# Patient Record
Sex: Male | Born: 1970 | Race: White | Hispanic: No | Marital: Married | Smoking: Never smoker
Health system: Southern US, Community
[De-identification: ages and names within clinical notes are randomized; demographics above are authoritative.]

## PROBLEM LIST (undated history)

## (undated) DIAGNOSIS — C801 Malignant (primary) neoplasm, unspecified: Secondary | ICD-10-CM

## (undated) DIAGNOSIS — E119 Type 2 diabetes mellitus without complications: Secondary | ICD-10-CM

## (undated) HISTORY — DX: Malignant (primary) neoplasm, unspecified: C80.1

## (undated) HISTORY — PX: OTHER SURGICAL HISTORY: SHX169

---

## 2013-04-28 ENCOUNTER — Ambulatory Visit: Payer: Self-pay

## 2013-05-05 ENCOUNTER — Ambulatory Visit: Payer: Self-pay

## 2013-05-12 ENCOUNTER — Ambulatory Visit: Payer: Self-pay

## 2014-03-09 ENCOUNTER — Ambulatory Visit: Payer: Self-pay

## 2014-03-30 ENCOUNTER — Ambulatory Visit: Payer: BC Managed Care – PPO

## 2014-04-06 ENCOUNTER — Ambulatory Visit: Payer: BC Managed Care – PPO

## 2014-04-13 ENCOUNTER — Ambulatory Visit: Payer: BC Managed Care – PPO

## 2017-09-03 ENCOUNTER — Ambulatory Visit: Payer: Self-pay

## 2017-09-26 ENCOUNTER — Ambulatory Visit: Payer: Self-pay

## 2017-10-03 ENCOUNTER — Ambulatory Visit: Payer: Self-pay

## 2017-10-10 ENCOUNTER — Ambulatory Visit: Payer: Self-pay

## 2018-04-18 ENCOUNTER — Encounter: Payer: Self-pay | Admitting: Otolaryngology

## 2018-04-18 ENCOUNTER — Other Ambulatory Visit: Payer: Self-pay | Admitting: Otolaryngology

## 2018-04-18 DIAGNOSIS — R590 Localized enlarged lymph nodes: Secondary | ICD-10-CM

## 2018-04-18 DIAGNOSIS — R599 Enlarged lymph nodes, unspecified: Secondary | ICD-10-CM

## 2018-05-14 DIAGNOSIS — C760 Malignant neoplasm of head, face and neck: Secondary | ICD-10-CM | POA: Insufficient documentation

## 2018-05-16 ENCOUNTER — Other Ambulatory Visit: Payer: Self-pay | Admitting: Otolaryngology

## 2018-05-16 ENCOUNTER — Other Ambulatory Visit (HOSPITAL_COMMUNITY): Payer: Self-pay | Admitting: Otolaryngology

## 2018-05-16 DIAGNOSIS — R590 Localized enlarged lymph nodes: Secondary | ICD-10-CM

## 2018-05-16 DIAGNOSIS — C770 Secondary and unspecified malignant neoplasm of lymph nodes of head, face and neck: Secondary | ICD-10-CM

## 2018-05-19 ENCOUNTER — Encounter: Payer: Self-pay | Admitting: *Deleted

## 2018-05-23 ENCOUNTER — Telehealth: Payer: Self-pay | Admitting: *Deleted

## 2018-05-23 ENCOUNTER — Ambulatory Visit
Admission: RE | Admit: 2018-05-23 | Discharge: 2018-05-23 | Disposition: A | Discharge status: Home / Self Care | Payer: Self-pay | Source: Ambulatory Visit | Attending: Radiation Oncology | Admitting: Radiation Oncology

## 2018-05-23 DIAGNOSIS — C779 Secondary and unspecified malignant neoplasm of lymph node, unspecified: Secondary | ICD-10-CM

## 2018-05-23 NOTE — Telephone Encounter (Signed)
Oncology Nurse Navigator Documentation  Faxed request to [facility] for the following imaging to be pushed to [employer], notification of successful fax transmission received. . 04/24/2018 CT Neck/Thyroid W Contrast Order placed for imaging to be assigned to [facility] timeline.  Gayleen Orem, RN, BSN Head & Neck Oncology Nurse [place] at [place] [phone]

## 2018-05-23 NOTE — Telephone Encounter (Signed)
Oncology Nurse Navigator Documentation  In follow-up to 3/2 referral received from ENT [name], placed navigator introductory call.  LVMMs for pt and wife requesting return call.  Gayleen Orem, RN, BSN Head & Neck Oncology Nurse [place] at [place] [phone]

## 2018-05-27 ENCOUNTER — Encounter: Payer: Self-pay | Admitting: Radiation Oncology

## 2018-05-27 NOTE — Progress Notes (Signed)
Head and Neck Cancer Location of Tumor / Histology:  RECEIVED: 05/07/2018  ORDERING PHYSICIAN: JEFRY H ROSEN , MD  PATIENT NAME: Albert Flowers, Albert Flowers  CYTOLOGY REPORT    Final Cytologic Interpretation  A. Right IJ chain Lymph node, Fine Needle Aspiration II (smears,  cell block):  Squamous cell carcinoma, metastatic.     Specimen Adequacy: Satisfactory for evaluation.  B. Cytology core biopsy:  Squamous cell carcinoma, metastatic. See comment.      Specimen Adequacy: Satisfactory for evaluation.    COMMENT:Immunohistochemical stains are performed on the core  biopsy. The neoplastic cells are positive for p63 and p16.   Patient presented months ago with symptoms of: He noted a right sided neck lump beginning early January.   FNA of Right IJ chain lymph node revealed: Squamous cell carcinoma  Nutrition Status Yes No Comments  Weight changes? []  [x]    Swallowing concerns? []  [x]    PEG? []  [x]     Referrals Yes No Comments  Social Work? []  [x]    Dentistry? [x]  []  Dr. Enrique Sack 05/29/18  Swallowing therapy? []  [x]    Nutrition? []  [x]    Med/Onc? [x]  []  Dr. Maylon Peppers 06/04/18   Safety Issues Yes No Comments  Prior radiation? []  [x]    Pacemaker/ICD? []  [x]    Possible current pregnancy? []  [x]    Is the patient on methotrexate? []  [x]     Tobacco/Marijuana/Snuff/ETOH use: He has never smoked. He drinks alcohol occasionally.   Past/Anticipated interventions by otolaryngology, if any:  05/15/18 Dr. Constance Holster Metastatic squamous cell lymph node. Recommend PET scan, evaluation at the cancer center by radiation and medical oncology and dental medicine. Depending on the findings of the PET scan, it is possible that robotic surgery might be indicated in which case we will send him over to [facility].  Electronically signed by: Beckie Salts, MD 05/15/18 0920   Past/Anticipated interventions by medical oncology, if any:  Dr. Maylon Peppers 06/04/18.    Current Complaints / other  details:   05/28/18 PET  BP 123/87 (BP Location: Left Arm, Patient Position: Sitting)   Pulse 76   Temp 98.1 F (36.7 C) (Oral)   Resp 18   Ht 5\' 10"  (1.778 m)   Wt 196 lb (88.9 kg)   SpO2 97%   BMI 28.12 kg/m    Wt Readings from Last 3 Encounters:  05/30/18 196 lb (88.9 kg)

## 2018-05-28 ENCOUNTER — Encounter (HOSPITAL_COMMUNITY): Payer: Self-pay

## 2018-05-28 ENCOUNTER — Encounter (HOSPITAL_COMMUNITY)
Admission: RE | Admit: 2018-05-28 | Discharge: 2018-05-28 | Disposition: A | Discharge status: Home / Self Care | Payer: Managed Care, Other (non HMO) | Source: Ambulatory Visit | Attending: Otolaryngology | Admitting: Otolaryngology

## 2018-05-28 ENCOUNTER — Other Ambulatory Visit: Payer: Self-pay

## 2018-05-28 DIAGNOSIS — R590 Localized enlarged lymph nodes: Secondary | ICD-10-CM

## 2018-05-28 DIAGNOSIS — C770 Secondary and unspecified malignant neoplasm of lymph nodes of head, face and neck: Secondary | ICD-10-CM | POA: Diagnosis present

## 2018-05-28 LAB — GLUCOSE, CAPILLARY: Glucose-Capillary: 162 mg/dL — ABNORMAL HIGH (ref 70–99)

## 2018-05-28 IMAGING — CT NUCLEAR MEDICINE PET IMAGE INITIAL (PI) SKULL BASE TO THIGH
7 series · 16 of 16 positions shown · non-contrast
Comparison: None.

CLINICAL DATA: Initial treatment strategy for head neck carcinoma.
Cervical lymphadenopathy. HPV positive squamous cell carcinoma.

EXAM:
NUCLEAR MEDICINE PET SKULL BASE TO THIGH
TECHNIQUE: 10.6 mCi F-18 FDG was injected intravenously. Full-ring PET imaging
was performed from the skull base to thigh after the radiotracer. CT
data was obtained and used for attenuation correction and anatomic
localization.
Fasting blood glucose: 162 mg/dl

[Series 3: pet hn_sk_thigh ac · axial · 5.0mm · 4.07mm/px · z∈[-818,+150]mm · 4 of 243 slices shown]
[im 1/243]
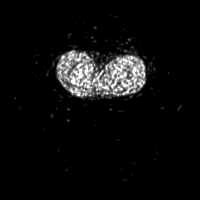
[im 81/243]
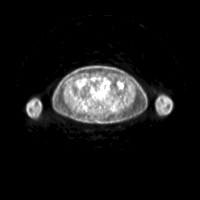
[im 162/243]
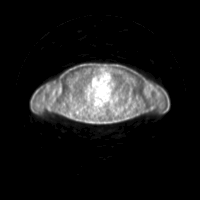
[im 243/243]
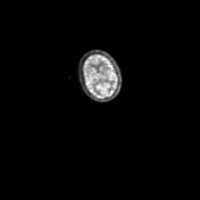

[Series 4: ct hn_sk_th 5.0 hd_fov · axial · 1.17mm/px · z∈[-818,+150]mm · 3 of 243 slices shown]
[im 1/243  soft-tissue]
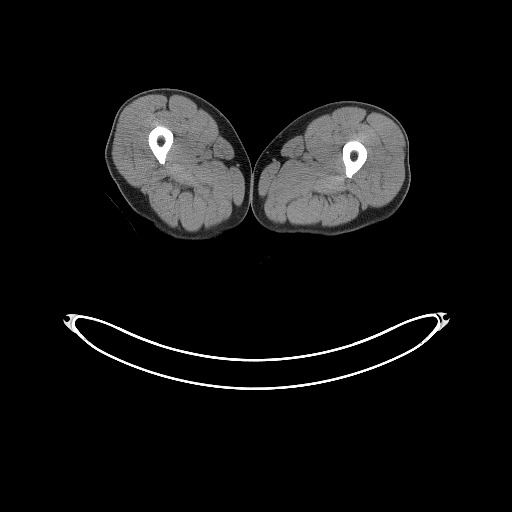
[im 122/243  soft-tissue]
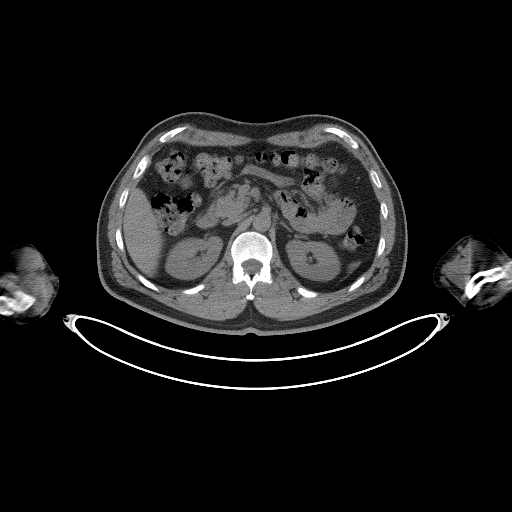
[im 243/243  soft-tissue]
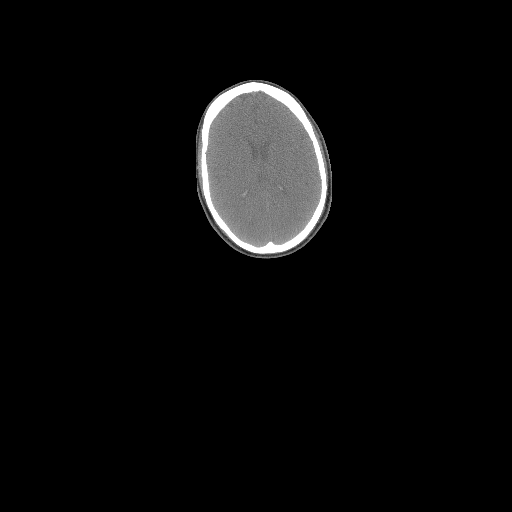

[Series 5: pet hn_sk_thigh nac · axial · 5.0mm · 4.07mm/px · z∈[-818,+150]mm · 3 of 243 slices shown]
[im 1/243]
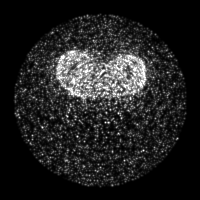
[im 122/243]
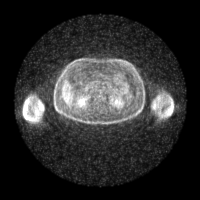
[im 243/243]
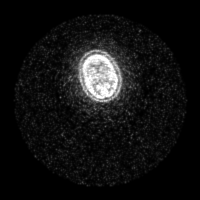

[Series 8: ct hn_sk_th 5.0 b70f lung_bone · axial · 0.66mm/px · 1 of 61 slices shown]
[im 1/61  soft-tissue]
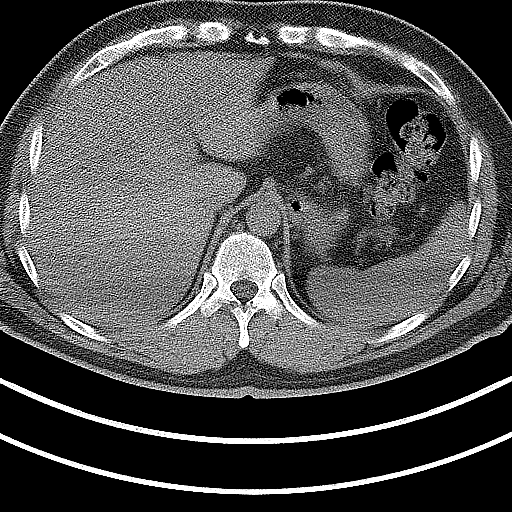

[Series 603: mip range · coronal · 2.01mm/px · 1 of 32 slices shown]
[im 1/32]
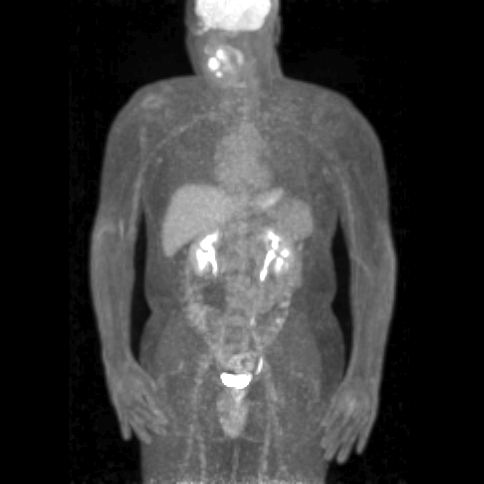

[Series 604: range-ct hn_sk_th 5.0 hd_fov-cor-<alpha range> · 1 of 78 slices shown]
[im 1/78]
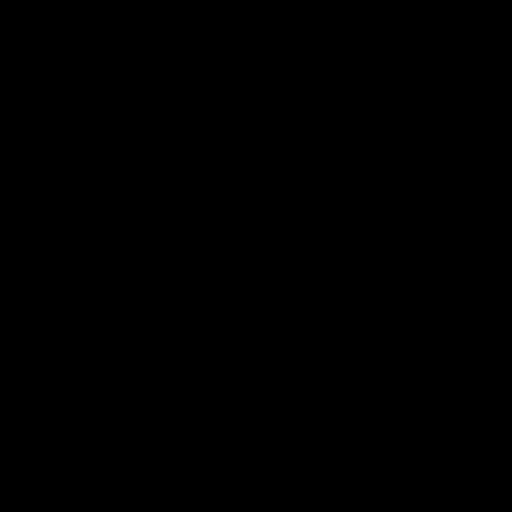

[Series 605: range-ct hn_sk_th 5.0 hd_fov-tra-<alpha range> · 3 of 232 slices shown]
[im 1/232]
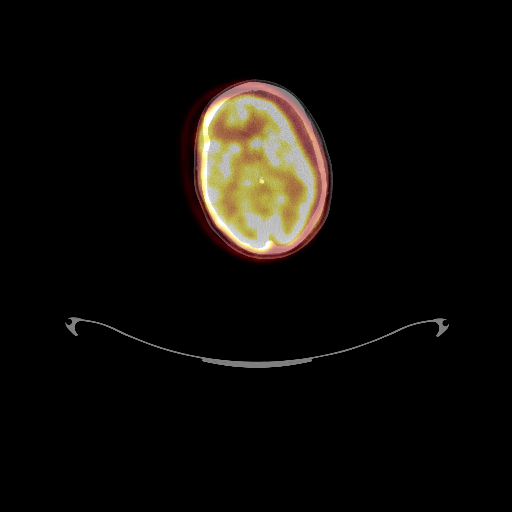
[im 116/232]
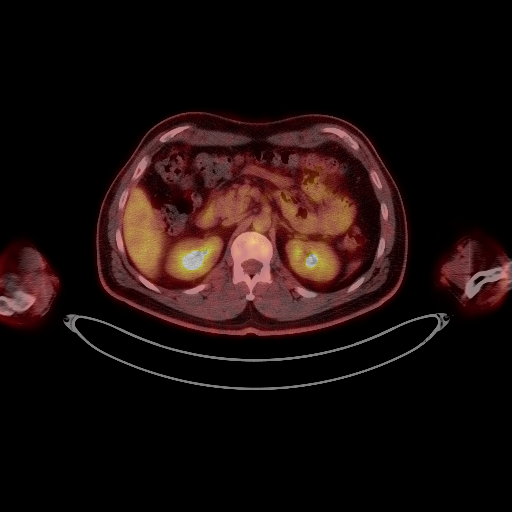
[im 232/232]
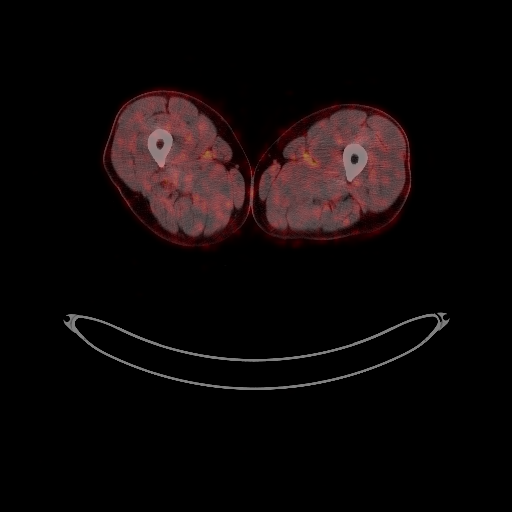

[16 of 16 positions shown; findings below may reference images not displayed]

FINDINGS: Mediastinal blood pool activity: SUV max

NECK: Head motion results in misregistration of the PET data and CT
data. Images adequate for diagnostic interpretation

Hypermetabolic activity (SUV max equal 9.2 in the hypopharynx
appears to localize to the RIGHT lingual tonsil/base of tongue.
Misregistration makes localization difficult. Activity appears
localized to the pharyngeal mucosa.

Mild metabolic activity at similar location in the LEFT hypopharynx
with with SUV max equal 4.6.

Hypermetabolic RIGHT level 2 lymph node anterior to
sternocleidomastoid muscle with SUV max equal 9.6 measures 19 mm
(image 19 mm image 33/4).

Second slightly inferior hypermetabolic RIGHT level 2 lymph node
measures 12 mm on image 37/4).

Incidental CT findings: none

CHEST: No hypermetabolic mediastinal or hilar nodes. No suspicious
pulmonary nodules on the CT scan.

Incidental CT findings: Calcified RIGHT hilar lymph node

ABDOMEN/PELVIS: No abnormal hypermetabolic activity within the
liver, pancreas, adrenal glands, or spleen. No hypermetabolic lymph
nodes in the abdomen or pelvis.

Incidental CT findings: Prostate normal.

SKELETON: No focal hypermetabolic activity to suggest skeletal
metastasis.

Incidental CT findings: none
IMPRESSION: 1. Asymmetric hypermetabolic activity in the RIGHT lingular
tonsil/base of tongue region favored primary carcinoma.
2. Two hypermetabolic RIGHT level II metastatic lymph nodes.
3. No LEFT cervical lymphadenopathy.  No distant metastatic disease.

## 2018-05-28 MED ORDER — FLUDEOXYGLUCOSE F - 18 (FDG) INJECTION
10.6000 | Freq: Once | INTRAVENOUS | Status: AC | PRN
  Administered 2018-05-28: 10.6 via INTRAVENOUS

## 2018-05-29 ENCOUNTER — Ambulatory Visit (HOSPITAL_COMMUNITY): Payer: Self-pay | Admitting: Dentistry

## 2018-05-29 ENCOUNTER — Encounter (HOSPITAL_COMMUNITY): Payer: Self-pay | Admitting: Dentistry

## 2018-05-29 VITALS — BP 113/77 | HR 59 | Temp 98.1°F

## 2018-05-29 DIAGNOSIS — M264 Malocclusion, unspecified: Secondary | ICD-10-CM

## 2018-05-29 DIAGNOSIS — Z01818 Encounter for other preprocedural examination: Secondary | ICD-10-CM

## 2018-05-29 DIAGNOSIS — K0530 Chronic periodontitis, unspecified: Secondary | ICD-10-CM

## 2018-05-29 DIAGNOSIS — K036 Deposits [accretions] on teeth: Secondary | ICD-10-CM

## 2018-05-29 DIAGNOSIS — K08409 Partial loss of teeth, unspecified cause, unspecified class: Secondary | ICD-10-CM

## 2018-05-29 DIAGNOSIS — C760 Malignant neoplasm of head, face and neck: Secondary | ICD-10-CM

## 2018-05-29 DIAGNOSIS — M2624 Reverse articulation: Secondary | ICD-10-CM

## 2018-05-29 DIAGNOSIS — K031 Abrasion of teeth: Secondary | ICD-10-CM

## 2018-05-29 DIAGNOSIS — K06010 Localized gingival recession, unspecified: Secondary | ICD-10-CM

## 2018-05-29 MED ORDER — SODIUM FLUORIDE 1.1 % DT CREA: TOPICAL_CREAM | DENTAL | 99 refills | Status: DC

## 2018-05-29 NOTE — Patient Instructions (Signed)

## 2018-05-29 NOTE — Progress Notes (Signed)
Radiation Oncology         [phone]) [phone] ________________________________  Initial outpatient Consultation  Name: Albert Flowers MRN: [id number]  Date: 05/30/2018  DOB: 1970-07-15  VO:JJKK, Edwyna Shell, MD  [name], MD   REFERRING PHYSICIAN: [name], MD  DIAGNOSIS:    ICD-10-CM   1. 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 male who presented with several months ago with right sided neck lump that he noted in early January 2020.  Subsequently, the patient saw Dr. Constance Holster who performed FNA and recommended the following: PET scan, radiation oncology consult, medical oncology consult, and dental medicine consult.   Fine needle aspiration on 05/07/2018 revealed: Right IJ chain lymph node, fine needle aspiration with squamous cell carcinoma, metastatic. Cytology or core biopsy with squamous cell carcinoma, metastatic. Immunochemical stains showed neoplastic cells position for p63 and p16.   Pertinent imaging thus far includes CT Neck with contrast performed on 04/24/2018 revealing: Right level 2 necrotic nodal mass and level 3 necrotic lymph node are highly concerning for nodal metastasis from head and neck primary neoplasm likely oropharynx or the oral cavity. Mild asymmetry and fullness of right nasopharyngeal region, nonspecific. 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   Additionally, he had a PET scan on 05/28/2018 showed: Asymmetric hypermetabolic activity in the RIGHT lingular  tonsil/base of tongue region favored primary carcinoma. Two hypermetabolic RIGHT level II metastatic lymph nodes. No LEFT cervical lymphadenopathy. No distant metastatic disease.  I have personally reviewed his imaging.  I also shared his imaging with Dr. [name] by video conferencing  Swallowing issues, if any: No Weight Changes: No  Tobacco history, if any: He has never smoked cigarettes  ETOH abuse, if any: He consumes alcohol occasionally.    PREVIOUS RADIATION THERAPY: No  PAST MEDICAL HISTORY:  has a past medical history of Diabetes mellitus without complication ([place]).    PAST SURGICAL HISTORY: Past Surgical History:  Procedure Laterality Date   neck sugery     as a child "had a knot removed from neck" not sure which side.     FAMILY HISTORY:  No related family history reported   SOCIAL HISTORY:  reports that he has never smoked. He has never used smokeless tobacco. He reports current alcohol use. He reports that he does not use drugs.  ALLERGIES: Penicillins  MEDICATIONS:  Current Outpatient Medications  Medication Sig Dispense Refill   aspirin EC 81 MG tablet Take 81 mg by mouth daily.     metFORMIN (GLUCOPHAGE) 1000 MG tablet Take by mouth.     rosuvastatin (CRESTOR) 20 MG tablet      sitaGLIPtin (JANUVIA) 100 MG tablet      sodium fluoride (PREVIDENT 5000 PLUS) 1.1 % CREA dental cream Apply to tooth brush. Brush teeth for 2 minutes. Spit out excess. DO NOT rinse afterwards. Repeat nightly. (Patient not taking: Reported on 05/30/2018) 1 Tube prn   No current facility-administered medications for this encounter.     REVIEW OF SYSTEMS:  A 10+ POINT REVIEW OF SYSTEMS WAS OBTAINED including neurology, dermatology, psychiatry, cardiac, respiratory, lymph, extremities, GI, GU, Musculoskeletal, constitutional, breasts, reproductive, HEENT.  All pertinent positives are noted in the HPI.  All others are negative.   PHYSICAL EXAM:  height is 5\' 10"  (1.778 m) and  weight is 196 lb (88.9 kg). His oral temperature is 98.1 F (36.7 C). His blood pressure is 123/87 and his pulse is 76. His respiration is 18 and oxygen saturation is 97%.   General: Alert and oriented, in no acute distress HEENT: Head is normocephalic. Extraocular movements are intact. Oropharynx is notable for no obvious lesions Neck: Neck is notable for palpable mass in level 2/3 of right neck, approximately 4 cm in greatest dimension heart: Regular in rate and rhythm with no murmurs, rubs, or gallops. Chest: Clear to auscultation bilaterally, with no rhonchi, wheezes, or rales. Abdomen: Soft, nontender, nondistended, with no rigidity or guarding. Extremities: No cyanosis or edema. Lymphatics: see Neck Exam Skin: No concerning lesions. Musculoskeletal: symmetric strength and muscle tone throughout. Neurologic: Cranial nerves II through XII are grossly intact. No obvious focalities. Speech is fluent. Coordination is intact. Psychiatric: Judgment and insight are intact. Affect is appropriate.  PROCEDURE NOTE: After obtaining consent and anesthetizing the nasal cavity with topical lidocaine and phenylephrine, the flexible endoscope was introduced and passed through the nasal cavity.  No obvious lesions or masses appreciated in the pharynx or the larynx.  His true cords are symmetrically mobile with no nodules.  ECOG = 0  0 - Asymptomatic (Fully active, able to carry on all predisease activities without restriction)  1 - Symptomatic but completely ambulatory (Restricted in physically strenuous activity but ambulatory and able to carry out work of a light or sedentary nature. For example, light housework, office work)  2 - Symptomatic, <50% in bed during the day (Ambulatory and capable of all self care but unable to carry out any work activities. Up and about more than 50% of waking hours)  3 - Symptomatic, >50% in bed, but not bedbound (Capable of only limited self-care, confined to bed or  chair 50% or more of waking hours)  4 - Bedbound (Completely disabled. Cannot carry on any self-care. Totally confined to bed or chair)  5 - Death   Eustace Pen MM, Creech RH, Tormey DC, et al. [phone]). "Toxicity and response criteria of the [facility]". [place] Oncol. 5 (6): 649-55   LABORATORY DATA:  No results found for: WBC, HGB, HCT, MCV, PLT CMP  No results found for: NA, K, CL, CO2, GLUCOSE, BUN, CREATININE, CALCIUM, PROT, ALBUMIN, AST, ALT, ALKPHOS, BILITOT, GFRNONAA, GFRAA    No results found for: TSH   RADIOGRAPHY: Nm Pet Image Initial (pi) Skull Base To Thigh  Result Date: 05/28/2018 CLINICAL DATA:  Initial treatment strategy for head neck carcinoma. Cervical lymphadenopathy. HPV positive squamous cell carcinoma. EXAM: NUCLEAR MEDICINE PET SKULL BASE TO THIGH TECHNIQUE: 10.6 mCi F-18 FDG was injected intravenously. Full-ring PET imaging was performed from the skull base to thigh after the radiotracer. CT data was obtained and used for attenuation correction and anatomic localization. Fasting blood glucose: 162 mg/dl COMPARISON:  None. FINDINGS: Mediastinal blood pool activity: SUV max 2.25 NECK: Head motion results in misregistration of the PET data and CT data. Images adequate for diagnostic interpretation Hypermetabolic activity (SUV max equal 9.2 in the hypopharynx appears to localize to the RIGHT lingual tonsil/base of tongue. Misregistration makes localization difficult. Activity appears localized to the pharyngeal mucosa.  Mild metabolic activity at similar location in the LEFT hypopharynx with with SUV max equal 4.6. Hypermetabolic RIGHT level 2 lymph node anterior to sternocleidomastoid muscle with SUV max equal 9.6 measures 19 mm (image 19 mm image 33/4). Second slightly inferior hypermetabolic RIGHT level 2 lymph node measures 12 mm on image 37/4). Incidental CT findings: none CHEST: No hypermetabolic mediastinal or hilar nodes. No suspicious pulmonary  nodules on the CT scan. Incidental CT findings: Calcified RIGHT hilar lymph node ABDOMEN/PELVIS: No abnormal hypermetabolic activity within the liver, pancreas, adrenal glands, or spleen. No hypermetabolic lymph nodes in the abdomen or pelvis. Incidental CT findings: Prostate normal. SKELETON: No focal hypermetabolic activity to suggest skeletal metastasis. Incidental CT findings: none IMPRESSION: 1. Asymmetric hypermetabolic activity in the RIGHT lingular tonsil/base of tongue region favored primary carcinoma. 2. Two hypermetabolic RIGHT level II metastatic lymph nodes. 3. No LEFT cervical lymphadenopathy.  No distant metastatic disease. Electronically Signed   By: Suzy Bouchard M.D.   On: 05/28/2018 11:10      IMPRESSION/PLAN: I discussed this patient's case with Dr. [name].  He and I agree there is a very very high likelihood that the patient will need adjuvant radiation if he were to undergo TORS.  There is about a 25 to 33% chance that he would also need adjuvant chemotherapy.  However by undergoing surgery there is a greater likelihood that he could avoid chemotherapy as part of his treatment.  Alternatively, he would have a very good chance of cure with concurrent chemoradiotherapy.  The patient will see him for surgical opinion in about 1 week.    We discussed the potential risks, benefits, and side effects of radiotherapy which in either treatment plan would last 6 to 7 weeks. We talked in detail about acute and late effects. We discussed that some of the most bothersome acute effects may be mucositis, dysgeusia, salivary changes, skin irritation, hair loss, dehydration, weight loss and fatigue. We talked about late effects which include but are not necessarily limited to dysphagia, hypothyroidism, nerve injury, spinal cord injury, xerostomia, trismus, permanent tissue injury and neck edema. No guarantees of treatment were given. A consent form was signed and placed in the patient's medical  record. The patient is enthusiastic about proceeding with treatment. I look forward to participating in the patient's care.    Simulation (treatment planning) will take place after his disposition is determined by otolaryngology  We also discussed that the treatment of head and neck cancer is a multidisciplinary process to maximize treatment outcomes and quality of life. For this reason the following referrals have been or will be made:   Medical oncology to discuss chemotherapy   Dentistry for dental evaluation, possible extractions in the radiation fields, and /or advice on reducing risk of cavities, osteoradionecrosis, or other oral issues. Patient followed up with Dr. Enrique Sack on 05/29/2018.    Nutritionist for nutrition support during and after treatment.   Speech language pathology for swallowing and/or speech therapy.   Social work for social support.    Physical therapy due to risk of lymphedema in neck and deconditioning.   Baseline labs including TSH. .  __________________________________________   [name], MD   This document serves as a record of services personally performed by [name], MD. It was created on her behalf by Steva Colder, a trained medical scribe. The creation of this record is based on the scribe's personal observations and the provider's statements to them. This document has been checked and approved by the  attending provider.
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
## 2018-05-30 ENCOUNTER — Ambulatory Visit
Admission: RE | Admit: 2018-05-30 | Discharge: 2018-05-30 | Disposition: A | Discharge status: Home / Self Care | Payer: Managed Care, Other (non HMO) | Source: Ambulatory Visit | Attending: Radiation Oncology | Admitting: Radiation Oncology

## 2018-05-30 ENCOUNTER — Other Ambulatory Visit: Payer: Self-pay

## 2018-05-30 ENCOUNTER — Encounter: Payer: Self-pay | Admitting: *Deleted

## 2018-05-30 ENCOUNTER — Encounter: Payer: Self-pay | Admitting: Radiation Oncology

## 2018-05-30 VITALS — BP 123/87 | HR 76 | Temp 98.1°F | Resp 18 | Ht 70.0 in | Wt 196.0 lb

## 2018-05-30 DIAGNOSIS — E119 Type 2 diabetes mellitus without complications: Secondary | ICD-10-CM | POA: Insufficient documentation

## 2018-05-30 DIAGNOSIS — C090 Malignant neoplasm of tonsillar fossa: Secondary | ICD-10-CM | POA: Diagnosis not present

## 2018-05-30 DIAGNOSIS — Z7984 Long term (current) use of oral hypoglycemic drugs: Secondary | ICD-10-CM | POA: Insufficient documentation

## 2018-05-30 DIAGNOSIS — Z79899 Other long term (current) drug therapy: Secondary | ICD-10-CM | POA: Diagnosis not present

## 2018-05-30 DIAGNOSIS — Z7982 Long term (current) use of aspirin: Secondary | ICD-10-CM | POA: Insufficient documentation

## 2018-05-30 DIAGNOSIS — C760 Malignant neoplasm of head, face and neck: Secondary | ICD-10-CM

## 2018-05-30 DIAGNOSIS — C01 Malignant neoplasm of base of tongue: Secondary | ICD-10-CM

## 2018-05-30 DIAGNOSIS — Q892 Congenital malformations of other endocrine glands: Secondary | ICD-10-CM | POA: Insufficient documentation

## 2018-05-30 HISTORY — DX: Type 2 diabetes mellitus without complications: E11.9

## 2018-05-30 MED ORDER — LARYNGOSCOPY SOLUTION RAD-ONC
15.0000 mL | Freq: Once | TOPICAL | Status: AC
  Administered 2018-05-30: 15 mL via TOPICAL
  Filled 2018-05-30: qty 15

## 2018-05-31 NOTE — Progress Notes (Signed)
Oncology Nurse Navigator Documentation  Met with Mr. Albert Flowers during initial consult with Dr. Isidore Moos.  He was accompanied by his wife.    . Further introduced myself as his Navigator, explained my role as a member of the Care Team.   . Provided New Patient Information packet, discussed contents: o Contact information for physician(s), myself, other members of the Care Team. o Advance Directive information ([place] blue pamphlet with LCSW contact info); provided [facility] AD booklet at his request, encouraged him to contact Gwinda [place] LCSW to complete. o Fall Prevention Patient Safety Plan o Appointment Guideline o [place] [facility] [place] [facility] map with highlight of [place] o SLP information sheet o Symptom Management Clinic information . Provided introductory explanation of radiation treatment including SIM planning and purpose of Aquaplast head and shoulder mask, showed them example.   . Provided and discussed education handouts for PEG and PAC.  Marland Kitchen Provided tour of SIM and [place] areas, explained treatment and arrival procedures. . Discussed attendance at future H&[facility] [place] to meet with support team. . Of note: . Per appt with Dr. Enrique Sack yesterday, no extractions needed. . Per Dr. Pearlie Oyster conversation with Dr. [name], [facility], he may be TORS candidate, can be seen end of next week.  Pt voiced understanding I will coordinate appt. . I encouraged them to contact me with questions/concerns as treatments/procedures begin.  They verbalized understanding of information provided.    Gayleen Orem, RN, BSN Head & Neck Oncology Nurse [place] [facility] [place] [phone]

## 2018-06-02 ENCOUNTER — Encounter: Payer: Self-pay | Admitting: Radiation Oncology

## 2018-06-02 ENCOUNTER — Other Ambulatory Visit: Payer: Self-pay | Admitting: Radiation Oncology

## 2018-06-02 DIAGNOSIS — C01 Malignant neoplasm of base of tongue: Secondary | ICD-10-CM

## 2018-06-02 DIAGNOSIS — Z1329 Encounter for screening for other suspected endocrine disorder: Secondary | ICD-10-CM

## 2018-06-02 HISTORY — DX: Malignant neoplasm of base of tongue: C01

## 2018-06-02 NOTE — Progress Notes (Signed)
Albert Flowers NOTE  Patient Care Team: [name], MD as PCP - General (Family Medicine) [name], MD as Consulting Physician (Otolaryngology) [name], MD as Attending Physician (Radiation Oncology) Leota Sauers, RN as Registered Nurse  HEME/ONC OVERVIEW: 1. 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 Constance Holster of ENT in late 03/2018 for evaluation of an enlarging right neck mass.  CT neck showed necrotic right Level II and III necrotic LN's, possibly from oropharyngeal oral cavity primary, but the study was limited due to streak artifacts.  He underwent FNA of the R Level II LN, which showed squamous cell carcinoma, p16+.  PET in 05/2018 showed asymmetric FDG uptake in the right tonsil/base of the tongue, likely the primary malignancy.  In addition, there were two FDG-avid R Level II LN's without evidence of contralateral cervical LN or metastatic disease. -I reviewed the imaging and biopsy results in detail with the patient -I also reviewed NCCN guidelines in detail with the patient, including the rationale for surgical resection, chemotherapy and radiation -Furthermore, I discussed with the patient and his spouse regarding some of the chemotherapy  regimen, their frequency of administration, and some of the potential side effects, either in the definitive or adjuvant setting  -Given the relatively low volume disease, I would favor upfront surgery, followed by adjuvant therapy based on the final pathology; if the margins are negative and there is no extracapsular extension, the patient may be able to avoid adjuvant chemotherapy -However, if the patient is not a candidate for upfront resection, then definitive chemoradiation with cisplatin (likely high-dose) would be a reasonable approach -I discussed with the patient and his spouse at length regarding the plan, and they expressed understanding -Patient is currently scheduled to meet with Dr. Nat Christen at [facility] on 06/10/2018 -Pending his evaluation at ENT, we will coordinate the next step of plan   A total of more than 60 minutes were spent face-to-face with the patient during this encounter and over half of that time was spent on counseling and coordination of care as outlined above.    All questions were answered. The patient knows to call the clinic with any problems, questions or concerns.  Return to clinic appt to be determined, pending ENT evaluation at [place], MD 06/04/2018 2:35 PM   CHIEF COMPLAINTS/PURPOSE OF CONSULTATION:  "I am here for cancer in my neck"  HISTORY OF PRESENTING ILLNESS:  Albert Flowers 48 y.o. male is here because of newly diagnosed squamous cell carcinoma of the head and neck, favoring right oropharyngeal primary.  Patient reports that in mid 02/2018, he noticed a lump in the right side of his neck.  It was nontender, and not associated with any constitutional symptoms, such as fever, night sweats, or unexplained weight loss.  He presented to his PCP, who prescribed antibiotics, but it did not help with the neck swelling.  He then was referred to ENT for further evaluation.  CT neck showed enlarged necrotic right level 2 and 3 lymph nodes, but  no primary was identified.  He underwent FNA of the cervical lymph node, which showed squamous cell carcinoma, p16+.  He then underwent PET scan, which showed possible primary site at the right tonsil/base of the tongue.  He was referred to medical oncology for further evaluation.  Patient reports that since he first noticed a lump in the neck, the lump has not changed in size or become tender.  He denies any other complaint today.  He denies any history of tobacco use.  I have reviewed his chart and materials related to his cancer extensively and collaborated history with the patient. Summary of oncologic history is as follows:   Head and neck cancer ([place])   04/24/2018 Imaging    CT neck w/ contrast: 1. Right level 2 necrotic nodal mass and level 3 necrotic lymph node are highly concerning for nodal metastasis from head and neck primary neoplasm likely oropharynx or the oral cavity. However, evaluation for primary neoplasm in these regions is markedly limited due to streak artifacts from dental amalgam and closed airway possibly secondary to patient's holding breath/swallowing. Recommend direct visual inspection and possibly PET CT scan for further evaluation.  2. Mild asymmetry and fullness of the right nasopharyngeal region, nonspecific.  3. 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    05/07/2018 Procedure    FNA of the R IJ Level II LN     05/07/2018 Pathology Results    ACCESSION NUMBER: [id number]  Right IJ chain lymph node, FNA Squamous cell carcinoma, metastatic; p16+     05/28/2018 Imaging    PET: IMPRESSION: 1. Asymmetric hypermetabolic activity in the RIGHT lingular tonsil/base of tongue region favored primary carcinoma. 2. Two hypermetabolic RIGHT level II metastatic lymph nodes. 3. No LEFT cervical lymphadenopathy.  No distant metastatic disease.     Malignant neoplasm of base of tongue ([place])   06/02/2018 Initial Diagnosis    Malignant neoplasm of base of tongue  ([place])    06/02/2018 Cancer Staging    Staging form: Pharynx - HPV-Mediated Oropharynx, AJCC 8th Edition - Clinical: Stage I (cT2, cN1, cM0, p16+) - Signed by [name], MD on 06/02/2018     MEDICAL HISTORY:  Past Medical History:  Diagnosis Date  . Diabetes mellitus without complication ([place])     SURGICAL HISTORY: Past Surgical History:  Procedure Laterality Date  . neck sugery     as a child "had a knot removed from neck" not sure which side.     SOCIAL HISTORY: Social History   Socioeconomic History  . Marital status: Married    Spouse name: Not on file  . Number of children: 4  . Years of education: Not on file  . Highest education level: Not on file  Occupational History  . Not on file  Social Needs  . Financial resource strain: Not on file  . Food insecurity:    Worry: Not on file    Inability: Not on file  . Transportation needs:    Medical: No    Non-medical: No  Tobacco Use  . Smoking status: Never Smoker  . Smokeless tobacco: Never Used  Substance and Sexual Activity  . Alcohol use: Yes    Comment: occasional  . Drug use: Never  .  Sexual activity: Not on file  Lifestyle  . Physical activity:    Days per week: Not on file    Minutes per session: Not on file  . Stress: Not on file  Relationships  . Social connections:    Talks on phone: Not on file    Gets together: Not on file    Attends religious service: Not on file    Active member of club or organization: Not on file    Attends meetings of clubs or organizations: Not on file    Relationship status: Not on file  . Intimate partner violence:    Fear of current or ex partner: No    Emotionally abused: No    Physically abused: No    Forced sexual activity: No  Other Topics Concern  . Not on file  Social History Narrative  . Not on file    FAMILY HISTORY: No family history on file.  ALLERGIES:  is allergic to penicillins.  MEDICATIONS:  Current Outpatient Medications  Medication  Sig Dispense Refill  . Semaglutide (RYBELSUS) 3 MG TABS Take 3 mg by mouth every morning.    Marland Kitchen aspirin EC 81 MG tablet Take 81 mg by mouth daily.    . metFORMIN (GLUCOPHAGE) 1000 MG tablet Take by mouth.    . rosuvastatin (CRESTOR) 20 MG tablet     . sodium fluoride (PREVIDENT 5000 PLUS) 1.1 % CREA dental cream Apply to tooth brush. Brush teeth for 2 minutes. Spit out excess. DO NOT rinse afterwards. Repeat nightly. (Patient not taking: Reported on 05/30/2018) 1 Tube prn   No current facility-administered medications for this visit.     REVIEW OF SYSTEMS:   Constitutional: ( - ) fevers, ( - )  chills , ( - ) night sweats Eyes: ( - ) blurriness of vision, ( - ) double vision, ( - ) watery eyes Ears, nose, mouth, throat, and face: ( - ) mucositis, ( - ) sore throat Respiratory: ( - ) cough, ( - ) dyspnea, ( - ) wheezes Cardiovascular: ( - ) palpitation, ( - ) chest discomfort, ( - ) lower extremity swelling Gastrointestinal:  ( - ) nausea, ( - ) heartburn, ( - ) change in bowel habits Skin: ( - ) abnormal skin rashes Lymphatics: ( - ) new lymphadenopathy, ( - ) easy bruising Neurological: ( - ) numbness, ( - ) tingling, ( - ) new weaknesses Behavioral/Psych: ( - ) mood change, ( - ) new changes  All other systems were reviewed with the patient and are negative.  PHYSICAL EXAMINATION: ECOG PERFORMANCE STATUS: 0 - Asymptomatic  Vitals:   06/04/18 1346  BP: (!) 138/91  Pulse: 68  Resp: 20  Temp: 97.8 F (36.6 C)  SpO2: 98%   Filed Weights   06/04/18 1346  Weight: 197 lb 1.6 oz (89.4 kg)    GENERAL: alert, no distress and comfortable SKIN: skin color, texture, turgor are normal, no rashes or significant lesions EYES: conjunctiva are pink and non-injected, sclera clear OROPHARYNX: no exudate, no erythema; lips, buccal mucosa, and tongue normal  NECK: supple, non-tender LYMPH:  ~2x2cm right cervical LN at the angle of the jaw, no other palpable lymphadenopathy LUNGS: clear to  auscultation with normal breathing effort HEART: regular rate & rhythm, no murmurs, no lower extremity edema ABDOMEN: soft, non-tender, non-distended, normal bowel sounds Musculoskeletal: no cyanosis of digits and no clubbing  PSYCH: alert & oriented x 3, fluent speech NEURO: no focal motor/sensory deficits  LABORATORY  DATA:  I have reviewed the data as listed No results found for: WBC, HGB, HCT, MCV, PLT No results found for: NA, K, CL, CO2  RADIOGRAPHIC STUDIES: I have personally reviewed the radiological images as listed and agreed with the findings in the report. Nm Pet Image Initial (pi) Skull Base To Thigh  Result Date: 05/28/2018 CLINICAL DATA:  Initial treatment strategy for head neck carcinoma. Cervical lymphadenopathy. HPV positive squamous cell carcinoma. EXAM: NUCLEAR MEDICINE PET SKULL BASE TO THIGH TECHNIQUE: 10.6 mCi F-18 FDG was injected intravenously. Full-ring PET imaging was performed from the skull base to thigh after the radiotracer. CT data was obtained and used for attenuation correction and anatomic localization. Fasting blood glucose: 162 mg/dl COMPARISON:  None. FINDINGS: Mediastinal blood pool activity: SUV max 2.25 NECK: Head motion results in misregistration of the PET data and CT data. Images adequate for diagnostic interpretation Hypermetabolic activity (SUV max equal 9.2 in the hypopharynx appears to localize to the RIGHT lingual tonsil/base of tongue. Misregistration makes localization difficult. Activity appears localized to the pharyngeal mucosa. Mild metabolic activity at similar location in the LEFT hypopharynx with with SUV max equal 4.6. Hypermetabolic RIGHT level 2 lymph node anterior to sternocleidomastoid muscle with SUV max equal 9.6 measures 19 mm (image 19 mm image 33/4). Second slightly inferior hypermetabolic RIGHT level 2 lymph node measures 12 mm on image 37/4). Incidental CT findings: none CHEST: No hypermetabolic mediastinal or hilar nodes. No  suspicious pulmonary nodules on the CT scan. Incidental CT findings: Calcified RIGHT hilar lymph node ABDOMEN/PELVIS: No abnormal hypermetabolic activity within the liver, pancreas, adrenal glands, or spleen. No hypermetabolic lymph nodes in the abdomen or pelvis. Incidental CT findings: Prostate normal. SKELETON: No focal hypermetabolic activity to suggest skeletal metastasis. Incidental CT findings: none IMPRESSION: 1. Asymmetric hypermetabolic activity in the RIGHT lingular tonsil/base of tongue region favored primary carcinoma. 2. Two hypermetabolic RIGHT level II metastatic lymph nodes. 3. No LEFT cervical lymphadenopathy.  No distant metastatic disease. Electronically Signed   By: Suzy Bouchard M.D.   On: 05/28/2018 11:10    PATHOLOGY: I have reviewed the pathology reports as documented in the oncologist history.

## 2018-06-04 ENCOUNTER — Other Ambulatory Visit: Payer: Self-pay

## 2018-06-04 ENCOUNTER — Encounter: Payer: Self-pay | Admitting: Hematology

## 2018-06-04 ENCOUNTER — Telehealth: Payer: Self-pay | Admitting: *Deleted

## 2018-06-04 ENCOUNTER — Inpatient Hospital Stay: Payer: Managed Care, Other (non HMO) | Attending: Hematology | Admitting: Hematology

## 2018-06-04 ENCOUNTER — Ambulatory Visit
Admission: RE | Admit: 2018-06-04 | Discharge: 2018-06-04 | Disposition: A | Discharge status: Home / Self Care | Payer: Managed Care, Other (non HMO) | Source: Ambulatory Visit | Attending: Radiation Oncology | Admitting: Radiation Oncology

## 2018-06-04 VITALS — BP 138/91 | HR 68 | Temp 97.8°F | Resp 20 | Ht 70.0 in | Wt 197.1 lb

## 2018-06-04 DIAGNOSIS — Z801 Family history of malignant neoplasm of trachea, bronchus and lung: Secondary | ICD-10-CM

## 2018-06-04 DIAGNOSIS — C801 Malignant (primary) neoplasm, unspecified: Secondary | ICD-10-CM | POA: Insufficient documentation

## 2018-06-04 DIAGNOSIS — Z7982 Long term (current) use of aspirin: Secondary | ICD-10-CM | POA: Diagnosis not present

## 2018-06-04 DIAGNOSIS — C01 Malignant neoplasm of base of tongue: Secondary | ICD-10-CM

## 2018-06-04 DIAGNOSIS — Z7984 Long term (current) use of oral hypoglycemic drugs: Secondary | ICD-10-CM | POA: Diagnosis not present

## 2018-06-04 DIAGNOSIS — Z1329 Encounter for screening for other suspected endocrine disorder: Secondary | ICD-10-CM

## 2018-06-04 DIAGNOSIS — Z79899 Other long term (current) drug therapy: Secondary | ICD-10-CM | POA: Diagnosis not present

## 2018-06-04 LAB — CBC WITH DIFFERENTIAL/PLATELET
Abs Immature Granulocytes: 0.02 10*3/uL (ref 0.00–0.07)
Basophils Absolute: 0.1 10*3/uL (ref 0.0–0.1)
Basophils Relative: 1 %
Eosinophils Absolute: 0.1 10*3/uL (ref 0.0–0.5)
Eosinophils Relative: 1 %
HCT: 44.3 % (ref 39.0–52.0)
Hemoglobin: 14.6 g/dL (ref 13.0–17.0)
Immature Granulocytes: 0 %
Lymphocytes Relative: 34 %
Lymphs Abs: 1.9 10*3/uL (ref 0.7–4.0)
MCH: 30.7 pg (ref 26.0–34.0)
MCHC: 33 g/dL (ref 30.0–36.0)
MCV: 93.1 fL (ref 80.0–100.0)
MONO ABS: 0.5 10*3/uL (ref 0.1–1.0)
Monocytes Relative: 9 %
Neutro Abs: 3.1 10*3/uL (ref 1.7–7.7)
Neutrophils Relative %: 55 %
Platelets: 276 10*3/uL (ref 150–400)
RBC: 4.76 MIL/uL (ref 4.22–5.81)
RDW: 12.6 % (ref 11.5–15.5)
WBC: 5.7 10*3/uL (ref 4.0–10.5)
nRBC: 0 % (ref 0.0–0.2)

## 2018-06-04 LAB — COMPREHENSIVE METABOLIC PANEL
ALT: 33 U/L (ref 0–44)
AST: 22 U/L (ref 15–41)
Albumin: 4.5 g/dL (ref 3.5–5.0)
Alkaline Phosphatase: 89 U/L (ref 38–126)
Anion gap: 10 (ref 5–15)
BUN: 14 mg/dL (ref 6–20)
CO2: 25 mmol/L (ref 22–32)
Calcium: 9.6 mg/dL (ref 8.9–10.3)
Chloride: 103 mmol/L (ref 98–111)
Creatinine, Ser: 0.97 mg/dL (ref 0.61–1.24)
GFR calc non Af Amer: >60 mL/min (ref >60)
Glucose, Bld: 136 mg/dL — ABNORMAL HIGH (ref 70–99)
Potassium: 4.4 mmol/L (ref 3.5–5.1)
Sodium: 138 mmol/L (ref 135–145)
TOTAL PROTEIN: 7.9 g/dL (ref 6.5–8.1)
Total Bilirubin: 0.5 mg/dL (ref 0.3–1.2)

## 2018-06-04 LAB — TSH: TSH: 1.621 u[IU]/mL (ref 0.320–4.118)

## 2018-06-04 LAB — T4, FREE: Free T4: 0.71 ng/dL — ABNORMAL LOW (ref 0.82–1.77)

## 2018-06-04 LAB — MAGNESIUM: Magnesium: 1.8 mg/dL (ref 1.7–2.4)

## 2018-06-04 NOTE — Telephone Encounter (Signed)
CALLED PATIENT TO ASK ABOUT HAVING LABS TODAY AFTER VISIT WITH DR.ZHAO, PT. AGREED TO HAVE LABS ON 06-04-18 @ 2:30 PM

## 2018-06-05 ENCOUNTER — Telehealth: Payer: Self-pay | Admitting: Hematology

## 2018-06-05 NOTE — Telephone Encounter (Signed)
Return to be determined  °

## 2018-06-11 ENCOUNTER — Telehealth: Payer: Self-pay | Admitting: *Deleted

## 2018-06-11 NOTE — Telephone Encounter (Signed)
Oncology Nurse Navigator Documentation  Returned pt's VMM.  He informed met with Dr. [name], [facility], yesterday, is moving forward with TORS scheduled 06/16/2018.  He acknowledged he will likely need adjuvant RT, I explained the process/timeframe.  Drs Isidore Moos and Maylon Peppers updated.

## 2018-06-12 NOTE — Telephone Encounter (Signed)
Thank you for letting me know.  Dr. Darien Kading 

## 2018-06-16 HISTORY — PX: OTHER SURGICAL HISTORY: SHX169

## 2018-06-20 ENCOUNTER — Telehealth: Payer: Self-pay | Admitting: *Deleted

## 2018-06-20 NOTE — Telephone Encounter (Signed)
A user error has taken place: encounter opened in error, closed for administrative reasons.

## 2018-06-20 NOTE — Telephone Encounter (Signed)
Oncology Nurse Navigator Documentation  Received call from Mr. Albert Flowers with post-TORS update.   He stated his 3/30 surgery went well, received call from Dr. [name] yesterday indicating he does not think adjuvant RT is needed based on surgical path report.   I thanked him for the update, indicated I would inform Drs. Tillie Fantasia.  Gayleen Orem, RN, BSN Head & Neck Oncology Nurse [place] at [place] [phone]

## 2018-10-27 ENCOUNTER — Encounter: Payer: Self-pay | Admitting: *Deleted

## 2018-10-27 NOTE — Progress Notes (Signed)
Oncology Nurse Navigator Documentation  Flowsheet/spreadsheet update.  Rick Diehl, RN, BSN Head & Neck Oncology Nurse Navigator [facility] at [place] [phone]   

## 2019-05-04 ENCOUNTER — Other Ambulatory Visit: Payer: Self-pay

## 2019-05-04 HISTORY — PX: OTHER SURGICAL HISTORY: SHX169

## 2019-05-05 MED ORDER — ENOXAPARIN SODIUM 40 MG/0.4ML ~~LOC~~ SOLN
40.00 | SUBCUTANEOUS | Status: DC
Start: 2019-05-06 — End: 2019-05-05

## 2019-05-05 MED ORDER — SODIUM CHLORIDE FLUSH 0.9 % IV SOLN
10.00 | INTRAVENOUS | Status: DC
Start: ? — End: 2019-05-05

## 2019-05-05 MED ORDER — OXYCODONE HCL 5 MG PO TABS
5.00 | ORAL_TABLET | ORAL | Status: DC
Start: ? — End: 2019-05-05

## 2019-05-05 MED ORDER — INSULIN LISPRO 100 UNIT/ML ~~LOC~~ SOLN
2.00 | SUBCUTANEOUS | Status: DC
Start: 2019-05-05 — End: 2019-05-05

## 2019-05-05 MED ORDER — ONDANSETRON HCL 4 MG/2ML IJ SOLN
4.00 | INTRAMUSCULAR | Status: DC
Start: ? — End: 2019-05-05

## 2019-05-05 MED ORDER — DSS 100 MG PO CAPS
100.00 | ORAL_CAPSULE | ORAL | Status: DC
Start: 2019-05-05 — End: 2019-05-05

## 2019-05-05 MED ORDER — LACTATED RINGERS IV SOLN
INTRAVENOUS | Status: DC
Start: ? — End: 2019-05-05

## 2019-05-05 MED ORDER — POLYETHYLENE GLYCOL 3350 17 GM/SCOOP PO POWD
17.00 | ORAL | Status: DC
Start: 2019-05-06 — End: 2019-05-05

## 2019-05-05 MED ORDER — BACITRACIN ZINC 500 UNIT/GM EX OINT
TOPICAL_OINTMENT | CUTANEOUS | Status: DC
Start: 2019-05-05 — End: 2019-05-05

## 2019-05-05 MED ORDER — ROSUVASTATIN CALCIUM 20 MG PO TABS
20.00 | ORAL_TABLET | ORAL | Status: DC
Start: 2019-05-06 — End: 2019-05-05

## 2019-05-05 MED ORDER — SODIUM CHLORIDE FLUSH 0.9 % IV SOLN
10.00 | INTRAVENOUS | Status: DC
Start: 2019-05-05 — End: 2019-05-05

## 2019-05-05 MED ORDER — CELECOXIB 200 MG PO CAPS
200.00 | ORAL_CAPSULE | ORAL | Status: DC
Start: 2019-05-05 — End: 2019-05-05

## 2019-05-05 MED ORDER — ACETAMINOPHEN 500 MG PO TABS
1000.00 | ORAL_TABLET | ORAL | Status: DC
Start: 2019-05-05 — End: 2019-05-05

## 2019-05-05 MED ORDER — DEXTROSE 10 % IV SOLN
125.00 | INTRAVENOUS | Status: DC
Start: ? — End: 2019-05-05

## 2019-05-05 MED ORDER — GLUCOSE 40 % PO GEL
15.00 | ORAL | Status: DC
Start: ? — End: 2019-05-05

## 2019-05-22 ENCOUNTER — Telehealth: Payer: Self-pay | Admitting: *Deleted

## 2019-05-22 NOTE — Telephone Encounter (Signed)
Oncology Nurse Navigator Documentation  Returned call to [facility] [place].    She provided verbal referral per Dr. [name] for patient to see Dr. Isidore Moos to discuss adjuvant RT s/p 2/15 L tonsillectomy/neck dissection.  MedOnc referral pending further pathology review.    She agreed to request push to [facility] recent imaging: 04/16/19 PET, 04/07/19 CT Neck, 09/05/18 CT Neck.  Gayleen Orem, RN, BSN Head & Neck Oncology Nurse [place] at [place] [phone]

## 2019-05-25 ENCOUNTER — Ambulatory Visit
Admission: RE | Admit: 2019-05-25 | Discharge: 2019-05-25 | Disposition: A | Discharge status: Home / Self Care | Payer: Self-pay | Source: Ambulatory Visit | Attending: Radiation Oncology | Admitting: Radiation Oncology

## 2019-05-25 ENCOUNTER — Other Ambulatory Visit: Payer: Self-pay | Admitting: *Deleted

## 2019-05-25 DIAGNOSIS — C01 Malignant neoplasm of base of tongue: Secondary | ICD-10-CM

## 2019-05-26 ENCOUNTER — Encounter: Payer: Self-pay | Admitting: *Deleted

## 2019-05-27 ENCOUNTER — Telehealth: Payer: Self-pay | Admitting: *Deleted

## 2019-05-27 ENCOUNTER — Encounter: Payer: Self-pay | Admitting: Radiation Oncology

## 2019-05-27 NOTE — Progress Notes (Signed)
Head and Neck Cancer Location of Tumor / Histology:  05/04/19 FINAL PATHOLOGIC DIAGNOSIS MICROSCOPIC EXAMINATION AND DIAGNOSIS A. LEFT TONSIL, TONSILLECTOMY: Benign tonsil with tonsillith. No malignancy identified. B. 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 Greatest dimension of positive lymph node 3.0 cm. See comment. C. LEFT NECK CONTENTS LEVELS 2B, DISSECTION: No malignancy identified in six lymph nodes (0/6). D. SUBMANDIBULAR NODE, EXCISION: No malignancy identified in one lymph node (0/1).  Patient presented with symptoms of: He presented to Dr. [name] on 04/07/19 for routine follow up. Dr. [name] observed a new left neck lymph node that was suspicious and sent him for a CT scan.   Biopsies of left neck revealed: metastatic squamous cell carcinoma in one of eighteen lymph nodes.   Nutrition Status Yes No Comments  Weight changes? []  [x]    Swallowing concerns? []  [x]    PEG? []  [x]     Referrals Yes No Comments  Social Work? []  [x]    Dentistry? []  [x]    Swallowing therapy? []  [x]    Nutrition? []  [x]    Med/Onc? []  [x]     Safety Issues Yes No Comments  Prior radiation? []  [x]    Pacemaker/ICD? []  [x]    Possible current pregnancy? []  [x]    Is the patient on methotrexate? []  [x]     Tobacco/Marijuana/Snuff/ETOH use: He has never smoked. He drinks alcohol occasionally.    Past/Anticipated interventions by otolaryngology, if any:  04/07/19 Dr. [name] office visit: Impression  Right tonsil squamous cell carcinoma (HPV+), s/p TORS and right neck dissection on 06/16/18. Pathology with 1.4 cm tumor, negative margins, no LVI, no PNI, 1/24 nodes (4.5 cm, no ENE).  There is no evidence of disease on exam today on the right, but a new lymph node in the left upper neck is suspicious. Will obtain a scan and call with results - [phone] c    05/04/19 Dr. [name]: Procedures/Surgeries performed during hospitalization:  TONSILLECTOMY (Left Throat) NECK DISSECTION MODIFIED RADICAL <6HRS (Left Neck)  He will see him again on 06/08/19.  Past/Anticipated interventions by medical oncology, if any:  He last saw Dr. Maylon Peppers on 06/04/2018- no appointment scheduled at this time.    Current Complaints / other details:

## 2019-05-27 NOTE — Telephone Encounter (Addendum)
Oncology Nurse Navigator Documentation  Placed introductory call to new referral patient Albert Flowers.    He indicated he remembered me from last year's consult with Dr. Isidore Moos.  He confirmed understanding of referral, Friday's virtual appt with Dr. Isidore Moos.  Reminded him of my role as his navigator, provided my contact information.   I explained he will be referred to Dental Medicine again for fabrication of SPD and fluoride trays as it is unlikely those fabricated last year are available.    I encouraged him to call with questions/concerns as he moves forward with appts and procedures.    Navigator Initial Assessment . Employment Status: security guard with [employer] . Currently on FMLA / STD:  Will bring forms for FMLA when he has his first [facility] appt . Living Situation/Support System: lives with wife . PCP: yes . PCD: yes but hasn't seen since last year's TORS at [facility] . Financial Concerns: no . Transportation Needs: no . Sensory Deficits: no . Language Barriers/Interpreter Needed:  no . Ambulation Needs: no . DME Used in Home: no . Psychosocial Needs:  no . Concerns/Needs Understanding Cancer:  addressed/answered by navigator to best of ability . Self-Expressed Needs: no  Gayleen Orem, RN, BSN Head & Neck Oncology Nurse [place] at [place] [phone]

## 2019-05-28 ENCOUNTER — Telehealth: Payer: Self-pay | Admitting: Radiation Oncology

## 2019-05-28 NOTE — Progress Notes (Signed)
Radiation Oncology         [phone] ________________________________  Outpatient Re-Consultation by telephone as patient was unable to access MyChart video during pandemic precautions   Name: Albert Flowers MRN: [id number]  Date: 05/29/2019  DOB: 02-Jun-1970  SJ:GGEZ, Edwyna Shell, MD  [name], MD   REFERRING PHYSICIAN: [name], MD  DIAGNOSIS: C01 - Base of tongue cancer (cancer included Right tonsil/base of tongue)   ICD-10-CM   1. Malignant neoplasm of tonsillar fossa ([place])  C09.0 Amb Referral to Nutrition and Diabetic E    Ambulatory referral to Physical Therapy    Referral to Neuro Rehab    Ambulatory referral to Dentistry  2. Malignant neoplasm of base of tongue ([place])  C01    Cancer Staging Malignant neoplasm of base of tongue ([place]) Staging form: Pharynx - HPV-Mediated Oropharynx, AJCC 8th Edition - Clinical: Stage I (cT2, cN1, cM0, p16+) - Signed by [name], MD on 06/02/2018 - Pathologic stage from 05/29/2019: Stage I (rpT0, pN1, cM0, p16+) - Signed by [name], MD on 05/29/2019  CHIEF COMPLAINT: Here to discuss management of throat cancer  HISTORY OF PRESENT ILLNESS::Albert Flowers is a 49 y.o. male who presented a year ago with right sided neck lump that he noted in early January 2020.  Subsequently, the patient saw Dr. Constance Holster who performed FNA and recommended the following: PET scan, radiation oncology consult, medical oncology consult, and dental medicine consult.   Fine needle aspiration on 05/07/2018 revealed: Right IJ chain lymph node, fine needle aspiration with squamous cell carcinoma, metastatic. Cytology or core biopsy with squamous cell carcinoma, metastatic. Immunochemical stains showed neoplastic cells positive for p63 and p16.   CT Neck with contrast performed on 04/24/2018 revealing: Right level 2 necrotic nodal mass and level 3 necrotic lymph node are highly concerning for nodal metastasis from head and neck primary neoplasm likely  oropharynx or the oral cavity. Mild asymmetry and fullness of right nasopharyngeal region, nonspecific. 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   Additionally, he had a PET scan on 05/28/2018 showed: Asymmetric hypermetabolic activity in the RIGHT lingular tonsil/base of tongue region favored primary carcinoma. Two hypermetabolic RIGHT level II metastatic lymph nodes. No LEFT cervical lymphadenopathy. No distant metastatic disease.  I met the patient a year ago. I referred him to Dr. [name], and they proceeded to TORS on 06/16/2018. Pathology from the procedure showed: invasive p16-positive (HPV-related) squamous cell carcinoma, 1.4 cm, negative margins. Out of 24 biopsied lymph nodes, only one was positive for metastatic carcinoma (1/24).  This node was 4.5cm but with no extracapsular extension.  No LVSI and no PNI.  He underwent right neck dissection but not left neck dissection.  At postop follow up, they agreed to proceed with observation and reserve radiation therapy for any possible future recurrence.  At follow up on 04/07/2019, Dr. [name] noted a new suspicious left upper neck lymph node. Neck CT performed that day revealed: new heterogeneous 2.2 cm contralateral left level 2a lymph node suspicious for contralateral nodal metastasis; indeterminate mild interval increase in size of several left level 2a and 2b lymph nodes.  PET scan performed on 04/16/2019 showed: single hypermetabolic left cervical lymph node; mild asymmetric FDG uptake in left glossotonsillar sulcus; focal FDG uptake in L1 vertebral body without CT correlate; otherwise, no malignant-range FDG uptake elsewhere.  He underwent left tonsillectomy and left neck dissection on 05/04/2019 with pathology revealing: benign left tonsil. Out of a total of 25 biopsied lymph nodes,  only one was positive for metastatic carcinoma but negative for extracapsular extension and positive for p16.  Swallowing issues, if any:  No Weight Changes: No  Tobacco history, if any: He has never smoked cigarettes  ETOH abuse, if any: He consumes alcohol occasionally.   Shoulder is sore, neck is sore on left postoperatively. Throat feels okay.   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 He plans to be out of town on vacation from March 18-22.  PREVIOUS RADIATION THERAPY: No  PAST MEDICAL HISTORY:  has a past medical history of Diabetes mellitus without complication ([place]).    PAST SURGICAL HISTORY: Past Surgical History:  Procedure Laterality Date  . left neck dissection Left 05/04/2019   Left Neck Dissection by Dr. [name] at [facility].   . neck sugery     as a child "had a knot removed from neck" not sure which side.   . RIght neck dissection Right 06/16/2018   TORS and right neck dissection. Dr. [name] at [place]:  No related family history reported   SOCIAL HISTORY:  reports that he has never smoked. He has never used smokeless tobacco. He reports current alcohol use. He reports that he does not use drugs.  ALLERGIES: Penicillins  MEDICATIONS:  Current Outpatient Medications  Medication Sig Dispense Refill  . acetaminophen (TYLENOL) 500 MG tablet Take 500 mg by mouth every 6 (six) hours as needed.    Marland Kitchen aspirin EC 81 MG tablet Take 81 mg by mouth daily.    . celecoxib (CELEBREX) 100 MG capsule Take 100 mg by mouth 2 (two) times daily.    . metFORMIN (GLUCOPHAGE) 1000 MG tablet Take by mouth.    . oxyCODONE (OXY IR/ROXICODONE) 5 MG immediate release tablet Take 5 mg by mouth every 4 (four) hours as needed.    . rosuvastatin (CRESTOR) 20 MG tablet     . sodium fluoride (PREVIDENT 5000 PLUS) 1.1 % CREA dental cream Apply to tooth brush. Brush teeth for 2 minutes. Spit out excess. DO NOT rinse afterwards. Repeat nightly. (Patient not taking: Reported on 05/30/2018) 1 Tube prn   No current facility-administered medications for this encounter.    REVIEW OF SYSTEMS:  As above   PHYSICAL EXAM:   vitals were not taken for this visit.   General: Alert and oriented, in no acute distress     LABORATORY DATA:  Lab Results  Component Value Date   WBC 5.7 06/04/2018   HGB 14.6 06/04/2018   HCT 44.3 06/04/2018   MCV 93.1 06/04/2018   PLT 276 06/04/2018   CMP     Component Value Date/Time   NA 138 06/04/2018 1446   K 4.4 06/04/2018 1446   CL 103 06/04/2018 1446   CO2 25 06/04/2018 1446   GLUCOSE 136 (H) 06/04/2018 1446   BUN 14 06/04/2018 1446   CREATININE 0.97 06/04/2018 1446   CALCIUM 9.6 06/04/2018 1446   PROT 7.9 06/04/2018 1446   ALBUMIN 4.5 06/04/2018 1446   AST 22 06/04/2018 1446   ALT 33 06/04/2018 1446   ALKPHOS 89 06/04/2018 1446   BILITOT 0.5 06/04/2018 1446   GFRNONAA >60 06/04/2018 1446   GFRAA >60 06/04/2018 1446      Lab Results  Component Value Date   TSH 1.621 06/04/2018     RADIOGRAPHY: As above, personally reviewed by me  IMPRESSION/PLAN:  Unfortunately the patient has developed recurrent disease in the contralateral neck.  I think it is very appropriate for him to receive  postoperative radiation at this juncture.  I recommend delivering 6 weeks of radiotherapy to the bilateral neck and the right tonsillar surgical bed in case he continues to have microscopic disease where his tonsillar surgery was performed a year ago.  We discussed the risks benefits and side effects of adjuvant salvage radiotherapy in detail.  He understands the side effects may include but not necessarily be limited to skin irritation, fatigue, dysgeusia, mucositis, dysphagia, hair loss, weight loss, malnutrition, necessity for feeding tube.  We discussed late effects of radiation that may include but not necessarily be limited to xerostomia, dental issues, soft tissue and bone injury, hypothyroidism, lymphedema, fibrosis, injury to vessels in the neck that could later cause deadly episodes such as a stroke.  We discussed the importance of having a heart healthy lifestyle in the  long-term.  We discussed the importance of eating a high-protein high-calorie diet in the short-term.  He is enthusiastic about proceeding with treatment.  We will schedule him for CT simulation once he is released by dentistry.  We will make referrals to dentistry, nutrition, speech-language pathology, physical therapy, and social work for multidisciplinary care.  I look forward to participating in his care.  All questions on the part of the patient and his wife were answered to their satisfaction and to the best of my ability.  On date of service, in total, I spent 50 minutes on this encounter This encounter was provided by telemedicine platform by telephone as patient was unable to access MyChart video during pandemic precautions The patient has given verbal consent for this type of encounter and has been advised to only accept a meeting of this type in a secure network environment. The attendants for this meeting include [name]  and Sherolyn Buba.  During the encounter, [name] was located at [facility] Radiation Oncology Department.  Duante Arocho was located at home.   __________________________________________   [name], MD   This document serves as a record of services personally performed by [name], MD. It was created on her behalf by Wilburn Mylar, a trained medical scribe. The creation of this record is based on the scribe's personal observations and the provider's statements to them. This document has been checked and approved by the attending provider.

## 2019-05-29 ENCOUNTER — Ambulatory Visit
Admission: RE | Admit: 2019-05-29 | Discharge: 2019-05-29 | Disposition: A | Discharge status: Home / Self Care | Payer: Managed Care, Other (non HMO) | Source: Ambulatory Visit | Attending: Radiation Oncology | Admitting: Radiation Oncology

## 2019-05-29 ENCOUNTER — Telehealth: Payer: Self-pay | Admitting: *Deleted

## 2019-05-29 ENCOUNTER — Encounter: Payer: Self-pay | Admitting: Radiation Oncology

## 2019-05-29 ENCOUNTER — Other Ambulatory Visit: Payer: Self-pay

## 2019-05-29 DIAGNOSIS — C090 Malignant neoplasm of tonsillar fossa: Secondary | ICD-10-CM

## 2019-05-29 DIAGNOSIS — C760 Malignant neoplasm of head, face and neck: Secondary | ICD-10-CM

## 2019-05-29 DIAGNOSIS — C01 Malignant neoplasm of base of tongue: Secondary | ICD-10-CM

## 2019-05-29 NOTE — Telephone Encounter (Signed)
Oncology Nurse Navigator Documentation  Joined Albert Flowers during Telemedicine consult with Dr. Isidore Moos to discuss adjuvant RT s/p 2/15 L tonsilectomy/neck dissection.  He was accompanied by his wife.  He had consulted last year prior to TORS for R tonsil carcinoma. They voiced understanding of:  Plan for 6 weeks M-F RT, CT SIM pending appt with Dr. Enrique Sack.  Referrals to SLP, PT, Nutrition and SW. He noted they will be out of town 3/18-21, requested upcoming appts be scheduled accordingly. I provided him my phone #, encouraged him to call me with questions/concerns moving forward.  He agreed to do so.  Gayleen Orem, RN, BSN Head & Neck Oncology Nurse [place] at [place] [phone]

## 2019-05-29 NOTE — Progress Notes (Signed)
Dental Form with Estimates of Radiation Dose      Diagnosis:    ICD-10-CM   1. Malignant neoplasm of tonsillar fossa ([facility])  C09.0 Amb Referral to Nutrition and Diabetic E    Ambulatory referral to Physical Therapy    Referral to Neuro Rehab    Ambulatory referral to Dentistry  2. Malignant neoplasm of base of tongue ([facility])  C01     Prognosis: curative  Anticipated # of fractions: 30    Daily?: yes  # of weeks of radiotherapy: 6  Chemotherapy?: no  Anticipated xerostomia:  Mild permanent   Pre-simulation needs:  / Scatter protection /   Simulation: ASAP   Other Notes:  Please contact [name], MD, with patient's disposition after evaluation and/or dental treatment.

## 2019-06-01 ENCOUNTER — Encounter (HOSPITAL_COMMUNITY): Payer: Self-pay | Admitting: Dentistry

## 2019-06-01 ENCOUNTER — Other Ambulatory Visit: Payer: Self-pay

## 2019-06-01 ENCOUNTER — Telehealth: Payer: Self-pay | Admitting: *Deleted

## 2019-06-01 ENCOUNTER — Telehealth: Payer: Self-pay | Admitting: Radiation Oncology

## 2019-06-01 ENCOUNTER — Ambulatory Visit (HOSPITAL_COMMUNITY): Payer: Self-pay | Admitting: Dentistry

## 2019-06-01 VITALS — BP 123/82 | HR 68 | Temp 98.2°F

## 2019-06-01 DIAGNOSIS — K0530 Chronic periodontitis, unspecified: Secondary | ICD-10-CM

## 2019-06-01 DIAGNOSIS — Z01818 Encounter for other preprocedural examination: Secondary | ICD-10-CM

## 2019-06-01 DIAGNOSIS — C090 Malignant neoplasm of tonsillar fossa: Secondary | ICD-10-CM

## 2019-06-01 DIAGNOSIS — M2624 Reverse articulation: Secondary | ICD-10-CM

## 2019-06-01 DIAGNOSIS — K036 Deposits [accretions] on teeth: Secondary | ICD-10-CM

## 2019-06-01 DIAGNOSIS — M264 Malocclusion, unspecified: Secondary | ICD-10-CM

## 2019-06-01 DIAGNOSIS — K031 Abrasion of teeth: Secondary | ICD-10-CM

## 2019-06-01 DIAGNOSIS — K08409 Partial loss of teeth, unspecified cause, unspecified class: Secondary | ICD-10-CM

## 2019-06-01 DIAGNOSIS — K06010 Localized gingival recession, unspecified: Secondary | ICD-10-CM

## 2019-06-01 MED ORDER — SODIUM FLUORIDE 1.1 % DT CREA: TOPICAL_CREAM | DENTAL | 99 refills | Status: DC

## 2019-06-01 NOTE — Patient Instructions (Signed)
COVID-19 Education: The signs and symptoms of COVID-19 were discussed with the patient and how to seek care for testing (follow up with PCP or arrange E-visit).   The importance of social distancing was discussed today.  TRISMUS  Trismus is a condition where the jaw does not allow the mouth to open as wide as it usually does.  This can happen almost suddenly, or in other cases the process is so slow, it is hard to notice it-until it is too far along.  When the jaw joints and/or muscles have been exposed to radiation treatments, the onset of Trismus is very slow.  This is because the muscles are losing their stretching ability over a long period of time, as long as 2 YEARS after the end of radiation.  It is therefore important to exercise these muscles and joints.  TRISMUS EXERCISES   Stack of tongue depressors measuring the same or a little less than the last documented MIO (Maximum Interincisal Opening).  Secure them with a rubber band on both ends.  Place the stack in the patient's mouth, supporting the other end.  Allow 30 seconds for muscle stretching.  Rest for a few seconds.  Repeat 3-5 times  For all radiation patients, this exercise is recommended in the mornings and evenings unless otherwise instructed.  The exercise should be done for a period of 2 YEARS after the end of radiation.  MIO should be checked routinely on recall dental visits by the general dentist or the hospital dentist.  The patient is advised to report any changes, soreness, or difficulties encountered when doing the exercises.  FLUORIDE TRAYS PATIENT INSTRUCTIONS    Obtain Prevident 5000 prescription from the pharmacy.  Don't be surprised if it needs to be ordered.  Be sure to let the pharmacy know when you are close to needing a new refill for them to have it ready for you without interruption of Fluoride use.  The best time to use your Fluoride is before bedtime.  You must brush your teeth very  well and floss before using the Fluoride in order to get the best use out of the Fluoride treatments.  Place Fluoride gel in the tray and spread gel around in the tray with your finger or cotton tip applicator.  Place the tray on your lower teeth and your upper teeth.  Make sure the trays are seated all the way.  Remember, they only fit one way on your teeth.  Insert for 5 full minutes.  At the end of the 5 minutes, take the trays out.  SPIT OUT excess.   Do NOT rinse your mouth!  Do NOT eat or drink after treatments for at least 30 minutes.  This is why the best time for your treatments is before bedtime.  Clean the inside of your Fluoride trays using COLD WATER and a toothbrush.  In order to keep your Trays from discoloring and free from odors, soak them overnight in denture cleaners such as Efferdent.  Do not use bleach or non denture products.  Store the trays in a safe dry place AWAY from any heat until your next treatment.  If anything happens to your Fluoride trays, or they don't fit as well after any dental work, please let us know as soon as possible.

## 2019-06-01 NOTE — Progress Notes (Signed)
06/01/2019  Patient Name:   Sterlyn Romo Date of Birth:   1970/12/20 Medical Record Number: [id number]  BP 123/82 (BP Location: Right Arm)   Pulse 68   Temp 98.2 F (36.8 C)   Junus Sawdy now presents for insertion of upper and lower fluoride trays and scatter protection devices.  PROCEDURE: Appliances were tried in and adjusted as needed. [place] Trismus device was fabricated 45 mm using 25 sticks. Postop instructions were provided and a written and verbal format concerning the use and care of appliances. All questions were answered. Patient to call for appointment for periodic oral examination in approximately 2-3 weeks during radiation therapy if needed. Otherwise, patient will be scheduled for oral examination after radiation therapy approximately 1 month after treatment is completed. Patient to call if questions or problems arise before then.  Lenn Cal, DDS

## 2019-06-01 NOTE — Telephone Encounter (Signed)
Oncology Nurse Navigator Documentation  Called Mr. Albert Flowers, informed him of CT SIM appt this Wed 2:30, encouraged him to arrive no later than 2:15 for registration, explained procedure, arrival to Radiation Waiting.  He voiced understanding.  Gayleen Orem, RN, BSN Head & Neck Oncology Nurse [place] at [place] [phone]

## 2019-06-01 NOTE — Progress Notes (Signed)
DENTAL EXAMINATION  Date of Consultation:  06/01/2019 Patient Name:   Albert Flowers Date of Birth:   Aug 11, 1970 Medical Record Number: [id number]  COVID 19 SCREENING: The patient does not symptoms concerning for COVID-19 infection (Including fever, chills, cough, or new SHORTNESS OF BREATH).   VITALS: BP 123/82 (BP Location: Right Arm)   Pulse 68   Temp 98.2 F (36.8 C)    CHIEF COMPLAINT: Patient referred by Dr. Isidore Moos for a dental consultation.  HPI: Albert Flowers is a 49 year old male recently previously diagnosed with head neck cancer after biopsy of the right neck mass on 05/14/2018.  Patient had a PET scan that revealed possible right lingual tonsil/base of tongue cancer on 05/28/2018.  Patient then proceeded with TORS procedure.  Patient declined radiation therapy postoperatively.  Patient subsequently developed recurrence in the contralateral neck.  Patient then underwent tonsillectomy and neck dissection procedure with Dr. [name].  Patient with anticipated postoperative radiation therapy with Dr. Isidore Moos.  Patient is again seen for medically necessary  preradiation therapy dental evaluation, radiograph, and insertion of upper and lower fluoride trays and scatter protection devices.  The patient currently denies acute toothaches, swellings, or abscesses.  Patient was last seen by his primary dentist, Dr. Johney Maine, for a dental cleaning and exam and radiographs on 06/10/2018.  Patient denies having had a restoration placed on tooth #7.  Patient denies having had root canal therapy on tooth #25.    Patient denies having dental phobia.   PROBLEM LIST: Patient Active Problem List   Diagnosis Date Noted  . Malignant neoplasm of tonsillar fossa ([place]) 06/01/2019  . Malignant neoplasm of base of tongue ([place]) 06/02/2018  . 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 Diabetes mellitus without complication (HCC)     PSH: Past Surgical  History:  Procedure Laterality Date  . left neck dissection Left 05/04/2019   Left Neck Dissection by Dr. [name] at [facility].   . neck sugery     as a child "had a knot removed from neck" not sure which side.   . RIght neck dissection Right 06/16/2018   TORS and right neck dissection. Dr. [name] at [facility]     ALLERGIES: Allergies  Allergen Reactions  . Penicillins Other (See Comments)    Unknown reaction    MEDICATIONS: Current Outpatient Medications  Medication Sig Dispense Refill  . acetaminophen (TYLENOL) 500 MG tablet Take 500 mg by mouth every 6 (six) hours as needed.    Marland Kitchen aspirin EC 81 MG tablet Take 81 mg by mouth daily.    . celecoxib (CELEBREX) 100 MG capsule Take 100 mg by mouth 2 (two) times daily.    . metFORMIN (GLUCOPHAGE) 1000 MG tablet Take by mouth.    . oxyCODONE (OXY IR/ROXICODONE) 5 MG immediate release tablet Take 5 mg by mouth every 4 (four) hours as needed.    . rosuvastatin (CRESTOR) 20 MG tablet     . sodium fluoride (PREVIDENT 5000 PLUS) 1.1 % CREA dental cream Apply to tooth brush. Brush teeth for 2 minutes. Spit out excess. DO NOT rinse afterwards. Repeat nightly. (Patient not taking: Reported on 05/30/2018) 1 Tube prn   No current facility-administered medications for this visit.    LABS: CBC    Component Value Date/Time   WBC 5.7 06/04/2018 1446   RBC 4.76 06/04/2018 1446   HGB 14.6 06/04/2018 1446   HCT 44.3 06/04/2018 1446   PLT 276 06/04/2018 1446  MCV 93.1 06/04/2018 1446   MCH 30.7 06/04/2018 1446   MCHC 33.0 06/04/2018 1446   RDW 12.6 06/04/2018 1446   LYMPHSABS 1.9 06/04/2018 1446   MONOABS 0.5 06/04/2018 1446   EOSABS 0.1 06/04/2018 1446   BASOSABS 0.1 06/04/2018 1446      Component Value Date/Time   NA 138 06/04/2018 1446   K 4.4 06/04/2018 1446   CL 103 06/04/2018 1446   CO2 25 06/04/2018 1446   GLUCOSE 136 (H) 06/04/2018 1446   BUN 14 06/04/2018 1446   CREATININE 0.97 06/04/2018 1446   CALCIUM 9.6 06/04/2018 1446    GFRNONAA >60 06/04/2018 1446   GFRAA >60 06/04/2018 1446   No results found for: INR, PROTIME No results found for: PTT  SOCIAL HISTORY: Social History   Socioeconomic History  . Marital status: Married    Spouse name: Not on file  . Number of children: 4  . Years of education: Not on file  . Highest education level: Not on file  Occupational History  . Not on file  Tobacco Use  . Smoking status: Never Smoker  . Smokeless tobacco: Never Used  Substance and Sexual Activity  . Alcohol use: Yes    Comment: occasional  . Drug use: Never  . Sexual activity: Not on file  Other Topics Concern  . Not on file  Social History Narrative  . Not on file   Social Determinants of Health   Financial Resource Strain:   . Difficulty of Paying Living Expenses:   Food Insecurity:   . Worried About Charity fundraiser in the Last Year:   . Arboriculturist in the Last Year:   Transportation Needs: No Transportation Needs  . Lack of Transportation (Medical): No  . Lack of Transportation (Non-Medical): No  Physical Activity:   . Days of Exercise per Week:   . Minutes of Exercise per Session:   Stress:   . Feeling of Stress :   Social Connections:   . Frequency of Communication with Friends and Family:   . Frequency of Social Gatherings with Friends and Family:   . Attends Religious Services:   . Active Member of Clubs or Organizations:   . Attends Archivist Meetings:   Marland Kitchen Marital Status:   Intimate Partner Violence: Not At Risk  . Fear of Current or Ex-Partner: No  . Emotionally Abused: No  . Physically Abused: No  . Sexually Abused: No    FAMILY HISTORY: History reviewed. No pertinent family history.  REVIEW OF SYSTEMS: Reviewed with the patient as per History of present illness. Psych: Patient denies having dental phobia.  DENTAL HISTORY: CHIEF COMPLAINT: Patient referred by Dr. Isidore Moos for a dental consultation.  HPI: Albert Flowers is a 49 year old male  recently previously diagnosed with head neck cancer after biopsy of the right neck mass on 05/14/2018.  Patient had a PET scan that revealed possible right lingual tonsil/base of tongue cancer on 05/28/2018.  Patient then proceeded with TORS procedure.  Patient declined radiation therapy postoperatively.  Patient subsequently developed recurrence in the contralateral neck.  Patient then underwent tonsillectomy and neck dissection procedure with Dr. [name].  Patient with anticipated postoperative radiation therapy with Dr. Isidore Moos.  Patient is again seen for medically necessary  preradiation therapy dental evaluation, radiograph, and insertion of upper and lower fluoride trays and scatter protection devices.  The patient currently denies acute toothaches, swellings, or abscesses.  Patient was last seen by his primary dentist, Dr. Johney Maine,  for a dental cleaning and exam and radiographs on 06/10/2018.  Patient denies having had a restoration placed on tooth #7.  Patient denies having had root canal therapy on tooth #25.    Patient denies having dental phobia.   DENTAL EXAMINATION: GENERAL: The patient is a well-developed, well-nourished male in no acute distress.    HEAD AND NECK:  The patient has had previous right and left neck dissections.  The patient has right TMJ click/pop at maximum opening.  However, the patient denies acute TMJ symptoms.  Patient has a maximum interincisal opening of 45 mm.   INTRAORAL EXAM: The patient has normal saliva.  There is no evidence of oral abscess formation.  Multiple flexure lesions are noted. DENTITION: The patient is missing tooth numbers 1, 4, 13, 16, 17, 20, 29, and 32.  The spaces of the premolars have been closed after orthodontic therapy.  Patient has a left posterior crossbite. PERIODONTAL: The patient has chronic periodontitis with plaque and calculus accumulations, gingival recession, and incipient to moderate bone loss. DENTAL CARIES/SUBOPTIMAL RESTORATIONS:  Patient may have incipient dental caries on the mesial of #7.  Crown margin on the distal of #21 is less than ideal.  Patient to continue follow-up with his primary dentist, Dr. Johney Maine, for evaluation for restorations as indicated. ENDODONTIC: The patient currently denies acute pulpitis symptoms.  The patient may have incipient periapical radiolucency starting at the apices of tooth #25.  This may also represent occlusal trauma.  Patient has had a previous root canal therapy associated with tooth #26.  Patient to continue follow-up with his primary dentist for referral for root canal therapy on tooth #25 as indicated. CROWN AND BRIDGE: Patient has a bridge from tooth numbers 21-25-26-27.  The crown margin on the distal of #21 is less than ideal. PROSTHODONTIC: No partial dentures. OCCLUSION: Patient has a poor occlusal scheme and malocclusion.  Patient has left posterior crossbite.  RADIOGRAPHIC INTERPRETATION: An orthopantogram was taken today.   There are multiple missing teeth.  The spaces associated with the missing multiple premolars have been close secondary to orthodontic therapy.  There is incipient to moderate bone loss noted.  Multiple amalgam and resin restorations are noted.  There is a bridge from tooth numbers 21 - 25-26-27 noted.   ASSESSMENTS: 1.  Malignant neoplasm of tonsillar fossa  2.  Preradiation therapy dental protocol examination 3.  Chronic periodontitis with bone loss 4.  Gingival recession 5.  Accretions 6.  Multiple flexure lesions 7.  Multiple missing teeth 8.  History of orthodontic therapy closing the spaces of missing premolars 9.  Left posterior crossbite 10.  Poor occlusal scheme and malocclusion 11.  Suboptimal crown margin on the distal of #21. 12.  Incipient dental caries on the mesial of #7-followed by his primary dentist Dr. Johney Maine for restoration as indicated.   PLAN/RECOMMENDATIONS: 1. I discussed the risks, benefits, and complications of various  treatment options with the patient in relationship to his medical and dental conditions, previous TORS procedure and bilateral neck dissections, anticipated radiation therapy.  We also discussed the side effects of radiation therapy to include xerostomia, radiation caries, trismus, mucositis, taste changes, gum and jawbone changes, and risk for infection and osteoradionecrosis.  We discussed various treatment options to include no treatment, extraction of teeth in the primary field radiation therapy, alveoloplasty, pre-prosthetic surgery as indicated, periodontal therapy, dental restorations, root canal therapy, crown and bridge therapy, implant therapy, and replacement of missing teeth as indicated.  We also discussed insertion  of fluoride trays and scatter protection devices that were fabricated after the previous dental consultation in 2020.  After review of the anticipated ports and doses from Dr. Isidore Moos, it was determined that no teeth are in the field of primary field radiation therapy and patient  will not need extractions.  Patient did agree to proceed with insertion of the fluoride trays and scatter protection devices-today.  A prescription for Prevident 5000 fluoride toothpaste was provided to the patient today with refills for 1 year to use either on toothbrush or in the fluoride trays provided today.  This was sent to [facility] pharmacy.  The patient is to contact Dr. Tamala Bari to schedule dental cleaning prior to start of anticipated radiation therapy as time and space permits prior to start of radiation therapy.   2. Discussion of findings with medical team and coordination of future medical and dental care as needed.  I spent in excess of  90 minutes during the conduct of this consultation and >50% of this time involved direct face-to-face encounter for counseling and/or coordination of the patient's care.    Lenn Cal, DDS

## 2019-06-02 LAB — SURGICAL PATHOLOGY

## 2019-06-03 ENCOUNTER — Other Ambulatory Visit: Payer: Self-pay

## 2019-06-03 ENCOUNTER — Telehealth: Payer: Self-pay | Admitting: Nutrition

## 2019-06-03 ENCOUNTER — Ambulatory Visit
Admission: RE | Admit: 2019-06-03 | Discharge: 2019-06-03 | Disposition: A | Discharge status: Home / Self Care | Payer: Managed Care, Other (non HMO) | Source: Ambulatory Visit | Attending: Radiation Oncology | Admitting: Radiation Oncology

## 2019-06-03 ENCOUNTER — Encounter: Payer: Self-pay | Admitting: *Deleted

## 2019-06-03 DIAGNOSIS — Z510 Encounter for antineoplastic radiation therapy: Secondary | ICD-10-CM | POA: Diagnosis not present

## 2019-06-03 DIAGNOSIS — C090 Malignant neoplasm of tonsillar fossa: Secondary | ICD-10-CM | POA: Diagnosis not present

## 2019-06-03 NOTE — Progress Notes (Signed)
Oncology Nurse Navigator Documentation  To provide support, encouragement and care continuity, met with Albert Flowers during his CT SIM. He was accompanied by his wife.   He tolerated procedure without difficulty, denied questions/concerns.   I toured him to [facility] 4 treatment area, explained procedures for lobby registration, arrival to Radiation Waiting, arrival to tmt area and preparation for tmt.  He voiced understanding.   I encouraged him to call me prior to 3/29 [place] with questions/concerns.  Gayleen Orem, RN, BSN Head & Neck Oncology Nurse [place] at [place] [phone]

## 2019-06-03 NOTE — Telephone Encounter (Signed)
Scheduled appt per 3/15 sch message - pt aware of appt date and time with nutritionist

## 2019-06-04 ENCOUNTER — Ambulatory Visit: Payer: Managed Care, Other (non HMO)

## 2019-06-08 ENCOUNTER — Inpatient Hospital Stay: Payer: Managed Care, Other (non HMO) | Attending: Radiation Oncology | Admitting: Nutrition

## 2019-06-08 ENCOUNTER — Other Ambulatory Visit: Payer: Self-pay

## 2019-06-08 NOTE — Progress Notes (Signed)
49 year old male diagnosed with base of tongue/tonsil cancer.  He is a patient of Dr. Isidore Moos to receive radiation therapy.  Past medical history includes diabetes.  Medications include Celebrex, Glucophage, and Crestor.  Labs include glucose 136.  Height: 5 feet 10 inches. Weight: 193 pounds. Usual body weight: 200 pounds. BMI: 27.69.  Estimated nutrition needs: 2100-2300 cal, 105-120 g protein, 2.3 L fluid.  Patient presents to consult with his wife. He is currently eating without difficulty and generally consumes 3 meals daily.  He will snack occasionally. He is checking his blood sugars but not daily. He has no nutrition impact symptoms other than 7 pounds unintentional weight loss.  Nutrition diagnosis: Food and nutrition related knowledge deficit related to new cancer diagnosis as evidenced by no prior need for nutrition related information.  Intervention: Patient was educated to consume smaller more frequent meals and snacks with high-calorie, high-protein foods. Encouraged weight maintenance. Reviewed strategies for improving dry mouth. Encouraged adequate fluids. Encourage patient explore oral nutrition supplements and provided samples and coupons. Provided fact sheets.  Questions were answered.  Teach back method used.  Contact information provided.  Monitoring, evaluation, goals: Patient will tolerate adequate calories and protein to minimize weight loss.  Next visit: Monday, April 12 before radiation therapy.  **Disclaimer: This note was dictated with voice recognition software. Similar sounding words can inadvertently be transcribed and this note may contain transcription errors which may not have been corrected upon publication of note.**

## 2019-06-09 ENCOUNTER — Ambulatory Visit: Payer: Managed Care, Other (non HMO) | Attending: Radiation Oncology | Admitting: Physical Therapy

## 2019-06-09 DIAGNOSIS — R293 Abnormal posture: Secondary | ICD-10-CM | POA: Diagnosis not present

## 2019-06-09 DIAGNOSIS — C090 Malignant neoplasm of tonsillar fossa: Secondary | ICD-10-CM | POA: Diagnosis not present

## 2019-06-09 DIAGNOSIS — M6281 Muscle weakness (generalized): Secondary | ICD-10-CM | POA: Insufficient documentation

## 2019-06-09 DIAGNOSIS — M25512 Pain in left shoulder: Secondary | ICD-10-CM | POA: Diagnosis present

## 2019-06-09 NOTE — Patient Instructions (Signed)

## 2019-06-09 NOTE — Therapy (Signed)
[place], [place], [address] Phone: [phone]   Fax:  [phone]  Physical Therapy Evaluation  Patient Details  Name: Albert Flowers MRN: [id number] Date of Birth: [birth year] Referring Provider (PT): Reita May Date: 06/09/2019  PT End of Session - 06/09/19 1742    Visit Number  1    Number of Visits  1    PT Start Time  1500    PT Stop Time  1545    PT Time Calculation (min)  45 min       Past Medical History:  Diagnosis Date  . Diabetes mellitus without complication [facility])     Past Surgical History:  Procedure Laterality Date  . left neck dissection Left 05/04/2019   Left Neck Dissection by Dr. [name] at [facility].   . neck sugery     as a child "had a knot removed from neck" not sure which side.   . RIght neck dissection Right 06/16/2018   TORS and right neck dissection. Dr. [name] at [facility]    There were no vitals filed for this visit.   Subjective Assessment - 06/09/19 1524    Subjective  Pt starts radiation next Monday    Pertinent History  L tonsillectomy and lymph nodes on Feb 15 , had diffiuculty raising left shoulder afterward . 06/06/2018 neck  dissection on right side for tonsil and base of tongue cancer, Past hx of DM    Currently in Pain?  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   No    Is the patient reluctant to leave their home because of a fear of falling?   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  More recent surgery site of left site feels and little firmer with less skin excursion of scar     Skin Integrity  no open area       Sensation   Light Touch  Not tested      Coordination   Gross Motor Movements are Fluid and Coordinated  No   pain with elevation of left shoulder      ROM / Strength   AROM /  PROM / Strength  AROM;Strength      AROM   Right Shoulder Flexion  155 Degrees    Right Shoulder ABduction  175 Degrees    Left Shoulder Flexion  140 Degrees   painful   Left Shoulder ABduction  130 Degrees   painful    Cervical Flexion  55    Cervical Extension  50    Cervical - Right Side Bend  30    Cervical - Left Side Bend  45      Strength   Overall Strength  Deficits    Overall Strength Comments  pt with decreased scapular stablaization on the left     Strength Assessment Site  Shoulder    Right/Left Shoulder  Right;Left    Left Shoulder Flexion  2+/5    Left Shoulder ABduction  2/5        LYMPHEDEMA/ONCOLOGY QUESTIONNAIRE - 06/09/19 1734      Type   Cancer Type  bilateral tonsil cancer       Head and Neck   4 cm superior to sternal notch around neck  41.5 cm    6 cm superior to sternal notch around neck  41 cm    8 cm superior to sternal notch around neck  41.1 cm             Objective measurements completed on examination: See above findings.      [place] Adult PT Treatment/Exercise - 06/09/19 0001       Posture/Postural Control   Posture/Postural Control  Postural limitations    Postural Limitations  Rounded Shoulders;Forward head      Exercises   Exercises  Shoulder      Shoulder Exercises: Supine   Horizontal ABduction  Strengthening;Right;Left;5 reps;Theraband    Theraband Level (Shoulder Horizontal ABduction)  Level 2 (Red)    External Rotation  Strengthening;Right;Left;5 reps;Theraband    Theraband Level (Shoulder External Rotation)  Level 2 (Red)    Flexion  Strengthening;Right;Left;5 reps;Theraband   wide and narrow grip    Theraband Level (Shoulder Flexion)  Level 2 (Red)    Diagonals  Strengthening;Right;Left;5 reps;Theraband    Theraband Level (Shoulder Diagonals)  Level 2 (Red)      Shoulder Exercises: Standing   Row  Right;Left;5 reps;Theraband    Theraband Level (Shoulder Row)  Level 2 (Red)                      Head and Neck Clinic Goals - 06/09/19 1750      Patient will be able to verbalize understanding of proper sitting and standing posture.    Time  1    Period  Days    Status  New      Patient will be able to verbalize understanding of lymphedema risk and availability of treatment for this condition.    Time  1    Period  Days    Status  Achieved         Plan - 06/09/19 1743    Clinical Impression Statement  Pt comes to PT for baseline assessments prior to starting bilateral neck radiation next week.  He recently had neck dissection of left neck and is having some shoulder pain with decreased scapular stabalization and decreased neck lateral flexion to the left.  He was given exericises for that and also instructions in walking to continue throughout radiation. Pt will consider returning to PT for more treatment to his shoulder if  pain and weakness  perisist. Symptoms of lymphedema were introduced.    Personal Factors and Comorbidities  Comorbidity 3+    Comorbidities  bilateral neck sugery for cancer, DM     Stability/Clinical Decision Making  Stable/Uncomplicated    Clinical Decision Making  Low    PT Frequency  One time visit    PT Treatment/Interventions  ADLs/Self Care Home Management;Therapeutic exercise;Patient/family education    PT Next Visit Plan  One time visit.    PT Home Exercise Plan  supine scapular series    Consulted and Agree with Plan of Care  Patient       Patient will benefit from skilled therapeutic intervention in order to improve the following deficits and impairments:  Postural dysfunction, Pain, Impaired UE functional use, Decreased strength, Decreased range of motion  Visit Diagnosis: Abnormal posture - Plan: PT plan of care cert/re-cert  Acute pain of left shoulder - Plan: PT plan of care cert/re-cert  Muscle weakness (generalized) - Plan: PT plan of care cert/re-cert     Problem List Patient Active Problem List   Diagnosis Date Noted  . Malignant neoplasm of tonsillar fossa ([place]) 06/01/2019  . Malignant neoplasm of base of tongue ([place]) 06/02/2018  . Head and neck cancer ([place]) 05/14/2018   Donato Heinz. Owens Shark PT  Norwood Levo 06/09/2019, 5:52 PM  [place], [place], [address] Phone: [phone]   Fax:  [phone]  Name: Berl Bauman MRN: [id number] Date of Birth: 1970/04/17

## 2019-06-15 ENCOUNTER — Other Ambulatory Visit: Payer: Self-pay

## 2019-06-15 ENCOUNTER — Other Ambulatory Visit: Payer: Self-pay | Admitting: Radiation Oncology

## 2019-06-15 ENCOUNTER — Encounter: Payer: Self-pay | Admitting: *Deleted

## 2019-06-15 ENCOUNTER — Ambulatory Visit
Admission: RE | Admit: 2019-06-15 | Discharge: 2019-06-15 | Disposition: A | Discharge status: Home / Self Care | Payer: Managed Care, Other (non HMO) | Source: Ambulatory Visit | Attending: Radiation Oncology | Admitting: Radiation Oncology

## 2019-06-15 DIAGNOSIS — C090 Malignant neoplasm of tonsillar fossa: Secondary | ICD-10-CM

## 2019-06-15 MED ORDER — SONAFINE EX EMUL
1.0000 "application " | Freq: Two times a day (BID) | CUTANEOUS | Status: DC
  Administered 2019-06-15: 1 via TOPICAL

## 2019-06-15 MED ORDER — LIDOCAINE VISCOUS HCL 2 % MT SOLN: OROMUCOSAL | 4 refills | Status: DC

## 2019-06-15 NOTE — Progress Notes (Addendum)

## 2019-06-15 NOTE — Progress Notes (Signed)
Oncology Nurse Navigator Documentation   To provide support, encouragement and care continuity, met with Albert Flowers for his initial RT.  He was accompanied by his wife.  He completed completed treatment without difficulty, denied questions/concerns.  I escorted him to Nursing for post-SIM education and PUT with Dr. Isidore Moos.  I reviewed the registration/arrival procedure for subsequent treatments.  I encouraged them to call me with questions/concerns as tmts proceed.  Gayleen Orem, RN, BSN Head & Neck Oncology Nurse [place] at [place] [phone]

## 2019-06-16 ENCOUNTER — Other Ambulatory Visit: Payer: Self-pay

## 2019-06-16 ENCOUNTER — Ambulatory Visit
Admission: RE | Admit: 2019-06-16 | Discharge: 2019-06-16 | Disposition: A | Discharge status: Home / Self Care | Payer: Managed Care, Other (non HMO) | Source: Ambulatory Visit | Attending: Radiation Oncology | Admitting: Radiation Oncology

## 2019-06-16 DIAGNOSIS — C090 Malignant neoplasm of tonsillar fossa: Secondary | ICD-10-CM | POA: Diagnosis not present

## 2019-06-17 ENCOUNTER — Other Ambulatory Visit: Payer: Self-pay

## 2019-06-17 ENCOUNTER — Ambulatory Visit
Admission: RE | Admit: 2019-06-17 | Discharge: 2019-06-17 | Disposition: A | Discharge status: Home / Self Care | Payer: Managed Care, Other (non HMO) | Source: Ambulatory Visit | Attending: Radiation Oncology | Admitting: Radiation Oncology

## 2019-06-17 ENCOUNTER — Encounter: Payer: Self-pay | Admitting: *Deleted

## 2019-06-17 DIAGNOSIS — C090 Malignant neoplasm of tonsillar fossa: Secondary | ICD-10-CM | POA: Diagnosis not present

## 2019-06-17 NOTE — Progress Notes (Signed)
Oncology Nurse Navigator Documentation  Confirmed patient's attendance at tomorrow morning's H&N [place].  He understands to arrive at 9:00 for registration, that he will be directed to Radiation Waiting where he will be met prior to the start of [place].  Gayleen Orem, RN, BSN Head & Neck Oncology Nurse [place] at [place] [phone]

## 2019-06-18 ENCOUNTER — Encounter: Payer: Self-pay | Admitting: *Deleted

## 2019-06-18 ENCOUNTER — Ambulatory Visit: Payer: Managed Care, Other (non HMO)

## 2019-06-18 ENCOUNTER — Ambulatory Visit
Admission: RE | Admit: 2019-06-18 | Discharge: 2019-06-18 | Disposition: A | Discharge status: Home / Self Care | Payer: Managed Care, Other (non HMO) | Source: Ambulatory Visit | Attending: Radiation Oncology | Admitting: Radiation Oncology

## 2019-06-18 ENCOUNTER — Other Ambulatory Visit: Payer: Self-pay

## 2019-06-18 DIAGNOSIS — C090 Malignant neoplasm of tonsillar fossa: Secondary | ICD-10-CM | POA: Insufficient documentation

## 2019-06-18 DIAGNOSIS — R1310 Dysphagia, unspecified: Secondary | ICD-10-CM

## 2019-06-18 DIAGNOSIS — Z510 Encounter for antineoplastic radiation therapy: Secondary | ICD-10-CM | POA: Diagnosis not present

## 2019-06-18 NOTE — Patient Instructions (Signed)
SWALLOWING EXERCISES Do these until 6 months after your last day of radiation, then 2-3 times per week afterwards  1. Effortful Swallows - Press your tongue against the roof of your mouth for 3 seconds, then squeeze the muscles in your neck while you swallow your saliva or a sip of water - Repeat 10-15 times, 2-3 times a day, and use whenever you eat or drink  2. Masako Swallow - swallow with your tongue sticking out - Stick tongue out past your teeth and gently bite tongue with your teeth - Swallow, while holding your tongue with your teeth - Repeat 10-15 times, 2-3 times a day *use a wet spoon if your mouth gets dry*  3. Pitch Raise - Repeat "he", once per second in as high of a pitch as you can - Repeat 20 times, 2-3 times a day  4. Shaker Exercise - head lift - Lie flat on your back in your bed or on a couch without pillows - Raise your head and look at your feet - KEEP YOUR SHOULDERS DOWN - HOLD FOR 45-60 SECONDS, then lower your head back down - Repeat 3 times, 2-3 times a day  5. Mendelsohn Maneuver - "half swallow" exercise - Start to swallow, and keep your Adam's apple up by squeezing hard with the muscles of the throat - Hold the squeeze for 5-7 seconds and then relax - Repeat 10-15 times, 2-3 times a day *use a wet spoon if your mouth gets dry*  6. Chin pushback - Open your mouth  - Place your fist UNDER your chin near your neck - Tuck your chin and push back with your fist for 5 seconds - Repeat 10 times, 2-3 times a day       7.    "Super Swallow"  - Take a breath and hold it  - Bear down (like pushing your bowels)  - Swallow then IMMEDIATELY cough  - Repeat 10 times, 2-3 times a day   

## 2019-06-18 NOTE — Progress Notes (Signed)
Oncology Nurse Navigator Documentation  Met with Mr. Caesar after appt with SLP and RT.  Reinforced importance of conducting swallowing exercises instructed by Glendell Docker this morning to avoid swallowing issues during/after RT.  He voiced understanding.  I encouraged him to call me with needs/concerns as he continues with treatments.  Gayleen Orem, RN, BSN Head & Neck Oncology [place] at [facility] [phone]

## 2019-06-18 NOTE — Therapy (Signed)
[place] [address] [place], [place], [address] Phone: [phone]   Fax:  [phone]  Speech Language Pathology Evaluation  Patient Details  Name: Albert Flowers MRN: [id number] Date of Birth: [birth year] Referring Provider (SLP): [name], MD   Encounter Date: 06/18/2019  End of Session - 06/18/19 1446    Visit Number  1    Number of Visits  7    Date for SLP Re-Evaluation  12/18/19    SLP Start Time  51    SLP Stop Time   1055    SLP Time Calculation (min)  35 min    Activity Tolerance  Patient tolerated treatment well       Past Medical History:  Diagnosis Date  . Diabetes mellitus without complication [facility])     Past Surgical History:  Procedure Laterality Date  . left neck dissection Left 05/04/2019   Left Neck Dissection by Dr. [name] at [facility].   . neck sugery     as a child "had a knot removed from neck" not sure which side.   . RIght neck dissection Right 06/16/2018   TORS and right neck dissection. Dr. [name] at [facility]    There were no vitals filed for this visit.  Subjective Assessment - 06/18/19 0927    Currently in Pain?  Yes    Pain Score  2     Pain Location  Jaw    Pain Orientation  Left    Pain Descriptors / Indicators  Sore    Pain Type  Acute pain    Pain Onset  In the past 7 days    Aggravating Factors   opening jaw         SLP Evaluation [facility] - 06/18/19 [id number]      SLP Visit Information   SLP Received On  06/18/19    Referring Provider (SLP)  [name], MD    Onset Date  January 2021    Medical Diagnosis  Base of tongue CA      Subjective   Patient/Family Stated Goal  Mantain normal swallowing function      General Information   HPI  Pt presented 12 mo ago with rt neck lump which ultimately was treated with TORS at [facility]. AFterwards, agreed to monitor and leave radiation treatment.to any possible recurrence. On 04-07-19 follow up Dr. [name] noted new  lt upper neck lymph node- PET showed single lt cervical lymph node; mild FDG uptake in lt glossotonsillar sulcus; lt tonsillectomy and lt neck dessection 05-04-19 path: lt benign tonsil. 1/25 lymph nodes positive. Treatment plan 30 fx RT to right tonsil, tongue base and bilateral neck nodes      Prior Functional Status   Cognitive/Linguistic Baseline  Within functional limits      Oral Motor/Sensory Function   Overall Oral Motor/Sensory Function  Appears within functional limits for tasks assessed      Motor Speech   Overall Motor Speech  Appears within functional limits for tasks assessed       Pt currently tolerates mostly regular diet with thin liquids.  POs: Pt ate ham sandwich and drank H2O without overt s/s aspiration. Thyroid elevation appeared adequte, and swallows appeared timely. Oral residue noted as minimal/WNL. Pt's swallow deemed WNL at this time.   Because data states the risk for dysphagia during and after radiation treatment is high due to undergoing radiation tx, SLP taught pt about the possibility of reduced/limited ability for PO intake during rad  tx. SLP encouraged pt to continue swallowing POs as far into rad tx as possible, even ingesting POs and/or completing HEP shortly after administration of pain meds. Among other modifications for days when pt cannot functionally swallow, SLP talked about performing only non-swallowing tasks on the handout/HEP, and then adding swallowing tasks back in when it becomes possible to do so.  SLP educated pt re: changes to swallowing musculature after rad tx, and why adherence to dysphagia HEP provided today and PO consumption was necessary to inhibit muscular disuse atrophy and to reduce muscle fibrosis following rad tx. Pt demonstrated understanding of these things to SLP.    SLP then developed a HEP for pt and pt was instructed how to perform exercises involving lingual, vocal, and pharyngeal strengthening. SLP performed each exercise and  pt return demonstrated each exercise. SLP ensured pt performance was correct prior to moving on to next exercise. Pt was instructed to complete this program 2-3 times a day, until 6 months after his last rad tx, then x2-3 a week after that.                SLP Education - 06/18/19 1446    Education Details  HEP procedure, late effects head/neck radiation on swallow ability    Person(s) Educated  Patient    Methods  Explanation;Demonstration;Verbal cues;Handout    Comprehension  Verbalized understanding;Returned demonstration;Verbal cues required;Need further instruction       SLP Short Term Goals - 06/18/19 1449      SLP SHORT TERM GOAL #1   Title  Pt will complete HEP with rare min A over two sessions    Time  2    Period  --   sessions, for all STGs   Status  New      SLP SHORT TERM GOAL #2   Title  Pt will tell SLP rationale for HEP completion    Time  1    Status  New      SLP SHORT TERM GOAL #3   Title  Pt will tell SLP 3 overt s/s aspiration PNA with modified independence    Time  3    Status  New       SLP Long Term Goals - 06/18/19 1450      SLP LONG TERM GOAL #1   Title  Pt will complete HEP with modified independence over 3 visits    Time  4    Period  --   sessions, for all LTGs   Status  New      SLP LONG TERM GOAL #2   Title  Pt will tell SLP when to decr frequency of HEP    Time  5    Status  New       Plan - 06/18/19 1447    Clinical Impression Statement  Pt presents today with WNL/WFL swallowing ability with ham sandwich and water.  No overt s/sx aspiration PNA reported or observed today. Data suggests that as pts progress through rad or chemorad therapy that their swallowing ability will decrease. Also, WNL swallowing is threatened by muscle fibrosis that will likely develop after rad/chemorad is completed. Therefore, SLP developed an indivdualized exercise program to mitigate/eliminate pt's muscle fibrosis following and as a result of,  pt's rad tx. Skilled ST would be beneficial to the pt in order to regularly assess pt's safety with POs and/or need for instrumental swallow assessment, as well as to assess accurate completion of swallowing HEP.  Speech Therapy Frequency  --   once approx every 4 weeks   Duration  --   6 follow up visits   Treatment/Interventions  Aspiration precaution training;Pharyngeal strengthening exercises;Diet toleration management by SLP;Trials of upgraded texture/liquids;Internal/external aids;Patient/family education;SLP instruction and feedback;Compensatory techniques    Potential to Achieve Goals  Good    SLP Home Exercise Plan  provided today    Consulted and Agree with Plan of Care  Patient       Patient will benefit from skilled therapeutic intervention in order to improve the following deficits and impairments:   Dysphagia, unspecified type    Problem List Patient Active Problem List   Diagnosis Date Noted  . Malignant neoplasm of tonsillar fossa ([place]) 06/01/2019  . Malignant neoplasm of base of tongue ([place]) 06/02/2018  . Head and neck cancer ([place]) 05/14/2018    [facility] ,[place], [place]  06/18/2019, 2:52 PM  [place] [address] [place], [place], [address] Phone: [phone]   Fax:  [phone]  Name: Serge Bayerl MRN: [id number] Date of Birth: 01/28/1971

## 2019-06-19 ENCOUNTER — Other Ambulatory Visit: Payer: Self-pay

## 2019-06-19 ENCOUNTER — Ambulatory Visit
Admission: RE | Admit: 2019-06-19 | Discharge: 2019-06-19 | Disposition: A | Discharge status: Home / Self Care | Payer: Managed Care, Other (non HMO) | Source: Ambulatory Visit | Attending: Radiation Oncology | Admitting: Radiation Oncology

## 2019-06-19 DIAGNOSIS — C090 Malignant neoplasm of tonsillar fossa: Secondary | ICD-10-CM | POA: Diagnosis not present

## 2019-06-22 ENCOUNTER — Ambulatory Visit
Admission: RE | Admit: 2019-06-22 | Discharge: 2019-06-22 | Disposition: A | Discharge status: Home / Self Care | Payer: Managed Care, Other (non HMO) | Source: Ambulatory Visit | Attending: Radiation Oncology | Admitting: Radiation Oncology

## 2019-06-22 ENCOUNTER — Other Ambulatory Visit: Payer: Self-pay

## 2019-06-22 DIAGNOSIS — C090 Malignant neoplasm of tonsillar fossa: Secondary | ICD-10-CM | POA: Diagnosis not present

## 2019-06-23 ENCOUNTER — Ambulatory Visit
Admission: RE | Admit: 2019-06-23 | Discharge: 2019-06-23 | Disposition: A | Discharge status: Home / Self Care | Payer: Managed Care, Other (non HMO) | Source: Ambulatory Visit | Attending: Radiation Oncology | Admitting: Radiation Oncology

## 2019-06-23 ENCOUNTER — Other Ambulatory Visit: Payer: Self-pay

## 2019-06-23 DIAGNOSIS — C090 Malignant neoplasm of tonsillar fossa: Secondary | ICD-10-CM | POA: Diagnosis not present

## 2019-06-24 ENCOUNTER — Other Ambulatory Visit: Payer: Self-pay

## 2019-06-24 ENCOUNTER — Ambulatory Visit
Admission: RE | Admit: 2019-06-24 | Discharge: 2019-06-24 | Disposition: A | Discharge status: Home / Self Care | Payer: Managed Care, Other (non HMO) | Source: Ambulatory Visit | Attending: Radiation Oncology | Admitting: Radiation Oncology

## 2019-06-24 DIAGNOSIS — C090 Malignant neoplasm of tonsillar fossa: Secondary | ICD-10-CM | POA: Diagnosis not present

## 2019-06-25 ENCOUNTER — Other Ambulatory Visit: Payer: Self-pay

## 2019-06-25 ENCOUNTER — Ambulatory Visit
Admission: RE | Admit: 2019-06-25 | Discharge: 2019-06-25 | Disposition: A | Discharge status: Home / Self Care | Payer: Managed Care, Other (non HMO) | Source: Ambulatory Visit | Attending: Radiation Oncology | Admitting: Radiation Oncology

## 2019-06-25 DIAGNOSIS — C090 Malignant neoplasm of tonsillar fossa: Secondary | ICD-10-CM | POA: Diagnosis not present

## 2019-06-26 ENCOUNTER — Ambulatory Visit
Admission: RE | Admit: 2019-06-26 | Discharge: 2019-06-26 | Disposition: A | Discharge status: Home / Self Care | Payer: Managed Care, Other (non HMO) | Source: Ambulatory Visit | Attending: Radiation Oncology | Admitting: Radiation Oncology

## 2019-06-26 ENCOUNTER — Other Ambulatory Visit: Payer: Self-pay

## 2019-06-26 DIAGNOSIS — C090 Malignant neoplasm of tonsillar fossa: Secondary | ICD-10-CM | POA: Diagnosis not present

## 2019-06-29 ENCOUNTER — Inpatient Hospital Stay: Payer: Managed Care, Other (non HMO) | Attending: Radiation Oncology | Admitting: Nutrition

## 2019-06-29 ENCOUNTER — Encounter: Payer: Self-pay | Admitting: Nutrition

## 2019-06-29 ENCOUNTER — Other Ambulatory Visit: Payer: Self-pay

## 2019-06-29 ENCOUNTER — Ambulatory Visit
Admission: RE | Admit: 2019-06-29 | Discharge: 2019-06-29 | Disposition: A | Discharge status: Home / Self Care | Payer: Managed Care, Other (non HMO) | Source: Ambulatory Visit | Attending: Radiation Oncology | Admitting: Radiation Oncology

## 2019-06-29 DIAGNOSIS — C090 Malignant neoplasm of tonsillar fossa: Secondary | ICD-10-CM | POA: Diagnosis not present

## 2019-06-29 NOTE — Progress Notes (Signed)
Patient did not show up for nutrition appointment. 

## 2019-06-30 ENCOUNTER — Ambulatory Visit
Admission: RE | Admit: 2019-06-30 | Discharge: 2019-06-30 | Disposition: A | Discharge status: Home / Self Care | Payer: Managed Care, Other (non HMO) | Source: Ambulatory Visit | Attending: Radiation Oncology | Admitting: Radiation Oncology

## 2019-06-30 ENCOUNTER — Other Ambulatory Visit: Payer: Self-pay

## 2019-06-30 DIAGNOSIS — C090 Malignant neoplasm of tonsillar fossa: Secondary | ICD-10-CM | POA: Diagnosis not present

## 2019-07-01 ENCOUNTER — Ambulatory Visit
Admission: RE | Admit: 2019-07-01 | Discharge: 2019-07-01 | Disposition: A | Discharge status: Home / Self Care | Payer: Managed Care, Other (non HMO) | Source: Ambulatory Visit | Attending: Radiation Oncology | Admitting: Radiation Oncology

## 2019-07-01 ENCOUNTER — Other Ambulatory Visit: Payer: Self-pay

## 2019-07-01 DIAGNOSIS — C090 Malignant neoplasm of tonsillar fossa: Secondary | ICD-10-CM | POA: Diagnosis not present

## 2019-07-02 ENCOUNTER — Other Ambulatory Visit: Payer: Self-pay

## 2019-07-02 ENCOUNTER — Ambulatory Visit
Admission: RE | Admit: 2019-07-02 | Discharge: 2019-07-02 | Disposition: A | Discharge status: Home / Self Care | Payer: Managed Care, Other (non HMO) | Source: Ambulatory Visit | Attending: Radiation Oncology | Admitting: Radiation Oncology

## 2019-07-02 DIAGNOSIS — C090 Malignant neoplasm of tonsillar fossa: Secondary | ICD-10-CM | POA: Diagnosis not present

## 2019-07-03 ENCOUNTER — Ambulatory Visit
Admission: RE | Admit: 2019-07-03 | Discharge: 2019-07-03 | Disposition: A | Discharge status: Home / Self Care | Payer: Managed Care, Other (non HMO) | Source: Ambulatory Visit | Attending: Radiation Oncology | Admitting: Radiation Oncology

## 2019-07-03 DIAGNOSIS — C090 Malignant neoplasm of tonsillar fossa: Secondary | ICD-10-CM | POA: Diagnosis not present

## 2019-07-06 ENCOUNTER — Encounter: Payer: Managed Care, Other (non HMO) | Admitting: Nutrition

## 2019-07-06 ENCOUNTER — Other Ambulatory Visit: Payer: Self-pay | Admitting: Radiation Oncology

## 2019-07-06 ENCOUNTER — Other Ambulatory Visit: Payer: Self-pay

## 2019-07-06 ENCOUNTER — Ambulatory Visit
Admission: RE | Admit: 2019-07-06 | Discharge: 2019-07-06 | Disposition: A | Discharge status: Home / Self Care | Payer: Managed Care, Other (non HMO) | Source: Ambulatory Visit | Attending: Radiation Oncology | Admitting: Radiation Oncology

## 2019-07-06 DIAGNOSIS — C090 Malignant neoplasm of tonsillar fossa: Secondary | ICD-10-CM

## 2019-07-06 DIAGNOSIS — C760 Malignant neoplasm of head, face and neck: Secondary | ICD-10-CM

## 2019-07-06 DIAGNOSIS — C01 Malignant neoplasm of base of tongue: Secondary | ICD-10-CM

## 2019-07-06 MED ORDER — SONAFINE EX EMUL
1.0000 "application " | Freq: Two times a day (BID) | CUTANEOUS | Status: DC
  Administered 2019-07-06: 1 via TOPICAL

## 2019-07-06 MED ORDER — SUCRALFATE 1 G PO TABS: ORAL_TABLET | ORAL | 3 refills | Status: DC

## 2019-07-07 ENCOUNTER — Inpatient Hospital Stay: Payer: Managed Care, Other (non HMO) | Admitting: Nutrition

## 2019-07-07 ENCOUNTER — Ambulatory Visit: Payer: Managed Care, Other (non HMO) | Admitting: Nutrition

## 2019-07-07 ENCOUNTER — Other Ambulatory Visit: Payer: Self-pay

## 2019-07-07 ENCOUNTER — Ambulatory Visit
Admission: RE | Admit: 2019-07-07 | Discharge: 2019-07-07 | Disposition: A | Discharge status: Home / Self Care | Payer: Managed Care, Other (non HMO) | Source: Ambulatory Visit | Attending: Radiation Oncology | Admitting: Radiation Oncology

## 2019-07-07 DIAGNOSIS — C090 Malignant neoplasm of tonsillar fossa: Secondary | ICD-10-CM | POA: Diagnosis not present

## 2019-07-07 NOTE — Progress Notes (Signed)
Nutrition follow-up completed with patient who is receiving radiation therapy for base of tongue/tonsil cancer. Patient reports his mouth is sore but he is swallowing fine. He only will drink chocolate Ensure Enlive. He denies other nutrition impact symptoms.  He admits to weight loss but feels his weight is appropriate for his height. Weight was documented as 182.4 pounds.  This is decreased from 193 pounds March 22.  Estimated nutrition needs: 2100-2300 cal, 105-120 g protein, 2.3 L fluid.  Nutrition diagnosis: Food and nutrition related knowledge deficit continues.  Intervention: Educated patient to consume smaller amounts of food more often and minimize weight loss throughout treatment. Reminded patient to use Ensure Enlive to make smoothies or shakes or improve taste.  Provided recipes. Explained sore mouth will likely get worse as radiation progresses and he needs to focus on weight maintenance. Questions were answered.  Teach back method used.  Samples were provided.  Monitoring, evaluation, goals: Patient will work to increase oral intake to minimize further weight loss.  Next visit: Monday, April 26 before radiation therapy.  **Disclaimer: This note was dictated with voice recognition software. Similar sounding words can inadvertently be transcribed and this note may contain transcription errors which may not have been corrected upon publication of note.**

## 2019-07-08 ENCOUNTER — Ambulatory Visit
Admission: RE | Admit: 2019-07-08 | Discharge: 2019-07-08 | Disposition: A | Discharge status: Home / Self Care | Payer: Managed Care, Other (non HMO) | Source: Ambulatory Visit | Attending: Radiation Oncology | Admitting: Radiation Oncology

## 2019-07-08 ENCOUNTER — Other Ambulatory Visit: Payer: Self-pay

## 2019-07-08 DIAGNOSIS — C090 Malignant neoplasm of tonsillar fossa: Secondary | ICD-10-CM | POA: Diagnosis not present

## 2019-07-09 ENCOUNTER — Ambulatory Visit
Admission: RE | Admit: 2019-07-09 | Discharge: 2019-07-09 | Disposition: A | Discharge status: Home / Self Care | Payer: Managed Care, Other (non HMO) | Source: Ambulatory Visit | Attending: Radiation Oncology | Admitting: Radiation Oncology

## 2019-07-09 ENCOUNTER — Other Ambulatory Visit: Payer: Self-pay

## 2019-07-09 DIAGNOSIS — C090 Malignant neoplasm of tonsillar fossa: Secondary | ICD-10-CM | POA: Diagnosis not present

## 2019-07-10 ENCOUNTER — Ambulatory Visit
Admission: RE | Admit: 2019-07-10 | Discharge: 2019-07-10 | Disposition: A | Discharge status: Home / Self Care | Payer: Managed Care, Other (non HMO) | Source: Ambulatory Visit | Attending: Radiation Oncology | Admitting: Radiation Oncology

## 2019-07-10 ENCOUNTER — Other Ambulatory Visit: Payer: Self-pay

## 2019-07-10 DIAGNOSIS — C090 Malignant neoplasm of tonsillar fossa: Secondary | ICD-10-CM | POA: Diagnosis not present

## 2019-07-13 ENCOUNTER — Ambulatory Visit
Admission: RE | Admit: 2019-07-13 | Discharge: 2019-07-13 | Disposition: A | Discharge status: Home / Self Care | Payer: Managed Care, Other (non HMO) | Source: Ambulatory Visit | Attending: Radiation Oncology | Admitting: Radiation Oncology

## 2019-07-13 ENCOUNTER — Inpatient Hospital Stay: Payer: Managed Care, Other (non HMO) | Admitting: Nutrition

## 2019-07-13 ENCOUNTER — Other Ambulatory Visit: Payer: Self-pay

## 2019-07-13 DIAGNOSIS — C090 Malignant neoplasm of tonsillar fossa: Secondary | ICD-10-CM | POA: Diagnosis not present

## 2019-07-13 NOTE — Progress Notes (Signed)
Nutrition follow-up completed with patient and wife prior to radiation therapy. Weight improved and documented as 183.4 pounds on April 26 increased from 182.4 April 19. Patient is drinking a 700 cal smoothie first thing in the morning for breakfast. Usually has soup and Ensure Enlive at lunch and dinner. Reports he is very full after meals. He denies nausea, vomiting, constipation and diarrhea. It does take him a long time to swallow. He has no other concerns.  Nutrition diagnosis: Food and nutrition related knowledge deficit improved.  Intervention: Patient educated to continue strategies for increased calories and protein for weight maintenance. Provided samples and coupons. Questions were answered and teach back method used.  Monitoring, evaluation, goals: Patient will work to increase calories and protein to minimize weight loss.  Next visit: Monday, May 3.  **Disclaimer: This note was dictated with voice recognition software. Similar sounding words can inadvertently be transcribed and this note may contain transcription errors which may not have been corrected upon publication of note.**

## 2019-07-14 ENCOUNTER — Ambulatory Visit
Admission: RE | Admit: 2019-07-14 | Discharge: 2019-07-14 | Disposition: A | Discharge status: Home / Self Care | Payer: Managed Care, Other (non HMO) | Source: Ambulatory Visit | Attending: Radiation Oncology | Admitting: Radiation Oncology

## 2019-07-14 ENCOUNTER — Other Ambulatory Visit: Payer: Self-pay

## 2019-07-14 DIAGNOSIS — C090 Malignant neoplasm of tonsillar fossa: Secondary | ICD-10-CM | POA: Diagnosis not present

## 2019-07-15 ENCOUNTER — Other Ambulatory Visit: Payer: Self-pay

## 2019-07-15 ENCOUNTER — Ambulatory Visit
Admission: RE | Admit: 2019-07-15 | Discharge: 2019-07-15 | Disposition: A | Discharge status: Home / Self Care | Payer: Managed Care, Other (non HMO) | Source: Ambulatory Visit | Attending: Radiation Oncology | Admitting: Radiation Oncology

## 2019-07-15 DIAGNOSIS — C090 Malignant neoplasm of tonsillar fossa: Secondary | ICD-10-CM | POA: Diagnosis not present

## 2019-07-16 ENCOUNTER — Other Ambulatory Visit: Payer: Self-pay

## 2019-07-16 ENCOUNTER — Ambulatory Visit
Admission: RE | Admit: 2019-07-16 | Discharge: 2019-07-16 | Disposition: A | Discharge status: Home / Self Care | Payer: Managed Care, Other (non HMO) | Source: Ambulatory Visit | Attending: Radiation Oncology | Admitting: Radiation Oncology

## 2019-07-16 DIAGNOSIS — C090 Malignant neoplasm of tonsillar fossa: Secondary | ICD-10-CM | POA: Diagnosis not present

## 2019-07-17 ENCOUNTER — Other Ambulatory Visit: Payer: Self-pay

## 2019-07-17 ENCOUNTER — Ambulatory Visit
Admission: RE | Admit: 2019-07-17 | Discharge: 2019-07-17 | Disposition: A | Discharge status: Home / Self Care | Payer: Managed Care, Other (non HMO) | Source: Ambulatory Visit | Attending: Radiation Oncology | Admitting: Radiation Oncology

## 2019-07-17 DIAGNOSIS — C090 Malignant neoplasm of tonsillar fossa: Secondary | ICD-10-CM | POA: Diagnosis not present

## 2019-07-20 ENCOUNTER — Other Ambulatory Visit: Payer: Self-pay | Admitting: Radiation Oncology

## 2019-07-20 ENCOUNTER — Ambulatory Visit
Admission: RE | Admit: 2019-07-20 | Discharge: 2019-07-20 | Disposition: A | Discharge status: Home / Self Care | Payer: Managed Care, Other (non HMO) | Source: Ambulatory Visit | Attending: Radiation Oncology | Admitting: Radiation Oncology

## 2019-07-20 ENCOUNTER — Other Ambulatory Visit: Payer: Self-pay

## 2019-07-20 ENCOUNTER — Inpatient Hospital Stay: Payer: Managed Care, Other (non HMO) | Admitting: Nutrition

## 2019-07-20 DIAGNOSIS — C090 Malignant neoplasm of tonsillar fossa: Secondary | ICD-10-CM | POA: Insufficient documentation

## 2019-07-20 DIAGNOSIS — E119 Type 2 diabetes mellitus without complications: Secondary | ICD-10-CM | POA: Insufficient documentation

## 2019-07-20 DIAGNOSIS — R112 Nausea with vomiting, unspecified: Secondary | ICD-10-CM | POA: Insufficient documentation

## 2019-07-20 DIAGNOSIS — R630 Anorexia: Secondary | ICD-10-CM | POA: Insufficient documentation

## 2019-07-20 DIAGNOSIS — Z681 Body mass index (BMI) 19 or less, adult: Secondary | ICD-10-CM | POA: Insufficient documentation

## 2019-07-20 DIAGNOSIS — G893 Neoplasm related pain (acute) (chronic): Secondary | ICD-10-CM | POA: Insufficient documentation

## 2019-07-20 DIAGNOSIS — Z510 Encounter for antineoplastic radiation therapy: Secondary | ICD-10-CM | POA: Insufficient documentation

## 2019-07-20 DIAGNOSIS — K5909 Other constipation: Secondary | ICD-10-CM | POA: Insufficient documentation

## 2019-07-20 DIAGNOSIS — C01 Malignant neoplasm of base of tongue: Secondary | ICD-10-CM | POA: Insufficient documentation

## 2019-07-20 DIAGNOSIS — R1310 Dysphagia, unspecified: Secondary | ICD-10-CM | POA: Diagnosis not present

## 2019-07-20 MED ORDER — OXYCODONE HCL 5 MG PO TABS: 5.0000 mg | ORAL_TABLET | ORAL | 0 refills | Status: DC | PRN

## 2019-07-20 NOTE — Progress Notes (Signed)
  Nutrition follow-up completed with patient prior to radiation therapy for base of tongue/tonsil cancer. Current weight was decreased and documented as 176.8 pounds today down from 183.9 pounds April 26. Patient has lost 8% of his body weight since March 22. (6 weeks) He reports he is having more difficulty swallowing. He tried Carafate. Reports he almost vomited when he drank his smoothie after taking Carafate He is trying to drink adequate fluids.  Nutrition diagnosis: Food and nutrition related knowledge deficit improved.  Intervention: Patient educated to increase oral nutrition supplements to 5 bottles of Ensure Plus/Ensure Enlive daily.  He should continue 1 smoothie a day. Encourage soft foods. Recommended increased hydration as tolerated. Provided education on dehydration.  I gave him a fact sheet. Questions were answered.  Teach back method used.  Monitoring, evaluation, goals: Patient will tolerate adequate calories and protein to minimize further weight loss and promote healing.  Next visit: Tuesday, May 18 by telephone with Jennet Maduro.  **Disclaimer: This note was dictated with voice recognition software. Similar sounding words can inadvertently be transcribed and this note may contain transcription errors which may not have been corrected upon publication of note.**

## 2019-07-21 ENCOUNTER — Other Ambulatory Visit: Payer: Self-pay

## 2019-07-21 ENCOUNTER — Ambulatory Visit
Admission: RE | Admit: 2019-07-21 | Discharge: 2019-07-21 | Disposition: A | Discharge status: Home / Self Care | Payer: Managed Care, Other (non HMO) | Source: Ambulatory Visit | Attending: Radiation Oncology | Admitting: Radiation Oncology

## 2019-07-21 DIAGNOSIS — R1310 Dysphagia, unspecified: Secondary | ICD-10-CM | POA: Diagnosis not present

## 2019-07-21 NOTE — Progress Notes (Signed)
Oncology Nurse Navigator Documentation  I met with Albert Flowers after his radiation treatment today to remind him of his scheduled appointment with Garald Balding SLP on Thursday 07/22/19 at 2:45. I advised him to arrive at 2:30 to allow time for registration. He will then proceed to radiation after his appointment with Glendell Docker. He voiced his understanding and know to call me with any questions or concerns.   Harlow Asa RN, BSN, OCN Head & Neck Oncology Nurse [place] at [facility] Phone # [phone]  Fax # [phone]

## 2019-07-22 ENCOUNTER — Other Ambulatory Visit: Payer: Self-pay

## 2019-07-22 ENCOUNTER — Ambulatory Visit
Admission: RE | Admit: 2019-07-22 | Discharge: 2019-07-22 | Disposition: A | Discharge status: Home / Self Care | Payer: Managed Care, Other (non HMO) | Source: Ambulatory Visit | Attending: Radiation Oncology | Admitting: Radiation Oncology

## 2019-07-22 DIAGNOSIS — R1310 Dysphagia, unspecified: Secondary | ICD-10-CM | POA: Diagnosis not present

## 2019-07-23 ENCOUNTER — Ambulatory Visit: Payer: Managed Care, Other (non HMO) | Attending: Radiation Oncology

## 2019-07-23 ENCOUNTER — Ambulatory Visit
Admission: RE | Admit: 2019-07-23 | Discharge: 2019-07-23 | Disposition: A | Discharge status: Home / Self Care | Payer: Managed Care, Other (non HMO) | Source: Ambulatory Visit | Attending: Radiation Oncology | Admitting: Radiation Oncology

## 2019-07-23 ENCOUNTER — Other Ambulatory Visit: Payer: Self-pay

## 2019-07-23 DIAGNOSIS — R1310 Dysphagia, unspecified: Secondary | ICD-10-CM | POA: Diagnosis not present

## 2019-07-23 NOTE — Progress Notes (Signed)
Oncology Nurse Navigator Documentation  I met with Albert Flowers today before his [facility] appointment with Garald Balding SLP. I escorted him to the appointment and then to the [facility] to receive his radiation as scheduled. He is aware that he can call me if he has any questions or concerns.   Harlow Asa RN, BSN, OCN Head & Neck Oncology Nurse [place] at [facility] Phone # [phone]  Fax # [phone]

## 2019-07-23 NOTE — Therapy (Signed)
[place] [address] [place], [place], [address] Phone: [phone]   Fax:  [phone]  Speech Language Pathology Treatment  Patient Details  Name: Albert Flowers MRN: [id number] Date of Birth: [birth year] Referring Provider (SLP): [name], MD   Encounter Date: 07/23/2019  End of Session - 07/23/19 1548    Visit Number  2    Number of Visits  7    Date for SLP Re-Evaluation  12/18/19    SLP Start Time  1450    SLP Stop Time   [id number]    SLP Time Calculation (min)  25 min       Past Medical History:  Diagnosis Date  . Diabetes mellitus without complication [facility])     Past Surgical History:  Procedure Laterality Date  . left neck dissection Left 05/04/2019   Left Neck Dissection by Dr. [name] at [facility].   . neck sugery     as a child "had a knot removed from neck" not sure which side.   . RIght neck dissection Right 06/16/2018   TORS and right neck dissection. Dr. [name] at [facility]    There were no vitals filed for this visit.  Subjective Assessment - 07/23/19 1539    Subjective  "My last radiation session is tomorrow."            ADULT SLP TREATMENT - 07/23/19 1539      General Information   Behavior/Cognition  Alert;Pleasant mood;Cooperative      Treatment Provided   Treatment provided  Dysphagia      Dysphagia Treatment   Temperature Spikes Noted  No    Respiratory Status  Room air    Oral Cavity - Dentition  Adequate natural dentition    Treatment Methods  Skilled observation;Therapeutic exercise;Patient/caregiver education    Patient observed directly with PO's  Yes    Type of PO's observed  Thin liquids    Feeding  Able to feed self    Liquids provided via  Cup    Oral Phase Signs & Symptoms  --   none noted   Pharyngeal Phase Signs & Symptoms  Other (comment)   no coughing on 3 sips prior to HEP, "wrong pipe" ~50% afterw   Other treatment/comments  Pt last rad tx tomorrow. Pt  took 3 sips water without coughing, after coming into ST treatment room and begining HEP. During HEP, pt demonstrated some coughing. Pt endorsed "wrong pipe" feeling approx 50% of the time during that time, and other times pt reports coughing occurred due to gagging/discomfort. Pt could be experiencing some degree of trace/mild penetration or aspiration with liquids due to edema. He denied all overt s/sx aspiration PNA. Pt reported to SLP he had been consistent with HEP as prescribed, now doing non-swallowing exercises more often. SLP encouargaed pt to add back in swallowing exercises as he can, one rep each for sets instead of 10 of one then moving on to the next. He told SLP rationale for HEP correctly.       Assessment / Recommendations / Plan   Plan  Continue with current plan of care      Dysphagia Recommendations   Diet recommendations  --   as tolerated   Liquids provided via  Cup   or bottle   Medication Administration  --   as tolerated     Progression Toward Goals   Progression toward goals  Progressing toward goals       SLP Education -  07/23/19 1547    Education Details  s/sx aspiration PNA, add swallow exercises back into HEP regimen as able    Person(s) Educated  Patient    Methods  Explanation    Comprehension  Verbalized understanding;Need further instruction       SLP Short Term Goals - 07/23/19 1507      SLP SHORT TERM GOAL #1   Title  Pt will complete HEP with rare min A over two sessions    Baseline  07-23-19    Time  1    Period  --   sessions, for all STGs   Status  On-going      SLP SHORT TERM GOAL #2   Title  Pt will tell SLP rationale for HEP completion    Status  Achieved      SLP SHORT TERM GOAL #3   Title  Pt will tell SLP 3 overt s/s aspiration PNA with modified independence    Status  Achieved       SLP Long Term Goals - 07/23/19 1537      SLP LONG TERM GOAL #1   Title  Pt will complete HEP with modified independence over 3 visits     Baseline  07-23-19    Time  3    Period  --   sessions, for all LTGs   Status  On-going      SLP LONG TERM GOAL #2   Title  Pt will tell SLP when to decr frequency of HEP    Time  4    Status  On-going       Plan - 07/23/19 1549    Clinical Impression Statement  Pt presents today with WNL/WFL swallowing ability with water.  No overt s/sx aspiration PNA reported or observed today. Pt at end of his radiation tx (last visit tomorrow). Data suggests that as pts progress through rad or chemorad therapy that their swallowing ability will decrease. Also, WNL swallowing is threatened by muscle fibrosis that will likely develop after rad/chemorad is completed. Therefore, SLP developed an indivdualized exercise program to mitigate/eliminate pt's muscle fibrosis following and as a result of, pt's rad tx. Skilled ST would cont to be beneficial to the pt in order to regularly assess pt's safety with POs and/or need for instrumental swallow assessment, as well as to assess accurate completion of swallowing HEP.    Speech Therapy Frequency  --   once approx every 4 weeks   Duration  --   6 follow up visits   Treatment/Interventions  Aspiration precaution training;Pharyngeal strengthening exercises;Diet toleration management by SLP;Trials of upgraded texture/liquids;Internal/external aids;Patient/family education;SLP instruction and feedback;Compensatory techniques    Potential to Achieve Goals  Good    SLP Home Exercise Plan  provided today    Consulted and Agree with Plan of Care  Patient       Patient will benefit from skilled therapeutic intervention in order to improve the following deficits and impairments:   Dysphagia, unspecified type    Problem List Patient Active Problem List   Diagnosis Date Noted  . Malignant neoplasm of tonsillar fossa ([place]) 06/01/2019  . Malignant neoplasm of base of tongue ([place]) 06/02/2018  . Head and neck cancer ([place]) 05/14/2018    [facility] ,[place],  [place]  07/23/2019, 3:51 PM  [place] [address] [place], [place], [address] Phone: [phone]   Fax:  [phone]   Name: Albert Flowers MRN: [id number] Date of Birth: April 27, 1970

## 2019-07-24 ENCOUNTER — Other Ambulatory Visit: Payer: Self-pay

## 2019-07-24 ENCOUNTER — Encounter: Payer: Self-pay | Admitting: Radiation Oncology

## 2019-07-24 ENCOUNTER — Ambulatory Visit
Admission: RE | Admit: 2019-07-24 | Discharge: 2019-07-24 | Disposition: A | Discharge status: Home / Self Care | Payer: Managed Care, Other (non HMO) | Source: Ambulatory Visit | Attending: Radiation Oncology | Admitting: Radiation Oncology

## 2019-07-24 DIAGNOSIS — R1310 Dysphagia, unspecified: Secondary | ICD-10-CM | POA: Diagnosis not present

## 2019-07-24 NOTE — Progress Notes (Signed)
Oncology Nurse Navigator Documentation  Met with Albert Flowers after final RT to offer support and to celebrate end of radiation treatment.   Provided verbal/written post-RT guidance:  Importance of keeping all follow-up appts, especially those with Nutrition and SLP.  Importance of protecting treatment area from sun.  Continuation of Sonafine application 2-3 times daily, application of antibiotic ointment to areas of raw skin; when supply of Sonafine exhausted transition to OTC lotion with vitamin E. Provided/reviewed Epic calendar of upcoming appts. Explained my role as navigator will continue for several more months, encouraged him to call me with needs/concerns.    Harlow Asa RN, BSN, OCN Head & Neck Oncology Nurse [place] at [facility] Phone # [phone]  Fax # [phone]

## 2019-07-26 ENCOUNTER — Encounter: Payer: Self-pay | Admitting: Radiation Oncology

## 2019-07-26 NOTE — Progress Notes (Signed)
As the on-call physician for radiation oncology I was contacted by the Call Service today. This patient recently completed I am RT for tonsil cancer. He is having difficulty swallowing medication such as MiraLAX. This has resulted in severe constipation. I authorized that the on-call nurse recommend either fleet enema or Dulcolax and auditory at the patient's preference. I confirmed with the patient is following up with a speech therapi st and working intensively on swallowing. I also confirmed that he is receiving nutrition by drinking in sure. He is struggling some with oral pain medicine in terms of swallowing. Advised him to follow up with radiation oncology About other analgesia options, including transdermal analgesia.

## 2019-07-28 NOTE — Progress Notes (Signed)
Oncology Nurse Navigator Documentation  I received a message from [place] regarding Albert Flowers difficulty swallowing and decreased intake since completing radiation on Friday 07/24/19. I called and spoke to his wife at home. She reports that he is unable to tolerate more than 1 ensure daily due to gagging and throwing while attempting to swallow since completing radiation. He has tolerated premier protein slightly better. She estimates that he is drinking 32 ounces of water daily. I notified Dr. Isidore Moos of the above and she recommends that Mr. Albert Flowers see [name] PA in symptom management for evaluation. He will be scheduled for a time tomorrow. I called back and spoke to Mr. Albert Flowers to let him know that he will receive a call for an appointment tomorrow with symptom management. I also instructed him to try to take small sips to decrease gagging when he drinks and to continue drinking premier protein drinks if able. I also encouraged increased water intake. He voiced his appreciation and knows to call me with any further questions or concerns.   Harlow Asa RN, BSN, OCN Head & Neck Oncology Nurse [place] at [facility] Phone # [phone]  Fax # [phone]

## 2019-07-29 ENCOUNTER — Other Ambulatory Visit: Payer: Self-pay | Admitting: Emergency Medicine

## 2019-07-29 ENCOUNTER — Other Ambulatory Visit: Payer: Self-pay

## 2019-07-29 ENCOUNTER — Inpatient Hospital Stay: Payer: Managed Care, Other (non HMO)

## 2019-07-29 ENCOUNTER — Inpatient Hospital Stay (HOSPITAL_BASED_OUTPATIENT_CLINIC_OR_DEPARTMENT_OTHER): Payer: Managed Care, Other (non HMO) | Admitting: Medical

## 2019-07-29 ENCOUNTER — Inpatient Hospital Stay: Payer: Managed Care, Other (non HMO) | Admitting: Nutrition

## 2019-07-29 VITALS — BP 119/81 | HR 81 | Temp 97.8°F | Resp 18 | Ht 70.0 in | Wt 167.1 lb

## 2019-07-29 DIAGNOSIS — C01 Malignant neoplasm of base of tongue: Secondary | ICD-10-CM

## 2019-07-29 DIAGNOSIS — G893 Neoplasm related pain (acute) (chronic): Secondary | ICD-10-CM

## 2019-07-29 DIAGNOSIS — B370 Candidal stomatitis: Secondary | ICD-10-CM

## 2019-07-29 DIAGNOSIS — K5909 Other constipation: Secondary | ICD-10-CM

## 2019-07-29 DIAGNOSIS — R110 Nausea: Secondary | ICD-10-CM | POA: Diagnosis not present

## 2019-07-29 DIAGNOSIS — R1310 Dysphagia, unspecified: Secondary | ICD-10-CM | POA: Diagnosis not present

## 2019-07-29 LAB — CMP (CANCER CENTER ONLY)
ALT: 15 U/L (ref 0–44)
AST: 17 U/L (ref 15–41)
Albumin: 3.7 g/dL (ref 3.5–5.0)
Alkaline Phosphatase: 103 U/L (ref 38–126)
Anion gap: 12 (ref 5–15)
BUN: 20 mg/dL (ref 6–20)
CO2: 28 mmol/L (ref 22–32)
Calcium: 9.9 mg/dL (ref 8.9–10.3)
Chloride: 99 mmol/L (ref 98–111)
Creatinine: 0.99 mg/dL (ref 0.61–1.24)
GFR, Est AFR Am: >60 mL/min (ref >60)
GFR, Estimated: >60 mL/min (ref >60)
Glucose, Bld: 158 mg/dL — ABNORMAL HIGH (ref 70–99)
Potassium: 4.4 mmol/L (ref 3.5–5.1)
Sodium: 139 mmol/L (ref 135–145)
Total Bilirubin: 0.4 mg/dL (ref 0.3–1.2)
Total Protein: 7.6 g/dL (ref 6.5–8.1)

## 2019-07-29 LAB — CBC WITH DIFFERENTIAL (CANCER CENTER ONLY)
Abs Immature Granulocytes: 0.03 10*3/uL (ref 0.00–0.07)
Basophils Absolute: 0 10*3/uL (ref 0.0–0.1)
Basophils Relative: 0 %
Eosinophils Absolute: 0 10*3/uL (ref 0.0–0.5)
Eosinophils Relative: 1 %
HCT: 42.5 % (ref 39.0–52.0)
Hemoglobin: 14.2 g/dL (ref 13.0–17.0)
Immature Granulocytes: 1 %
Lymphocytes Relative: 3 %
Lymphs Abs: 0.2 10*3/uL — ABNORMAL LOW (ref 0.7–4.0)
MCH: 31 pg (ref 26.0–34.0)
MCHC: 33.4 g/dL (ref 30.0–36.0)
MCV: 92.8 fL (ref 80.0–100.0)
Monocytes Absolute: 0.8 10*3/uL (ref 0.1–1.0)
Monocytes Relative: 13 %
Neutro Abs: 5.1 10*3/uL (ref 1.7–7.7)
Neutrophils Relative %: 82 %
Platelet Count: 289 10*3/uL (ref 150–400)
RBC: 4.58 MIL/uL (ref 4.22–5.81)
RDW: 11.8 % (ref 11.5–15.5)
WBC Count: 6.1 10*3/uL (ref 4.0–10.5)
nRBC: 0 % (ref 0.0–0.2)

## 2019-07-29 LAB — MAGNESIUM: Magnesium: 1.8 mg/dL (ref 1.7–2.4)

## 2019-07-29 MED ORDER — ONDANSETRON 8 MG PO TBDP: 8.0000 mg | ORAL_TABLET | Freq: Three times a day (TID) | ORAL | 2 refills | Status: DC | PRN

## 2019-07-29 MED ORDER — LORAZEPAM 0.5 MG PO TABS: ORAL_TABLET | ORAL | 0 refills | Status: DC

## 2019-07-29 MED ORDER — NYSTATIN 100000 UNIT/ML MT SUSP: 5.0000 mL | Freq: Four times a day (QID) | OROMUCOSAL | 0 refills | Status: DC

## 2019-07-29 MED ORDER — OXYCODONE HCL 5 MG/5ML PO SOLN: ORAL | 0 refills | Status: DC

## 2019-07-29 NOTE — Patient Instructions (Signed)
Cepacol Spray (Over-The-Counter) Numbing Spray     Constipation Management  Magnesium Citrate, drink 1/2 bottle, drink remainder if no bowel movement with 30 to 60 minutes  Or  30 mg (1 tablespoon) of Milk of Magnesia in 8 ounces of prune juice, warm in microwave for 20 seconds    Begin the following after you have had a bowel movement:  Senna-S, 1 to 2 tablets twice daily  MiraLAX 17 grams in 8 ounces of liquids 1 to 2 times daily as needed   Remember to remain well hydrated. Drink, Drink, Drink non-caffeinated beverages.   Adjust these medications based on your response. If your bowel movements become too loose then decrease the amount of Senna-S and/or MiraLAX that you are using. If your bowel movements become too firm or are difficult to pass, the increase the amount of Senna-S and/or MiraLAX that you are using and increase your intake of water.   NEVER, NEVER, NEVER use an enema or suppositories unless your provider has given their approval.

## 2019-07-29 NOTE — Progress Notes (Signed)
Nutrition follow-up completed and symptom management clinic. 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 Patient last seen by nutrition on May 3 with weight documented as 176.8 pounds.  Today patient weighed 167.1 pounds which is a 5% weight loss over 1 week which is significant. Patient reports he is having difficulty swallowing and reports his mouth is sore. He complains of dry heaves especially when food passes through his throat. He does not have nausea medication. He does not like to use lidocaine or carafate because he says it doesn't help. Reports Ensure Jeanne Ivan is too thick and he tolerated premier protein better. Last BM was 5/9 after a suppository. Labs reviewed and noted glucose 158.  Estimated Nutrition Needs: 2100-2300 kcal, 105-120 grams pro, 2.3 L fluid.  Nutrition Diagnosis: Food and Nutrition Related Knowledge Deficit continues.  Intervention: Educated patient to alter consistency of beverages to be thinner if needed. Encouraged him to explore different food and beverage temperatures. Recommended 6 bottles of Ensure Enlive to meet estimated needs if unable to tolerate foods. Recommended increase fluids as tolerated. Explained importance of nutrition for healing. Encouraged bowel regimen to avoid constipation.  Monitoring, Evaluation, Goals: Patient will work to increase oral intake and fluids to promote healing and avoid dehydration.  Next Visit: Scheduled on May 18 with Jennet Maduro by telephone.  **Disclaimer: This note was dictated with voice recognition software. Similar sounding words can inadvertently be transcribed and this note may contain transcription errors which may not have been corrected upon publication of note.**

## 2019-07-31 NOTE — Progress Notes (Signed)
Symptoms Management Clinic Progress Note   [name] [id number] [birth year] 49 y.o.  [name] is managed by Dr. Tish Men and Dr. [name]  Actively treated with chemotherapy/immunotherapy/hormonal therapy: Mr. [name] completed radiation therapy on 07/24/2019  Next scheduled appointment with provider: 08/07/2019 (Dr. Isidore Moos)  Assessment: Plan:    Neoplasm related pain - Plan: oxyCODONE (ROXICODONE) 5 MG/5ML solution  Other constipation  Nausea without vomiting - Plan: ondansetron (ZOFRAN ODT) 8 MG disintegrating tablet, LORazepam (ATIVAN) 0.5 MG tablet  Thrush, oral - Plan: nystatin (MYCOSTATIN) 100000 UNIT/ML suspension  Malignant neoplasm of base of tongue ([place])   Neoplasm related pain: [name] reports having oral pain.  He was given a prescription for Roxicodone 5 mg / 5 mL with instructions that he can use this every 4 hours as needed.  Constipation: Patient was given information regarding management of constipation.  Nausea: [name] was given Zofran 8 mg ODT tablets to be used sublingually every 8 hours as needed.  Additionally was given Ativan 0.5 mg sublingual every 6 hours as needed for nausea  Thrush: [name] was given nystatin swish and spit.  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 is status post radiation therapy which she completed on 07/24/2019.  He will see Dr. Isidore Moos in follow-up on 08/07/2019.  Please see After Visit Summary for patient specific instructions.  Future Appointments  Date Time Provider [place]  08/04/2019  1:45 PM Jennet Maduro, RD [facility] None  08/07/2019  2:40 PM [name], MD [facility] None  08/12/2019 10:30 AM Karie Mainland, RD [facility] None  08/19/2019 10:30 AM Karie Mainland, RD [facility] None  08/20/2019  2:45 PM Schinke, [place], CCC-SLP [facility]    No orders of the defined types were placed in this encounter.      Subjective:   Patient ID:  Albert Flowers is a 49 y.o. (DOB 09/18/1970) male.  Chief Complaint:  Chief Complaint  Patient presents with  . Fatigue    HPI Albert Flowers  is a 49 y.o. male with a diagnosis of malignant neoplasm of the base of the tongue.  He is managed by Dr. [name] and is status post radiation therapy which she completed on 07/24/2019.  He presents to the clinic today with his son.  He is also being seen by nutrition today.  He reports that he has anorexia and has mainly been sustaining himself on ensures.  He reports that he has been gagging when he attempts to swallow anything.  He has a sore throat and mouth pain.  He reports having some mild constipation for which he takes over-the-counter agents.  He has nausea and has been vomiting.  He denies dizziness, falls, chest pain, shortness of breath, fevers, chills, sweats, or diarrhea.   Medications: I have reviewed the patient's current medications.  Allergies:  Allergies  Allergen Reactions  . Penicillins Other (See Comments)    Unknown reaction    Past Medical History:  Diagnosis Date  . Diabetes mellitus without complication [facility])     Past Surgical History:  Procedure Laterality Date  . left neck dissection Left 05/04/2019   Left Neck Dissection by Dr. [name] at [facility].   . neck sugery     as a child "had a knot removed from neck" not sure which side.   . RIght neck dissection Right 06/16/2018   TORS and right neck dissection. Dr. [name] at [facility]    No family history on  file.  Social History   Socioeconomic History  . Marital status: Married    Spouse name: Not on file  . Number of children: 4  . Years of education: Not on file  . Highest education level: Not on file  Occupational History  . Not on file  Tobacco Use  . Smoking status: Never Smoker  . Smokeless tobacco: Never Used  Substance and Sexual Activity  . Alcohol use: Yes    Comment: occasional  . Drug use: Never  . Sexual activity: Not on file  Other Topics  Concern  . Not on file  Social History Narrative  . Not on file   Social Determinants of Health   Financial Resource Strain:   . Difficulty of Paying Living Expenses:   Food Insecurity:   . Worried About Charity fundraiser in the Last Year:   . Arboriculturist in the Last Year:   Transportation Needs:   . Film/video editor (Medical):   Marland Kitchen Lack of Transportation (Non-Medical):   Physical Activity:   . Days of Exercise per Week:   . Minutes of Exercise per Session:   Stress:   . Feeling of Stress :   Social Connections:   . Frequency of Communication with Friends and Family:   . Frequency of Social Gatherings with Friends and Family:   . Attends Religious Services:   . Active Member of Clubs or Organizations:   . Attends Archivist Meetings:   Marland Kitchen Marital Status:   Intimate Partner Violence:   . Fear of Current or Ex-Partner:   . Emotionally Abused:   Marland Kitchen Physically Abused:   . Sexually Abused:     Past Medical History, Surgical history, Social history, and Family history were reviewed and updated as appropriate.   Please see review of systems for further details on the patient's review from today.   Review of Systems:  Review of Systems  Constitutional: Positive for appetite change. Negative for chills, diaphoresis, fever and unexpected weight change.  HENT: Positive for mouth sores, sore throat and trouble swallowing.   Respiratory: Negative for chest tightness and shortness of breath.   Gastrointestinal: Positive for constipation. Negative for abdominal distention, abdominal pain, anal bleeding, blood in stool, diarrhea, nausea, rectal pain and vomiting.  Genitourinary: Negative for difficulty urinating, dysuria and frequency.  Neurological: Negative for dizziness.    Objective:   Physical Exam:  BP 119/81 (BP Location: Left Arm, Patient Position: Sitting)   Pulse 81   Temp 97.8 F (36.6 C) (Temporal)   Resp 18   Ht 5\' 10"  (1.778 m)   Wt 75.8 kg  (167 lb 1.6 oz)   SpO2 100%   BMI 23.98 kg/m  ECOG: 1  Physical Exam Constitutional:      General: He is not in acute distress.    Appearance: He is not diaphoretic.  HENT:     Head: Normocephalic and atraumatic.     Mouth/Throat:     Mouth: Mucous membranes are moist.     Pharynx: Posterior oropharyngeal erythema present.     Comments: Multiple areas of white plaque over the tongue oral mucosa consistent with thrush. Eyes:     General: No scleral icterus.       Right eye: No discharge.        Left eye: No discharge.     Conjunctiva/sclera: Conjunctivae normal.  Neck:     Comments: Erythema and scaling of the neck consistent with a radiation field.  Cardiovascular:     Rate and Rhythm: Normal rate and regular rhythm.     Heart sounds: Normal heart sounds. No murmur. No friction rub. No gallop.   Pulmonary:     Effort: Pulmonary effort is normal. No respiratory distress.     Breath sounds: Normal breath sounds. No stridor. No wheezing or rales.  Skin:    General: Skin is warm and dry.     Findings: No erythema or rash.  Neurological:     General: No focal deficit present.     Mental Status: He is alert.     Gait: Gait normal.  Psychiatric:        Mood and Affect: Mood normal.        Behavior: Behavior normal.        Thought Content: Thought content normal.        Judgment: Judgment normal.     Lab Review:     Component Value Date/Time   NA 139 07/29/2019 1316   K 4.4 07/29/2019 1316   CL 99 07/29/2019 1316   CO2 28 07/29/2019 1316   GLUCOSE 158 (H) 07/29/2019 1316   BUN 20 07/29/2019 1316   CREATININE 0.99 07/29/2019 1316   CALCIUM 9.9 07/29/2019 1316   PROT 7.6 07/29/2019 1316   ALBUMIN 3.7 07/29/2019 1316   AST 17 07/29/2019 1316   ALT 15 07/29/2019 1316   ALKPHOS 103 07/29/2019 1316   BILITOT 0.4 07/29/2019 1316   GFRNONAA >60 07/29/2019 1316   GFRAA >60 07/29/2019 1316       Component Value Date/Time   WBC 6.1 07/29/2019 1316   WBC 5.7 06/04/2018  1446   RBC 4.58 07/29/2019 1316   HGB 14.2 07/29/2019 1316   HCT 42.5 07/29/2019 1316   PLT 289 07/29/2019 1316   MCV 92.8 07/29/2019 1316   MCH 31.0 07/29/2019 1316   MCHC 33.4 07/29/2019 1316   RDW 11.8 07/29/2019 1316   LYMPHSABS 0.2 (L) 07/29/2019 1316   MONOABS 0.8 07/29/2019 1316   EOSABS 0.0 07/29/2019 1316   BASOSABS 0.0 07/29/2019 1316   -------------------------------  Imaging from last 24 hours (if applicable):  Radiology interpretation: No results found.

## 2019-08-04 ENCOUNTER — Inpatient Hospital Stay: Payer: Managed Care, Other (non HMO)

## 2019-08-04 NOTE — Progress Notes (Signed)
Nutrition Follow-up:  Patient with base of tongue/tonsil cancer.   Completed radaition on 07/24/2019.    Spoke with patient via phone for nutrition follow-up.  Patient reports that his nausea/vomiting is improved.  Typically takes nausea medications in the am only.  Reports that he is drinking about 4 ensure enlive per day mixed with about 1 cup of water. Knows that he needs to get up to 5 ensure enlive per day.  Has tried some grits but did not swallow them very well.  Patient is not drinking smoothie.     Medications: zofran, ativan, carafate  Labs: reviewed  Anthropometrics:   167 lb 1.6 oz on 5/12 decreased from 176 lb on 5/3.    NUTRITION DIAGNOSIS:  Inadequate oral intake related to cancer related treatment side effects as evidenced by weight loss.    INTERVENTION:  Discussed importance of continuing nausea medications. Recommend increase to 5 ensure enlive daily.  Will leave case of ensure enlive for patient to pick up on Friday 5/21.   Discussed soft, smooth, liquidy foods to try (pudding, ice cream, smooth creamy soups) Contact information provided    MONITORING, EVALUATION, GOAL: Patient will tolerate adequate calories and protein to minimize further weight loss and promote healing.    NEXT VISIT: Wednesday, May 26th with Raford Pitcher, phone f/u  Cordel Drewes B. Zenia Resides, [place], [place] Registered Dietitian [phone] (pager)

## 2019-08-06 NOTE — Progress Notes (Signed)
Albert Flowers presents today for 2 week follow-up after completing radiation to the right tonsil on 07/24/2019  Pain issues, if any: Patient denies Using a feeding tube?: N/A Weight changes, if any: Saw Albert Flowers on 08/04/2019  Wt Readings from Last 3 Encounters:  08/07/19 171 lb 9.6 oz (77.8 kg)  07/29/19 167 lb 1.6 oz (75.8 kg)  07/20/19 176 lb 12.8 oz (80.2 kg)   Swallowing issues, if any: Patient is able to swallow, but has a lot of anxiety/hesitency to swallow. Still dealing with dry mouth and thick saliva/mucus (especially as night) Smoking or chewing tobacco? N/A Using fluoride trays daily? Unable to tolerate Last ENT visit was on: Scheduled to see Albert Flowers soon. Other notable issues, if any: Skin is healing well in treatment field. Still struggling with sensitive gag reflex. Saw Albert Lei Flowers last week about issues with nausea, thrush, and tongue sores. He reports that all 3 issues are improving  Vitals:   08/07/19 1501  BP: 109/80  Pulse: 72  Resp: 20  Temp: 97.9 F (36.6 C)  SpO2: 100%

## 2019-08-07 ENCOUNTER — Encounter: Payer: Self-pay | Admitting: Radiation Oncology

## 2019-08-07 ENCOUNTER — Ambulatory Visit
Admission: RE | Admit: 2019-08-07 | Discharge: 2019-08-07 | Disposition: A | Discharge status: Home / Self Care | Payer: Managed Care, Other (non HMO) | Source: Ambulatory Visit | Attending: Radiation Oncology | Admitting: Radiation Oncology

## 2019-08-07 ENCOUNTER — Other Ambulatory Visit: Payer: Self-pay

## 2019-08-07 VITALS — BP 109/80 | HR 72 | Temp 97.9°F | Resp 20 | Ht 70.0 in | Wt 171.6 lb

## 2019-08-07 DIAGNOSIS — K1230 Oral mucositis (ulcerative), unspecified: Secondary | ICD-10-CM | POA: Diagnosis not present

## 2019-08-07 DIAGNOSIS — Z7982 Long term (current) use of aspirin: Secondary | ICD-10-CM | POA: Insufficient documentation

## 2019-08-07 DIAGNOSIS — C090 Malignant neoplasm of tonsillar fossa: Secondary | ICD-10-CM | POA: Diagnosis not present

## 2019-08-07 DIAGNOSIS — F419 Anxiety disorder, unspecified: Secondary | ICD-10-CM | POA: Diagnosis not present

## 2019-08-07 DIAGNOSIS — Z7984 Long term (current) use of oral hypoglycemic drugs: Secondary | ICD-10-CM | POA: Insufficient documentation

## 2019-08-07 DIAGNOSIS — R682 Dry mouth, unspecified: Secondary | ICD-10-CM | POA: Insufficient documentation

## 2019-08-07 DIAGNOSIS — C01 Malignant neoplasm of base of tongue: Secondary | ICD-10-CM

## 2019-08-07 DIAGNOSIS — Z79899 Other long term (current) drug therapy: Secondary | ICD-10-CM | POA: Insufficient documentation

## 2019-08-07 DIAGNOSIS — Z923 Personal history of irradiation: Secondary | ICD-10-CM | POA: Diagnosis present

## 2019-08-07 NOTE — Progress Notes (Signed)
Radiation Oncology         [phone] ________________________________  Name: Albert Flowers MRN: [id number]  Date: 08/07/2019  DOB: 1970-10-24  Follow-Up Visit Note  CC: Albert Shanks, MD  Albert Ames, MD  Diagnosis and Prior Radiotherapy:       ICD-10-CM   1. Malignant neoplasm of tonsillar fossa ([place])  C09.0 CT Soft Tissue Neck W Contrast    CT Chest W Contrast    BUN & Creatinine ([place])  2. Malignant neoplasm of base of tongue ([place])  C01     CHIEF COMPLAINT:  Here for follow-up and surveillance of throat cancer  Narrative:  The patient returns today for routine follow-up.   Albert Flowers presents today for 2 week follow-up after completing radiation to the right tonsil on 07/24/2019  Pain issues, if any: Patient denies Using a feeding tube?: N/A Weight changes, if any: Slight loss - saw Albert Flowers on 08/04/2019  Wt Readings from Last 3 Encounters:  08/07/19 171 lb 9.6 oz (77.8 kg)  07/29/19 167 lb 1.6 oz (75.8 kg)  07/20/19 176 lb 12.8 oz (80.2 kg)   Swallowing issues, if any: Patient is able to swallow, but has a lot of anxiety/hesitency to swallow as sometimes he gags.  Albert Mealy  PA recently started nystatin for possible thrush as well as Zofran.  The Zofran is helping with his gag reflex.  He is still dealing with dry mouth and thick saliva/mucus (especially as night)  Smoking or chewing tobacco? N/A Using fluoride trays daily? Unable to tolerate Last ENT visit was on: Scheduled to see Dr. Francina Flowers soon. Other notable issues, if any: Skin is healing well in treatment field. Still struggling with sensitive gag reflex. Saw Albert Flowers last week about issues with nausea, thrush, and tongue sores. He reports that all 3 issues are improving.   He is still working.                      ALLERGIES:  is allergic to penicillins.  Meds: Current Outpatient Medications  Medication Sig Dispense Refill  . acetaminophen (TYLENOL) 500 MG tablet Take 500 mg  by mouth every 6 (six) hours as needed.    Marland Kitchen aspirin EC 81 MG tablet Take 81 mg by mouth daily.    . celecoxib (CELEBREX) 100 MG capsule Take 100 mg by mouth 2 (two) times daily.    Marland Kitchen LORazepam (ATIVAN) 0.5 MG tablet 0.5 mg SL q 6 hours prn nausea. Do not drive after taking this medication. 30 tablet 0  . metFORMIN (GLUCOPHAGE) 1000 MG tablet Take 1,000 mg by mouth 2 (two) times daily with a meal.     . nystatin (MYCOSTATIN) 100000 UNIT/ML suspension Take 5 mLs (500,000 Units total) by mouth 4 (four) times daily. Swish and spit or swallow until better. 240 mL 0  . ondansetron (ZOFRAN ODT) 8 MG disintegrating tablet Take 1 tablet (8 mg total) by mouth every 8 (eight) hours as needed for nausea or vomiting. 20 tablet 2  . oxyCODONE (ROXICODONE) 5 MG/5ML solution 5 to 10 mg PO q 4 hours prn oral and throat pain 240 mL 0  . rosuvastatin (CRESTOR) 20 MG tablet     . lidocaine (XYLOCAINE) 2 % solution Patient: Mix 1part 2% viscous lidocaine, 1part H20. Swish & swallow 58mL of diluted mixture, 69min before meals and at bedtime, up to QID (Patient not taking: Reported on 08/07/2019) 200 mL 4  . sodium fluoride (PREVIDENT 5000 PLUS)  1.1 % CREA dental cream Patient to use as directed on a toothbrush or alternatively in his fluoride trays as instructed.  Patient is to spit out excess and not swallow.  Repeat nightly. (Patient not taking: Reported on 08/07/2019) 2 Tube prn  . sucralfate (CARAFATE) 1 g tablet Dissolve 1 tablet in 10 mL H20 and swallow 20 min prior to meals and bedtime. (Patient not taking: Reported on 08/07/2019) 40 tablet 3   No current facility-administered medications for this encounter.    Physical Findings: The patient is in no acute distress. Patient is alert and oriented. Wt Readings from Last 3 Encounters:  08/07/19 171 lb 9.6 oz (77.8 kg)  07/29/19 167 lb 1.6 oz (75.8 kg)  07/20/19 176 lb 12.8 oz (80.2 kg)    height is 5\' 10"  (1.778 m) and weight is 171 lb 9.6 oz (77.8 kg). His  temperature is 97.9 F (36.6 C). His blood pressure is 109/80 and his pulse is 72. His respiration is 20 and oxygen saturation is 100%. .  General: Alert and oriented, in no acute distress HEENT: Head is normocephalic. Extraocular movements are intact. Oropharynx is notable for no visible tumor, tongue has a nonspecific white coating that is mucositis versus residual thrush (favor mucositis) Neck: Neck is notable for mild erythema, skin intact; surgical scars intact Skin: Skin in treatment fields shows satisfactory healing as above Psychiatric: Judgment and insight are intact. Affect is appropriate.   Lab Findings: Lab Results  Component Value Date   WBC 6.1 07/29/2019   HGB 14.2 07/29/2019   HCT 42.5 07/29/2019   MCV 92.8 07/29/2019   PLT 289 07/29/2019    Lab Results  Component Value Date   TSH 1.621 06/04/2018    Radiographic Findings: No results found.  Impression/Plan:    1) Head and Neck Cancer Status: Healing from adjuvant radiotherapy  2) Nutritional Status: He is working through his gag reflex.  Talked about nutritional options that may be better tolerated.  He has avoided a feeding tube  3) ? Thrush: Continue nystatin.  Discussed symptoms of progressive thrush and reasons to call for consideration of other medications.  I believe he has resolving mucositis.  4) Swallowing: Functional, see #2  5) Dental: Encouraged to continue regular followup with dentistry, and dental hygiene including fluoride rinses.    6) Thyroid function: We will check annually Lab Results  Component Value Date   TSH 1.621 06/04/2018    7) Other: Follow-up as scheduled with otolaryngology  8) Follow-up in August with restaging scans (CT neck and chest). The patient was encouraged to call with any issues or questions before then.  On date of service, in total, I spent 15 minutes on this encounter.  The patient was seen in person  _____________________________________   [name],  MD

## 2019-08-07 NOTE — Progress Notes (Signed)
Oncology Nurse Navigator Documentation  I met with patient during his 2 week follow up with Dr. Isidore Moos today. His skin is healing well. He is drinking ensure and trying other soft foods to swallow. He will return in August to see Dr. Isidore Moos after a follow up scan if not already done by Dr. [name] at [facility].   Harlow Asa RN, BSN, OCN Head & Neck Oncology Nurse [place] at [facility] Phone # [phone]  Fax # [phone]

## 2019-08-10 ENCOUNTER — Encounter: Payer: Self-pay | Admitting: Radiation Oncology

## 2019-08-11 ENCOUNTER — Telehealth: Payer: Self-pay | Admitting: Radiation Oncology

## 2019-08-12 ENCOUNTER — Inpatient Hospital Stay: Payer: Managed Care, Other (non HMO) | Admitting: Nutrition

## 2019-08-12 ENCOUNTER — Telehealth: Payer: Self-pay | Admitting: Dietician

## 2019-08-12 NOTE — Telephone Encounter (Signed)
Nutrition Follow-up:  Patient with base of tongue/tonsil cancer Completed radiation on 07/24/2019  Spoke with patient via phone for nutrition follow-up today. Patient reports improvements to nausea/vomiting, no longer taking nausea medication. Patient reports drinking 4-5 Ensure daily, he has not tried other soft, smooth foods. Patient reports improving swallowing function, no longer gags when drinking supplement. Patient reports ongoing poor taste with foods, states it is getting a little better.   Medications: Metformin, Zofran, Carafate  Labs: no new labs  Anthropometrics:   Weight on 5/21 was 171 lb 6.9 oz, increased from 167 lb 1.6 oz on 5/12  NUTRITION DIAGNOSIS: Inadequate oral intake related to cancer related treatment side effects improving   INTERVENTION:  Recommended continuing 5 Ensure daily Reviewed soft foods, smooth foods, educated on how to increase calories and protein (preparing soups with  whole fat milk, adding butter to foods, preparing milkshakes using Ensure supplement and peanut butter powder) Provided suggestions on how to enhance flavors of foods    MONITORING, EVALUATION, GOAL: Patient will tolerate adequate calories and protein to minimize further wt loss and promote healing.   NEXT VISIT: Wednesday, June 2 via telephone  Lajuan Lines, RD, LDN Clinical Nutrition After Hours/Weekend Pager # in [facility]

## 2019-08-18 ENCOUNTER — Telehealth: Payer: Self-pay | Admitting: Radiation Oncology

## 2019-08-19 ENCOUNTER — Inpatient Hospital Stay: Payer: Managed Care, Other (non HMO) | Attending: Radiation Oncology | Admitting: Nutrition

## 2019-08-19 NOTE — Progress Notes (Signed)
Oncology Nurse Navigator Documentation  Confirmed patient's attendance at tomorrow afternoons H&N [place] at 2:45 at the [facility].  He understands to arrive at 8:00 for registration, that he will be directed to Radiation Waiting where he will be met prior to the start of [place].  Harlow Asa RN, BSN, OCN Head & Neck Oncology Nurse [place] at [facility] Phone # [phone]  Fax # [phone]

## 2019-08-19 NOTE — Progress Notes (Signed)
Nutrition follow up completed with patient who finished radiation on Jul 24, 2019 for base of tongue/tonsil cancer. Patient reports he still has taste alterations and may feel a slight improvement in taste. He thinks he is gaining weight slowly but his home scale is broken. It takes him a long time to eat food but he can consume ONS a little faster. He is drinking 5 Ensure Enlive daily and trying to drink a milkshake every day. He is eating small amounts of food. He plans on attending Head and Neck Clinic tomorrow.  Nutrition Diagnosis: Inadequate oral intake is improving.  Intervention: Continue ONS 5 times daily. Encouraged patient to add soft moist foods such as chicken salad, egg salad, fried eggs, etc.  Recommended he build in a variety of flavors to minimize taste fatigue. Provide 1 complimentary case of ensure enlive and coupons. Questions answered and teach back method used.  Monitoring, Evaluation, goals: Patient will tolerate increased calories and protein to promote healing and weight gain/stabilization.  Next Visit: Wednesday, June 30 by telephone.  **Disclaimer: This note was dictated with voice recognition software. Similar sounding words can inadvertently be transcribed and this note may contain transcription errors which may not have been corrected upon publication of note.**

## 2019-08-20 ENCOUNTER — Other Ambulatory Visit: Payer: Self-pay

## 2019-08-20 ENCOUNTER — Ambulatory Visit: Payer: Managed Care, Other (non HMO) | Attending: Radiation Oncology

## 2019-08-20 DIAGNOSIS — R1310 Dysphagia, unspecified: Secondary | ICD-10-CM | POA: Insufficient documentation

## 2019-08-20 NOTE — Therapy (Signed)
[facility] [address] [place], [place], [address] Phone: [phone]   Fax:  [phone]  Speech Language Pathology Treatment  Patient Details  Name: Albert Flowers MRN: [id number] Date of Birth: 1970/09/05 Referring Provider (SLP): [name], MD   Encounter Date: 08/20/2019  End of Session - 08/20/19 1537    Visit Number  3    Number of Visits  7    Date for SLP Re-Evaluation  12/18/19    SLP Start Time  [id number]    SLP Stop Time   1529    SLP Time Calculation (min)  40 min    Activity Tolerance  Patient tolerated treatment well       Past Medical History:  Diagnosis Date  . Diabetes mellitus without complication [facility])     Past Surgical History:  Procedure Laterality Date  . left neck dissection Left 05/04/2019   Left Neck Dissection by Dr. [name] at [facility]   . neck sugery     as a child "had a knot removed from neck" not sure which side.   . RIght neck dissection Right 06/16/2018   TORS and right neck dissection. Dr. [name] at [facility]    There were no vitals filed for this visit.  Subjective Assessment - 08/20/19 1451    Subjective  "My last day was May 7th."    Currently in Pain?  No/denies            ADULT SLP TREATMENT - 08/20/19 1452      General Information   Behavior/Cognition  Alert;Pleasant mood;Cooperative      Treatment Provided   Treatment provided  Dysphagia      Dysphagia Treatment   Temperature Spikes Noted  No    Respiratory Status  Room air    Oral Cavity - Dentition  Adequate natural dentition    Treatment Methods  Skilled observation;Therapeutic exercise;Patient/caregiver education    Type of PO's observed  Thin liquids;Dysphagia 3 (soft)    Feeding  Able to feed self    Liquids provided via  --   bottle   Oral Phase Signs & Symptoms  Prolonged mastication    Pharyngeal Phase Signs & Symptoms  Multiple swallows   immediate cough <10% solid boluses   Other  treatment/comments  Pt c/o dysgeusia and xerostomia today. Hard shell taco for lunch with tomato, beef, cheese with reduced pace and "wanted to make sure it's going down right." Pt told SLP rationale for food journal. Noncompliance re: frequency and scope of HEP, which was completed today with rare min A. SLP explained rationale for rare-occasional coughing today - SLP suspects due to edema near rad site which should subside ore and more over the next 4 weeks. SLP strongly encourged pt to adhere to scope and frequency of HEP.       Assessment / Recommendations / Plan   Plan  Continue with current plan of care       SLP Education - 08/20/19 1536    Education Details  HEP scope and frequency rtionale, reason likely for rare/occasional coughing during POs    Person(s) Educated  Patient    Methods  Explanation    Comprehension  Verbalized understanding       SLP Short Term Goals - 08/20/19 1538      SLP SHORT TERM GOAL #1   Title  Pt will complete HEP with rare min A over two sessions    Baseline  07-23-19, 08-20-19  Status  Achieved      SLP SHORT TERM GOAL #2   Title  Pt will tell SLP rationale for HEP completion    Status  Achieved      SLP SHORT TERM GOAL #3   Title  Pt will tell SLP 3 overt s/s aspiration PNA with modified independence    Status  Achieved       SLP Long Term Goals - 08/20/19 1539      SLP LONG TERM GOAL #1   Title  Pt will complete HEP with modified independence over 3 visits    Baseline  07-23-19    Time  2    Period  --   sessions, for all LTGs   Status  On-going      SLP LONG TERM GOAL #2   Title  Pt will tell SLP when to decr frequency of HEP    Time  3    Status  On-going       Plan - 08/20/19 1537    Clinical Impression Statement  Pt presents today with [facility] swallowing ability with dys III (ham sandwich) and water.  No overt s/sx aspiration PNA reported or observed today. Pt with rare/occasional coughing with POs today. Data suggests that as pts  progress through rad or chemorad therapy that their swallowing ability will decrease. Also, WNL swallowing is threatened by muscle fibrosis that will likely develop after rad/chemorad is completed. Therefore, SLP developed an indivdualized exercise program to mitigate/eliminate pt's muscle fibrosis following and as a result of, pt's rad tx. Skilled ST would cont to be beneficial to the pt in order to regularly assess pt's safety with POs and/or need for instrumental swallow assessment, as well as to assess accurate completion of swallowing HEP.    Speech Therapy Frequency  --   once approx every 4 weeks   Duration  --   6 follow up visits   Treatment/Interventions  Aspiration precaution training;Pharyngeal strengthening exercises;Diet toleration management by SLP;Trials of upgraded texture/liquids;Internal/external aids;Patient/family education;SLP instruction and feedback;Compensatory techniques    Potential to Achieve Goals  Good    SLP Home Exercise Plan  provided today    Consulted and Agree with Plan of Care  Patient       Patient will benefit from skilled therapeutic intervention in order to improve the following deficits and impairments:   Dysphagia, unspecified type    Problem List Patient Active Problem List   Diagnosis Date Noted  . Malignant neoplasm of tonsillar fossa ([place]) 06/01/2019  . Malignant neoplasm of base of tongue ([place]) 06/02/2018  . Head and neck cancer ([place]) 05/14/2018    [facility] ,[place], [place]  08/20/2019, 3:40 PM  [facility] [address] [place], [place], [address] Phone: [phone]   Fax:  [phone]   Name: Albert Flowers MRN: [id number] Date of Birth: June 11, 1970

## 2019-08-21 ENCOUNTER — Telehealth: Payer: Self-pay | Admitting: *Deleted

## 2019-08-21 NOTE — Telephone Encounter (Signed)
CALLED PATIENT TO ASK ABOUT COMING TO SEE DR. SQUIRE ON 11-13-19 FOR FU AND HIS TEST BEING ON 11-12-19, SPOKE WITH PATIENT AND HE  AGREED TO THESE DATES AND TIMES

## 2019-09-16 ENCOUNTER — Inpatient Hospital Stay: Payer: Managed Care, Other (non HMO) | Admitting: Nutrition

## 2019-09-16 NOTE — Progress Notes (Signed)
Nutrition follow-up completed with patient over the telephone. Patient completed radiation therapy for base of tongue/tonsil cancer on May 7. Last weight documented was 171.6 pounds on May 21, slightly improved. Patient reports he feels his taste is improving some however he is not able to tolerate condiments or spices. Reports Ensure is starting to taste bad but he has been adding chocolate and strawberry syrup. He has a poor appetite. Reports his tongue still burns with spicy food but his mouth is healed. Reports drinking 4-5 bottles of Ensure Enlive daily.  He is requesting coupons and samples.  Nutrition diagnosis: Inadequate oral intake improving.  Intervention: Encourage patient to continue strategies for adding different foods to his diet. Educated patient on strategies for improving taste alterations. Continue small frequent meals and snacks to improve appetite. Recommended patient document foods tolerated and foods not tolerated for reference in future. Will provide 1 complementary case of Ensure Enlive and coupons at patient's request.  Monitoring, evaluation, goals: Patient will tolerate increased calories and protein to promote healing and weight gain/stabilization.  Next visit: Wednesday, July 28 by telephone.  **Disclaimer: This note was dictated with voice recognition software. Similar sounding words can inadvertently be transcribed and this note may contain transcription errors which may not have been corrected upon publication of note.**

## 2019-09-17 ENCOUNTER — Other Ambulatory Visit: Payer: Self-pay

## 2019-09-17 ENCOUNTER — Ambulatory Visit: Payer: Managed Care, Other (non HMO) | Attending: Radiation Oncology

## 2019-09-17 DIAGNOSIS — R1310 Dysphagia, unspecified: Secondary | ICD-10-CM | POA: Insufficient documentation

## 2019-09-17 NOTE — Therapy (Signed)
[facility] [address] [place], [place], [address] Phone: [phone]   Fax:  [phone]  Speech Language Pathology Treatment  Patient Details  Name: Albert Flowers MRN: [id number] Date of Birth: 1970-09-18 Referring Provider (SLP): [name], MD   Encounter Date: 09/17/2019   End of Session - 09/17/19 1511    Visit Number 4    Number of Visits 7    Date for SLP Re-Evaluation 12/18/19    SLP Start Time 6237    SLP Stop Time  1445    SLP Time Calculation (min) 41 min    Activity Tolerance Patient tolerated treatment well           Past Medical History:  Diagnosis Date  . Diabetes mellitus without complication [facility])     Past Surgical History:  Procedure Laterality Date  . left neck dissection Left 05/04/2019   Left Neck Dissection by Dr. [name] at [facility].   . neck sugery     as a child "had a knot removed from neck" not sure which side.   . RIght neck dissection Right 06/16/2018   TORS and right neck dissection. Dr. [name] at [facility]    There were no vitals filed for this visit.   Subjective Assessment - 09/17/19 1405    Subjective "I thnk I finally started to get my tastebuds back yesterday."    Currently in Pain? No/denies                 ADULT SLP TREATMENT - 09/17/19 1411      General Information   Behavior/Cognition Alert;Pleasant mood;Cooperative      Treatment Provided   Treatment provided Dysphagia      Dysphagia Treatment   Temperature Spikes Noted No    Respiratory Status Room air    Oral Cavity - Dentition Adequate natural dentition    Treatment Methods Skilled observation;Therapeutic exercise;Patient/caregiver education    Patient observed directly with PO's Yes    Type of PO's observed Dysphagia 3 (soft);Thin liquids    Feeding Able to feed self    Oral Phase Signs & Symptoms Prolonged mastication    Pharyngeal Phase Signs & Symptoms --   none noted- pt tried not  coughing and successful-? habit?   Other treatment/comments Pt entered room with subtle coughing. No overt s/sx aspiration PNA and denied these as well. Pt told SLP he feels like he has to cough/throat clear all day. Pt eating milkshakes, french fries for the first time last week, grits this morning, hamburger ([employer]-without the bun). He still makes sure to take smaller bite sizes as he feels the need to take a smaller bite unless he will '"choke". SLP to monitor this.  Pt was modified indpendent on the HEP this session but reports suboptimal compliance with frequency again this session, but improved over previous session pt stated. SLP reiterated BID completion and suggested some times - in the car, watching TV.      Assessment / Recommendations / Plan   Plan Continue with current plan of care   if pt mod I with HEP and safe with PO decr to once/8 weeks     Progression Toward Goals   Progression toward goals Progressing toward goals              SLP Short Term Goals - 09/17/19 1501      SLP SHORT TERM GOAL #1   Title Pt will complete HEP with rare min A over two  sessions    Baseline 07-23-19, 08-20-19    Status Achieved      SLP SHORT TERM GOAL #2   Title Pt will tell SLP rationale for HEP completion    Status Achieved      SLP SHORT TERM GOAL #3   Title Pt will tell SLP 3 overt s/s aspiration PNA with modified independence    Status Achieved            SLP Long Term Goals - 09/17/19 1500      SLP LONG TERM GOAL #1   Title Pt will complete HEP with modified independence over 3 visits    Baseline 07-23-19, 09-17-19    Time 1    Period --   sessions, for all LTGs   Status On-going      SLP LONG TERM GOAL #2   Title Pt will tell SLP when to decr frequency of HEP    Time 2    Status On-going            Plan - 09/17/19 1511    Clinical Impression Statement Pt presents today with [facility] swallowing ability with dys III ([place] sandwich) and water.  No overt s/sx aspiration  PNA reported or observed today. Pt with occasional coughing throughout session including with POs. Data suggests that as pts progress through rad or chemorad therapy that their swallowing ability will decrease. Also, WNL swallowing is threatened by muscle fibrosis that will likely develop after rad/chemorad is completed. Therefore, SLP developed an indivdualized exercise program to mitigate/eliminate pt's muscle fibrosis following and as a result of, pt's rad tx. Skilled ST would cont to be beneficial to the pt in order to regularly assess pt's safety with POs and/or need for instrumental swallow assessment, as well as to assess accurate completion of swallowing HEP.    Speech Therapy Frequency --   once approx every 4 weeks   Duration --   6 follow up visits   Treatment/Interventions Aspiration precaution training;Pharyngeal strengthening exercises;Diet toleration management by SLP;Trials of upgraded texture/liquids;Internal/external aids;Patient/family education;SLP instruction and feedback;Compensatory techniques    Potential to Achieve Goals Good    SLP Home Exercise Plan provided today    Consulted and Agree with Plan of Care Patient           Patient will benefit from skilled therapeutic intervention in order to improve the following deficits and impairments:   Dysphagia, unspecified type    Problem List Patient Active Problem List   Diagnosis Date Noted  . Malignant neoplasm of tonsillar fossa ([place]) 06/01/2019  . Malignant neoplasm of base of tongue ([place]) 06/02/2018  . Head and neck cancer ([place]) 05/14/2018    [facility] ,[place], [place]  09/17/2019, 3:14 PM  [facility] [address] [place], [place], [address] Phone: [phone]   Fax:  [phone]   Name: Albert Flowers MRN: [id number] Date of Birth: 1970/11/01

## 2019-09-17 NOTE — Progress Notes (Signed)
Oncology Nurse Navigator Documentation  I met with patient during his [place] follow up appointment with Garald Balding SLP today. I provided him with one case of osmolite and coupons as requested by Dory Peru RD. He is feeling well, denies concerns today. He does know how to call me if he has questions or concerns.   Harlow Asa RN, BSN, OCN Head & Neck Oncology Nurse [place] at [facility] Phone # [phone]  Fax # [phone]

## 2019-09-22 NOTE — Progress Notes (Signed)
  Patient Name: Albert Flowers MRN: [id number] DOB: 1971/02/05 Referring Physician: [name] (Profile Not Attached) Date of Service: 07/24/2019 [facility], [place]                                                        End Of Treatment Note  Diagnoses: C09.0-Malignant neoplasm of tonsillar fossa  Cancer Staging: Cancer Staging Malignant neoplasm of base of tongue ([place]) Staging form: Pharynx - HPV-Mediated Oropharynx, AJCC 8th Edition - Clinical: Stage I (cT2, cN1, cM0, p16+) - Signed by [name], MD on 06/02/2018 - Pathologic stage from 05/29/2019: Stage I (rpT0, pN1, cM0, p16+) - Signed by [name], MD on 05/29/2019  Intent: Curative  Radiation Treatment Dates: 06/15/2019 through 07/24/2019   Site Technique Total Dose (Gy) Dose per Fx (Gy) Completed Fx Beam Energies  Tonsil, Right: HN_Rt_tonsil IMRT 60/60 2 30/30 6X   Narrative: The patient tolerated radiation therapy to the right tonsillar bed and bilateral neck relatively well.   Plan: The patient will follow-up with radiation oncology in 2 wks. -----------------------------------  [name], MD

## 2019-10-14 ENCOUNTER — Inpatient Hospital Stay: Payer: Managed Care, Other (non HMO) | Attending: Radiation Oncology | Admitting: Nutrition

## 2019-10-14 ENCOUNTER — Telehealth: Payer: Self-pay | Admitting: Nutrition

## 2019-10-14 NOTE — Telephone Encounter (Signed)
I contacted patient by telephone for nutrition follow-up.   Patient reports he continues to have a poor appetite and taste alterations.   States some foods continue to burn the inside of his mouth. Reports weight is in the 160s but he does not think he is losing any weight.   He is drinking approximately 3 Ensure Enlive every day.  Nutrition diagnosis: Inadequate oral intake continues.  Intervention: Continue Ensure Enlive 3 times daily. Increase variety of foods at least 3 times daily. Recommend baking soda and salt water rinses frequently throughout the day. We will provide an additional case of Ensure Enlive for patient to pick up on Wednesday, August 4 at his request. Provided support and encouragement.  Monitoring, evaluation, goals: Patient will tolerate increased calories and protein for minimal weight loss.  Next visit: To be scheduled as needed.  **Disclaimer: This note was dictated with voice recognition software. Similar sounding words can inadvertently be transcribed and this note may contain transcription errors which may not have been corrected upon publication of note.**

## 2019-10-19 ENCOUNTER — Ambulatory Visit: Payer: Managed Care, Other (non HMO) | Admitting: Nutrition

## 2019-10-19 NOTE — Progress Notes (Signed)
Received call from patient with questions regarding thickened saliva.  Patient reports he feels his taste has improved some.  He still has thick saliva and tried both pineapple juice and papaya nectar but did not like either.  I provided additional strategies including baking soda and salt water gargle for thick saliva.  Patient can try these juices thinned down with water to see if he tolerates the taste.  He has my number for further questions.  **Disclaimer: This note was dictated with voice recognition software. Similar sounding words can inadvertently be transcribed and this note may contain transcription errors which may not have been corrected upon publication of note.**

## 2019-11-03 ENCOUNTER — Ambulatory Visit: Payer: Self-pay | Admitting: Radiation Oncology

## 2019-11-05 ENCOUNTER — Other Ambulatory Visit: Payer: Self-pay

## 2019-11-05 ENCOUNTER — Ambulatory Visit: Payer: Managed Care, Other (non HMO) | Attending: Radiation Oncology

## 2019-11-05 VITALS — Wt 150.8 lb

## 2019-11-05 DIAGNOSIS — R1310 Dysphagia, unspecified: Secondary | ICD-10-CM | POA: Diagnosis not present

## 2019-11-07 NOTE — Therapy (Signed)
[facility] [address] [place], [place], [address] Phone: [phone]   Fax:  [phone]  Speech Language Pathology Treatment  Patient Details  Name: Albert Flowers MRN: [id number] Date of Birth: 1971-02-12 Referring Provider (SLP): [name], MD   Encounter Date: 11/05/2019   End of Session - 11/07/19 0011    Visit Number 5    Number of Visits 7    Date for SLP Re-Evaluation 12/18/19    SLP Start Time 7048    SLP Stop Time  8891    SLP Time Calculation (min) 39 min    Activity Tolerance Patient tolerated treatment well           Past Medical History:  Diagnosis Date  . Diabetes mellitus without complication [facility])     Past Surgical History:  Procedure Laterality Date  . left neck dissection Left 05/04/2019   Left Neck Dissection by Dr. [name] at [facility].   . neck sugery     as a child "had a knot removed from neck" not sure which side.   . RIght neck dissection Right 06/16/2018   TORS and right neck dissection. Dr. [name] at [place]:   11/05/19 1500  Weight: 150 lb 12.8 oz (68.4 kg)      Subjective Assessment - 11/07/19 0011    Subjective "I am not having trouble eating but the desire to eat isn't there."    Currently in Pain? No/denies          General Information 11-06-19    Behavior/Cognition Alert;Pleasant mood;Cooperative        Treatment Provided   Treatment provided Dysphagia        Dysphagia Treatment   Temperature Spikes Noted No    Respiratory Status Room air    Oral Cavity - Dentition Adequate natural dentition    Treatment Methods Skilled observation;Therapeutic exercise;Patient/caregiver education    Patient observed directly with PO's Yes    Type of PO's observed Dysphagia 3 (soft);Thin liquids    Feeding Able to feed self    Oral Phase Signs & Symptoms Prolonged mastication    Pharyngeal Phase Signs & Symptoms --   consistent throat clear with  liquids   Other treatment/comments Pt with noted scar tissue on lt lateral neck likely due to neck dissection. However he also appeared mildly fibrotic just anterior to ramus of mandible on lt as well. Difference between today and previous session is consistent throat clear with liquids - more frequently than previous session. SLP to recommend MBSS to referring MD. Pt was explained rationale for SLP recommending MBSS to MD and agreed. No overt s/sx aspiration PNA observed by SLP, and pt denied these s/sx as well. Pt eating more solid food and wider variety of solids than previous session. "I just decided I had to stop drinking those protein shakes." HEP completed with modified indpendence as last session, but pt cont to report suboptimal compliance but more regular completion than last session. SLP reiterated BID completion.       Assessment / Recommendations / Plan   Plan Continue with current plan of care   SLP to recommend MBSS due to consistent throat clear with thin liquids.       Progression Toward Goals   Progression toward goals Progressing toward goals                SLP Short Term Goals - 09/17/19 1501      SLP SHORT TERM GOAL #  1   Title Pt will complete HEP with rare min A over two sessions    Baseline 07-23-19, 08-20-19    Status Achieved      SLP SHORT TERM GOAL #2   Title Pt will tell SLP rationale for HEP completion    Status Achieved      SLP SHORT TERM GOAL #3   Title Pt will tell SLP 3 overt s/s aspiration PNA with modified independence    Status Achieved            SLP Long Term Goals - 09/17/19 1500      SLP LONG TERM GOAL #1   Title Pt will complete HEP with modified independence over 3 visits    Baseline 07-23-19, 09-17-19    Time    Period --   sessions, for all LTGs   Status Acheived      SLP LONG TERM GOAL #2   Title Pt will tell SLP when to decr frequency of HEP    Time 1   Status On-going            Plan - 11/07/19 0012    Clinical  Impression Statement Pt presents today with [facility] swallowing ability with dys III ([place] sandwich) and water. No overt s/sx aspiration PNA reported or observed today. Pt with persistent coughing with liquids - SLP to recommend MBSS. Data suggests that as pts progress through rad or chemorad therapy that their swallowing ability will decrease. Also, WNL swallowing is threatened by muscle fibrosis that will likely develop after rad/chemorad is completed. Therefore, SLP developed an indivdualized exercise program to mitigate/eliminate pt's muscle fibrosis following and as a result of, pt's rad tx. Skilled ST would cont to be beneficial to the pt in order to regularly assess pt's safety with POs and/or need for instrumental swallow assessment, as well as to assess accurate completion of swallowing HEP.    Speech Therapy Frequency --   once approx every 4 weeks   Duration --   6 follow up visits   Treatment/Interventions Aspiration precaution training;Pharyngeal strengthening exercises;Diet toleration management by SLP;Trials of upgraded texture/liquids;Internal/external aids;Patient/family education;SLP instruction and feedback;Compensatory techniques    Potential to Achieve Goals Good    SLP Home Exercise Plan provided today    Consulted and Agree with Plan of Care Patient           Patient will benefit from skilled therapeutic intervention in order to improve the following deficits and impairments:   Dysphagia, unspecified type    Problem List Patient Active Problem List   Diagnosis Date Noted  . Malignant neoplasm of tonsillar fossa ([place]) 06/01/2019  . Malignant neoplasm of base of tongue ([place]) 06/02/2018  . Head and neck cancer ([place]) 05/14/2018    [facility] ,[place], [place]  11/07/2019, 12:13 AM  [facility] [address] [place], [place], [address] Phone: [phone]   Fax:  [phone]   Name: Albert Flowers MRN:  [id number] Date of Birth: 04/28/1970

## 2019-11-09 ENCOUNTER — Ambulatory Visit (HOSPITAL_COMMUNITY): Payer: Managed Care, Other (non HMO)

## 2019-11-09 ENCOUNTER — Other Ambulatory Visit: Payer: Self-pay

## 2019-11-09 DIAGNOSIS — C01 Malignant neoplasm of base of tongue: Secondary | ICD-10-CM

## 2019-11-10 ENCOUNTER — Other Ambulatory Visit: Payer: Self-pay

## 2019-11-11 ENCOUNTER — Telehealth: Payer: Self-pay | Admitting: *Deleted

## 2019-11-11 NOTE — Telephone Encounter (Signed)
CALLED PATIENT TO ASK ABOUT COMING IN FOR STAT LABS ON 11-12-19, SPOKE WITH PATIENT AND HE AGREED TO COME IN @ 11:45 AM ON 11-12-19 FOR STAT LABS @ [facility]

## 2019-11-12 ENCOUNTER — Other Ambulatory Visit: Payer: Self-pay

## 2019-11-12 ENCOUNTER — Ambulatory Visit
Admission: RE | Admit: 2019-11-12 | Discharge: 2019-11-12 | Disposition: A | Discharge status: Home / Self Care | Payer: Managed Care, Other (non HMO) | Source: Ambulatory Visit | Attending: Radiation Oncology | Admitting: Radiation Oncology

## 2019-11-12 ENCOUNTER — Ambulatory Visit (HOSPITAL_COMMUNITY)
Admission: RE | Admit: 2019-11-12 | Discharge: 2019-11-12 | Disposition: A | Discharge status: Home / Self Care | Payer: Managed Care, Other (non HMO) | Source: Ambulatory Visit | Attending: Radiation Oncology | Admitting: Radiation Oncology

## 2019-11-12 DIAGNOSIS — C090 Malignant neoplasm of tonsillar fossa: Secondary | ICD-10-CM

## 2019-11-12 DIAGNOSIS — Z510 Encounter for antineoplastic radiation therapy: Secondary | ICD-10-CM | POA: Insufficient documentation

## 2019-11-12 LAB — BUN & CREATININE (CHCC)
BUN: 9 mg/dL (ref 6–20)
Creatinine: 0.84 mg/dL (ref 0.61–1.24)
GFR, Est AFR Am: >60 mL/min (ref >60)
GFR, Estimated: >60 mL/min (ref >60)

## 2019-11-12 IMAGING — CT CT NECK W/ CM
4 of 5 series · 14 of 33 positions shown, 16 images · IV contrast (omnipaque)
Comparison: [REDACTED] [HOSPITAL] [HOSPITAL]
neck CT [DATE], [DATE]. PET-CT [DATE]. Chest CT today
reported separately.

CLINICAL DATA: 49-year-old male with history of treated tonsillar
carcinoma. Restaging.

EXAM:
CT NECK WITH CONTRAST
TECHNIQUE: Multidetector CT imaging of the neck was performed using the
standard protocol following the bolus administration of intravenous
contrast.
CONTRAST:  75mL OMNIPAQUE IOHEXOL 300 MG/ML  SOLN

[Series 1: axial neck · axial · 0.54mm/px · z∈[-223,-165]mm · 2 of 118 slices shown]
[im 30/118  bone]
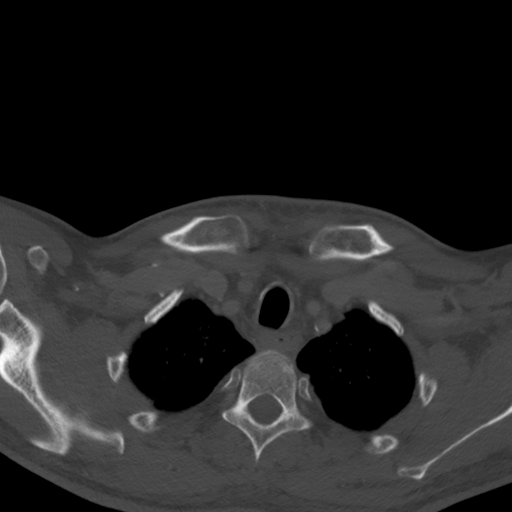
[im 59/118  bone]
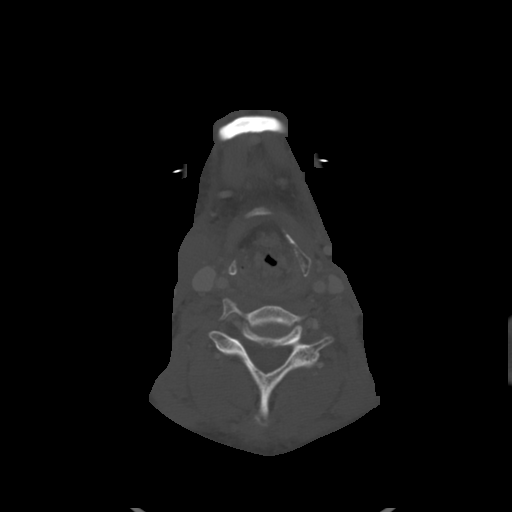

[Series 5: orthogonal ax · axial · 0.53mm/px · z∈[-281,-148]mm · 4 of 122 slices shown, 5 images]
[im 25/122  soft-tissue]
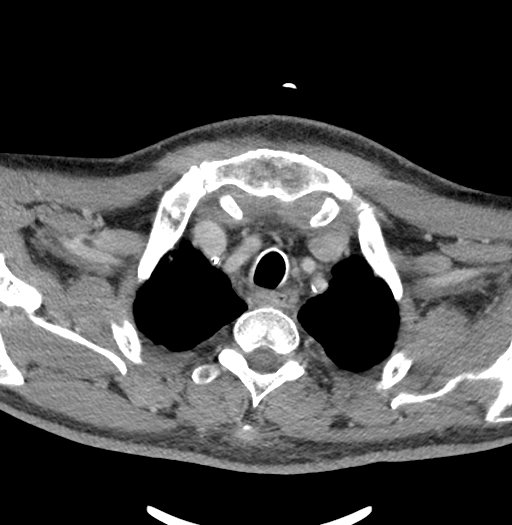
[im 25/122  bone]
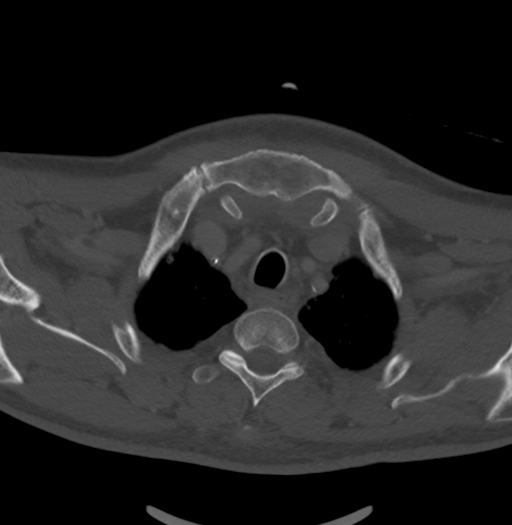
[im 49/122  bone]
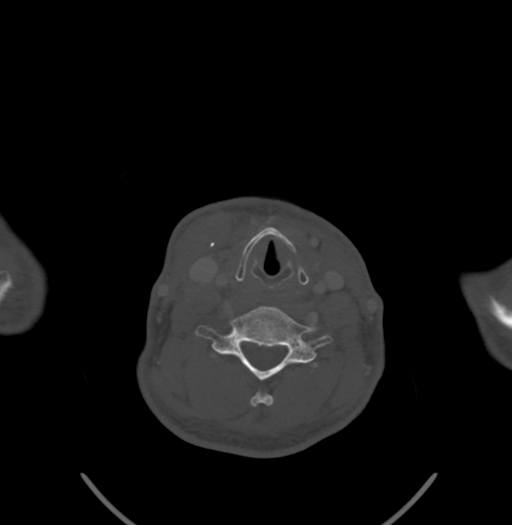
[im 73/122  bone]
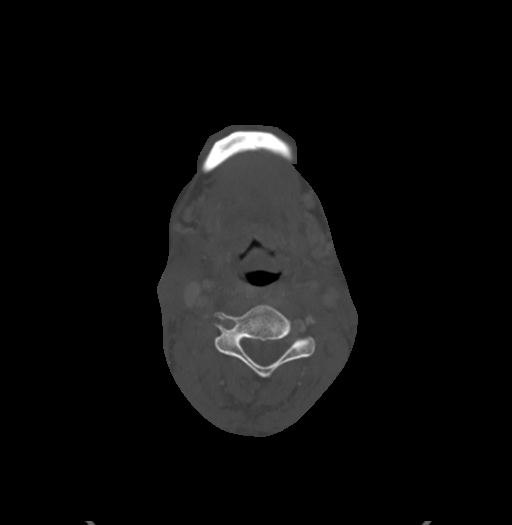
[im 97/122  bone]
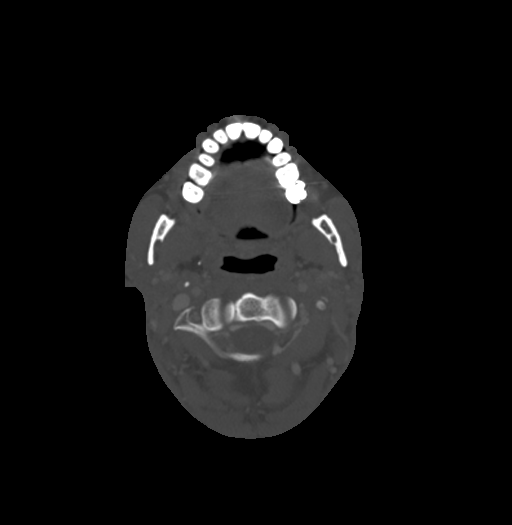

[Series 6: cor neck · coronal · 0.51mm/px · 3 of 101 slices shown]
[im 33/101  bone]
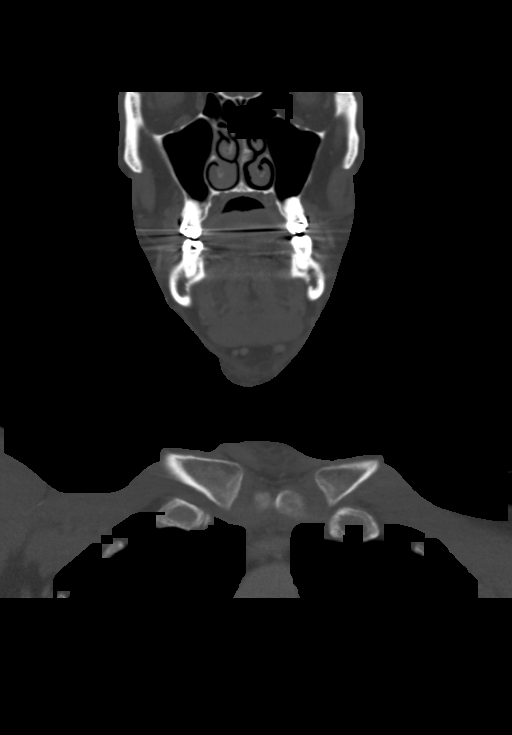
[im 45/101  bone]
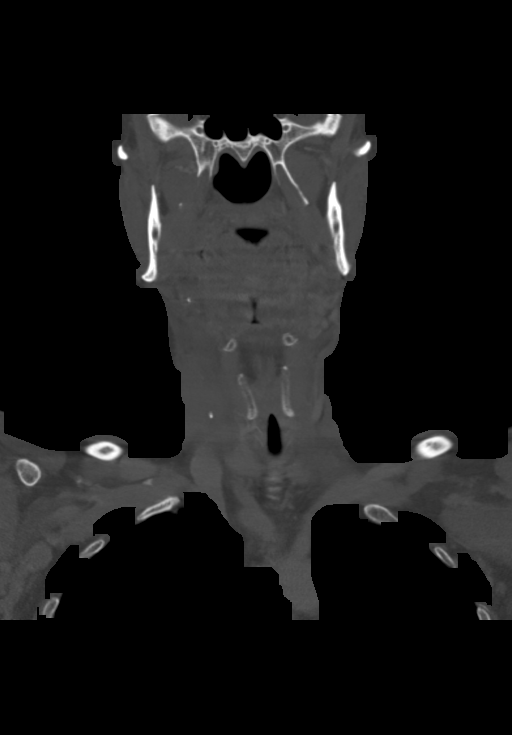
[im 56/101  bone]
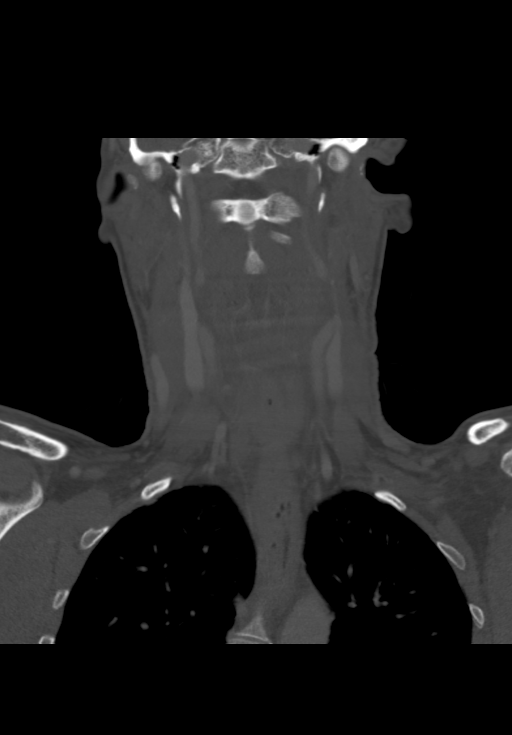

[Series 7: sag neck · sagittal · 0.51mm/px · 5 of 79 slices shown, 6 images]
[im 27/79  bone]
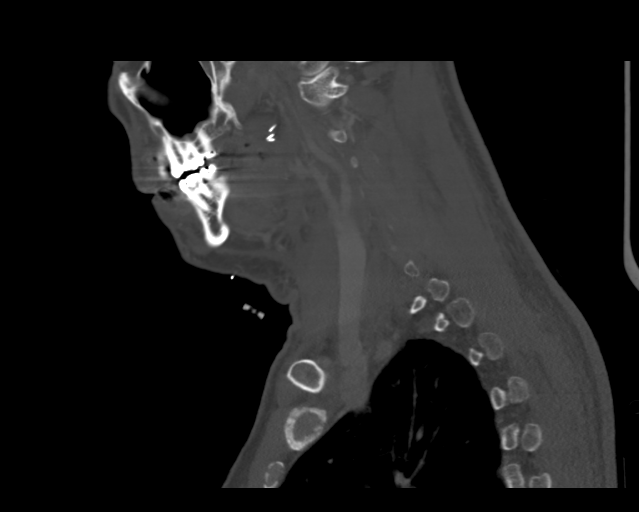
[im 33/79  bone]
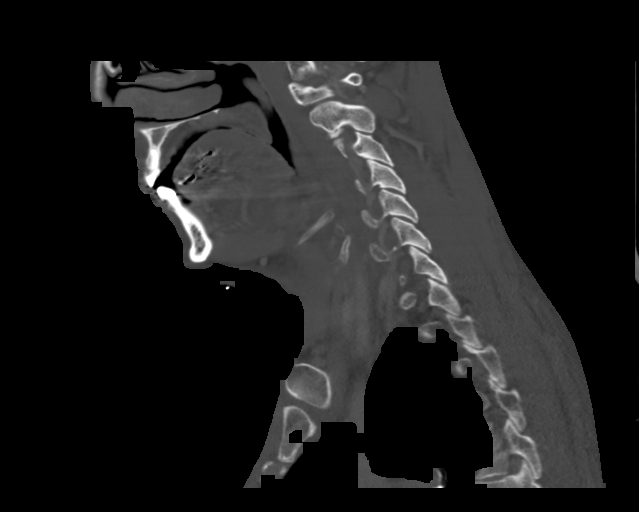
[im 40/79  soft-tissue]
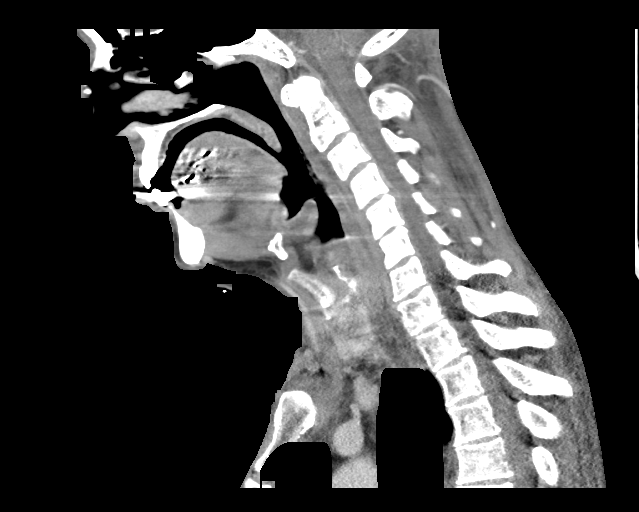
[im 40/79  bone]
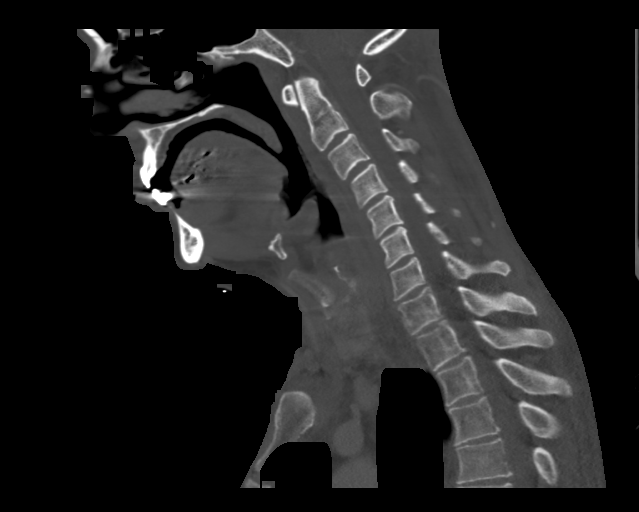
[im 46/79  bone]
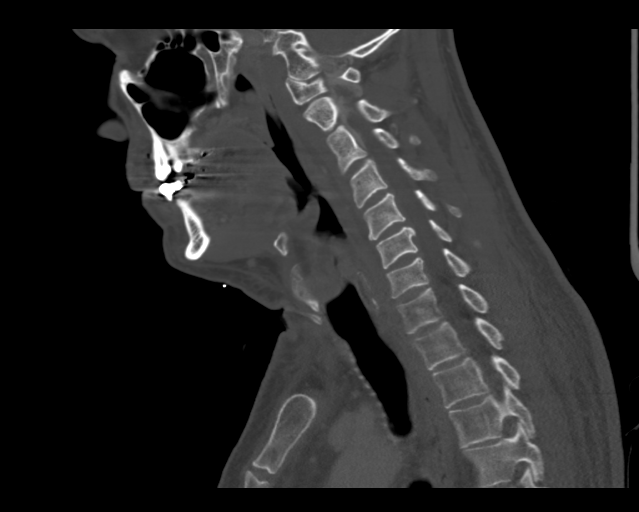
[im 53/79  bone]
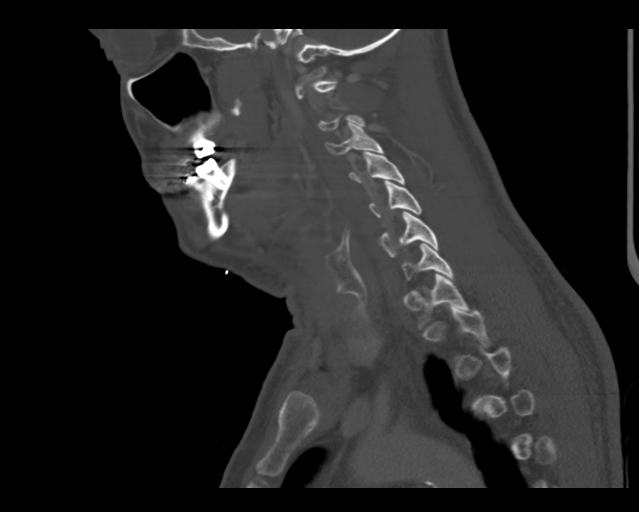

[14 of 33 positions shown; findings below may reference images not displayed]

FINDINGS: Pharynx and larynx: Decreased left tonsillar soft tissue since
[REDACTED], with the pharyngeal soft tissue contours now fairly
symmetric and within normal limits.

Mild diffuse pharyngeal space mucosal thickening, and moderate
thickening of the epiglottis compatible with interval radiation.
Associated small retropharyngeal effusion. Small volume retained
secretions in the pharynx.

The superior parapharyngeal spaces are normal aside from chronic
surgical clips on the right.

Salivary glands: Negative sublingual space. Chronic postoperative
changes to the right submandibular space with absence of the
submandibular gland, and superimposed bilateral radiation changes
with interval atrophy and hyperenhancement of the left submandibular
gland. Similar but less pronounced post radiation changes to the
parotid glands.

Thyroid: Negative.

Lymph nodes: Surgical clips now in the left neck at the level 2
nodal stations with no residual of the malignant appearing level 2 a
lymph node in [REDACTED]. A small node at the junctions of left level
IIb and IIIb mildly enhances and is 5 mm short axis, 1 mm smaller
since [REDACTED].

Sequelae of right side nodal dissection also appears stable.

Vascular: The superior left IJ is now highly diminutive but does
remain patent along with the left sigmoid sinus. Other major
vascular structures in the neck and at the skull base are patent.

Limited intracranial: Negative.

Visualized orbits: Negative.

Mastoids and visualized paranasal sinuses: Clear.

Skeleton: No acute or suspicious osseous lesion.

Upper chest: Reported separately today.
IMPRESSION: 1. KLEVER category 1. Postoperative and post radiation changes to
the neck with no residual tumor or lymphadenopathy.
2. CT Chest today reported separately.

## 2019-11-12 IMAGING — CT CT CHEST W/ CM
2 of 4 series · 15 of 36 positions shown, 18 images · IV contrast (omnipaque)
Comparison: Neck CTs both from within system an outside of system.

CLINICAL DATA: Head neck cancer, assess treatment response

EXAM:
CT CHEST WITH CONTRAST
TECHNIQUE: Multidetector CT imaging of the chest was performed during
intravenous contrast administration.
CONTRAST:  75mL OMNIPAQUE IOHEXOL 300 MG/ML  SOLN

[Series 4: coronal · coronal · 0.75mm/px · 3 of 128 slices shown]
[im 26/128  lung]
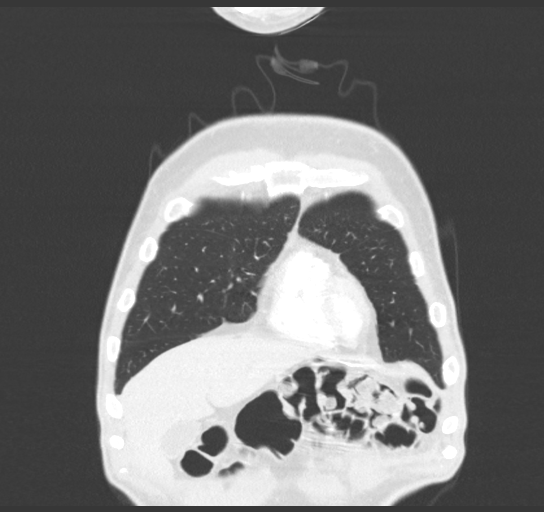
[im 51/128  lung]
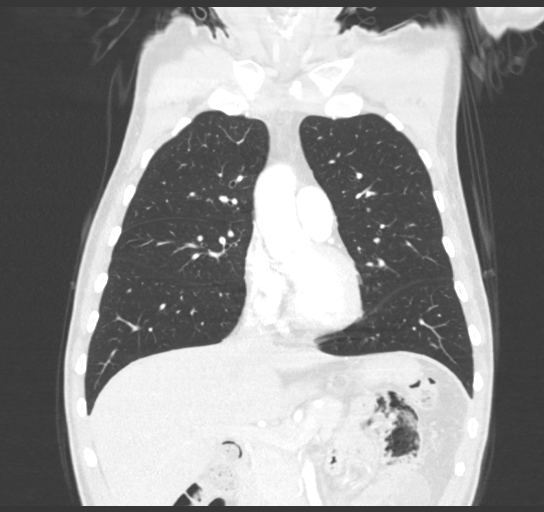
[im 77/128  lung]
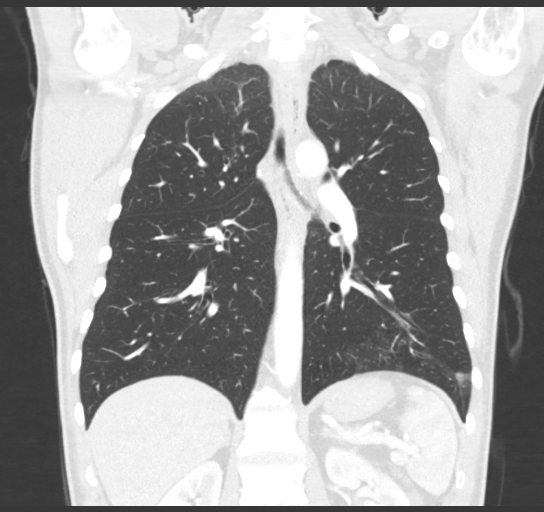

[Series 6: lungs · axial · 0.75mm/px · z∈[-517,-181]mm · 12 of 188 slices shown, 15 images]
[im 10/188  mediastinal]
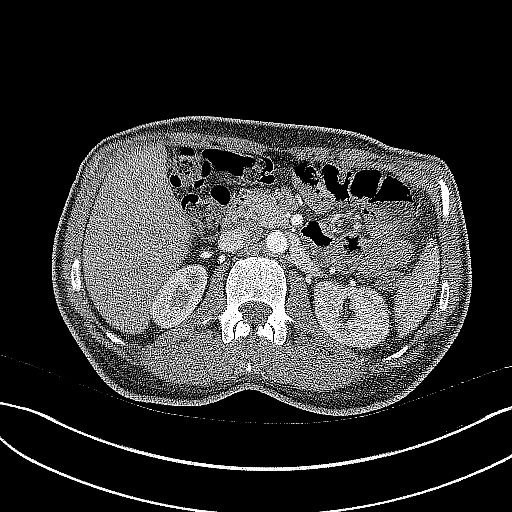
[im 10/188  lung]
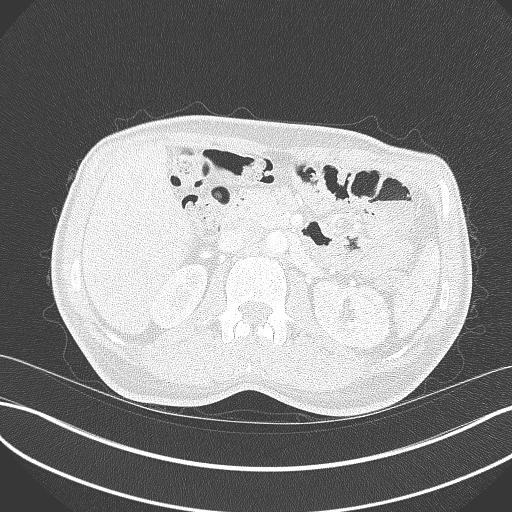
[im 30/188  lung]
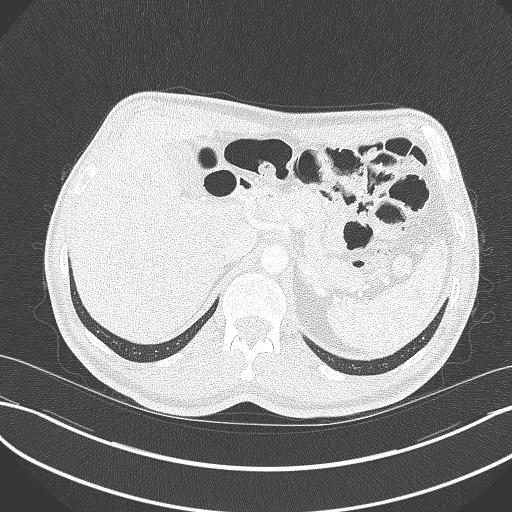
[im 40/188  lung]
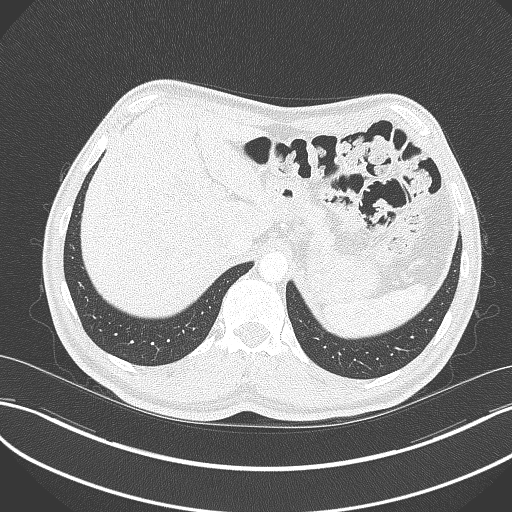
[im 60/188  lung]
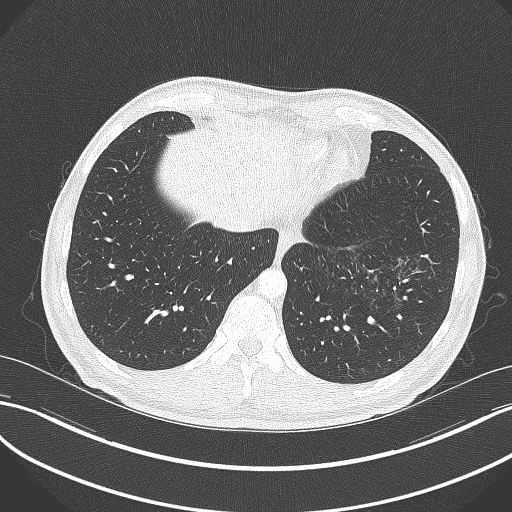
[im 69/188  mediastinal]
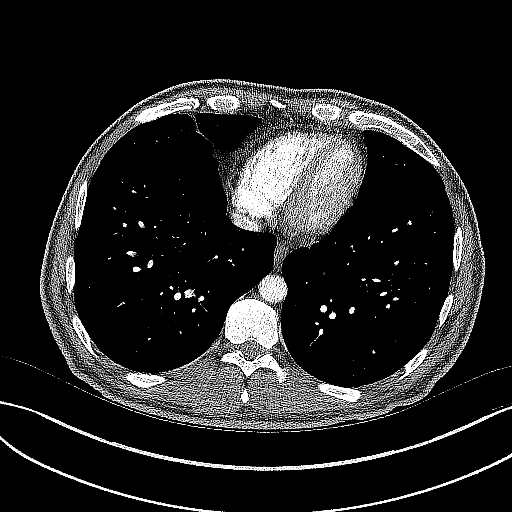
[im 69/188  lung]
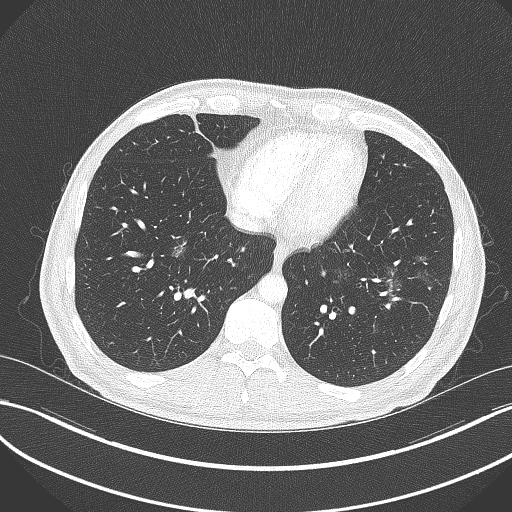
[im 89/188  lung]
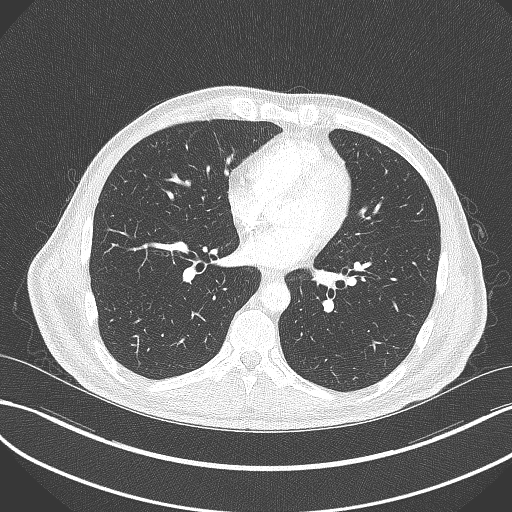
[im 99/188  lung]
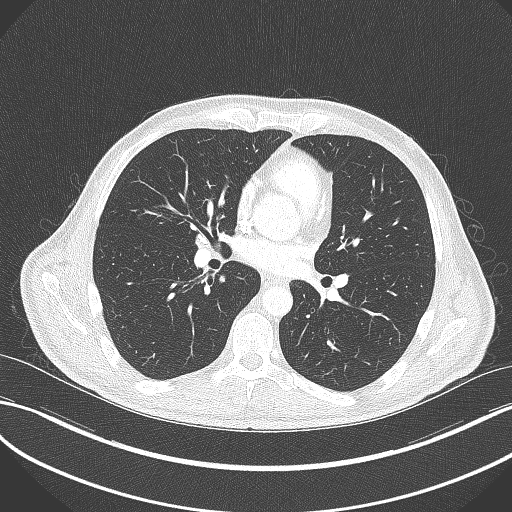
[im 119/188  lung]
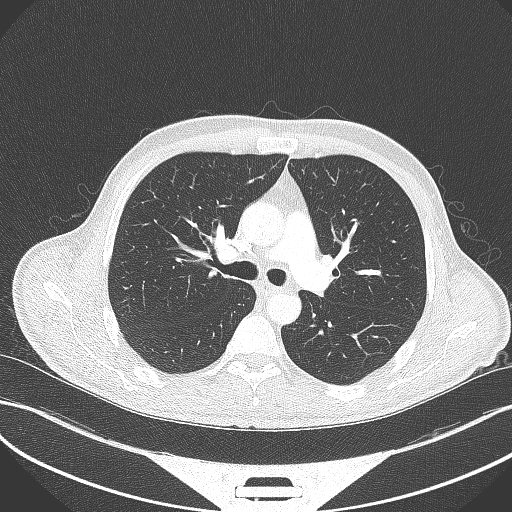
[im 128/188  mediastinal]
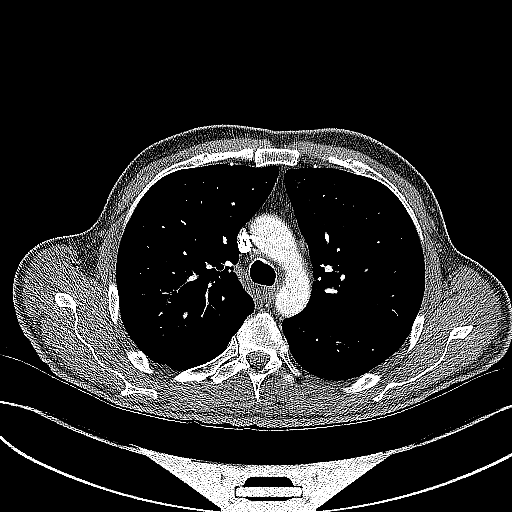
[im 128/188  lung]
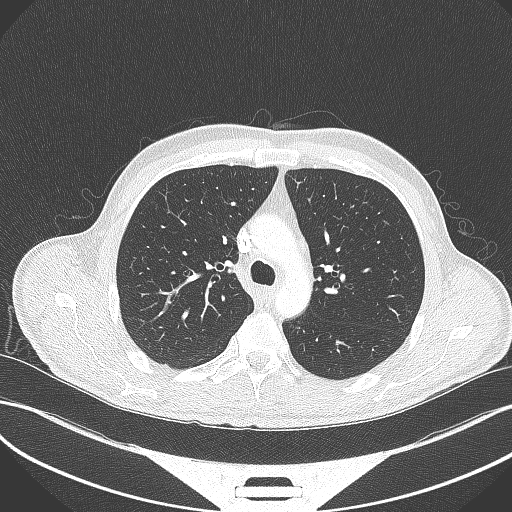
[im 148/188  lung]
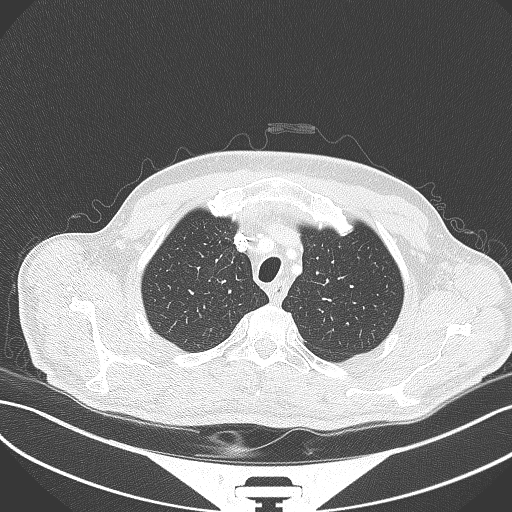
[im 158/188  lung]
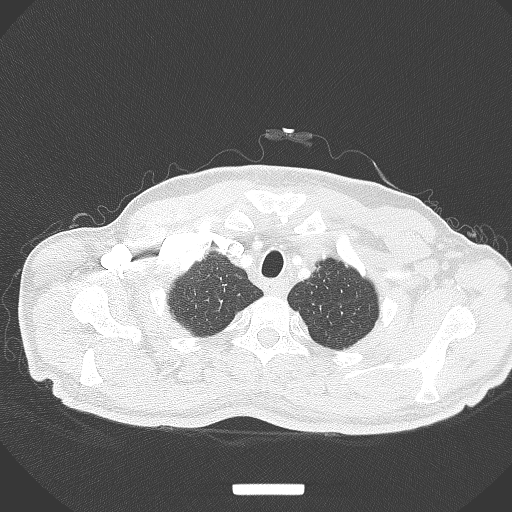
[im 178/188  lung]
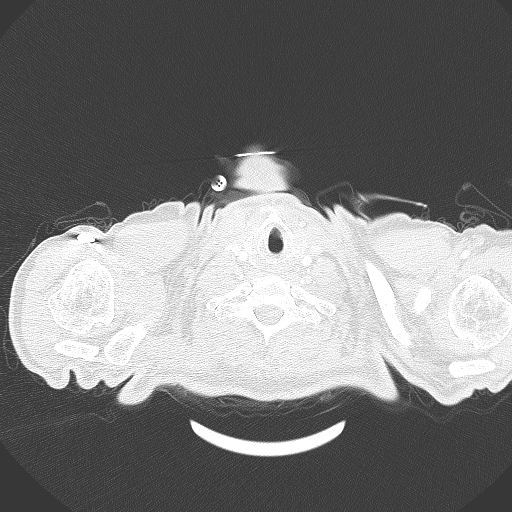

[15 of 36 positions shown; findings below may reference images not displayed]

FINDINGS: Cardiovascular: Thoracic aorta is normal caliber. Heart size is
normal without pericardial effusion.

Central pulmonary vasculature is unremarkable.

Mediastinum/Nodes: No mediastinal lymphadenopathy. No hilar
lymphadenopathy. No axillary lymphadenopathy.

Stranding in the low neck better demonstrated on the neck CT
acquired on the same date compatible with post treatment changes,
incompletely imaged. Esophagus grossly normal.

Lungs/Pleura: Patchy ground-glass opacities in the LEFT lung base
with some tree-in-bud nodularity. No discrete, suspicious pulmonary
nodule.

Ground-glass changes also in the RIGHT lung base to a lesser extent
with a small area measuring approximately 1.5 by 1 cm. Patchy areas
with similar appearance present in the LEFT chest as described. The
airways are patent. No current bronchial wall thickening. Calcified
granulomatous changes in the RIGHT hilum and RIGHT middle lobe.

Upper Abdomen: Incidental imaging of upper abdominal contents
without acute process. The adrenal glands are normal.

Musculoskeletal: No acute bone finding. No destructive bone process.
IMPRESSION: 1. Patchy ground-glass predominantly at the LEFT lung base, mild
bronchial wall thickening in areas subtending affected areas of lung
with ground-glass nodule in the RIGHT chest without bronchial wall
thickening. Findings may be indicative of pneumonitis, perhaps from
aspiration in this patient with head and neck cancer. Suggest short
interval follow-up to ensure resolution, within 8-12 weeks.
2. Stranding in the low neck better demonstrated on the neck CT
acquired on the same date compatible with post treatment changes,
incompletely imaged.
3. Calcified granulomatous changes in the RIGHT hilum and RIGHT
middle lobe.
4. Aortic atherosclerosis.

Aortic Atherosclerosis ([0L]-[0L]).

## 2019-11-12 MED ORDER — SODIUM CHLORIDE (PF) 0.9 % IJ SOLN
INTRAMUSCULAR | Status: AC
  Filled 2019-11-12: qty 50

## 2019-11-12 MED ORDER — IOHEXOL 300 MG/ML  SOLN
75.0000 mL | Freq: Once | INTRAMUSCULAR | Status: AC | PRN
  Administered 2019-11-12: 75 mL via INTRAVENOUS

## 2019-11-12 NOTE — Progress Notes (Signed)
Mr. Altamura presents today for follow-up after completing radiation to the right tonsil on 07/24/2019 and to review CT scan results from 11/12/2019  Pain issues, if any: Patient denies Using a feeding tube?: N/A Weight changes, if any: Wt Readings from Last 3 Encounters:  11/13/19 150 lb (68 kg)  11/05/19 150 lb 12.8 oz (68.4 kg)  08/07/19 171 lb 9.6 oz (77.8 kg)   Swallowing issues, if any: Patient reports mild improvement but states the SLP recommended a MBSS to see if he's having any muscular issues still preventing him from swallowing normally. He is slowly starting to eat more soft/solid foods.  11/05/2019 Saw Carl Schinke-SLP "Pt with noted scar tissue on lt lateral neck likely due to neck dissection. However he also appeared mildly fibrotic just anterior to ramus of mandible on lt as well. Difference between today and previous session is consistent throat clear with liquids - more frequently than previous session. SLP to recommend MBSS to referring MD. Pt was explained rationale for SLP recommending MBSS to MD and agreed. No overt s/sx aspiration PNA observed by SLP, and pt denied these s/sx as well. Pt eating more solid food and wider variety of solids than previous session. "I just decided I had to stop drinking those protein shakes." HEP completed with modified indpendence as last session, but pt cont to report suboptimal compliance but more regular completion than last session. SLP reiterated BID completion." Smoking or chewing tobacco? None Using fluoride trays daily? Not yet, but hoping to when his mouth isn't so sensitive  Last ENT visit was on: 09/29/2019 Saw Dr. [name]: "Quality of voice: no hoarseness, no dysarthria  . Oral cavity: tongue mobile, FOM soft, mucosa healthy throughout with no masses, lesions, ulcerations  . Oropharynx: right tonsillar fossa with firm scar tissue and a small area of ulceration superiorly (likely soft tissue necrosis) , surrounding mucosa  otherwise with no mucosal ulcerations or masses or other worrisome lesions; left tonsillar fossa well-healed . Mirror Examination: normal, TVCs mobile . Neck - no palpable neck masses or enlarged lymph nodes in either side  . Thyroid: normal size, nontender, no nodules palpable  There is no evidence of disease on exam today.  He sees Dr Isidore Moos next month with scans. I'll see him back in October."   Other notable issues, if any: Skin in treatment field looks well healed. Occasional sharp pain around jaw/ear when eating (unable to remember if bilateral or unilateral), but states it resolves immediately. Mild residual swelling to neck. Reports improvement to dry mouth, and thick saliva has resolved. Overall feels he's slowly getting much better.  Vitals:   11/13/19 1444  BP: 123/89  Pulse: 84  Resp: 18  Temp: 98.4 F (36.9 C)  SpO2: 98%

## 2019-11-13 ENCOUNTER — Ambulatory Visit
Admission: RE | Admit: 2019-11-13 | Discharge: 2019-11-13 | Disposition: A | Discharge status: Home / Self Care | Payer: Managed Care, Other (non HMO) | Source: Ambulatory Visit | Attending: Radiation Oncology | Admitting: Radiation Oncology

## 2019-11-13 ENCOUNTER — Other Ambulatory Visit: Payer: Self-pay

## 2019-11-13 VITALS — BP 123/89 | HR 84 | Temp 98.4°F | Resp 18 | Ht 70.0 in | Wt 150.0 lb

## 2019-11-13 DIAGNOSIS — Z923 Personal history of irradiation: Secondary | ICD-10-CM | POA: Insufficient documentation

## 2019-11-13 DIAGNOSIS — Z7982 Long term (current) use of aspirin: Secondary | ICD-10-CM | POA: Diagnosis not present

## 2019-11-13 DIAGNOSIS — I700 Atherosclerosis of aorta: Secondary | ICD-10-CM | POA: Insufficient documentation

## 2019-11-13 DIAGNOSIS — Z7984 Long term (current) use of oral hypoglycemic drugs: Secondary | ICD-10-CM | POA: Diagnosis not present

## 2019-11-13 DIAGNOSIS — M542 Cervicalgia: Secondary | ICD-10-CM | POA: Diagnosis not present

## 2019-11-13 DIAGNOSIS — Z85819 Personal history of malignant neoplasm of unspecified site of lip, oral cavity, and pharynx: Secondary | ICD-10-CM | POA: Diagnosis not present

## 2019-11-13 DIAGNOSIS — R110 Nausea: Secondary | ICD-10-CM | POA: Insufficient documentation

## 2019-11-13 DIAGNOSIS — C090 Malignant neoplasm of tonsillar fossa: Secondary | ICD-10-CM

## 2019-11-13 DIAGNOSIS — C01 Malignant neoplasm of base of tongue: Secondary | ICD-10-CM

## 2019-11-13 DIAGNOSIS — B370 Candidal stomatitis: Secondary | ICD-10-CM | POA: Diagnosis not present

## 2019-11-13 DIAGNOSIS — Z79899 Other long term (current) drug therapy: Secondary | ICD-10-CM | POA: Diagnosis not present

## 2019-11-13 NOTE — Progress Notes (Signed)
Oncology Nurse Navigator Documentation  I met with patient during his follow up appointment today with Dr. Isidore Moos. She provided him with the results of his recent CT scans. Mr. Eimers is aware that he will have another repeat CT chest in 3 months to evaluate inflammation seen on current CT chest. Dr. Isidore Moos will call him with those results. He has been scheduled to see Dr. Isidore Moos in January 2022. He is scheduled to see Dr. [name] in October and a MBSS is scheduled for 11/26/19. Mr. Ells knows to call me with any further questions or concerns.  Harlow Asa RN, BSN, OCN Head & Neck Oncology Nurse [facility] at [facility] Phone # [phone]  Fax # [phone]

## 2019-11-17 ENCOUNTER — Encounter: Payer: Self-pay | Admitting: Radiation Oncology

## 2019-11-17 ENCOUNTER — Other Ambulatory Visit: Payer: Self-pay

## 2019-11-17 ENCOUNTER — Ambulatory Visit: Payer: Managed Care, Other (non HMO) | Admitting: Nutrition

## 2019-11-17 DIAGNOSIS — Z1329 Encounter for screening for other suspected endocrine disorder: Secondary | ICD-10-CM

## 2019-11-17 DIAGNOSIS — C760 Malignant neoplasm of head, face and neck: Secondary | ICD-10-CM

## 2019-11-17 NOTE — Progress Notes (Signed)
Radiation Oncology         [phone] ________________________________  Name: Albert Flowers MRN: [id number]  Date: 11/13/2019  DOB: 02-Jan-1971  Follow-Up Visit Note  CC: [name], MD  [name], MD  Diagnosis and Prior Radiotherapy:       ICD-10-CM   1. Malignant neoplasm of tonsillar fossa ([place])  C09.0   2. 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 presents today for follow-up after completing radiation to the right tonsil bed (due to previous right tonsil cancer excised in 2020 ) and bilateral neck (he had had a left neck nodal recurrence salvaged surgically in 2021) on 07/24/2019  Pain issues, if any: Patient denies Using a feeding tube?: N/A Weight changes, if any:  loss - saw [name] on 08/04/2019  Wt Readings from Last 3 Encounters:  11/13/19 150 lb (68 kg)  11/05/19 150 lb 12.8 oz (68.4 kg)  08/07/19 171 lb 9.6 oz (77.8 kg)   Swallowing issues, if any: Patient is able to swallow, but has a lot of anxiety/hesitency to swallow as sometimes he gags.  [name]  PA recently started nystatin for possible thrush as well as Zofran.  The Zofran is helping with his gag reflex.  He is still dealing with dry mouth and thick saliva/mucus (especially as night)  Smoking or chewing tobacco? N/A Using fluoride trays daily? Unable to tolerate Last ENT visit was on: Scheduled to see Dr. [name] soon. Other notable issues, if any: Skin is healing well in treatment field. Still struggling with sensitive gag reflex. Saw [name]-PA last week about issues with nausea, thrush, and tongue sores. He reports that all 3 issues are improving.   He is still working.  He  reports some neck pain where he underwent neck dissection.                     ALLERGIES:  is allergic to penicillins.  Meds: Current Outpatient Medications  Medication Sig Dispense Refill  . acetaminophen (TYLENOL) 500 MG tablet Take 500 mg by mouth every 6 (six) hours as needed.    Marland Kitchen aspirin EC 81 MG tablet Take 81 mg by mouth daily.    . celecoxib (CELEBREX) 100 MG capsule Take 100 mg by mouth 2 (two) times daily.    Marland Kitchen LORazepam (ATIVAN) 0.5 MG tablet 0.5 mg SL q 6 hours prn nausea. Do not drive after taking this medication. 30 tablet 0  . metFORMIN (GLUCOPHAGE) 1000 MG tablet Take 1,000 mg by mouth 2 (two) times daily with a meal.     . naproxen (NAPROSYN) 500 MG tablet Take 500 mg by mouth 2 (two) times daily as needed.    . nystatin (MYCOSTATIN) 100000 UNIT/ML suspension Take 5 mLs (500,000 Units total) by mouth 4 (four) times daily. Swish and spit or swallow until better. 240 mL 0  . ondansetron (ZOFRAN ODT) 8 MG disintegrating tablet Take 1 tablet (8 mg total) by mouth every 8 (eight) hours as needed for nausea or vomiting. 20 tablet 2  . oxyCODONE (ROXICODONE) 5 MG/5ML solution 5 to 10 mg PO q 4 hours prn oral and throat pain 240 mL 0  .  rosuvastatin (CRESTOR) 20 MG tablet     . lidocaine (XYLOCAINE) 2 % solution Patient: Mix 1part 2% viscous lidocaine, 1part H20. Swish & swallow 21mL of diluted mixture, 36min before meals and at bedtime, up to QID (Patient not taking: Reported on 08/07/2019) 200 mL 4  . sodium fluoride (PREVIDENT 5000 PLUS) 1.1 % CREA dental cream Patient to use as directed on a toothbrush or alternatively in his fluoride trays as instructed.  Patient is to spit out excess and not swallow.  Repeat nightly. (Patient not taking: Reported on 08/07/2019) 2 Tube prn  . sucralfate (CARAFATE) 1 g tablet Dissolve 1 tablet in 10 mL H20 and swallow 20 min prior to meals and bedtime. (Patient not taking: Reported on 08/07/2019) 40 tablet 3   No current facility-administered  medications for this encounter.    Physical Findings: The patient is in no acute distress. Patient is alert and oriented. Wt Readings from Last 3 Encounters:  11/13/19 150 lb (68 kg)  11/05/19 150 lb 12.8 oz (68.4 kg)  08/07/19 171 lb 9.6 oz (77.8 kg)    height is 5\' 10"  (1.778 m) and weight is 150 lb (68 kg). His oral temperature is 98.4 F (36.9 C). His blood pressure is 123/89 and his pulse is 84. His respiration is 18 and oxygen saturation is 98%. .  General: Alert and oriented, in no acute distress. Thin. HEENT: Head is normocephalic. Extraocular movements are intact. Oropharynx is notable for no visible tumor.  There does appear to be a healed patency/tissue deficit in the right tonsillar pillar. Neck: Neck is notable no masses.  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 Skin: Skin in treatment fields shows satisfactory healing as above Psychiatric: Judgment and insight are intact. Affect is appropriate.   Lab Findings: Lab Results  Component Value Date   WBC 6.1 07/29/2019   HGB 14.2 07/29/2019   HCT 42.5 07/29/2019   MCV 92.8 07/29/2019   PLT 289 07/29/2019    Lab Results  Component Value Date   TSH 1.621 06/04/2018    Radiographic Findings: I have personally reviewed his imaging CT Soft Tissue Neck W Contrast  Result Date: 11/12/2019 CLINICAL DATA:  49 year old male with history of treated tonsillar carcinoma. Restaging. EXAM: CT NECK WITH CONTRAST TECHNIQUE: Multidetector CT imaging of the neck was performed using the standard protocol following the bolus administration of intravenous contrast. CONTRAST:  7mL OMNIPAQUE IOHEXOL 300 MG/ML  SOLN COMPARISON:  [facility] neck CT 04/24/2018, 04/07/2019. PET-CT 04/16/2019. Chest CT today reported separately. FINDINGS: Pharynx and larynx: Decreased left tonsillar soft tissue since January, with the pharyngeal soft tissue contours now fairly symmetric and within normal limits. Mild diffuse  pharyngeal space mucosal thickening, and moderate thickening of the epiglottis compatible with interval radiation. Associated small retropharyngeal effusion. Small volume retained secretions in the pharynx. The superior parapharyngeal spaces are normal aside from chronic surgical clips on the right. Salivary glands: Negative sublingual space. Chronic postoperative changes to the right submandibular space with absence of the submandibular gland, and superimposed bilateral radiation changes with interval atrophy and hyperenhancement of the left submandibular gland. Similar but less pronounced post radiation changes to the parotid glands. Thyroid: Negative. Lymph nodes: Surgical clips now in the left neck at the level 2 nodal stations with no residual of the malignant appearing level 2 a lymph node in January. A small node at the junctions of left level IIb and IIIb mildly enhances and is 5 mm short axis,  1 mm smaller since January. 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 Vascular: The superior left IJ is now highly diminutive but does remain patent along with the left sigmoid sinus. Other major vascular structures in the neck and at the skull base are patent. Limited intracranial: Negative. Visualized orbits: Negative. Mastoids and visualized paranasal sinuses: Clear. Skeleton: No acute or suspicious osseous lesion. Upper chest: Reported separately today. IMPRESSION: 1. NI-RADS category 1. Postoperative and post radiation changes to the neck with no residual tumor or lymphadenopathy. 2. CT Chest today reported separately. Electronically Signed   By: Genevie Ann M.D.   On: 11/12/2019 15:36   CT Chest W Contrast  Result Date: 11/12/2019 CLINICAL DATA:  Head neck cancer, assess treatment response EXAM: CT CHEST WITH CONTRAST TECHNIQUE: Multidetector CT imaging of the chest was performed during intravenous contrast administration. CONTRAST:  71mL OMNIPAQUE IOHEXOL 300 MG/ML  SOLN COMPARISON:  Neck CTs  both from within system an outside of system. FINDINGS: Cardiovascular: Thoracic aorta is normal caliber. Heart size is normal without pericardial effusion. Central pulmonary vasculature is unremarkable. Mediastinum/Nodes: No mediastinal lymphadenopathy. No hilar lymphadenopathy. No axillary lymphadenopathy. Stranding in the low neck better demonstrated on the neck CT acquired on the same date compatible with post treatment changes, incompletely imaged. Esophagus grossly normal. Lungs/Pleura: Patchy ground-glass opacities in the LEFT lung base with some tree-in-bud nodularity. No discrete, suspicious pulmonary nodule. Ground-glass changes also in the RIGHT lung base to a lesser extent with a small area measuring approximately 1.5 by 1 cm. Patchy areas with similar appearance present in the LEFT chest as described. The airways are patent. No current bronchial wall thickening. Calcified granulomatous changes in the RIGHT hilum and RIGHT middle lobe. Upper Abdomen: Incidental imaging of upper abdominal contents without acute process. The adrenal glands are normal. Musculoskeletal: No acute bone finding. No destructive bone process. IMPRESSION: 1. Patchy ground-glass predominantly at the LEFT lung base, mild bronchial wall thickening in areas subtending affected areas of lung with ground-glass nodule in the RIGHT chest without bronchial wall thickening. Findings may be indicative of pneumonitis, perhaps from aspiration in this patient with head and neck cancer. Suggest short interval follow-up to ensure resolution, within 8-12 weeks. 2. Stranding in the low neck better demonstrated on the neck CT acquired on the same date compatible with post treatment changes, incompletely imaged. 3. Calcified granulomatous changes in the RIGHT hilum and RIGHT middle lobe. 4. Aortic atherosclerosis. Aortic Atherosclerosis (ICD10-I70.0). Electronically Signed   By: Zetta Bills M.D.   On: 11/12/2019 14:28    Impression/Plan:     1) Head and Neck Cancer Status: NED  2) Nutritional Status: Evidence of weight loss.  Continue to follow the instructions of the nutritionist and push intake as tolerated. Wt Readings from Last 3 Encounters:  11/13/19 150 lb (68 kg)  11/05/19 150 lb 12.8 oz (68.4 kg)  08/07/19 171 lb 9.6 oz (77.8 kg)    3) Swallowing: Functional, continue speech-language pathology exercises  4) Dental: Encouraged to continue regular followup with dentistry, and dental hygiene including fluoride rinses.    5) Thyroid function: We will check annually Lab Results  Component Value Date   TSH 1.621 06/04/2018    6) Lymphedema, neck pain, deconditioning: Harlow Asa will arrange referral to physical therapy.  7) Other: We will arrange another repeat CT chest without contrast in 3 months to evaluate likely inflammation seen on current CT chest. I will call him with those results. He has been scheduled to  see me in person in January 2022. He is scheduled to see Dr. [name] in October and a MBSS is scheduled for 11/26/19. Mr. Bramer knows to call me with any further questions or concerns.   On date of service, in total, I spent 25 minutes on this encounter.  The patient was seen in person  _____________________________________   [name], MD

## 2019-11-17 NOTE — Progress Notes (Signed)
Contacted patient by telephone for nutrition follow up. Wt was stable at 150.8 pounds on August 19 and 150 pounds August 27.  Patient reports he has a poor appetite and experiences some early satiety.  He no longer is drinking ensure.  He is trying to drink milkshakes but he is very tired of dairy foods.  He still is experiencing some dry mouth.  He has difficulty tolerating meat of any kind.  Nutrition diagnosis: Inadequate oral intake appears improved.  Intervention: Reviewed an increased variety of soft moist foods that have lots of calories and protein. Encouraged patient to eat small amounts of food throughout the day. Provided support and encouragement.  Monitoring, evaluation, goals: Patient will tolerate increased calories and protein to minimize weight loss.  Next visit: Patient to be scheduled as needed.  **Disclaimer: This note was dictated with voice recognition software. Similar sounding words can inadvertently be transcribed and this note may contain transcription errors which may not have been corrected upon publication of note.**

## 2019-11-26 ENCOUNTER — Ambulatory Visit (HOSPITAL_COMMUNITY): Payer: Managed Care, Other (non HMO)

## 2019-11-26 ENCOUNTER — Inpatient Hospital Stay (HOSPITAL_COMMUNITY): Admission: RE | Admit: 2019-11-26 | Payer: Managed Care, Other (non HMO) | Source: Ambulatory Visit

## 2019-11-26 ENCOUNTER — Telehealth: Payer: Self-pay | Admitting: *Deleted

## 2019-11-26 NOTE — Telephone Encounter (Signed)
Called patient to inform that scan has not been pre-auth yet, and it is not until Nov., lvm for a return call

## 2019-11-27 ENCOUNTER — Ambulatory Visit (HOSPITAL_COMMUNITY): Payer: Managed Care, Other (non HMO)

## 2019-11-27 ENCOUNTER — Encounter (HOSPITAL_COMMUNITY): Payer: Managed Care, Other (non HMO)

## 2019-12-03 ENCOUNTER — Ambulatory Visit: Payer: Managed Care, Other (non HMO) | Attending: Radiation Oncology

## 2019-12-03 ENCOUNTER — Ambulatory Visit (HOSPITAL_COMMUNITY)
Admission: RE | Admit: 2019-12-03 | Discharge: 2019-12-03 | Disposition: A | Discharge status: Home / Self Care | Payer: Managed Care, Other (non HMO) | Source: Ambulatory Visit | Attending: Radiation Oncology | Admitting: Radiation Oncology

## 2019-12-03 ENCOUNTER — Other Ambulatory Visit: Payer: Self-pay

## 2019-12-03 DIAGNOSIS — R1310 Dysphagia, unspecified: Secondary | ICD-10-CM | POA: Insufficient documentation

## 2019-12-03 DIAGNOSIS — C01 Malignant neoplasm of base of tongue: Secondary | ICD-10-CM | POA: Diagnosis not present

## 2019-12-03 DIAGNOSIS — R293 Abnormal posture: Secondary | ICD-10-CM | POA: Insufficient documentation

## 2019-12-03 DIAGNOSIS — M6281 Muscle weakness (generalized): Secondary | ICD-10-CM | POA: Insufficient documentation

## 2019-12-03 DIAGNOSIS — M25512 Pain in left shoulder: Secondary | ICD-10-CM | POA: Diagnosis present

## 2019-12-03 NOTE — Therapy (Signed)
[facility] [address] [place], [place], [address] Phone: [phone]   Fax:  [phone]  Speech Language Pathology Treatment  Patient Details  Name: Albert Flowers MRN: [id number] Date of Birth: 12-25-1970 Referring Provider (SLP): [name], MD   Encounter Date: 12/03/2019   End of Session - 12/03/19 1636    Visit Number 6    Number of Visits 7    Date for SLP Re-Evaluation 12/18/19    SLP Start Time 1536    SLP Stop Time  0347    SLP Time Calculation (min) 39 min    Activity Tolerance Patient tolerated treatment well           Past Medical History:  Diagnosis Date  . Diabetes mellitus without complication [facility])     Past Surgical History:  Procedure Laterality Date  . left neck dissection Left 05/04/2019   Left Neck Dissection by Dr. [name] at [facility].   . neck sugery     as a child "had a knot removed from neck" not sure which side.   . RIght neck dissection Right 06/16/2018   TORS and right neck dissection. Dr. [name] at [facility]    There were no vitals filed for this visit.   Subjective Assessment - 12/03/19 1548    Subjective "It was interesting. I could see what was going on, on the video." (pt, re: modified (MBSS) this AM)    Currently in Pain? No/denies                 ADULT SLP TREATMENT - 12/03/19 1552      General Information   Behavior/Cognition Alert;Cooperative;Pleasant mood      Treatment Provided   Treatment provided Dysphagia      Dysphagia Treatment   Temperature Spikes Noted No    Respiratory Status Room air    Oral Cavity - Dentition Adequate natural dentition    Treatment Methods Skilled observation;Therapeutic exercise;Patient/caregiver education    Patient observed directly with PO's Yes    Type of PO's observed Dysphagia 3 (soft);Thin liquids    Feeding Able to feed self    Liquids provided via Cup    Oral Phase Signs & Symptoms Prolonged mastication     Pharyngeal Phase Signs & Symptoms Immediate throat clear;Multiple swallows;Complaints of residue   clearing after liquids not solids   Type of cueing --   cue to hock at end of POs   Other treatment/comments SLP reviewed results and recommendations of modified (MBSS) this morning. Pt acknowledged precautions, that he was doing many of them already (which SLP has seen in previous session/s), and that he needs to complete HEP as directed.       Assessment / Recommendations / Plan   Plan Continue with current plan of care      Progression Toward Goals   Progression toward goals Progressing toward goals          From MBSS this morning: "Decreased pharyngeal contraction, tongue base retraction and laryngeal closure results in retention.  Pt largely senses retention and conducts dry swallows with breath hold with liquids to clear.  Laryngeal penetration occurs with liquids after swallows due to vallecular more than pyriform retention spilling into open airway.  Fortunately, pt clears this with extended breath holding with multiple dry swallows. Please see detailed entry at "pharyngeal" area comments.    "Recommend pt continue diet as tolerated with use of strategies for maximal airway protection including starting all intake with liquids,  multiple dry swallows per bolus, following solids with liquids, effortful swallow, "hock" and expectorate prn,medicine with pudding, etc.  "Pharyngeal:  Pt independently conducted extended breath hold with multiple swallows with each bolus which helped to decrease vallecular retention but also squeeze out laryngeal penetrates. Chin tuck with solid/liquids did not prevent or decrease retention. Effortful swallows effective however.  Pt is conducting multiple swallows with each bolus - eg with 3 sequential liquid swallows, he will conduct 7 swallows.  With initial swallow of puree, approx 20% transited into esophagus with remaining at retained at vallecular region.  Pt  did expectorate x1 per SLP cue, after puree remained in pharynx despite consumption of single thin liquid bolus (with 3 swallows).  With cracker bolus, approx 50% was retained at vallecular region after swallow - but thin boluses x2 did assist to decrease retention.  Tablet not transited into pharynx despite 2 attempts with thin liquids, effectively clear into esophagus with pudding bolus. (although 50% of pudding bolus retained in vallecular region).  Xray turned on x1 after nectar with minimal secretions mixed with barium noted in trachea."          SLP Education - 12/03/19 1635    Education Details MBSS results and recommendations/precautions    Person(s) Educated Patient    Methods Explanation;Demonstration    Comprehension Verbalized understanding;Returned demonstration            SLP Short Term Goals - 09/17/19 1501      SLP SHORT TERM GOAL #1   Title Pt will complete HEP with rare min A over two sessions    Baseline 07-23-19, 08-20-19    Status Achieved      SLP SHORT TERM GOAL #2   Title Pt will tell SLP rationale for HEP completion    Status Achieved      SLP SHORT TERM GOAL #3   Title Pt will tell SLP 3 overt s/s aspiration PNA with modified independence    Status Achieved            SLP Long Term Goals - 12/03/19 1639      SLP LONG TERM GOAL #1   Title Pt will complete HEP with modified independence over 5 visits    Baseline 07-23-19, 09-17-19, 12-03-19    Time 1    Period --   sessions, for all LTGs   Status Revised      SLP LONG TERM GOAL #2   Title Pt will tell SLP when to decr frequency of HEP    Time 2    Status On-going      SLP LONG TERM GOAL #3   Title follow swallow precautions from MBSS on 12-03-19 with rare min A over two sessions    Time 3    Status New            Plan - 12/03/19 1636    Clinical Impression Statement Pt presents today with [facility] swallowing ability with dys III (ham sandwich) and water. No overt s/sx aspiration PNA reported or  observed today. Pt with MBSS today - suspect pt's difficulties are due to surgical changes and changes from radiation. Dys III/thin recommended with precautions of start meals with liquids, alternate liquids/solds, intermittent clear and reswallow, small bites/sips, slow rate, effortful swallow, adn end meal with "hock" and swallow or expectorate. SLP to keep all pt's exercises on his HEP given data ascertained from today's MBSS. Data suggests that as pts progress through rad or chemorad therapy that their swallowing ability  will decrease. Also, WNL swallowing is threatened by muscle fibrosis that will likely develop after rad/chemorad is completed. Therefore, SLP developed an indivdualized exercise program to mitigate/eliminate pt's muscle fibrosis following and as a result of, pt's rad tx. Skilled ST would cont to be beneficial to the pt in order to regularly assess pt's safety with POs and/or need for instrumental swallow assessment, as well as to assess accurate completion of swallowing HEP.    Speech Therapy Frequency --   once approx every 4 weeks   Duration --   6 follow up visits   Treatment/Interventions Aspiration precaution training;Pharyngeal strengthening exercises;Diet toleration management by SLP;Trials of upgraded texture/liquids;Internal/external aids;Patient/family education;SLP instruction and feedback;Compensatory techniques    Potential to Achieve Goals Good    SLP Home Exercise Plan provided today    Consulted and Agree with Plan of Care Patient           Patient will benefit from skilled therapeutic intervention in order to improve the following deficits and impairments:   Dysphagia, unspecified type    Problem List Patient Active Problem List   Diagnosis Date Noted  . Malignant neoplasm of tonsillar fossa ([place]) 06/01/2019  . Malignant neoplasm of base of tongue ([place]) 06/02/2018  . Head and neck cancer ([place]) 05/14/2018    [facility] ,[place], [place]  12/03/2019, 4:40  PM  [facility] [address] [place], [place], [address] Phone: [phone]   Fax:  [phone]   Name: Albert Flowers MRN: [id number] Date of Birth: 1971/03/11

## 2019-12-03 NOTE — Patient Instructions (Signed)
Look at the sheet Tammy gave you to see the things you should do when you eat.  Remember to start your meal with a drink and end it with a "hock"

## 2019-12-08 ENCOUNTER — Other Ambulatory Visit: Payer: Self-pay

## 2019-12-08 ENCOUNTER — Ambulatory Visit: Payer: Managed Care, Other (non HMO) | Admitting: Rehabilitation

## 2019-12-08 ENCOUNTER — Encounter: Payer: Self-pay | Admitting: Rehabilitation

## 2019-12-08 DIAGNOSIS — M6281 Muscle weakness (generalized): Secondary | ICD-10-CM

## 2019-12-08 DIAGNOSIS — M25512 Pain in left shoulder: Secondary | ICD-10-CM

## 2019-12-08 DIAGNOSIS — R1310 Dysphagia, unspecified: Secondary | ICD-10-CM | POA: Diagnosis not present

## 2019-12-08 DIAGNOSIS — R293 Abnormal posture: Secondary | ICD-10-CM

## 2019-12-08 NOTE — Therapy (Signed)
[facility] [place], [place], [address] Phone: [phone]   Fax:  [phone]  Physical Therapy Evaluation  Patient Details  Name: Albert Flowers MRN: [id number] Date of Birth: 05-31-1970 Referring Provider (PT): Reita May Date: 12/08/2019   PT End of Session - 12/08/19 0857    Visit Number 2    Number of Visits 14    Date for PT Re-Evaluation 01/19/20    PT Start Time 0805    PT Stop Time 3149    PT Time Calculation (min) 50 min    Activity Tolerance Patient tolerated treatment well    Behavior During Therapy [facility] for tasks assessed/performed           Past Medical History:  Diagnosis Date  . Diabetes mellitus without complication [facility])     Past Surgical History:  Procedure Laterality Date  . left neck dissection Left 05/04/2019   Left Neck Dissection by Dr. [name] at [facility].   . neck sugery     as a child "had a knot removed from neck" not sure which side.   . RIght neck dissection Right 06/16/2018   TORS and right neck dissection. Dr. [name] at [facility]    There were no vitals filed for this visit.    Subjective Assessment - 12/08/19 0810    Subjective I think it is swollen and left arm pain my arm I am also having back pain since surgery I see the orthopedic MD for the back next Monday    Pertinent History L tonsillectomy and lymph nodes on Feb 15 , had diffiuculty raising left shoulder afterward . 06/06/2018 neck  dissection on right side for tonsil and base of tongue cancer, 06/06/19 removed the rest of the tonsil and more lymph nodes on the Lt with nerve damage. Radiation completed to the anterior neck completed.  Past hx of DM    Limitations Lifting;House hold activities    Patient Stated Goals improve arm and assess swelling    Currently in Pain? 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  No    Is the patient reluctant to leave their home because of a fear of falling?  No      Home Environment   Living Environment Private residence    Living Arrangements Spouse/significant other;Children    Available Help at Discharge Family;Available PRN/intermittently      Prior Function  Level of Independence Independent    Vocation Full time employment    Vocation Requirements office and walk building [employer]     Leisure tennis       Cognition   Overall Cognitive Status Within Functional Limits for tasks assessed      Observation/Other Assessments   Observations well healed bilateral incisions with some firmness from general edema and radiation fibrosis       Coordination   Gross Motor Movements are Fluid and Coordinated No      Posture/Postural Control   Posture/Postural Control Postural limitations    Postural Limitations Rounded Shoulders;Forward head      ROM / Strength   AROM / PROM / Strength PROM      AROM   Overall AROM Comments Left scapular winging and UT atrophy present     Right Shoulder Flexion 172 Degrees    Right Shoulder ABduction 172 Degrees    Right Shoulder Internal Rotation 70 Degrees    Right Shoulder External Rotation 80 Degrees    Left Shoulder Flexion 70  Degrees   pain   Left Shoulder ABduction 53 Degrees   pain   Left Shoulder Internal Rotation --   too painful   Left Shoulder External Rotation --   too painful   Cervical Flexion 55    Cervical Extension 53    Cervical - Right Side Bend 35    Cervical - Left Side Bend 45    Cervical - Right Rotation 65    Cervical - Left Rotation 65      PROM   Overall PROM Comments Rt shoulder WNL, Lt shoulder WNL slightly limited into ER with some pectoralis tightness              LYMPHEDEMA/ONCOLOGY QUESTIONNAIRE - 12/08/19 0001      Head and Neck   4 cm superior to sternal notch around neck 41.6 cm    6 cm superior to sternal notch around neck 41.3 cm    8 cm superior to sternal notch around neck 41.1 cm                   Objective measurements completed on examination: See above findings.       [place] Adult PT Treatment/Exercise - 12/08/19 0001      Exercises   Exercises Shoulder      Shoulder Exercises: Supine   Protraction Both;5 reps    Protraction Limitations protraction/retraction      Shoulder Exercises: Seated   External Rotation Left;5 reps    External Rotation Limitations 5: hold; isometric activation      Shoulder Exercises: Prone   Retraction Limitations prone row also addded to HEP but not performed today       Shoulder Exercises: Stretch   Other Shoulder Stretches UT stretch bil x 20", cervical extension stretch with hands blocking clavicles x 20"                  PT Education - 12/08/19 0856    Education Details HEP, POC    Person(s) Educated Patient    Methods Explanation;Demonstration;Tactile cues;Verbal cues;Handout    Comprehension Verbalized understanding;Returned demonstration;Verbal cues required;Tactile cues required;Need further instruction               PT Long Term Goals - 12/08/19 0902      PT LONG TERM GOAL #1   Title Pt will improve left shoulder active abduction to at least 100 degrees to make  reaching easier     Baseline 53    Time 6    Period Weeks    Status New      PT LONG TERM GOAL #2   Title Pt will improve left shoulder flexion AROM to at least 100 to improve ease of reaching    Baseline 70    Time 6    Period Weeks    Status New      PT LONG TERM GOAL #3   Title Pt will be ind with final HEP    Time 6    Period Weeks    Status New                  Plan - 12/08/19 0857    Clinical Impression Statement Pt returns post neck radiation with worsening of his Left shoulder pain and decreasing AROM and strength.  Overall pt has no changes in his neck measurements but has some firmness most likely more so radiation fibrosis related with some tightness anteriorly with cervical AROM.  The left shoulder has decreased AROM by about 50% with visible atrophy in the left UT and left scapular protraction and mild winging worse with AROM into abduction.  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  Malignant neoplasm of tonsillar fossa ([place]) 06/01/2019  . Malignant neoplasm of base of tongue ([place]) 06/02/2018  . Head and neck cancer ([place]) 05/14/2018    Stark Bray 12/08/2019, 9:04 AM  [place], [place], [address] Phone: [phone]   Fax:  [phone]  Name: Chrsitopher Wik MRN: [id number] Date of Birth: December 30, 1970

## 2019-12-08 NOTE — Patient Instructions (Signed)
Access Code: [email or web address]: [email or web address]: 09/21/2021Prepared by: Marcene Brawn TevisExercises  Seated Cervical Sidebending Stretch - 1 x daily - 7 x weekly - 1-3 sets - 10 reps - 20-30 seconds hold  Cervical Extension Stretch - 1 x daily - 7 x weekly - 1-3 sets - 10 reps - 5-10 seconds hold  Supine Bilateral Shoulder Protraction - 1 x daily - 7 x weekly - 1-3 sets - 10 reps - 2-3 second hold  Prone Scapular Retraction and Row - 1 x daily - 7 x weekly - 1-3 sets - 10 reps - no hold  Isometric Shoulder External Rotation - 1 x daily - 7 x weekly - 1-3 sets - 10 reps - 6-10 seconds hold

## 2019-12-16 ENCOUNTER — Other Ambulatory Visit: Payer: Self-pay

## 2019-12-16 ENCOUNTER — Ambulatory Visit: Payer: Managed Care, Other (non HMO) | Admitting: Physical Therapy

## 2019-12-16 ENCOUNTER — Encounter: Payer: Self-pay | Admitting: Physical Therapy

## 2019-12-16 DIAGNOSIS — R293 Abnormal posture: Secondary | ICD-10-CM

## 2019-12-16 DIAGNOSIS — M25512 Pain in left shoulder: Secondary | ICD-10-CM

## 2019-12-16 DIAGNOSIS — M6281 Muscle weakness (generalized): Secondary | ICD-10-CM

## 2019-12-16 DIAGNOSIS — R1310 Dysphagia, unspecified: Secondary | ICD-10-CM | POA: Diagnosis not present

## 2019-12-16 NOTE — Patient Instructions (Signed)
Lie on right side with left arm long your body. Keep shoulder down away from ear.  Raise left arm up  A few inches feeling the deltoid muscle at the shoulder contract.    Lie on your back with knees bent up   hand at your sides. Press both hands down into the bed hold for count of 5 repeat 10 times    Lower trunk rotation, knees bent up and roll knees from side to side.

## 2019-12-16 NOTE — Therapy (Signed)
[place], [place], [address] Phone: [phone]   Fax:  [phone]  Physical Therapy Treatment  Patient Details  Name: Albert Flowers MRN: [id number] Date of Birth: 1970/06/15 Referring Provider (PT): Reita May Date: 12/16/2019   PT End of Session - 12/16/19 1910    Visit Number 3    Number of Visits 14    Date for PT Re-Evaluation 01/19/20    PT Start Time 1300    PT Stop Time 1345    PT Time Calculation (min) 45 min    Activity Tolerance Patient tolerated treatment well    Behavior During Therapy [facility] for tasks assessed/performed           Past Medical History:  Diagnosis Date  . Diabetes mellitus without complication [facility])     Past Surgical History:  Procedure Laterality Date  . left neck dissection Left 05/04/2019   Left Neck Dissection by Dr. [name] at [facility].   . neck sugery     as a child "had a knot removed from neck" not sure which side.   . RIght neck dissection Right 06/16/2018   TORS and right neck dissection. Dr. [name] at [facility]    There were no vitals filed for this visit.   Subjective Assessment - 12/16/19 1304    Subjective Pt said that the doctor said that he should have physical theapy. He said that his pain in more right upper back than lower back and it seems to move a little bit as he had pain in his left back    Pertinent History L tonsillectomy and lymph nodes on Feb 15 , had diffiuculty raising left shoulder afterward . 06/06/2018 neck  dissection on right side for tonsil and base of tongue cancer, 06/06/19 removed the rest of the tonsil and more lymph nodes on the Lt with nerve damage. Radiation completed to the anterior neck completed.  Past hx of DM    Patient Stated Goals improve arm and assess swelling    Currently in Pain? Yes    Pain Score 2     Pain Location Shoulder    Pain Orientation Left    Pain Descriptors / Indicators Throbbing    Pain  Type Chronic pain    Pain Radiating Towards the whole arm    Pain Onset More than a month ago    Pain Frequency Intermittent    Aggravating Factors  moving his arm    Multiple Pain Sites Yes    Pain Score 6    Pain Location Back    Pain Orientation Right;Upper;Mid    Pain Descriptors / Indicators Burning;Throbbing    Pain Type Chronic pain    Pain Radiating Towards no    Pain Onset More than a month ago    Pain Frequency Intermittent    Aggravating Factors  can't say    Pain Relieving Factors pain med    Effect of Pain on Daily Activities pain keeps him up at night                             [facility] Adult PT Treatment/Exercise - 12/16/19 0001      Exercises   Exercises Shoulder;Lumbar      Lumbar Exercises: Stretches   Other Lumbar Stretch Exercise lower trunk rotation with pt feeling a stretch in back       Shoulder Exercises: Supine   Protraction  AROM;Left;10 reps    Other Supine Exercises both hands at side pressing down into mat for isometrics back body exercise       Shoulder Exercises: Sidelying   External Rotation AROM;Left;10 reps    External Rotation Limitations pt needed to have manual stabalization of  G-H joint for pt to do this painfree     ABduction AROM;Left;5 reps   to 30 degrees for good quality movment    Other Sidelying Exercises manual resistance for isometric contraction of ant, middle and posterior deltoid.      Manual Therapy   Manual Therapy Soft tissue mobilization    Soft tissue mobilization with pt in right sidelying and with coconut oil to left neck, tight areas at bilateral erector spinae in thoracic region                   PT Education - 12/16/19 1910    Education Details HEP               PT Long Term Goals - 12/08/19 0902      PT LONG TERM GOAL #1   Title Pt will improve left shoulder active abduction to at least 100 degrees to make reaching easier    Baseline 53    Time 6    Period Weeks    Status  New      PT LONG TERM GOAL #2   Title Pt will improve left shoulder flexion AROM to at least 100 to improve ease of reaching    Baseline 70    Time 6    Period Weeks    Status New      PT LONG TERM GOAL #3   Title Pt will be ind with final HEP    Time 6    Period Weeks    Status New                 Plan - 12/16/19 1911    Clinical Impression Statement Pt states the Dr. Mardelle Matte wants him to have PT for his back and the prescription was sent to the Ortho side (?) he would like to be treated at this clinic for both body parts if he can.  Treatment today focused on neck and shoulder weakness and with soft tissue work followed by exercise to help decrease his main complaint of pain.  Pt reported he had not pain at end of session    Personal Factors and Comorbidities Comorbidity 3+    Comorbidities bilateral neck sugery for cancer, DM    Stability/Clinical Decision Making Stable/Uncomplicated    PT Frequency 2x / week    PT Duration 6 weeks    PT Treatment/Interventions ADLs/Self Care Home Management;Therapeutic exercise;Patient/family education;Passive range of motion;Taping    PT Next Visit Plan Follow up on back eval per Dr. 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 Malignant neoplasm  of tonsillar fossa ([place]) 06/01/2019  . Malignant neoplasm of base of tongue ([place]) 06/02/2018  . Head and neck  cancer ([place]) 05/14/2018   Donato Heinz. Owens Shark PT  Norwood Levo 12/16/2019, 7:15 PM  [place], [place], [address] Phone: [phone]   Fax:  [phone]  Name: Kyreese Chio MRN: [id number] Date of Birth: 1970/05/03

## 2019-12-22 ENCOUNTER — Ambulatory Visit: Payer: Managed Care, Other (non HMO) | Attending: Radiation Oncology | Admitting: Rehabilitation

## 2019-12-22 ENCOUNTER — Encounter: Payer: Self-pay | Admitting: Rehabilitation

## 2019-12-22 ENCOUNTER — Other Ambulatory Visit: Payer: Self-pay

## 2019-12-22 DIAGNOSIS — R1310 Dysphagia, unspecified: Secondary | ICD-10-CM | POA: Diagnosis present

## 2019-12-22 DIAGNOSIS — M545 Low back pain, unspecified: Secondary | ICD-10-CM | POA: Insufficient documentation

## 2019-12-22 DIAGNOSIS — M25512 Pain in left shoulder: Secondary | ICD-10-CM

## 2019-12-22 DIAGNOSIS — G8929 Other chronic pain: Secondary | ICD-10-CM | POA: Diagnosis present

## 2019-12-22 DIAGNOSIS — M6281 Muscle weakness (generalized): Secondary | ICD-10-CM

## 2019-12-22 DIAGNOSIS — R293 Abnormal posture: Secondary | ICD-10-CM | POA: Diagnosis not present

## 2019-12-22 NOTE — Patient Instructions (Signed)
Access Code: [email or web address]  URL: [email or web address]: 10/05/2021Prepared by: Marcene Brawn TevisExercises  Supine Bilateral Shoulder Protraction - 1-2 x daily - 7 x weekly - 1 sets - 10 reps - no hold  Prone Scapular Retraction and Row - 1-2 x daily - 7 x weekly - 1 sets - 10 reps - no hold  Standing Scapular Clock (1 to 7) - 3-4 x daily - 7 x weekly - 1 sets - 10 reps - no hold  Bilateral Scapular Depression with Anchored Resistance - Straight Arm - 1 x daily - 7 x weekly - 1 sets - 10 reps - 5 second hold

## 2019-12-22 NOTE — Therapy (Signed)
[place], [place], [address] Phone: [phone]   Fax:  [phone]  Physical Therapy Treatment  Patient Details  Name: Albert Flowers MRN: [id number] Date of Birth: [birth year] Referring Provider (PT): Reita May Date: 12/22/2019   PT End of Session - 12/22/19 2440    Visit Number 4    Number of Visits 14    Date for PT Re-Evaluation 01/19/20    PT Start Time 1300    PT Stop Time 1353    PT Time Calculation (min) 53 min    Activity Tolerance Patient tolerated treatment well    Behavior During Therapy [facility] for tasks assessed/performed           Past Medical History:  Diagnosis Date  . Diabetes mellitus without complication [facility])     Past Surgical History:  Procedure Laterality Date  . left neck dissection Left 05/04/2019   Left Neck Dissection by Dr. [name] at [facility].   . neck sugery     as a child "had a knot removed from neck" not sure which side.   . RIght neck dissection Right 06/16/2018   TORS and right neck dissection. Dr. [name] at [facility]    There were no vitals filed for this visit.   Subjective Assessment - 12/22/19 1301    Subjective The back pain is related to the weakness.  it justs hurts with activity like washing dishes and sleeping    Pertinent History L tonsillectomy and lymph nodes on Feb 15 , had diffiuculty raising left shoulder afterward . 06/06/2018 neck  dissection on right side for tonsil and base of tongue cancer, 06/06/19 removed the rest of the tonsil and more lymph nodes on the Lt with nerve damage. Radiation completed to the anterior neck completed.  Past hx of DM    Patient Stated Goals improve weakness and pain    Currently in Pain? No/denies              [facility] PT Assessment - 12/22/19 0001      Strength   Overall Strength Comments serratus Rt 4+/5, serratus on left hard to punch, UT bil 5/5 in seated,  in prone left rhomboids/mid trap 2/5 unable  to lift, extension 4/5, unable to test lower trap ,  SCM WNl bil     Right Shoulder Flexion 5/5    Right Shoulder ABduction 4/5   reproduce pain in the Rt lower trap*   Right Shoulder Internal Rotation 5/5    Right Shoulder External Rotation 5/5    Left Shoulder Flexion 2+/5    Left Shoulder ABduction 2/5    Left Shoulder Internal Rotation 4/5    Left Shoulder External Rotation 4-/5                         [facility] Adult PT Treatment/Exercise - 12/22/19 0001      Shoulder Exercises: Supine   Protraction Both;10 reps      Shoulder Exercises: Prone   Other Prone Exercises row x 10 with PT scapular retraction assistance      Shoulder Exercises: Sidelying   External Rotation AROM;Left;10 reps    External Rotation Limitations with PT scapular stab    ABduction AROM;Left;10 reps    ABduction Limitations with PT scapular stab      Shoulder Exercises: Standing   Other Standing Exercises yellow band extension/pull down isometric hold x 6" x 5  Manual Therapy   Manual Therapy Soft tissue mobilization;Scapular mobilization;Joint mobilization;Taping    Joint Mobilization GH AP glides in supine grade IV-3x30    Soft tissue mobilization with pt in right sidelying to bil paraspinals, rhomboids, and UT without tenderness today per pt    Scapular Mobilization in Rt sidelying: left scapular elevation, depression, retraction, protraction PNF paterns and isometric holds/manual resistance    Kinesiotex Facilitate Muscle      Kinesiotix   Facilitate Muscle  one long I strop from anterior shoulder towards lower trap with instruction on removal and care and with skin kote prior to applications                       PT Long Term Goals - 12/08/19 0902      PT LONG TERM GOAL #1   Title Pt will improve left shoulder active abduction to at least 100 degrees to make reaching easier    Baseline 53    Time 6    Period Weeks    Status New      PT LONG TERM GOAL #2    Title Pt will improve left shoulder flexion AROM to at least 100 to improve ease of reaching    Baseline 70    Time 6    Period Weeks    Status New      PT LONG TERM GOAL #3   Title Pt will be ind with final HEP    Time 6    Period Weeks    Status New                 Plan - 12/22/19 1357    Clinical Impression Statement Pain in the right mid back was reproduced with resisted Rt shoulder abduction in seated and seems to be mostly related to nerve palsy leading to pain with increased UE activity.  MMT was investigated more thoroughly today with pt showing no aftive movement of the mid trap in prone and no prone Lower trap activity as well.  UT, SCM seemingly WNL.  Focused today with scapular strengthening and reactivation and tape trial added today also for facilitation.    PT Frequency 2x / week    PT Duration 6 weeks    PT Treatment/Interventions ADLs/Self Care Home Management;Therapeutic exercise;Patient/family education;Passive range of motion;Taping    PT Next Visit Plan how was tape? 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 Malignant neoplasm of tonsillar fossa ([place]) 06/01/2019  . Malignant neoplasm of base of tongue ([place]) 06/02/2018  . Head and neck cancer ([place]) 05/14/2018    Stark Bray 12/22/2019, 2:00 PM  [place], [place], [address] Phone: [phone]   Fax:  [phone]  Name: Albert Flowers MRN:  [id number] Date of Birth: 1970-04-30

## 2019-12-24 ENCOUNTER — Encounter: Payer: Self-pay | Admitting: Rehabilitation

## 2019-12-24 ENCOUNTER — Other Ambulatory Visit: Payer: Self-pay

## 2019-12-24 ENCOUNTER — Ambulatory Visit: Payer: Managed Care, Other (non HMO) | Admitting: Rehabilitation

## 2019-12-24 DIAGNOSIS — M25512 Pain in left shoulder: Secondary | ICD-10-CM

## 2019-12-24 DIAGNOSIS — R293 Abnormal posture: Secondary | ICD-10-CM

## 2019-12-24 DIAGNOSIS — M6281 Muscle weakness (generalized): Secondary | ICD-10-CM

## 2019-12-24 NOTE — Therapy (Signed)
[place], [place], [address] Phone: [phone]   Fax:  [phone]  Physical Therapy Treatment  Patient Details  Name: Albert Flowers MRN: [id number] Date of Birth: [birth year] Referring Provider (PT): Reita May Date: 12/24/2019   PT End of Session - 12/24/19 9528    Visit Number 5    Number of Visits 14    Date for PT Re-Evaluation 01/19/20    PT Start Time 4132    PT Stop Time 1348    PT Time Calculation (min) 46 min    Activity Tolerance Patient tolerated treatment well    Behavior During Therapy [facility] for tasks assessed/performed           Past Medical History:  Diagnosis Date  . Diabetes mellitus without complication [facility])     Past Surgical History:  Procedure Laterality Date  . left neck dissection Left 05/04/2019   Left Neck Dissection by Dr. [name] at [facility].   . neck sugery     as a child "had a knot removed from neck" not sure which side.   . RIght neck dissection Right 06/16/2018   TORS and right neck dissection. Dr. [name] at [facility]    There were no vitals filed for this visit.   Subjective Assessment - 12/24/19 1305    Subjective It was a little sore but not bad.  The tape seems to help a bit I acutally did my car unlocking a bit easier    Pertinent History L tonsillectomy and lymph nodes on Feb 15 , had diffiuculty raising left shoulder afterward . 06/06/2018 neck  dissection on right side for tonsil and base of tongue cancer, 06/06/19 removed the rest of the tonsil and more lymph nodes on the Lt with nerve damage. Radiation completed to the anterior neck completed.  Past hx of DM    Patient Stated Goals improve weakness and pain    Currently in Pain? Yes    Pain Score 1     Pain Location Shoulder    Pain Orientation Left                             [facility] Adult PT Treatment/Exercise - 12/24/19 0001      Shoulder Exercises: Supine   Protraction  Both;10 reps    Protraction Limitations protraction/retraction    External Rotation Both;10 reps    Theraband Level (Shoulder External Rotation) Level 1 (Yellow)    External Rotation Limitations attempted but too much lat/pec activation to substitute so switched to isometric extension presses    Other Supine Exercises both hands at side pressing down into mat for isometrics back body exercise     Other Supine Exercises attempted 90deg of flexion wiggle stick but too difficulty bilaterally      Shoulder Exercises: Prone   Retraction Left;10 reps    Retraction Limitations with PT focus on retraction assist      Shoulder Exercises: Sidelying   External Rotation AROM;Left;10 reps    External Rotation Limitations with PT scapular stab    ABduction Limitations attempted today but with too much lat substitution so switched to abduction to 90deg isometric holds with PT letting go for hold x5" x 6 also with PT scapular stabilization    Other Sidelying Exercises with PT scapular stab isometrics into clock motions 5" hold of each and then isolating rhomboids 5" x 6  Shoulder Exercises: Standing   Protraction Limitations push up plus x 3 and then losing form     Row Both;10 reps    Theraband Level (Shoulder Row) Level 1 (Yellow)    Other Standing Exercises forearm on wall protraction/retraction able to do 3 before loss of form      Manual Therapy   Joint Mobilization GH AP glides in supine grade IV-3x30    Scapular Mobilization in Rt sidelying: left scapular elevation, depression, retraction, protraction PNF paterns and isometric holds/manual resistance                       PT Long Term Goals - 12/08/19 0902      PT LONG TERM GOAL #1   Title Pt will improve left shoulder active abduction to at least 100 degrees to make reaching easier    Baseline 53    Time 6    Period Weeks    Status New      PT LONG TERM GOAL #2   Title Pt will improve left shoulder flexion AROM to at  least 100 to improve ease of reaching    Baseline 70    Time 6    Period Weeks    Status New      PT LONG TERM GOAL #3   Title Pt will be ind with final HEP    Time 6    Period Weeks    Status New                 Plan - 12/24/19 1349    Clinical Impression Statement Pt with improved activation in prone row today but still demonstrates minimal rhomboid/upper trap activitiy with arm exercises although improving.  Latissimus/lower trap overactivation leads to shoulder IR with activation instead of retraction and pt able to do 3-4 of each scapular exercise before losing form.  Pt is starting to take note of correct shoulder posture during ADLs.    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 Malignant neoplasm of tonsillar fossa ([place]) 06/01/2019  . Malignant neoplasm of base of tongue ([place]) 06/02/2018  . Head and neck cancer ([place]) 05/14/2018    Albert Flowers 12/24/2019, 1:53 PM  [place], [place], [address] Phone: [phone]   Fax:  [phone]  Name: Albert Flowers MRN: [id number] Date of Birth: 1971-02-16

## 2019-12-29 ENCOUNTER — Encounter: Payer: Self-pay | Admitting: Physical Therapy

## 2019-12-29 ENCOUNTER — Ambulatory Visit: Payer: Managed Care, Other (non HMO) | Admitting: Physical Therapy

## 2019-12-29 ENCOUNTER — Other Ambulatory Visit: Payer: Self-pay

## 2019-12-29 DIAGNOSIS — M6281 Muscle weakness (generalized): Secondary | ICD-10-CM

## 2019-12-29 DIAGNOSIS — R293 Abnormal posture: Secondary | ICD-10-CM | POA: Diagnosis not present

## 2019-12-29 DIAGNOSIS — M25512 Pain in left shoulder: Secondary | ICD-10-CM

## 2019-12-29 NOTE — Therapy (Signed)
[place], [place], [address] Phone: [phone]   Fax:  [phone]  Physical Therapy Treatment  Patient Details  Name: Albert Flowers MRN: [id number] Date of Birth: 1970-05-10 Referring Provider (PT): Reita May Date: 12/29/2019   PT End of Session - 12/29/19 1356    Visit Number 6    Number of Visits 14    PT Start Time 2774    PT Stop Time 1350    PT Time Calculation (min) 45 min    Activity Tolerance Patient tolerated treatment well    Behavior During Therapy [facility] for tasks assessed/performed           Past Medical History:  Diagnosis Date  . Diabetes mellitus without complication [facility])     Past Surgical History:  Procedure Laterality Date  . left neck dissection Left 05/04/2019   Left Neck Dissection by Dr. [name] at [facility].   . neck sugery     as a child "had a knot removed from neck" not sure which side.   . RIght neck dissection Right 06/16/2018   TORS and right neck dissection. Dr. [name] at [facility]    There were no vitals filed for this visit.   Subjective Assessment - 12/29/19 1307    Subjective The tape is still there. I am assuming it helped. It is feeling a little better.    Pertinent History L tonsillectomy and lymph nodes on Feb 15 , had diffiuculty raising left shoulder afterward . 06/06/2018 neck  dissection on right side for tonsil and base of tongue cancer, 06/06/19 removed the rest of the tonsil and more lymph nodes on the Lt with nerve damage. Radiation completed to the anterior neck completed.  Past hx of DM    Patient Stated Goals improve weakness and pain    Currently in Pain? Yes    Pain Score 1     Pain Location Shoulder    Pain Orientation Left    Pain Descriptors / Indicators Throbbing    Pain Type Chronic pain    Pain Onset More than a month ago    Pain Frequency Intermittent                             [facility] Adult PT  Treatment/Exercise - 12/29/19 0001      Shoulder Exercises: Supine   Protraction Both;10 reps    External Rotation Both;10 reps;AROM    External Rotation Limitations attempted with yellow band again but pt demonstrated compensation so stopped    Other Supine Exercises both hands at side pressing down into mat for isometrics back body exercise - verbal cues for pressing shoulder back and avoid IR      Shoulder Exercises: Prone   Retraction Left;10 reps      Shoulder Exercises: Sidelying   External Rotation AROM;Left;10 reps    External Rotation Limitations with PT scapular stab    ABduction Limitations x10 with scap stabs then switched to abduction to 90deg isometric holds with PT letting go for hold x5" x 6 also with PT scapular stabilization    Other Sidelying Exercises with PT scapular stab isometrics into clock motions 5" hold of each and then isolating rhomboids 5" x 5      Shoulder Exercises: Standing   Protraction Limitations push up plus x 5    Row Both;10 reps    Theraband Level (Shoulder Row) Level 1 (Yellow)  Other Standing Exercises forearm on wall protraction/retraction x 10       Manual Therapy   Joint Mobilization GH AP glides in supine grade IV-3x30    Scapular Mobilization in Rt sidelying: left scapular elevation, depression, retraction, protraction with isometric holds and manual resistance      Kinesiotix   Facilitate Muscle  one long I strop from anterior shoulder towards lower trap - removed old tape and assessed skin first                       PT Long Term Goals - 12/08/19 0902      PT LONG TERM GOAL #1   Title Pt will improve left shoulder active abduction to at least 100 degrees to make reaching easier    Baseline 53    Time 6    Period Weeks    Status New      PT LONG TERM GOAL #2   Title Pt will improve left shoulder flexion AROM to at least 100 to improve ease of reaching    Baseline 70    Time 6    Period Weeks    Status New       PT LONG TERM GOAL #3   Title Pt will be ind with final HEP    Time 6    Period Weeks    Status New                 Plan - 12/29/19 1357    Clinical Impression Statement Continued to work on upper trap and rhomboid activation today through exercise. Pt still demonstrating compensatory muscle activation when attempting flexion and abduction with increased L shoulder pain. Pt was able to demosntrate more reps of scapular exercises today prior to loosing form. Continued to educate pt on importance of posture throughout day and when doing exercises.    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 Malignant neoplasm of tonsillar fossa ([place]) 06/01/2019  . Malignant neoplasm of base of tongue ([place]) 06/02/2018  . Head and neck cancer ([place]) 05/14/2018    Allyson Sabal [facility] 12/29/2019, 1:59 PM  [place], [place], [address] Phone: [phone]   Fax:  [phone]  Name: Albert Flowers MRN: [id number] Date of Birth: [birth year]  Manus Gunning, PT 12/29/19 1:59 PM

## 2019-12-31 ENCOUNTER — Encounter: Payer: Self-pay | Admitting: Rehabilitation

## 2019-12-31 ENCOUNTER — Other Ambulatory Visit: Payer: Self-pay

## 2019-12-31 ENCOUNTER — Ambulatory Visit: Payer: Managed Care, Other (non HMO) | Admitting: Rehabilitation

## 2019-12-31 DIAGNOSIS — R293 Abnormal posture: Secondary | ICD-10-CM | POA: Diagnosis not present

## 2019-12-31 DIAGNOSIS — M6281 Muscle weakness (generalized): Secondary | ICD-10-CM

## 2019-12-31 DIAGNOSIS — M25512 Pain in left shoulder: Secondary | ICD-10-CM

## 2019-12-31 DIAGNOSIS — R1310 Dysphagia, unspecified: Secondary | ICD-10-CM

## 2019-12-31 NOTE — Therapy (Signed)
[place], [place], [address] Phone: [phone]   Fax:  [phone]  Physical Therapy Treatment  Patient Details  Name: Albert Flowers MRN: [id number] Date of Birth: 12/03/1970 Referring Provider (PT): Reita May Date: 12/31/2019   PT End of Session - 12/31/19 1447    Visit Number 7    Number of Visits 14    Date for PT Re-Evaluation 01/19/20    PT Start Time 6962    PT Stop Time 1445    PT Time Calculation (min) 42 min    Activity Tolerance Patient tolerated treatment well    Behavior During Therapy [facility] for tasks assessed/performed           Past Medical History:  Diagnosis Date  . Diabetes mellitus without complication [facility])     Past Surgical History:  Procedure Laterality Date  . left neck dissection Left 05/04/2019   Left Neck Dissection by Dr. [name] at [facility].   . neck sugery     as a child "had a knot removed from neck" not sure which side.   . RIght neck dissection Right 06/16/2018   TORS and right neck dissection. Dr. [name] at [facility]    There were no vitals filed for this visit.   Subjective Assessment - 12/31/19 1405    Subjective I think I can move it a little better    Pertinent History L tonsillectomy and lymph nodes on Feb 15 , had diffiuculty raising left shoulder afterward . 06/06/2018 neck  dissection on right side for tonsil and base of tongue cancer, 06/06/19 removed the rest of the tonsil and more lymph nodes on the Lt with nerve damage. Radiation completed to the anterior neck completed.  Past hx of DM    Currently in Pain? No/denies              [facility] PT Assessment - 12/31/19 0001      AROM   Left Shoulder Flexion 114 Degrees    Left Shoulder ABduction 53 Degrees   with pain                        [facility] Adult PT Treatment/Exercise - 12/31/19 0001      Shoulder Exercises: Supine   Flexion AAROM;Left;10 reps    Other Supine  Exercises both hands at side at 45 deg of abduction pressing down into mat for isometrics back body exercise - verbal cues for deltoid and scapular activation      Shoulder Exercises: Sidelying   External Rotation AROM;Left;10 reps    External Rotation Limitations with PT scapular stab    ABduction Limitations attempted in sidelying but too painful also painful with elbow bent to 90deg.  AAROM then performed x 6 to around 60 degrees    Other Sidelying Exercises attempted horizontal abduction with elbow bent but too much on scapula      Manual Therapy   Joint Mobilization GH glides not needed today due to shoulder equal in spuine    Scapular Mobilization in Rt sidelying: left scapular elevation, depression, retraction, protraction with isometric holds and manual resistance      Kinesiotix   Facilitate Muscle  I strip still in place on shoulder and 1 Y strip added along posterior and middle deltoid                       PT Long Term Goals - 12/31/19  [place] #1   Title Pt will improve left shoulder active abduction to at least 100 degrees to make reaching easier    Baseline 53, 53 on 12/31/19    Time 6    Period Weeks    Status On-going      PT LONG TERM GOAL #2   Title Pt will improve left shoulder flexion AROM to at least 100 to improve ease of reaching    Baseline 70, 116 on 12/31/19    Status New      PT LONG TERM GOAL #3   Title Pt will be ind with final HEP    Status On-going                 Plan - 12/31/19 1448    Clinical Impression Statement Pt has improved flexion AROm greatly but no change in abduction AROM still with minimal activity past supraspinatus arch of activation during abduction.  Pt also continues with pain into active abduction and ER but no pain during PROM of the same.  Updated HEP and pt is now able to perform scapular isolation quicker and with good understanding.    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 Malignant neoplasm of tonsillar fossa ([place]) 06/01/2019  . Malignant neoplasm of base of tongue ([place]) 06/02/2018  . Head and neck cancer ([place]) 05/14/2018    Stark Bray 12/31/2019, 2:59 PM  [place], [place], [address] Phone: [phone]   Fax:  [phone]  Name: Albert Flowers MRN: [id number] Date of Birth: 1970/10/07

## 2020-01-05 ENCOUNTER — Ambulatory Visit: Payer: Managed Care, Other (non HMO)

## 2020-01-05 ENCOUNTER — Other Ambulatory Visit: Payer: Self-pay

## 2020-01-05 ENCOUNTER — Encounter: Payer: Self-pay | Admitting: Rehabilitation

## 2020-01-05 ENCOUNTER — Ambulatory Visit: Payer: Managed Care, Other (non HMO) | Admitting: Rehabilitation

## 2020-01-05 DIAGNOSIS — R1310 Dysphagia, unspecified: Secondary | ICD-10-CM

## 2020-01-05 DIAGNOSIS — M6281 Muscle weakness (generalized): Secondary | ICD-10-CM

## 2020-01-05 DIAGNOSIS — R293 Abnormal posture: Secondary | ICD-10-CM | POA: Diagnosis not present

## 2020-01-05 DIAGNOSIS — M25512 Pain in left shoulder: Secondary | ICD-10-CM

## 2020-01-05 NOTE — Therapy (Signed)
[place], [place], [address] Phone: [phone]   Fax:  [phone]  Physical Therapy Treatment  Patient Details  Name: Albert Flowers MRN: [id number] Date of Birth: 1970/07/09 Referring Provider (PT): Reita May Date: 01/05/2020   PT End of Session - 01/05/20 1355    Visit Number 8    Number of Visits 14    Date for PT Re-Evaluation 01/19/20    PT Start Time 9211    PT Stop Time 9417    PT Time Calculation (min) 49 min    Activity Tolerance Patient tolerated treatment well    Behavior During Therapy [facility] for tasks assessed/performed           Past Medical History:  Diagnosis Date  . Diabetes mellitus without complication [facility])     Past Surgical History:  Procedure Laterality Date  . left neck dissection Left 05/04/2019   Left Neck Dissection by Dr. [name] at [facility]   . neck sugery     as a child "had a knot removed from neck" not sure which side.   . RIght neck dissection Right 06/16/2018   TORS and right neck dissection. Dr. [name] at [facility]    There were no vitals filed for this visit.   Subjective Assessment - 01/05/20 1307    Subjective Nothing new.  The tape is still on    Pertinent History L tonsillectomy and lymph nodes on Feb 15 , had diffiuculty raising left shoulder afterward . 06/06/2018 neck  dissection on right side for tonsil and base of tongue cancer, 06/06/19 removed the rest of the tonsil and more lymph nodes on the Lt with nerve damage. Radiation completed to the anterior neck completed.  Past hx of DM    Currently in Pain? No/denies                             [facility] Adult PT Treatment/Exercise - 01/05/20 0001      Shoulder Exercises: Supine   Protraction Both;10 reps    Protraction Weight (lbs) 2    Flexion Left;10 reps    Shoulder Flexion Weight (lbs) 2    Diagonals Left;10 reps    Diagonals Weight (lbs) 2    Diagonals Limitations  AAROM for PNF pattern end range support     Other Supine Exercises both hands at side at 45 deg of abduction pressing down into mat for isometrics back body exercise - verbal cues for deltoid and scapular activation    Other Supine Exercises supine bil tricep press 2# x 12      Shoulder Exercises: Sidelying   External Rotation Left    External Rotation Weight (lbs) 2    External Rotation Limitations with PT scapular stab 3x6    ABduction AAROM;Left    ABduction Weight (lbs) --   2x6 AAROM for arm and scapular rotation to pain     Shoulder Exercises: Standing   Protraction Limitations push up plus x 10    Flexion Left;10 reps    Flexion Limitations with ranger    ABduction Left    ABduction Limitations attempted ranger into flexion but too painful.  switched to AAROM with dowel x 5 before pain    Other Standing Exercises forearm on wall protraction/retraction x 10       Manual Therapy   Scapular Mobilization in Rt sidelying: left scapular elevation, depression, retraction, protraction with isometric holds  and manual resistance    Kinesiotex Facilitate Muscle      Kinesiotix   Facilitate Muscle  I strip on shoulder and 1 Y strip added along posterior and middle deltoid                       PT Long Term Goals - 12/31/19 1405      PT LONG TERM GOAL #1   Title Pt will improve left shoulder active abduction to at least 100 degrees to make reaching easier    Baseline 53, 53 on 12/31/19    Time 6    Period Weeks    Status On-going      PT LONG TERM GOAL #2   Title Pt will improve left shoulder flexion AROM to at least 100 to improve ease of reaching    Baseline 70, 116 on 12/31/19    Status New      PT LONG TERM GOAL #3   Title Pt will be ind with final HEP    Status On-going                 Plan - 01/05/20 1355    Clinical Impression Statement Pt was able to add some resistance to supine and sidelying ROM but still struggles with active abduction causing  pain and limited scapular mobility.    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 Malignant neoplasm of tonsillar fossa ([place]) 06/01/2019  . 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 Head and neck cancer ([place]) 05/14/2018    Stark Bray 01/05/2020, 1:57 PM  [place], [place], [address] Phone: [phone]   Fax:  [phone]  Name: Albert Flowers MRN: [id number] Date of Birth: 04-16-1970

## 2020-01-05 NOTE — Patient Instructions (Signed)
  You appeared fine today with a hard swallow and smaller bite and sip sizes - no clearing your throat! I want you to continue to swallow as hard as you can with eating and drinking. Keep using small bites and sips.

## 2020-01-05 NOTE — Therapy (Addendum)
[place] [address] [place], [place], [address] Phone: [phone]   Fax:  [phone]  Speech Language Pathology Treatment  Patient Details  Name: Albert Flowers MRN: [id number] Date of Birth: [birth year] Referring Provider (SLP): Albert Gibson, MD   Encounter Date: 01/05/2020   End of Session - 01/05/20 1648    Visit Number 7    Number of Visits 9    Date for SLP Re-Evaluation 05/13/20    SLP Start Time 1533    SLP Stop Time  1601    SLP Time Calculation (min) 28 min    Activity Tolerance Patient tolerated treatment well           Past Medical History:  Diagnosis Date  . Diabetes mellitus without complication [facility])     Past Surgical History:  Procedure Laterality Date  . left neck dissection Left 05/04/2019   Left Neck Dissection by Dr. Nicolette Flowers at [facility].   . neck sugery     as a child "had a knot removed from neck" not sure which side.   . RIght neck dissection Right 06/16/2018   TORS and right neck dissection. Dr. Nicolette Flowers at [facility]    There were no vitals filed for this visit.   Subjective Assessment - 01/05/20 1538    Subjective "I think (swallowing) is a whole lot better."    Currently in Pain? No/denies                 ADULT SLP TREATMENT - 01/05/20 1538      General Information   Behavior/Cognition Alert;Cooperative;Pleasant mood      Treatment Provided   Treatment provided Dysphagia      Dysphagia Treatment   Temperature Spikes Noted No    Respiratory Status Room air    Oral Cavity - Dentition Adequate natural dentition    Treatment Methods Skilled observation;Therapeutic exercise;Patient/caregiver education    Patient observed directly with PO's Yes    Type of PO's observed Dysphagia 3 (soft);Thin liquids    Feeding Able to feed self    Liquids provided via Cup    Oral Phase Signs & Symptoms Prolonged mastication    Pharyngeal Phase Signs & Symptoms Other (comment)   no  overt s/sx aspiration!   Other treatment/comments Pt states he is completing HEP once/day regularly - SLP strongly reiterated BID completion, given pt's history. Procedure was completed with modified independence. Today with POs, pt c/o dryness however no overt s/sx aspiration, and given these trials today with bready item and water without pt following aspiration precautions from MBSS last month, SLP is comfortable with pt continuing with this practice. No overt s/sx aspiration PNA today.      Assessment / Recommendations / Plan   Plan --   pt change frequency to every other month     Dysphagia Recommendations   Diet recommendations --   as tolerated   Liquids provided via Cup    Medication Administration --   as tolerated   Compensations Effortful swallow;Small sips/bites;Slow rate      Progression Toward Goals   Progression toward goals Progressing toward goals            SLP Education - 01/05/20 1648    Education Details frequency of HEP, when to decr to x3/week (Feb 1st)    Person(s) Educated Patient    Methods Explanation    Comprehension Verbalized understanding            SLP Short Term Goals -  09/17/19 1501      SLP SHORT TERM GOAL #1   Title Pt will complete HEP with rare min A over two sessions    Baseline 07-23-19, 08-20-19    Status Achieved      SLP SHORT TERM GOAL #2   Title Pt will tell SLP rationale for HEP completion    Status Achieved      SLP SHORT TERM GOAL #3   Title Pt will tell SLP 3 overt s/s aspiration PNA with modified independence    Status Achieved            SLP Long Term Goals - 01/05/20 1552      SLP LONG TERM GOAL #1   Title Pt will complete HEP with modified independence over 5 visits    Baseline 07-23-19, 09-17-19, 12-03-19, 01-05-20    Time 2    Period --   sessions, for all LTGs   Status On-going      SLP LONG TERM GOAL #2   Title Pt will tell SLP when to decr frequency of HEP over two sessions    Baseline 01-05-20    Time 2     Status Revised      SLP LONG TERM GOAL #3   Title follow swallow precautions from MBSS on 12-03-19 with rare min A over two sessions    Time 2    Period --   sessions   Status Deferred            Plan - 01/05/20 1649    Clinical Impression Statement Today Albert Flowers's swallowing ability with dys III (cereal bar) and water appeared WFL/WFNL without any overt s/sx aspiration or pharyngeal difficulty - pt spontaneously using small bites adn sips and effortful swallow consistently with POs today - SLP told pt to cont with those compensations. No overt s/sx aspiration PNA reported or observed today. HEP was completed once/day and SLP strongly reiterated BID completion until 04-19-20. Data suggests that as pts progress through rad or chemorad therapy that their swallowing ability will decrease. Also, WNL swallowing is threatened by muscle fibrosis that will likely develop after rad/chemorad is completed. Therefore, SLP developed an indivdualized exercise program to mitigate/eliminate pt's muscle fibrosis following and as a result of, pt's rad tx. Skilled ST would cont to be beneficial to the pt in order to regularly assess pt's safety with POs and/or need for instrumental swallow assessment, as well as to assess accurate completion of swallowing HEP.    Speech Therapy Frequency --   once approx every 8 weeks   Duration --   6 follow up visits   Treatment/Interventions Aspiration precaution training;Pharyngeal strengthening exercises;Diet toleration management by SLP;Trials of upgraded texture/liquids;Internal/external aids;Patient/family education;SLP instruction and feedback;Compensatory techniques    Potential to Achieve Goals Good    SLP Home Exercise Plan provided today    Consulted and Agree with Plan of Care Patient           Patient will benefit from skilled therapeutic intervention in order to improve the following deficits and impairments:   Dysphagia, unspecified type - Plan: SLP plan of care  cert/re-cert    Problem List Patient Active Problem List   Diagnosis Date Noted  . Malignant neoplasm of tonsillar fossa ([place]) 06/01/2019  . Malignant neoplasm of base of tongue ([place]) 06/02/2018  . Head and neck cancer ([place]) 05/14/2018    Kenzleigh Sedam ,[place], Elaine  01/05/2020, 5:03 PM  [place] [address] [place],  [place], [address] Phone: [phone]   Fax:  [phone]   Name: Albert Flowers MRN: [id number] Date of Birth: 1970-11-20

## 2020-01-05 NOTE — Addendum Note (Signed)
Addended by: Garald Balding B on: 01/05/2020 05:05 PM   Modules accepted: Orders

## 2020-01-07 ENCOUNTER — Other Ambulatory Visit: Payer: Self-pay

## 2020-01-07 ENCOUNTER — Encounter: Payer: Self-pay | Admitting: Rehabilitation

## 2020-01-07 ENCOUNTER — Ambulatory Visit: Payer: Managed Care, Other (non HMO) | Admitting: Rehabilitation

## 2020-01-07 DIAGNOSIS — R293 Abnormal posture: Secondary | ICD-10-CM | POA: Diagnosis not present

## 2020-01-07 DIAGNOSIS — M6281 Muscle weakness (generalized): Secondary | ICD-10-CM

## 2020-01-07 DIAGNOSIS — R1310 Dysphagia, unspecified: Secondary | ICD-10-CM

## 2020-01-07 DIAGNOSIS — M25512 Pain in left shoulder: Secondary | ICD-10-CM

## 2020-01-07 NOTE — Therapy (Signed)
[place], [place], [address] Phone: [phone]   Fax:  [phone]  Physical Therapy Treatment  Patient Details  Name: Albert Flowers MRN: [id number] Date of Birth: 16-Feb-1971 Referring Provider (PT): Reita May Date: 01/07/2020   PT End of Session - 01/07/20 1343    Visit Number 9    Number of Visits 14    Date for PT Re-Evaluation 01/19/20    PT Start Time 1301    PT Stop Time 1341    PT Time Calculation (min) 40 min    Activity Tolerance Patient tolerated treatment well    Behavior During Therapy [facility] for tasks assessed/performed           Past Medical History:  Diagnosis Date  . Diabetes mellitus without complication [facility])     Past Surgical History:  Procedure Laterality Date  . left neck dissection Left 05/04/2019   [name]   . neck sugery     as a child "had a knot removed from neck" not sure which side.   . RIght neck dissection Right 06/16/2018   TORS and right neck dissection. Dr. [name] at [facility]    There were no vitals filed for this visit.   Subjective Assessment - 01/07/20 1301    Subjective Nothing new    Pertinent History L tonsillectomy and lymph nodes on Feb 15 , had diffiuculty raising left shoulder afterward . 06/06/2018 neck  dissection on right side for tonsil and base of tongue cancer, 06/06/19 removed the rest of the tonsil and more lymph nodes on the Lt with nerve damage. Radiation completed to the anterior neck completed.  Past hx of DM    Currently in Pain? No/denies              [facility] PT Assessment - 01/07/20 0001      AROM   Left Shoulder Flexion 125 Degrees    Left Shoulder ABduction 88 Degrees   pain                        [facility] Adult PT Treatment/Exercise - 01/07/20 0001      Shoulder Exercises: Supine   Flexion Left;10 reps    Shoulder Flexion Weight (lbs) 2    Diagonals Left;10  reps    Diagonals Weight (lbs) 2    Diagonals Limitations AAROM for PNF pattern end range support     Other Supine Exercises both hands at side at 45 deg of abduction pressing down into mat for isometrics back body exercise - verbal cues for deltoid and scapular activation    Other Supine Exercises supine bil tricep press 2# x 12      Shoulder Exercises: Sidelying   External Rotation Weight (lbs) 2    External Rotation Limitations with PT scapular stab 3x6    ABduction AAROM;Left    ABduction Limitations 2x6 with scapular rotation assist     Other Sidelying Exercises isometric scapular retraction/protraction, elevation/depression, ER, and abduction 6" x 6       Shoulder Exercises: Standing   Horizontal ABduction 5 reps;Right    Horizontal ABduction Limitations bent over reverse fly AAROm only. Pt now able to do this    Row Limitations bent over row 3# x 10      Manual Therapy   Scapular Mobilization in Rt sidelying: left scapular elevation, depression, retraction, protraction with isometric holds and manual resistance  Kinesiotix   Facilitate Muscle  tape still in place                       PT Long Term Goals - 01/07/20 1345      PT LONG TERM GOAL #1   Title Pt will improve left shoulder active abduction to at least 100 degrees to make reaching easier    Baseline 53, 53 on 12/31/19, 88 on 10/21    Status On-going      PT LONG TERM GOAL #2   Title Pt will improve left shoulder flexion AROM to at least 100 to improve ease of reaching    Baseline 70, 116 on 12/31/19, 125 on 01/07/20    Status Achieved      PT LONG TERM GOAL #3   Title Pt will be ind with final HEP    Status On-going                 Plan - 01/07/20 1344    Clinical Impression Statement Pt now able to move through exercises with less tcs and scapular assistance.  Pt demonstrating increased AROM today but still with pain on end range abduction/activation.           Patient will  benefit from skilled therapeutic intervention in order to improve the following deficits and impairments:     Visit Diagnosis: Dysphagia, unspecified type  Muscle weakness (generalized)  Acute pain of left shoulder  Abnormal posture     Problem List Patient Active Problem List   Diagnosis Date Noted  . Malignant neoplasm of tonsillar fossa ([place]) 06/01/2019  . Malignant neoplasm of base of tongue ([place]) 06/02/2018  . Head and neck cancer ([place]) 05/14/2018    Stark Bray 01/07/2020, 1:46 PM  [place], [place], [address] Phone: [phone]   Fax:  [phone]  Name: Caspian Deleonardis MRN: [id number] Date of Birth: 1970-11-20

## 2020-01-12 ENCOUNTER — Other Ambulatory Visit: Payer: Self-pay

## 2020-01-12 ENCOUNTER — Encounter: Payer: Self-pay | Admitting: Rehabilitation

## 2020-01-12 ENCOUNTER — Ambulatory Visit: Payer: Managed Care, Other (non HMO) | Admitting: Rehabilitation

## 2020-01-12 DIAGNOSIS — R293 Abnormal posture: Secondary | ICD-10-CM | POA: Diagnosis not present

## 2020-01-12 DIAGNOSIS — M25512 Pain in left shoulder: Secondary | ICD-10-CM

## 2020-01-12 DIAGNOSIS — M6281 Muscle weakness (generalized): Secondary | ICD-10-CM

## 2020-01-12 NOTE — Patient Instructions (Signed)
Kinesiotape   Any brand  Round the edges when you apply  Apply the tape at around 50% stretch and in your best posture possible!  Tape will stay on 3-4 days  Give your skin some breaks as needed

## 2020-01-12 NOTE — Therapy (Signed)
[place], [place], [address] Phone: [phone]   Fax:  [phone]  Physical Therapy Treatment  Patient Details  Name: Albert Flowers MRN: [id number] Date of Birth: 1971/01/20 Referring Provider (PT): Reita May Date: 01/12/2020   PT End of Session - 01/12/20 1350    Visit Number 10    Number of Visits 14    Date for PT Re-Evaluation 01/19/20    PT Start Time 1301    PT Stop Time 1572    PT Time Calculation (min) 48 min    Activity Tolerance Patient tolerated treatment well    Behavior During Therapy [facility] for tasks assessed/performed           Past Medical History:  Diagnosis Date   Diabetes mellitus without complication [facility])     Past Surgical History:  Procedure Laterality Date   left neck dissection Left 05/04/2019   Left Neck Dissection by Dr. [name] at [facility].    neck sugery     as a child "had a knot removed from neck" not sure which side.    RIght neck dissection Right 06/16/2018   TORS and right neck dissection. Dr. [name] at [facility]    There were no vitals filed for this visit.   Subjective Assessment - 01/12/20 1301    Subjective Nothing new today    Pertinent History L tonsillectomy and lymph nodes on Feb 15 , had diffiuculty raising left shoulder afterward . 06/06/2018 neck  dissection on right side for tonsil and base of tongue cancer, 06/06/19 removed the rest of the tonsil and more lymph nodes on the Lt with nerve damage. Radiation completed to the anterior neck completed.  Past hx of DM    Patient Stated Goals improve weakness and pain    Currently in Pain? No/denies                             [facility] Adult PT Treatment/Exercise - 01/12/20 0001      Shoulder Exercises: Supine   Protraction Both;20 reps    Protraction Weight (lbs) 3    Flexion Left;20 reps    ABduction Left;AAROM    Diagonals Left;10 reps    Diagonals Weight (lbs) 3     Diagonals Limitations AAROM for PNF pattern end range support     Other Supine Exercises both hands at side at 45 deg of abduction pressing down into mat for isometrics back body exercise - verbal cues for deltoid and scapular activation    Other Supine Exercises supine bil tricep press 3# x 12      Shoulder Exercises: Prone   Other Prone Exercises bil arms at sides lift 5" 2x5, attempted W or T arm lift but too painful      Manual Therapy   Scapular Mobilization in Rt sidelying: left scapular elevation, depression, retraction, protraction with isometric holds and manual resistance    Kinesiotex Facilitate Muscle      Kinesiotix   Facilitate Muscle  One Y strip for deltoid support and 1 I strip applied from anterior shoulder to mid back for postural support.  Gave pt handout on self taping along with instruction on where to get the tape as it seems to be beneficial                       PT Long Term Goals - 01/07/20 1345  PT LONG TERM GOAL #1   Title Pt will improve left shoulder active abduction to at least 100 degrees to make reaching easier    Baseline 53, 53 on 12/31/19, 88 on 10/21    Status On-going      PT LONG TERM GOAL #2   Title Pt will improve left shoulder flexion AROM to at least 100 to improve ease of reaching    Baseline 70, 116 on 12/31/19, 125 on 01/07/20    Status Achieved      PT LONG TERM GOAL #3   Title Pt will be ind with final HEP    Status On-going                 Plan - 01/12/20 1351    Clinical Impression Statement Able to increase weight for supine exercises and start to add more prone but still unable to do T or W type lift with too much pain.  Pt will decrease now to 1x per week with focus on HEP on next visit and more independent exercising .    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, review HEP,    PT Home Exercise Plan [id number],  [id number]    Consulted and Agree with Plan of Care Patient           Patient will benefit from skilled therapeutic intervention in order to improve the following deficits and impairments:     Visit Diagnosis: Muscle weakness (generalized)  Abnormal posture  Acute pain of left shoulder     Problem List Patient Active Problem List   Diagnosis Date Noted   Malignant neoplasm of tonsillar fossa ([place]) 06/01/2019   Malignant neoplasm of base of tongue ([place]) 06/02/2018   Head and neck cancer ([place]) 05/14/2018    Stark Bray 01/12/2020, 1:52 PM  [place], [place], [address] Phone: [phone]   Fax:  [phone]  Name: Albert Flowers MRN: [id number] Date of Birth: 05-10-1970

## 2020-01-14 ENCOUNTER — Other Ambulatory Visit: Payer: Self-pay

## 2020-01-14 ENCOUNTER — Ambulatory Visit: Payer: Managed Care, Other (non HMO) | Admitting: Rehabilitation

## 2020-01-14 ENCOUNTER — Encounter: Payer: Self-pay | Admitting: Rehabilitation

## 2020-01-14 DIAGNOSIS — M545 Low back pain, unspecified: Secondary | ICD-10-CM

## 2020-01-14 DIAGNOSIS — G8929 Other chronic pain: Secondary | ICD-10-CM

## 2020-01-14 DIAGNOSIS — M25512 Pain in left shoulder: Secondary | ICD-10-CM

## 2020-01-14 DIAGNOSIS — R293 Abnormal posture: Secondary | ICD-10-CM | POA: Diagnosis not present

## 2020-01-14 DIAGNOSIS — M6281 Muscle weakness (generalized): Secondary | ICD-10-CM

## 2020-01-14 NOTE — Therapy (Signed)
[name], [place], [address] Phone: [phone]   Fax:  [phone]  Physical Therapy Treatment  Patient Details  Name: Albert Flowers MRN: [id number] Date of Birth: 24-Mar-1970 Referring Provider (PT): Reita May Date: 01/14/2020   PT End of Session - 01/14/20 1355    Visit Number 11    Number of Visits 14    Date for PT Re-Evaluation 01/19/20    PT Start Time 1301    PT Stop Time 1354    PT Time Calculation (min) 53 min    Activity Tolerance Patient tolerated treatment well    Behavior During Therapy [facility] for tasks assessed/performed           Past Medical History:  Diagnosis Date  . Diabetes mellitus without complication [facility])     Past Surgical History:  Procedure Laterality Date  . left neck dissection Left 05/04/2019   Left Neck Dissection by Dr. [name] at [facility].   . neck sugery     as a child "had a knot removed from neck" not sure which side.   . RIght neck dissection Right 06/16/2018   TORS and right neck dissection. Dr. [name] at [facility]    There were no vitals filed for this visit.   Subjective Assessment - 01/14/20 1303    Subjective My back has been hurting a bit more lately.  when I'm standing towards the end of the day.  Goes away when I sit down    Pertinent History L tonsillectomy and lymph nodes on Feb 15 , had diffiuculty raising left shoulder afterward . 06/06/2018 neck  dissection on right side for tonsil and base of tongue cancer, 06/06/19 removed the rest of the tonsil and more lymph nodes on the Lt with nerve damage. Radiation completed to the anterior neck completed.  Past hx of DM    Pain Score 1     Pain Location Arm   and back   Pain Orientation Left    Pain Descriptors / Indicators Throbbing    Pain Type Chronic pain    Pain Onset More than a month ago    Pain Frequency Intermittent                             [facility] Adult PT  Treatment/Exercise - 01/14/20 0001      Exercises   Exercises Other Exercises    Other Exercises  worked on updated HEP x 34 minutes to include shoulder activation and low back stretches      Lumbar Exercises: Stretches   Passive Hamstring Stretch Right;Left;20 seconds    Single Knee to Chest Stretch Right;Left;2 reps;10 seconds    Lower Trunk Rotation 3 reps;10 seconds      Lumbar Exercises: Supine   Bridge 5 reps      Shoulder Exercises: Prone   Retraction Both    Retraction Limitations forearm push up plus 2x5      Shoulder Exercises: Sidelying   External Rotation Left    External Rotation Weight (lbs) 2    External Rotation Limitations with PT scapular stab 3x6    ABduction AAROM;Left      Manual Therapy   Scapular Mobilization in Rt sidelying: left scapular elevation, depression, retraction, protraction with isometric holds and manual resistance                       PT  Long Term Goals - 01/07/20 1345      PT LONG TERM GOAL #1   Title Pt will improve left shoulder active abduction to at least 100 degrees to make reaching easier    Baseline 53, 53 on 12/31/19, 88 on 10/21    Status On-going      PT LONG TERM GOAL #2   Title Pt will improve left shoulder flexion AROM to at least 100 to improve ease of reaching    Baseline 70, 116 on 12/31/19, 125 on 01/07/20    Status Achieved      PT LONG TERM GOAL #3   Title Pt will be ind with final HEP    Status On-going                 Plan - 01/14/20 1355    Clinical Impression Statement Focused on updating HEP as pt will be now spreading out visits.  Pt has good understanding of basic exercises and how to progress these.  Also added more low back stretches as pt is getting better with his shoulder independence and still is having low back pain.    PT Frequency 2x / week    PT Duration 6 weeks    PT Treatment/Interventions ADLs/Self Care Home Management;Therapeutic exercise;Patient/family  education;Passive range of motion;Taping    PT Next Visit Plan check AROM, how is back? update HEP or include more core ? 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 Malignant neoplasm of tonsillar fossa ([place]) 06/01/2019  . Malignant neoplasm of base of tongue ([place]) 06/02/2018  . Head and neck cancer ([place]) 05/14/2018    Albert Flowers 01/14/2020, 1:58 PM  [place], [place], [address] Phone: [phone]   Fax:  [phone]  Name: [name] MRN: [id number] Date of Birth: May 10, 1970

## 2020-01-14 NOTE — Patient Instructions (Signed)
Access Code: [email or web address]: [email or web address]: 10/28/2021Prepared by: Marcene Brawn TevisExercises  Supine Bilateral Shoulder Protraction - 1 x daily - 3 x weekly - 1-3 sets - 10 reps - no hold  Sidelying Shoulder ER with Towel and Dumbbell - 1 x daily - 3 x weekly - 1-3 sets - 10 reps - no hold  Sidelying Shoulder Abduction Full Range of Motion - 1 x daily - 7 x weekly - 1 sets - 10 reps - no hold  Prone Scapular Retraction and Row - 1 x daily - 3 x weekly - 1-3 sets - 10 reps - no hold  Wall Push Up with Plus - 1 x daily - 7 x weekly - 1-3 sets - 10 reps - no hold  Kneeling Plank on Forearms with Scapular Protraction Retraction AROM - 1 x daily - 7 x weekly - 1-3 sets - 10 reps - no hold  Isometric Shoulder Abduction - Arm Straight at Wall - 1 x daily - 7 x weekly - 1 sets - 5-10 reps - 5 second hold   Access Code: [email or web address]: [name]: 10/28/2021Prepared by: Marcene Brawn TevisExercises  Supine Lower Trunk Rotation - 1 x daily - 7 x weekly - 1 sets - 5 reps - 6 second hold  Supine Single Knee to Chest Stretch - 1 x daily - 7 x weekly - 1 sets - 3 reps - 15 second hold  Supine Hamstring Stretch with Strap - 1 x daily - 7 x weekly - 1 sets - 2 reps - 20-30 seconds hold  Supine Bridge - 1 x daily - 7 x weekly - 1 sets - 10 reps - no hold

## 2020-01-21 ENCOUNTER — Ambulatory Visit: Payer: Managed Care, Other (non HMO) | Attending: Radiation Oncology | Admitting: Rehabilitation

## 2020-01-21 ENCOUNTER — Other Ambulatory Visit: Payer: Self-pay

## 2020-01-21 ENCOUNTER — Encounter: Payer: Self-pay | Admitting: Rehabilitation

## 2020-01-21 DIAGNOSIS — M545 Low back pain, unspecified: Secondary | ICD-10-CM | POA: Insufficient documentation

## 2020-01-21 DIAGNOSIS — G8929 Other chronic pain: Secondary | ICD-10-CM

## 2020-01-21 DIAGNOSIS — R293 Abnormal posture: Secondary | ICD-10-CM | POA: Insufficient documentation

## 2020-01-21 DIAGNOSIS — M25512 Pain in left shoulder: Secondary | ICD-10-CM | POA: Diagnosis present

## 2020-01-21 DIAGNOSIS — M6281 Muscle weakness (generalized): Secondary | ICD-10-CM | POA: Diagnosis not present

## 2020-01-21 NOTE — Therapy (Signed)
[place], [place], [address] Phone: [phone]   Fax:  [phone]  Physical Therapy Treatment  Patient Details  Name: Albert Flowers MRN: [id number] Date of Birth: 1970-11-28 Referring Provider (PT): Reita May Date: 01/21/2020   PT End of Session - 01/21/20 0901    Visit Number 12    Number of Visits 18    Date for PT Re-Evaluation 03/01/20    PT Start Time 0803    PT Stop Time 0843    PT Time Calculation (min) 40 min    Activity Tolerance Patient tolerated treatment well    Behavior During Therapy [facility] for tasks assessed/performed           Past Medical History:  Diagnosis Date  . Diabetes mellitus without complication [facility])     Past Surgical History:  Procedure Laterality Date  . left neck dissection Left 05/04/2019   Left Neck Dissection by Dr. [name] at [facility].   . neck sugery     as a child "had a knot removed from neck" not sure which side.   . RIght neck dissection Right 06/16/2018   TORS and right neck dissection. Dr. [name] at [facility]    There were no vitals filed for this visit.   Subjective Assessment - 01/21/20 0801    Subjective Nothing new.  The back has been hurting the same over the weekend but the shoulder felt good    Pertinent History L tonsillectomy and lymph nodes on Feb 15 , had diffiuculty raising left shoulder afterward . 06/06/2018 neck  dissection on right side for tonsil and base of tongue cancer, 06/06/19 removed the rest of the tonsil and more lymph nodes on the Lt with nerve damage. Radiation completed to the anterior neck completed.  Past hx of DM    Patient Stated Goals improve weakness and pain    Currently in Pain? Yes    Pain Score 2     Pain Location Shoulder    Pain Orientation Left    Pain Descriptors / Indicators Throbbing    Pain Type Chronic pain    Pain Onset More than a month ago    Pain Frequency Intermittent               [facility] PT Assessment - 01/21/20 0001      AROM   AROM Assessment Site Lumbar    Left Shoulder Flexion 131 Degrees    Left Shoulder ABduction 60 Degrees   prior to treatment    Lumbar Flexion --   fingers to ankles   Lumbar Extension --   WNL   Lumbar - Right Side Bend --   WNL   Lumbar - Left Side Bend --   WNL   Lumbar - Right Rotation --   WNL   Lumbar - Left Rotation WNL                         [facility] Adult PT Treatment/Exercise - 01/21/20 0001      Lumbar Exercises: Supine   Bridge 10 reps   with bil shoulder press 3# alternating   Straight Leg Raise 5 reps    Straight Leg Raises Limitations bil and TrA focus    Other Supine Lumbar Exercises TrA with red band clam x 10      Lumbar Exercises: Prone   Other Prone Lumbar Exercises forearm plank 2x15"  Shoulder Exercises: Prone   Retraction Limitations forearm push up plus 2x5    Horizontal ABduction 1 Limitations attempted again in prone able to get around 30deg of active movement so switched to standing      Shoulder Exercises: Sidelying   External Rotation Left    External Rotation Weight (lbs) 2    External Rotation Limitations noPT stab needed; 2x10    Flexion AROM    Flexion Limitations 2x6    ABduction AROM    ABduction Limitations 2x6 with out AAROM today     Other Sidelying Exercises isometric scapular retraction/protraction, elevation/depression, ER, and abduction 6" x 6       Shoulder Exercises: Standing   Other Standing Exercises back against wall abduction as tolerated and then extension press 6"x6      Kinesiotix   Facilitate Muscle  took break from tape today due to skin redness but pt may apply at home in a day or 2                       PT Long Term Goals - 01/07/20 1345      PT LONG TERM GOAL #1   Title Pt will improve left shoulder active abduction to at least 100 degrees to make reaching easier    Baseline 53, 53 on 12/31/19, 88 on 10/21    Status On-going      PT  LONG TERM GOAL #2   Title Pt will improve left shoulder flexion AROM to at least 100 to improve ease of reaching    Baseline 70, 116 on 12/31/19, 125 on 01/07/20    Status Achieved      PT LONG TERM GOAL #3   Title Pt will be ind with final HEP    Status On-going                 Plan - 01/21/20 0901    Clinical Impression Statement Pt is making progress with AROM except for abduction today but taken in the early morning prior to MT.  Added more core activitiy combined with shoulder which was tolerated well.  No pain with lumbar AROM.    PT Frequency 1x / week    PT Duration 6 weeks    PT Treatment/Interventions ADLs/Self Care Home Management;Therapeutic exercise;Patient/family education;Passive range of motion;Taping    PT Next Visit Plan update HEP or include more core ? 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 Malignant neoplasm of tonsillar fossa ([place]) 06/01/2019  . Malignant neoplasm of base of tongue ([place]) 06/02/2018  . Head and neck cancer ([place]) 05/14/2018    Stark Bray 01/21/2020, 9:03 AM  [place], [place], [address] Phone: [phone]   Fax:  [phone]  Name: Albert Flowers MRN: [id number] Date of Birth: [birth year]

## 2020-01-26 ENCOUNTER — Ambulatory Visit: Payer: Managed Care, Other (non HMO) | Admitting: Rehabilitation

## 2020-01-28 ENCOUNTER — Encounter: Payer: Self-pay | Admitting: Rehabilitation

## 2020-01-28 ENCOUNTER — Ambulatory Visit: Payer: Managed Care, Other (non HMO) | Admitting: Rehabilitation

## 2020-01-28 ENCOUNTER — Other Ambulatory Visit: Payer: Self-pay

## 2020-01-28 DIAGNOSIS — M25512 Pain in left shoulder: Secondary | ICD-10-CM

## 2020-01-28 DIAGNOSIS — M6281 Muscle weakness (generalized): Secondary | ICD-10-CM

## 2020-01-28 DIAGNOSIS — G8929 Other chronic pain: Secondary | ICD-10-CM

## 2020-01-28 DIAGNOSIS — R293 Abnormal posture: Secondary | ICD-10-CM

## 2020-01-28 NOTE — Therapy (Signed)
[place], [place], [address] Phone: [phone]   Fax:  [phone]  Physical Therapy Treatment  Patient Details  Name: Albert Flowers MRN: [id number] Date of Birth: [birth year] Referring Provider (PT): Reita May Date: 01/28/2020   PT End of Session - 01/28/20 0851    Visit Number 13    Number of Visits 18    Date for PT Re-Evaluation 03/01/20    PT Start Time 0802    PT Stop Time 0850    PT Time Calculation (min) 48 min    Activity Tolerance Patient tolerated treatment well    Behavior During Therapy [facility] for tasks assessed/performed           Past Medical History:  Diagnosis Date   Diabetes mellitus without complication [facility])     Past Surgical History:  Procedure Laterality Date   left neck dissection Left 05/04/2019   Left Neck Dissection by Dr. [name] at [facility].    neck sugery     as a child "had a knot removed from neck" not sure which side.    RIght neck dissection Right 06/16/2018   TORS and right neck dissection. Dr. [name] at [facility]    There were no vitals filed for this visit.   Subjective Assessment - 01/28/20 0804    Subjective The back and the arm have been okay    Pertinent History L tonsillectomy and lymph nodes on Feb 15 , had diffiuculty raising left shoulder afterward . 06/06/2018 neck  dissection on right side for tonsil and base of tongue cancer, 06/06/19 removed the rest of the tonsil and more lymph nodes on the Lt with nerve damage. Radiation completed to the anterior neck completed.  Past hx of DM    Currently in Pain? No/denies                             [facility] Adult PT Treatment/Exercise - 01/28/20 0001      Lumbar Exercises: Supine   Ab Set 10 reps    AB Set Limitations 6" hold with cueing for completion    Clam 15 reps    Clam Limitations red band with TrA activation    Bent Knee Raise 10 reps    Bridge 10 reps    Bridge  Limitations with ball    Straight Leg Raise 5 reps    Straight Leg Raises Limitations bil and TrA focus    Other Supine Lumbar Exercises TrA with ball squeeze 6" x 10       Shoulder Exercises: Supine   Protraction 20 reps    Protraction Weight (lbs) 3    Protraction Limitations protraction/retraction    Other Supine Exercises attempted supine scap series again horizontal abduction and bil ER but still with pain     Other Supine Exercises supine bil tricep press 3# 2x10      Shoulder Exercises: Sidelying   External Rotation Left    External Rotation Weight (lbs) 2    External Rotation Limitations 3x15    ABduction AAROM    ABduction Limitations 2x6 AAROm to decrease pain today      Manual Therapy   Kinesiotex Facilitate Muscle      Kinesiotix   Facilitate Muscle  1 Y strip following deltoid and one I strip for scapular retraction  PT Long Term Goals - 01/07/20 1345      PT LONG TERM GOAL #1   Title Pt will improve left shoulder active abduction to at least 100 degrees to make reaching easier    Baseline 53, 53 on 12/31/19, 88 on 10/21    Status On-going      PT LONG TERM GOAL #2   Title Pt will improve left shoulder flexion AROM to at least 100 to improve ease of reaching    Baseline 70, 116 on 12/31/19, 125 on 01/07/20    Status Achieved      PT LONG TERM GOAL #3   Title Pt will be ind with final HEP    Status On-going                 Plan - 01/28/20 0852    Clinical Impression Statement Continued with more core work today.  Pt is overall having less shoulder pain but still has evident muscular atrophy in the Left shoulder complex and difficultycompleting Abduction AROM wtihout pain and with decreased AROM.  Pt has one more scheduled visit.  More visits?    PT Frequency 1x / week    PT Duration 6 weeks    PT Treatment/Interventions ADLs/Self Care Home Management;Therapeutic exercise;Patient/family education;Passive range of  motion;Taping    PT Next Visit Plan update HEP or include more visits? 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, review HEP,    PT Home Exercise Plan [email or web address] for back,  [email or web address] for shoulder    Consulted and Agree with Plan of Care Patient           Patient will benefit from skilled therapeutic intervention in order to improve the following deficits and impairments:     Visit Diagnosis: Muscle weakness (generalized)  Abnormal posture  Acute pain of left shoulder  Chronic midline low back pain without sciatica     Problem List Patient Active Problem List   Diagnosis Date Noted   Malignant neoplasm of tonsillar fossa ([place]) 06/01/2019   Malignant neoplasm of base of tongue ([place]) 06/02/2018   Head and neck cancer ([place]) 05/14/2018    Stark Bray 01/28/2020, 8:54 AM  [place], [place], [address] Phone: [phone]   Fax:  [phone]  Name: [name] MRN: [id number] Date of Birth: Sep 18, 1970

## 2020-02-02 ENCOUNTER — Ambulatory Visit: Payer: Managed Care, Other (non HMO) | Admitting: Rehabilitation

## 2020-02-04 ENCOUNTER — Encounter: Payer: Self-pay | Admitting: Rehabilitation

## 2020-02-04 ENCOUNTER — Ambulatory Visit: Payer: Managed Care, Other (non HMO) | Admitting: Rehabilitation

## 2020-02-04 ENCOUNTER — Other Ambulatory Visit: Payer: Self-pay

## 2020-02-04 DIAGNOSIS — G8929 Other chronic pain: Secondary | ICD-10-CM

## 2020-02-04 DIAGNOSIS — M545 Low back pain, unspecified: Secondary | ICD-10-CM

## 2020-02-04 DIAGNOSIS — M25512 Pain in left shoulder: Secondary | ICD-10-CM

## 2020-02-04 DIAGNOSIS — R293 Abnormal posture: Secondary | ICD-10-CM

## 2020-02-04 DIAGNOSIS — M6281 Muscle weakness (generalized): Secondary | ICD-10-CM | POA: Diagnosis not present

## 2020-02-04 NOTE — Therapy (Signed)
[place], [place], [address] Phone: [phone]   Fax:  [phone]  Physical Therapy Treatment  Patient Details  Name: Albert Flowers MRN: [id number] Date of Birth: 08/19/1970 Referring Provider (PT): Reita May Date: 02/04/2020   PT End of Session - 02/04/20 1600    Visit Number 14    Number of Visits 18    Date for PT Re-Evaluation 03/01/20    PT Start Time 1504    PT Stop Time 1544    PT Time Calculation (min) 40 min    Activity Tolerance Patient tolerated treatment well    Behavior During Therapy [facility] for tasks assessed/performed           Past Medical History:  Diagnosis Date  . Diabetes mellitus without complication [facility])     Past Surgical History:  Procedure Laterality Date  . left neck dissection Left 05/04/2019   Left Neck Dissection by Dr. [name] at [facility].   . neck sugery     as a child "had a knot removed from neck" not sure which side.   . RIght neck dissection Right 06/16/2018   TORS and right neck dissection. Dr. [name] at [facility]    There were no vitals filed for this visit.   Subjective Assessment - 02/04/20 1501    Subjective all the same but I feel ready to be done    Pertinent History L tonsillectomy and lymph nodes on Feb 15 , had diffiuculty raising left shoulder afterward . 06/06/2018 neck  dissection on right side for tonsil and base of tongue cancer, 06/06/19 removed the rest of the tonsil and more lymph nodes on the Lt with nerve damage. Radiation completed to the anterior neck completed.  Past hx of DM    Currently in Pain? No/denies              [facility] PT Assessment - 02/04/20 0001      AROM   Left Shoulder Flexion 145 Degrees   pn   Left Shoulder ABduction 150 Degrees   pn                        [facility] Adult PT Treatment/Exercise - 02/04/20 0001      Lumbar Exercises: Stretches   Passive Hamstring Stretch Right;Left;2 reps;30  seconds    Single Knee to Chest Stretch Right;Left;2 reps;20 seconds    Lower Trunk Rotation 3 reps;10 seconds      Lumbar Exercises: Supine   Bridge 10 reps      Lumbar Exercises: Sidelying   Clam Both;10 reps    Clam Limitations red band      Shoulder Exercises: Supine   Protraction 20 reps    Protraction Weight (lbs) 3    Protraction Limitations protraction/retraction      Shoulder Exercises: Prone   Retraction Limitations forearm push up plus 2x5    Horizontal ABduction 1 Limitations able to get 60deg of horizontal abduction       Shoulder Exercises: Sidelying   External Rotation Left    External Rotation Weight (lbs) 0    External Rotation Limitations x15    ABduction AROM;10 reps                       PT Long Term Goals - 02/04/20 1540      PT LONG TERM GOAL #1   Title Pt will improve left shoulder active abduction  to at least 100 degrees to make reaching easier    Baseline 53, 53 on 12/31/19, 88 on 10/21, 150 on 02/04/20    Status Achieved      PT LONG TERM GOAL #2   Title Pt will improve left shoulder flexion AROM to at least 100 to improve ease of reaching    Baseline 70, 116 on 12/31/19, 125 on 01/07/20, 145 on 02/04/20    Status Achieved      PT LONG TERM GOAL #3   Title Pt will be ind with final HEP    Status Achieved                 Plan - 02/04/20 1600    Clinical Impression Statement Pt is ready for DC after today's visit with ind with HEP to work on core and shoulder/scapular strengthening.  Pt was able to make good progress with his shoulder AROM improving from 60deg to 150 deg with some substitution present but able to perform over head reaching now.  All goals met           Patient will benefit from skilled therapeutic intervention in order to improve the following deficits and impairments:     Visit Diagnosis: Muscle weakness (generalized)  Abnormal posture  Acute pain of left shoulder  Chronic midline low back pain  without sciatica     Problem List Patient Active Problem List   Diagnosis Date Noted  . Malignant neoplasm of tonsillar fossa ([place]) 06/01/2019  . Malignant neoplasm of base of tongue ([place]) 06/02/2018  . Head and neck cancer ([place]) 05/14/2018    Stark Bray 02/04/2020, 4:02 PM  [place], [place], [address] Phone: [phone]   Fax:  [phone]  Name: Albert Flowers MRN: [id number] Date of Birth: March 20, 1970  PHYSICAL THERAPY DISCHARGE SUMMARY  Visits from Start of Care: 14  Current functional level related to goals / functional outcomes: See above   Remaining deficits: See above   Education / Equipment: Final HEP Plan: Patient agrees to discharge.  Patient goals were met. Patient is being discharged due to meeting the stated rehab goals.  ?????    Shan Levans, PT

## 2020-02-08 ENCOUNTER — Telehealth: Payer: Self-pay | Admitting: *Deleted

## 2020-02-08 NOTE — Telephone Encounter (Signed)
Called patient to inform of CT for 02-17-20 - arrival  time -4:15 pm @ [facility], no restrictions to the test, Dr. Isidore Moos to call patient with results, spoke with patient  and he is aware of these appts.

## 2020-02-10 NOTE — Progress Notes (Signed)
Oncology Nurse Navigator Documentation  I spoke with Mr. Hixon today regarding his 02/19/20 follow up appointment with Dr. Isidore Moos. I informed him that this would be at telephone call to receive results from his 02/17/20 CT scan. He understands that Dr. Isidore Moos will call him. He knows to call me if he has any further questions or concerns.  Harlow Asa RN, BSN, OCN Head & Neck Oncology Nurse [place] at [facility] Phone # [phone]  Fax # [phone]

## 2020-02-16 ENCOUNTER — Ambulatory Visit: Payer: Managed Care, Other (non HMO) | Admitting: Rehabilitation

## 2020-02-17 ENCOUNTER — Ambulatory Visit (HOSPITAL_COMMUNITY)
Admission: RE | Admit: 2020-02-17 | Discharge: 2020-02-17 | Disposition: A | Discharge status: Home / Self Care | Payer: Managed Care, Other (non HMO) | Source: Ambulatory Visit | Attending: Radiation Oncology | Admitting: Radiation Oncology

## 2020-02-17 ENCOUNTER — Other Ambulatory Visit: Payer: Self-pay

## 2020-02-17 DIAGNOSIS — C01 Malignant neoplasm of base of tongue: Secondary | ICD-10-CM | POA: Insufficient documentation

## 2020-02-17 IMAGING — CT CT CHEST W/O CM
2 of 3 series · 15 of 36 positions shown, 18 images · non-contrast
Comparison: CT [DATE]

CLINICAL DATA: Follow-up lower lobe ground-glass nodularity.
Patient head neck carcinoma.

EXAM:
CT CHEST WITHOUT CONTRAST
TECHNIQUE: Multidetector CT imaging of the chest was performed following the
standard protocol without IV contrast.

[Series 2: thorax · axial · 0.74mm/px · z∈[-262,+32]mm · 12 of 173 slices shown, 15 images]
[im 13/173  mediastinal]
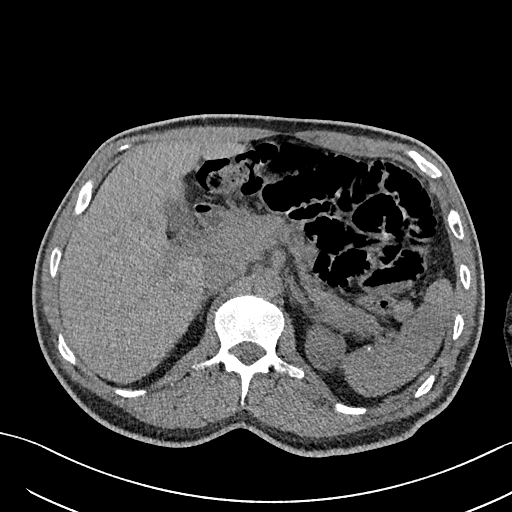
[im 13/173  lung]
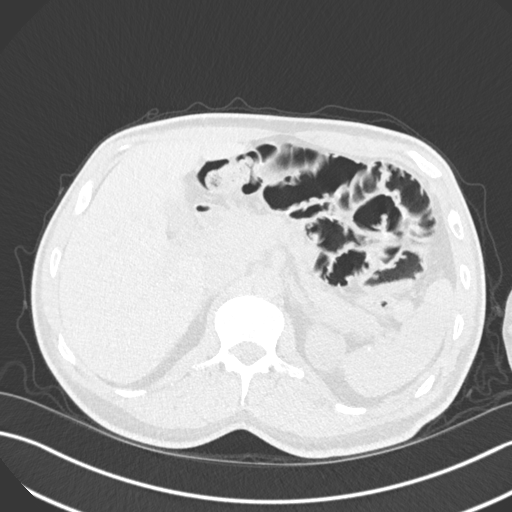
[im 26/173  lung]
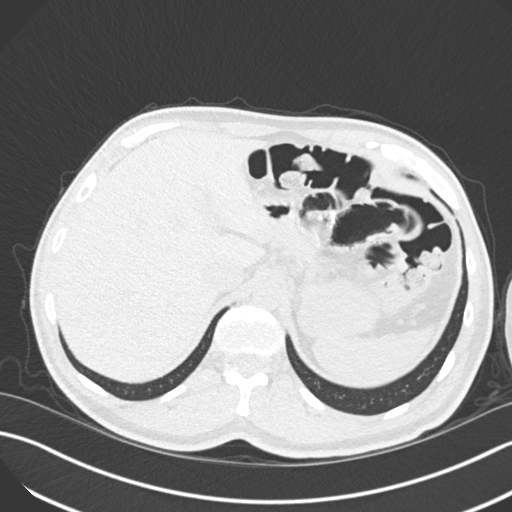
[im 39/173  lung]
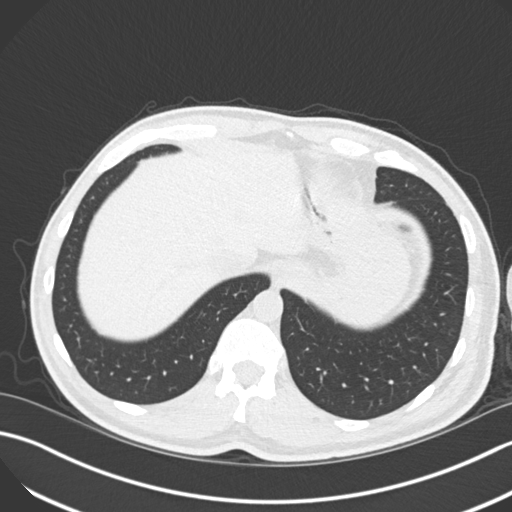
[im 51/173  lung]
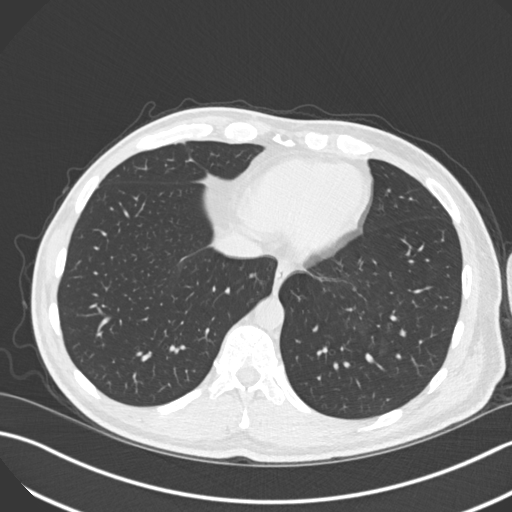
[im 64/173  mediastinal]
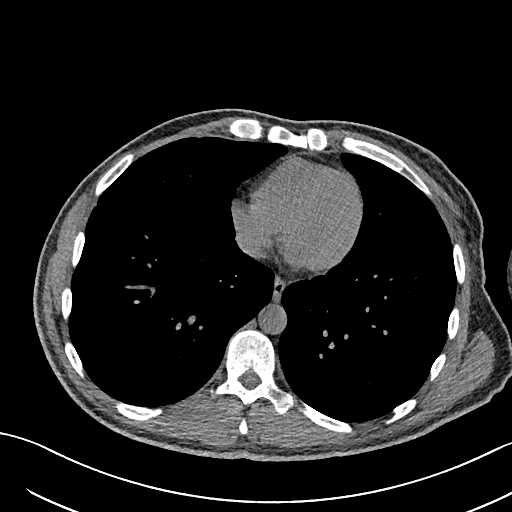
[im 64/173  lung]
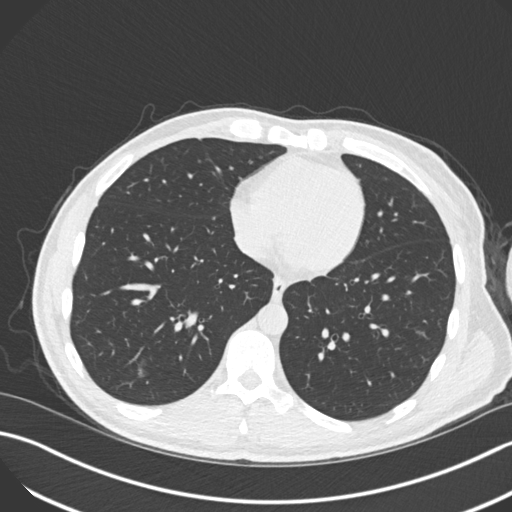
[im 77/173  lung]
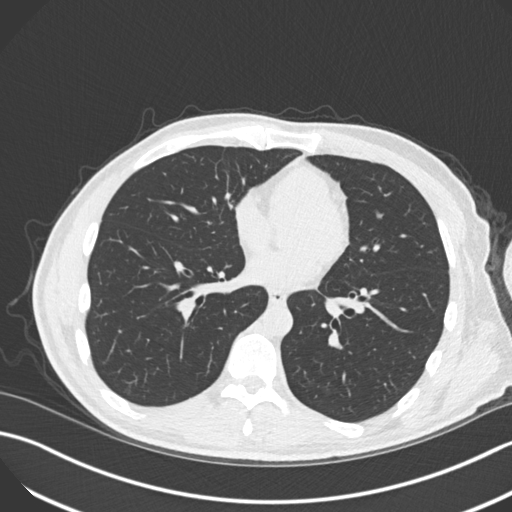
[im 96/173  lung]
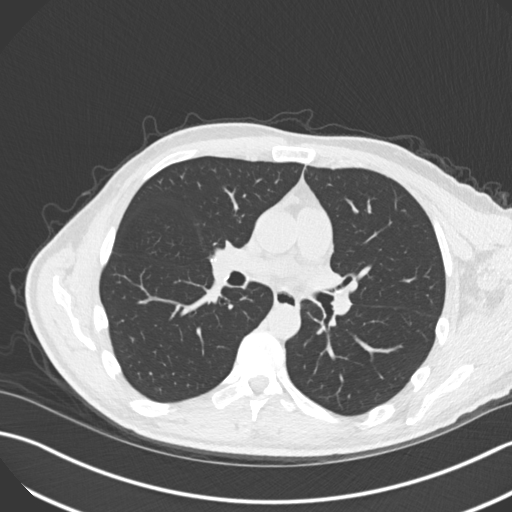
[im 109/173  lung]
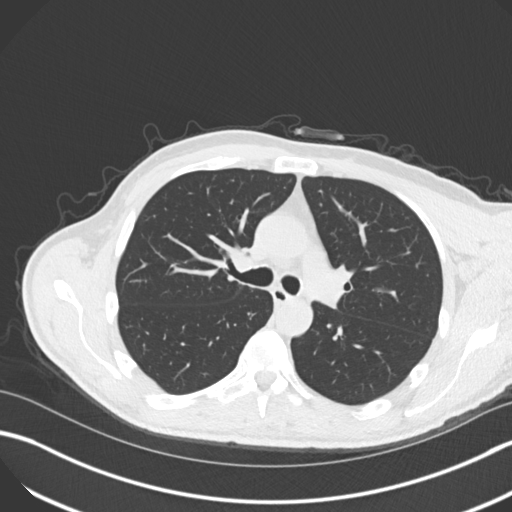
[im 122/173  mediastinal]
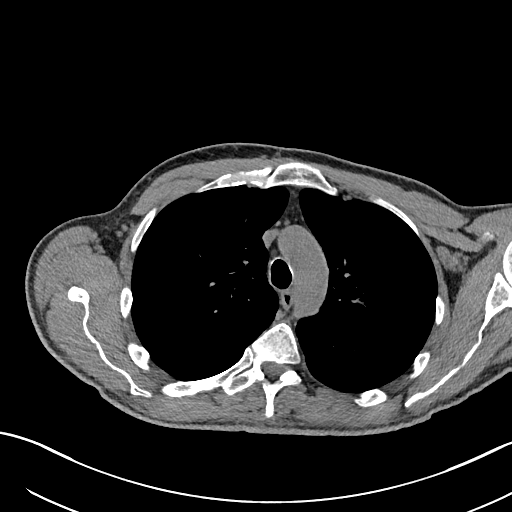
[im 122/173  lung]
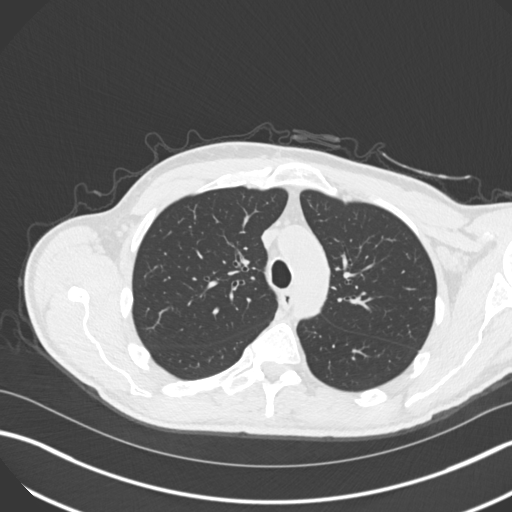
[im 134/173  lung]
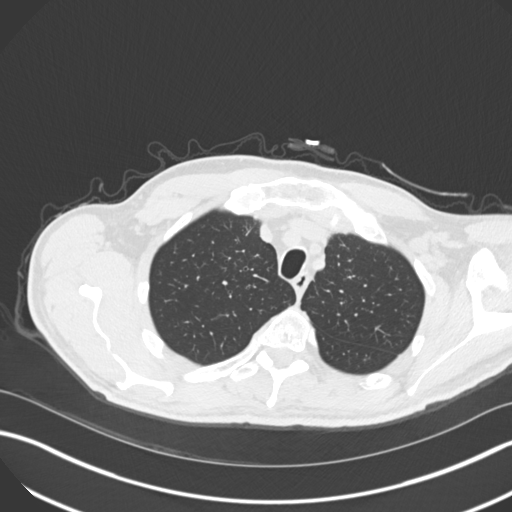
[im 147/173  lung]
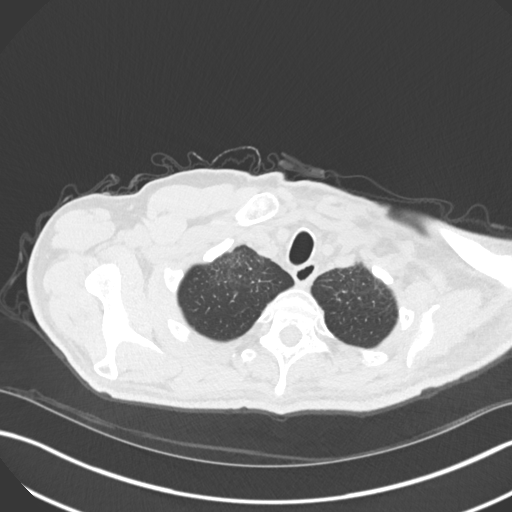
[im 160/173  lung]
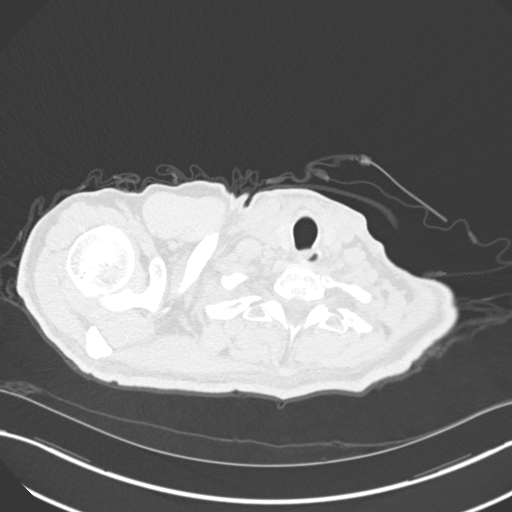

[Series 6: coronal · coronal · 0.69mm/px · 3 of 129 slices shown]
[im 26/129  lung]
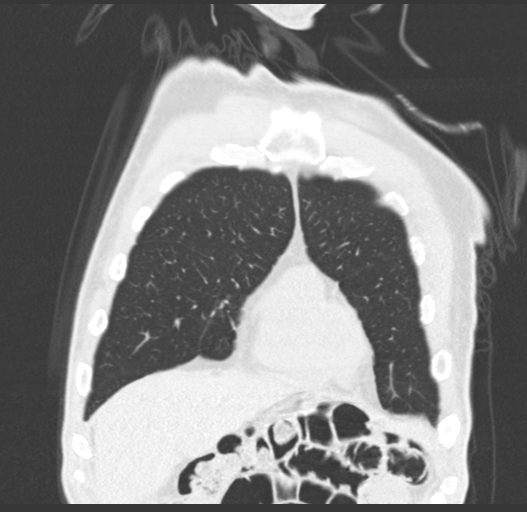
[im 52/129  lung]
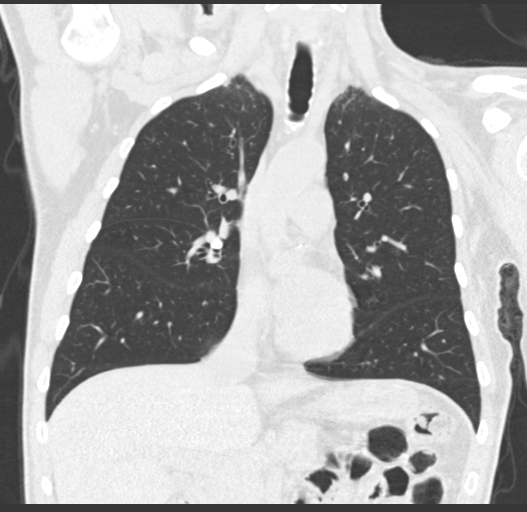
[im 77/129  lung]
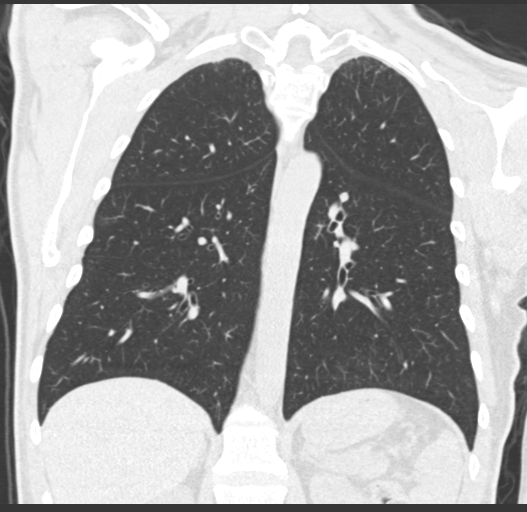

[15 of 36 positions shown; findings below may reference images not displayed]

FINDINGS: Cardiovascular: No significant vascular findings. Normal heart size.
No pericardial effusion.

Mediastinum/Nodes: No axillary or supraclavicular adenopathy. No
mediastinal or hilar adenopathy. No pericardial fluid. Esophagus
normal.

Lungs/Pleura: Interval near complete resolution of the ground-glass
nodularity within LEFT lower lobe and RIGHT lower lobe most
consistent resolving infectious or inflammatory process.

There is a subtle new nodule in LEFT lower lobe measuring 3-4 mm
(image 116/series 5).

Upper Abdomen: Limited view of the liver, kidneys, pancreas are
unremarkable. Normal adrenal glands.

Musculoskeletal: No aggressive osseous lesion.
IMPRESSION: 1. Near complete resolution of ground-glass nodule in the lower
lobes consistent resolving infectious or inflammatory process.
2. Single small new pulmonary nodule in the LEFT lower lobe.
Recommend follow-up CT in 3 to 6 months.

## 2020-02-19 ENCOUNTER — Other Ambulatory Visit: Payer: Self-pay

## 2020-02-19 ENCOUNTER — Encounter: Payer: Self-pay | Admitting: Radiation Oncology

## 2020-02-19 ENCOUNTER — Ambulatory Visit
Admission: RE | Admit: 2020-02-19 | Discharge: 2020-02-19 | Disposition: A | Discharge status: Home / Self Care | Payer: Managed Care, Other (non HMO) | Source: Ambulatory Visit | Attending: Radiation Oncology | Admitting: Radiation Oncology

## 2020-02-19 DIAGNOSIS — C090 Malignant neoplasm of tonsillar fossa: Secondary | ICD-10-CM

## 2020-02-19 DIAGNOSIS — C760 Malignant neoplasm of head, face and neck: Secondary | ICD-10-CM

## 2020-02-19 DIAGNOSIS — C01 Malignant neoplasm of base of tongue: Secondary | ICD-10-CM

## 2020-02-19 NOTE — Progress Notes (Signed)
Radiation Oncology         ([phone]) [phone] ________________________________  Name: Albert Flowers MRN: [id number]  Date: 02/19/2020  DOB: [birth year]  Follow-Up Visit Note by telephone.  The patient opted for telemedicine to maximize safety during the pandemic.  MyChart video was not obtainable.   CC: [name], MD  [name], MD  Diagnosis and Prior Radiotherapy:       ICD-10-CM   1. Malignant neoplasm of base of tongue ([place])  C01    Radiation Treatment Dates: 06/15/2019 through 07/24/2019   Site Technique Total Dose (Gy) Dose per Fx (Gy) Completed Fx Beam Energies  Tonsil, Right: HN_Rt_tonsil_bed IMRT 60/60 2 30/30 6X    CHIEF COMPLAINT:  CT results  Narrative:   I spoke with the patient by phone today to give him the results to his CT scan.  I personally reviewed his images.    He is doing well with no new issues.  The groundglass nodularity in the left lower lobe and right lower lobe have nearly completely resolved and are consistent with inflammatory or infectious processes.  He has a subtle new nodule in the left lower lobe that measures 3 to 4 mm.  Follow-up in 3 to 6 months recommended.                ALLERGIES:  is allergic to penicillins.  Meds: Current Outpatient Medications  Medication Sig Dispense Refill  . acetaminophen (TYLENOL) 500 MG tablet Take 500 mg by mouth every 6 (six) hours as needed.    Marland Kitchen aspirin EC 81 MG tablet Take 81 mg by mouth daily.    . celecoxib (CELEBREX) 100 MG capsule Take 100 mg by mouth 2 (two) times daily.    Marland Kitchen lidocaine (XYLOCAINE) 2 % solution Patient: Mix 1part 2% viscous lidocaine, 1part H20. Swish & swallow 30mL of diluted mixture, 32min before meals and at bedtime, up to QID 200 mL 4  . LORazepam (ATIVAN) 0.5 MG tablet 0.5 mg SL q 6 hours prn nausea. Do not drive after taking this medication. 30 tablet 0  . metFORMIN (GLUCOPHAGE) 1000 MG tablet Take 1,000 mg by mouth 2 (two) times daily with a meal.     . naproxen (NAPROSYN) 500  MG tablet Take 500 mg by mouth 2 (two) times daily as needed.    . nystatin (MYCOSTATIN) 100000 UNIT/ML suspension Take 5 mLs (500,000 Units total) by mouth 4 (four) times daily. Swish and spit or swallow until better. 240 mL 0  . ondansetron (ZOFRAN ODT) 8 MG disintegrating tablet Take 1 tablet (8 mg total) by mouth every 8 (eight) hours as needed for nausea or vomiting. 20 tablet 2  . oxyCODONE (ROXICODONE) 5 MG/5ML solution 5 to 10 mg PO q 4 hours prn oral and throat pain 240 mL 0  . rosuvastatin (CRESTOR) 20 MG tablet     . sodium fluoride (PREVIDENT 5000 PLUS) 1.1 % CREA dental cream Patient to use as directed on a toothbrush or alternatively in his fluoride trays as instructed.  Patient is to spit out excess and not swallow.  Repeat nightly. 2 Tube prn  . sucralfate (CARAFATE) 1 g tablet Dissolve 1 tablet in 10 mL H20 and swallow 20 min prior to meals and bedtime. 40 tablet 3   No current facility-administered medications for this encounter.    Physical Findings: The patient is in no acute distress. Patient is alert and oriented. Wt Readings from Last 3 Encounters:  11/13/19 150 lb (68  kg)  11/05/19 150 lb 12.8 oz (68.4 kg)  08/07/19 171 lb 9.6 oz (77.8 kg)    vitals were not taken for this visit. .  General: Alert and oriented, in no acute distress.    Lab Findings: Lab Results  Component Value Date   WBC 6.1 07/29/2019   HGB 14.2 07/29/2019   HCT 42.5 07/29/2019   MCV 92.8 07/29/2019   PLT 289 07/29/2019    Lab Results  Component Value Date   TSH 1.621 06/04/2018    Radiographic Findings: I have personally reviewed his imaging CT Chest Wo Contrast  Result Date: 02/18/2020 CLINICAL DATA:  Follow-up lower lobe ground-glass nodularity. Patient head neck carcinoma. EXAM: CT CHEST WITHOUT CONTRAST TECHNIQUE: Multidetector CT imaging of the chest was performed following the standard protocol without IV contrast. COMPARISON:  CT 11/13/2019 FINDINGS: Cardiovascular: No  significant vascular findings. Normal heart size. No pericardial effusion. Mediastinum/Nodes: No axillary or supraclavicular adenopathy. No mediastinal or hilar adenopathy. No pericardial fluid. Esophagus normal. Lungs/Pleura: Interval near complete resolution of the ground-glass nodularity within LEFT lower lobe and RIGHT lower lobe most consistent resolving infectious or inflammatory process. There is a subtle new nodule in LEFT lower lobe measuring 3-4 mm (image 116/series 5). Upper Abdomen: Limited view of the liver, kidneys, pancreas are unremarkable. Normal adrenal glands. Musculoskeletal: No aggressive osseous lesion. IMPRESSION: 1. Near complete resolution of ground-glass nodule in the lower lobes consistent resolving infectious or inflammatory process. 2. Single small new pulmonary nodule in the LEFT lower lobe. Recommend follow-up CT in 3 to 6 months. Electronically Signed   By: Suzy Bouchard M.D.   On: 02/18/2020 10:46    Impression/Plan: I recommend a follow-up scan in 3 to 6 months to follow-up a nonspecific tiny lung nodule.  He would like to do this in April.  We will arrange that and I will call him with the results at that time.  He is also scheduled for an in person follow-up with me in January.  I look forward to seeing him in about a month.  This encounter was provided by telemedicine platform; patient desired telemedicine during pandemic precautions.  Telephone call was used as Administrator was not available. The patient has given verbal consent for this type of encounter and has been advised to only accept a meeting of this type in a secure network environment. On date of service, in total, I spent 25 minutes on this encounter.   The attendants for this meeting include [name]  and Sherolyn Buba During the encounter, [name] was located at [facility] Radiation Oncology Department.  Max Nuno was located at home.       _____________________________________   [name], MD

## 2020-03-01 ENCOUNTER — Ambulatory Visit: Payer: Managed Care, Other (non HMO) | Attending: Radiation Oncology

## 2020-03-31 ENCOUNTER — Telehealth: Payer: Self-pay | Admitting: *Deleted

## 2020-03-31 NOTE — Telephone Encounter (Signed)
CALLED PATIENT TO REMIND OF LAB AND FU FOR 04-01-20, SPOKE WITH PATIENT AND HE IS AWARE OF THESE APPTS.

## 2020-04-01 ENCOUNTER — Ambulatory Visit
Admission: RE | Admit: 2020-04-01 | Discharge: 2020-04-01 | Disposition: A | Discharge status: Home / Self Care | Payer: Managed Care, Other (non HMO) | Source: Ambulatory Visit | Attending: Radiation Oncology | Admitting: Radiation Oncology

## 2020-04-01 ENCOUNTER — Other Ambulatory Visit: Payer: Self-pay

## 2020-04-01 VITALS — BP 123/85 | HR 79 | Temp 97.5°F | Resp 16 | Ht 70.0 in | Wt 153.8 lb

## 2020-04-01 DIAGNOSIS — M545 Low back pain, unspecified: Secondary | ICD-10-CM

## 2020-04-01 DIAGNOSIS — C01 Malignant neoplasm of base of tongue: Secondary | ICD-10-CM

## 2020-04-01 DIAGNOSIS — Z7982 Long term (current) use of aspirin: Secondary | ICD-10-CM | POA: Insufficient documentation

## 2020-04-01 DIAGNOSIS — R682 Dry mouth, unspecified: Secondary | ICD-10-CM | POA: Insufficient documentation

## 2020-04-01 DIAGNOSIS — Z7984 Long term (current) use of oral hypoglycemic drugs: Secondary | ICD-10-CM | POA: Insufficient documentation

## 2020-04-01 DIAGNOSIS — Z1329 Encounter for screening for other suspected endocrine disorder: Secondary | ICD-10-CM | POA: Insufficient documentation

## 2020-04-01 DIAGNOSIS — Z79899 Other long term (current) drug therapy: Secondary | ICD-10-CM | POA: Insufficient documentation

## 2020-04-01 DIAGNOSIS — C760 Malignant neoplasm of head, face and neck: Secondary | ICD-10-CM

## 2020-04-01 DIAGNOSIS — Z85819 Personal history of malignant neoplasm of unspecified site of lip, oral cavity, and pharynx: Secondary | ICD-10-CM | POA: Insufficient documentation

## 2020-04-01 DIAGNOSIS — G8929 Other chronic pain: Secondary | ICD-10-CM

## 2020-04-01 NOTE — Progress Notes (Signed)
Albert Flowers presents today for follow-up after completing radiation to the right tonsil on 07/24/2019  Pain issues, if any: Denies any throat pain, but does report lingering back pain that has now progressed to his left leg. Reports it can be difficult to maneuver out of the car. Managing with ibuprofen  Using a feeding tube?: N/A Weight changes, if any: Wt Readings from Last 3 Encounters:  04/01/20 153 lb 12.8 oz (69.8 kg)  11/13/19 150 lb (68 kg)  11/05/19 150 lb 12.8 oz (68.4 kg)   Swallowing issues, if any: Swallowing has much improved. He is slowly retrying a variety of foods to see what he can tolerate. Denies any issues with thin liquids.  Smoking or chewing tobacco? None Using fluoride trays daily? No--sees his dentist regularly Last ENT visit was on: 12/30/2019 Saw Dr. [name]: "--He has done well since the last visit in July  --Remains with dry mouth, improving. Lost about 40-50 lb during therapy, has been stable.  --He denies pain but mild sore throat  --He is currently swallowing most types of foods (burgers, pot pie) --No hemoptysis  --No new palpable neck masses or enlarged lymph nodes  --Some left shoulder pain/weakness but ROM seems OK --There is no evidence of disease on exam today.  --He sees Dr Isidore Moos in January . I'll see him back in March  --He is agreeable with this plan. All his questions were answered."  Other notable issues, if any: Patient denies any ear or jaw pain, or difficulty opening his mouth fully. Denies any issues with lymphedema to the neck. Continues to deal with dry mouth, thick saliva, and altered taste. Denies any issues with sinus drainage or congestion. Has started using an OTC sleep aide to help him sleep at night. Reports he's not taking any of his usual daily prescriptions, but plans to follow-up with his PCP to address soon  Vitals:   04/01/20 1433  BP: 123/85  Pulse: 79  Resp: 16  Temp: (!) 97.5 F (36.4 C)  SpO2: 99%

## 2020-04-01 NOTE — Progress Notes (Signed)
Radiation Oncology         [phone] ________________________________  Name: Albert Flowers MRN: [id number]  Date: 04/01/2020  DOB: [birth year]  Follow-Up Visit Note in person Outpatient  CC: Albert Shanks, MD  Albert Ames, MD  Diagnosis and Prior Radiotherapy:       ICD-10-CM   1. 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 Malignant neoplasm of base of tongue ([place])  C01    Radiation Treatment Dates: 06/15/2019 through 07/24/2019   Site Technique Total Dose (Gy) Dose per Fx (Gy) Completed Fx Beam Energies  Tonsil, Right: HN_Rt_tonsil_bed IMRT 60/60 2 30/30 6X    CHIEF COMPLAINT:  CT results  Narrative:     Albert Flowers presents today for follow-up after completing radiation to the right tonsil on 07/24/2019  Pain issues, if any: Denies any throat pain, but does report lingering back pain that has now progressed to radiating around his upper left leg. Reports it can be difficult to maneuver out of the car. Managing with ibuprofen  Using a feeding tube?: N/A Weight changes, if any: Wt Readings from Last 3 Encounters:  04/01/20 153 lb 12.8 oz (69.8 kg)  11/13/19 150 lb (68 kg)  11/05/19 150 lb 12.8 oz (68.4 kg)   Swallowing issues, if any: Swallowing has much improved. He is slowly retrying a variety of foods to see what he can tolerate. Denies any issues with thin liquids.  Smoking or chewing tobacco? None Using fluoride trays daily? No--sees his dentist regularly Last ENT visit was on: 12/30/2019 Saw Albert Flowers: "--He has done well since the last visit in July  --Remains with dry mouth, improving. Lost about 40-50 lb during therapy, has been stable.  --He denies pain but mild sore throat  --He is currently swallowing most types of foods (burgers, pot pie) --No hemoptysis  --No new palpable neck masses or enlarged lymph nodes  --Some left shoulder pain/weakness but ROM seems OK --There is no evidence of disease on exam today.  --He sees Albert Flowers in January .  I'll see him back in March  --He is agreeable with this plan. All his questions were answered."  Other notable issues, if any: Patient denies any ear or jaw pain, or difficulty opening his mouth fully. Denies any issues with lymphedema to the neck. Continues to deal with dry mouth, thick saliva, and altered taste. Denies any issues with sinus drainage or congestion. Has started using an OTC sleep aide to help him sleep at night. Reports he's not taking any of his usual daily prescriptions, but plans to follow-up with his PCP to address soon  Vitals:   04/01/20 1433  BP: 123/85  Pulse: 79  Resp: 16  Temp: (!) 97.5 F (36.4 C)  SpO2: 99%     ALLERGIES:  is allergic to penicillins.  Meds: Current Outpatient Medications  Medication Sig Dispense Refill  . acetaminophen (TYLENOL) 500 MG tablet Take 500 mg by mouth every 6 (six) hours as needed. (Patient not taking: Reported on 04/01/2020)    . aspirin EC 81 MG tablet Take 81 mg by mouth daily. (Patient not taking: Reported on 04/01/2020)    . celecoxib (CELEBREX) 100 MG capsule Take 100 mg by mouth 2 (two) times daily. (Patient not taking: Reported on 04/01/2020)    . metFORMIN (GLUCOPHAGE) 1000 MG tablet Take 1,000 mg by mouth 2 (two) times daily with a meal.  (Patient not taking: Reported on 04/01/2020)    . naproxen (NAPROSYN) 500  MG tablet Take 500 mg by mouth 2 (two) times daily as needed. (Patient not taking: Reported on 04/01/2020)    . nystatin (MYCOSTATIN) 100000 UNIT/ML suspension Take 5 mLs (500,000 Units total) by mouth 4 (four) times daily. Swish and spit or swallow until better. (Patient not taking: Reported on 04/01/2020) 240 mL 0  . rosuvastatin (CRESTOR) 20 MG tablet  (Patient not taking: Reported on 04/01/2020)    . sodium fluoride (PREVIDENT 5000 PLUS) 1.1 % CREA dental cream Patient to use as directed on a toothbrush or alternatively in his fluoride trays as instructed.  Patient is to spit out excess and not swallow.  Repeat  nightly. (Patient not taking: Reported on 04/01/2020) 2 Tube prn  . sucralfate (CARAFATE) 1 g tablet Dissolve 1 tablet in 10 mL H20 and swallow 20 min prior to meals and bedtime. (Patient not taking: Reported on 04/01/2020) 40 tablet 3   No current facility-administered medications for this encounter.    Physical Findings: The patient is in no acute distress. Patient is alert and oriented. Wt Readings from Last 3 Encounters:  04/01/20 153 lb 12.8 oz (69.8 kg)  11/13/19 150 lb (68 kg)  11/05/19 150 lb 12.8 oz (68.4 kg)    height is 5\' 10"  (1.778 m) and weight is 153 lb 12.8 oz (69.8 kg). His temperature is 97.5 F (36.4 C) (abnormal). His blood pressure is 123/85 and his pulse is 79. His respiration is 16 and oxygen saturation is 99%. .  General: Alert and oriented, in no acute distress. HEENT: No lesions in the mouth or oropharynx, tongue is midline NECK: No palpable lymphadenopathy in the cervical or supraclavicular regions CHEST: Clear to auscultation bilaterally Heart: Regular rate and rhythm   Lab Findings: Lab Results  Component Value Date   WBC 6.1 07/29/2019   HGB 14.2 07/29/2019   HCT 42.5 07/29/2019   MCV 92.8 07/29/2019   PLT 289 07/29/2019    Lab Results  Component Value Date   TSH 1.382 04/01/2020    Radiographic Findings:   No results found.  Impression/Plan:   Head and neck cancer status: No evidence of disease  Thyroid function: Continue to check annually, within normal limits today  Nutrition: Weight has stabilized though it is decreased from his prior baseline before diagnosis  Dry mouth: Continue sips of water, Biotene products, XyliMelts recommended over-the-counter  Chronic back pain, new sciatic pain: We are making a referral to the neurosurgical clinic.  We have placed a comment that if they prefer an MRI before the consultation, we are happy to order that.  Infectious disease prevention: We discussed measures to reduce the risk of infection  during the COVID-19 pandemic.  He is due for his booster shot.  I strongly recommended that he pursue this.  At this time he declines the booster shot.  Follow-up plans: April CT Chest (I will call w/ results)  Head and neck survivorship clinic in late May  F/u with me in August in person, continue close follow-up with otolaryngology as scheduled.  On date of service, in total, I spent 30 minutes on this encounter. Patient was seen in person.    _____________________________________   [name], MD

## 2020-04-04 LAB — TSH: TSH: 1.382 u[IU]/mL (ref 0.320–4.118)

## 2020-04-05 ENCOUNTER — Encounter: Payer: Self-pay | Admitting: Radiation Oncology

## 2020-04-06 ENCOUNTER — Telehealth: Payer: Self-pay

## 2020-04-06 ENCOUNTER — Telehealth: Payer: Self-pay | Admitting: *Deleted

## 2020-04-06 NOTE — Telephone Encounter (Signed)
-----   Message from [name], MD sent at 04/04/2020  2:15 PM EST ----- Please let him know tsh is WNL  thanks ----- Message ----- From: Interface, Lab In [place] Sent: 04/04/2020  11:27 AM EST To: [name], MD

## 2020-04-06 NOTE — Telephone Encounter (Signed)
Left VM on both patient's home phone and cell phone with update that thyroid function was normal per last TSH. Informed him we are still working on getting CT chest scan scheduled in April (with a telephone F/U with Dr. Isidore Moos the day after to review) and an appointment with Alfredia Client in [facility]. Provided patient with direct call back number should he have any further questions/concerns

## 2020-04-06 NOTE — Telephone Encounter (Signed)
CALLED PATIENT TO INFORM OF APPT. WITH DR. POOL ON 04-07-20 @ 1:30 PM ([facility]), SPOKE WITH PATIENT AND HE IS AWARE OF THIS APPT.

## 2020-04-11 ENCOUNTER — Telehealth: Payer: Self-pay | Admitting: Medical

## 2020-04-11 NOTE — Telephone Encounter (Signed)
Scheduled appt per 1/20 sch msg - mailed letter with appt date and time

## 2020-04-21 ENCOUNTER — Telehealth: Payer: Self-pay

## 2020-04-21 NOTE — Telephone Encounter (Signed)
Received VM from patient stating he had left Dr. Rodman Pickle office with [place], and was told he had cancer in his thoracic spine. He was advised to reach back out to Dr. Isidore Moos to see if radiation would be a viable treatment option.   Shortly after, received a call from Dr. Irven [place] office relaying the same information. Was informed that patient had an MRI done at their office, and it would be available on [facility]/PACS. Staff member stated she would fax over Dr. Irven Baltimore office note as soon as he finished dictating it.   High priority in-basket sent to Dr. Isidore Moos and admin team with above information.   Returned patient's call to let him know that someone from our office would be back in touch once Dr. Isidore Moos was back in the office, and given direction on how quickly she felt patient needed to be seen. Patient verbalized understanding and expressed appreciation of call. Encouraged patient to call me back should he have any other questions/concerns before we reached out again.

## 2020-04-22 ENCOUNTER — Other Ambulatory Visit: Payer: Self-pay | Admitting: Radiation Therapy

## 2020-04-22 ENCOUNTER — Other Ambulatory Visit: Payer: Self-pay | Admitting: Radiation Oncology

## 2020-04-22 DIAGNOSIS — C7951 Secondary malignant neoplasm of bone: Secondary | ICD-10-CM

## 2020-04-22 DIAGNOSIS — C01 Malignant neoplasm of base of tongue: Secondary | ICD-10-CM

## 2020-04-22 MED ORDER — GABAPENTIN 300 MG PO CAPS: 300.0000 mg | ORAL_CAPSULE | Freq: Three times a day (TID) | ORAL | 0 refills | Status: DC

## 2020-04-22 NOTE — Addendum Note (Signed)
Addended by: Pincus Large on: 04/22/2020 03:34 PM   Modules accepted: Orders

## 2020-04-22 NOTE — Progress Notes (Signed)
Total spine orders entered for [facility] also if the patient's insurance denies coverage at [facility] facility. If the scans are done at GI, the orders have to be entered as 3 separate studies.   Mont Dutton R.T.(R)(T) Radiation Special Procedures Navigator

## 2020-04-22 NOTE — Addendum Note (Signed)
Addended by: Pincus Large on: 04/22/2020 03:40 PM   Modules accepted: Orders

## 2020-04-22 NOTE — Progress Notes (Signed)
I called the patient to discuss his MRI results and consultation with neurosurgery. I reviewed his images and documentation. The MRI of his thoracic spine is concerning. He understands that there is a high suspicion for metastatic disease. I do not see any metastatic disease in the thoracic spine from his December CT scan, however. I do think that he needs some imaging to restage his whole body and get a better understanding of the degree of abnormality in the spine. We will arrange a PET scan, restaging, from skull base to mid thigh ASAP. We will also obtain an MRI screening protocol of the entire spine with and without contrast. He understands we may need to perform some biopsies. We talked about different possible treatment algorithms if he in fact has metastatic disease. Right now he has pain that ranges from 3-10, mainly in his lower back. I prescribed gabapentin for him today to try. He also has insomnia and I recommended he try Benadryl as needed.  He was wife expressed appreciation for the call. They understand that I will contact them as the results come in.  -----------------------------------  Albert Gibson, MD

## 2020-04-25 ENCOUNTER — Inpatient Hospital Stay: Payer: Managed Care, Other (non HMO)

## 2020-04-25 NOTE — Addendum Note (Signed)
Addended by: Pincus Large on: 04/25/2020 12:02 PM   Modules accepted: Orders

## 2020-04-26 ENCOUNTER — Ambulatory Visit
Admission: RE | Admit: 2020-04-26 | Discharge: 2020-04-26 | Disposition: A | Discharge status: Home / Self Care | Payer: Managed Care, Other (non HMO) | Source: Ambulatory Visit | Attending: Radiation Oncology | Admitting: Radiation Oncology

## 2020-04-26 DIAGNOSIS — C7951 Secondary malignant neoplasm of bone: Secondary | ICD-10-CM

## 2020-04-26 IMAGING — MR MR CERVICAL SPINE WO/W CM
5 of 8 series · 29 of 48 positions shown · IV contrast (multihance)
Comparison: None.

CLINICAL DATA: Metastatic head neck cancer.

EXAM:
MRI CERVICAL SPINE WITHOUT AND WITH CONTRAST
TECHNIQUE: Multiplanar and multiecho pulse sequences of the cervical spine, to
include the craniocervical junction and cervicothoracic junction,
were obtained without and with intravenous contrast.
CONTRAST:  14mL MULTIHANCE GADOBENATE DIMEGLUMINE 529 MG/ML IV SOLN

[Series 16: T2 · sagittal · 3.0mm · 0.55mm/px · 4 of 17 slices shown (1 of 2)]
[im 1/17]
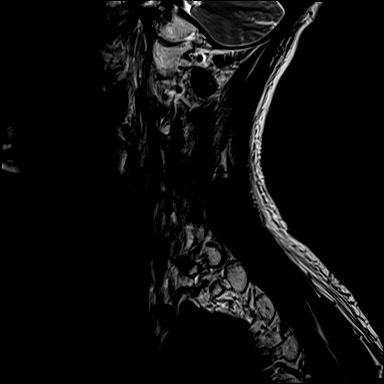
[im 6/17]
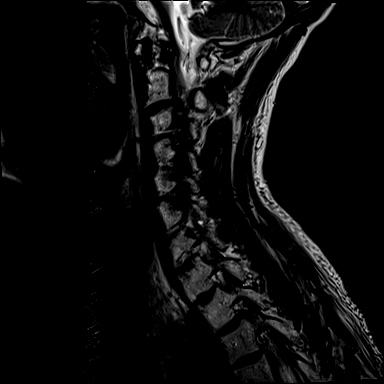
[im 11/17]
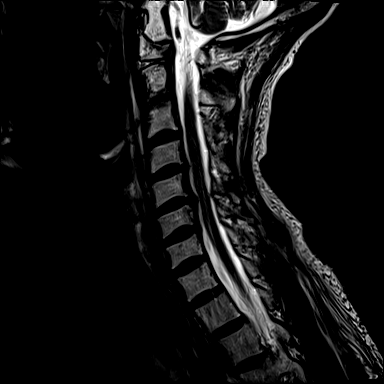
[im 17/17]
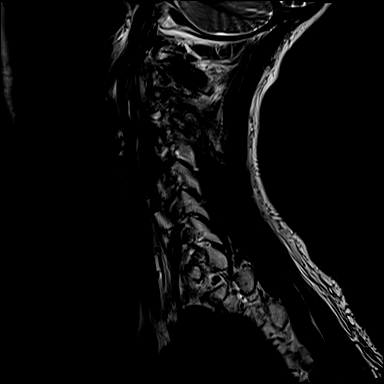

[Series 17: T1 · sagittal · 3.0mm · 0.66mm/px · 4 of 15 slices shown (1 of 3)]
[im 1/15]
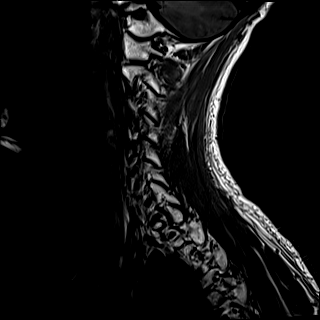
[im 5/15]
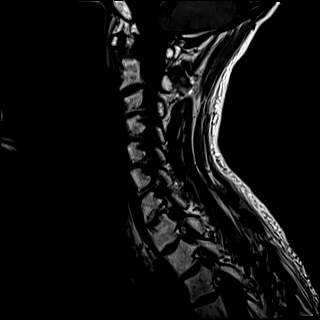
[im 10/15]
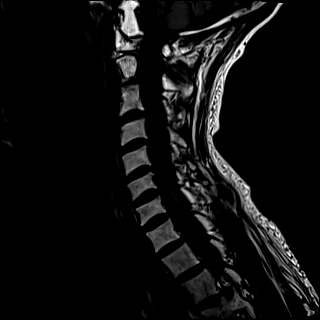
[im 15/15]
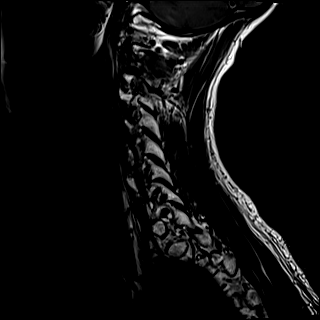

[Series 21: T2 · axial · 3.0mm · 0.62mm/px · z∈[-39,+65]mm · 8 of 33 slices shown (2 of 2)]
[im 1/33]
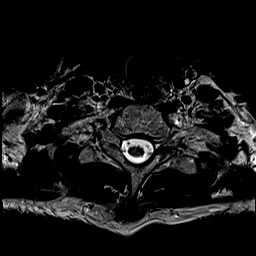
[im 5/33]
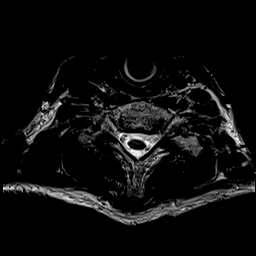
[im 10/33]
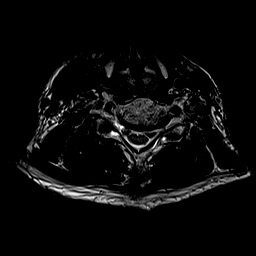
[im 14/33]
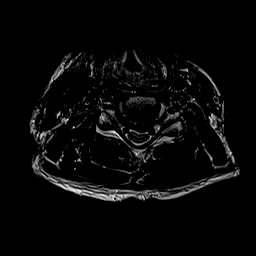
[im 19/33]
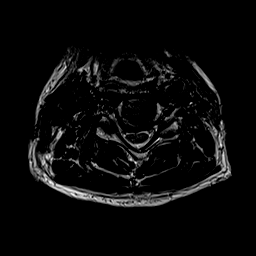
[im 23/33]
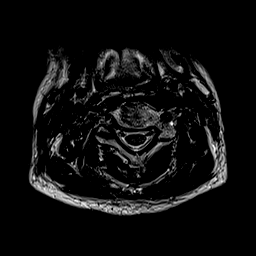
[im 28/33]
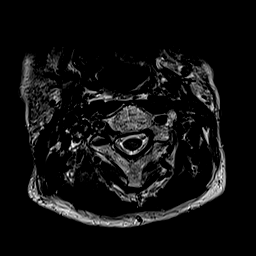
[im 33/33]
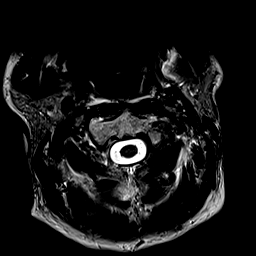

[Series 22: T1 · axial · non-contrast · 3.0mm · 0.31mm/px · z∈[-37,+63]mm · 8 of 32 slices shown (2 of 3)]
[im 1/32]
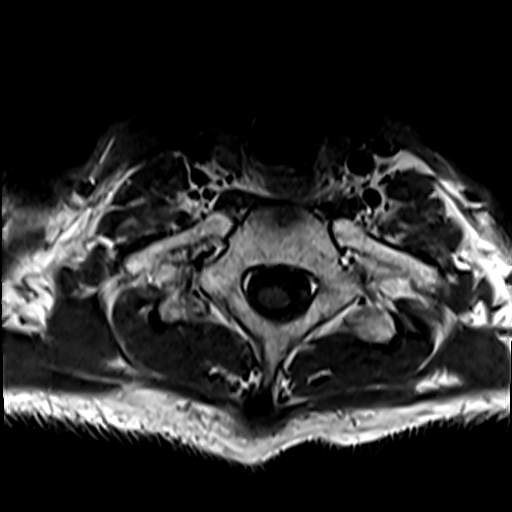
[im 5/32]
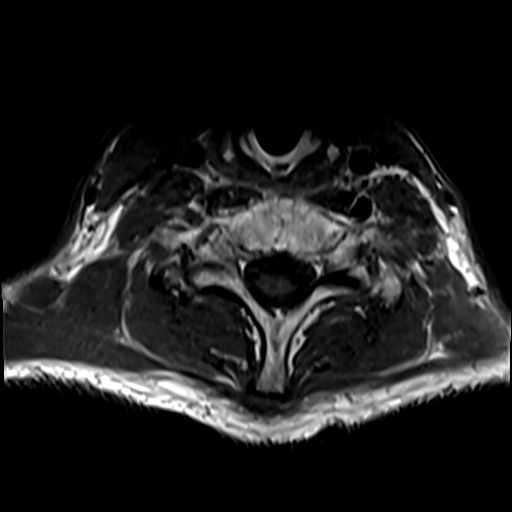
[im 9/32]
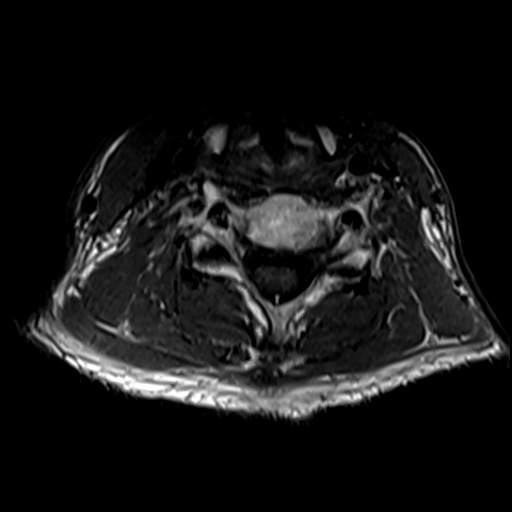
[im 14/32]
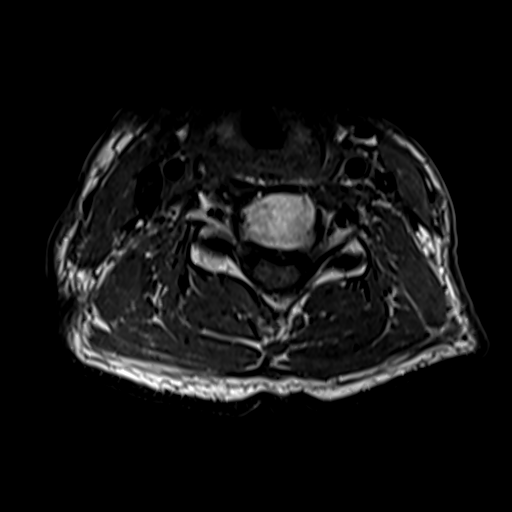
[im 18/32]
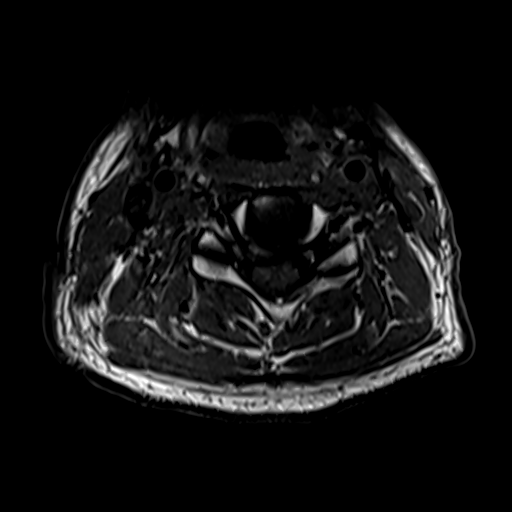
[im 23/32]
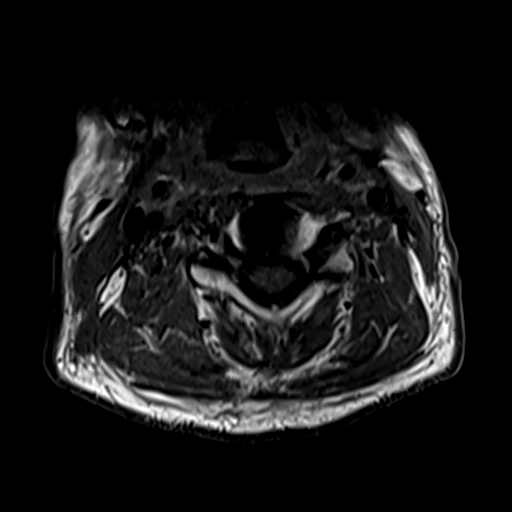
[im 27/32]
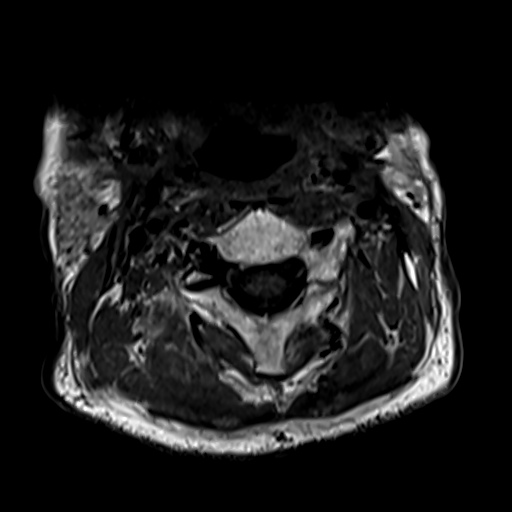
[im 32/32]
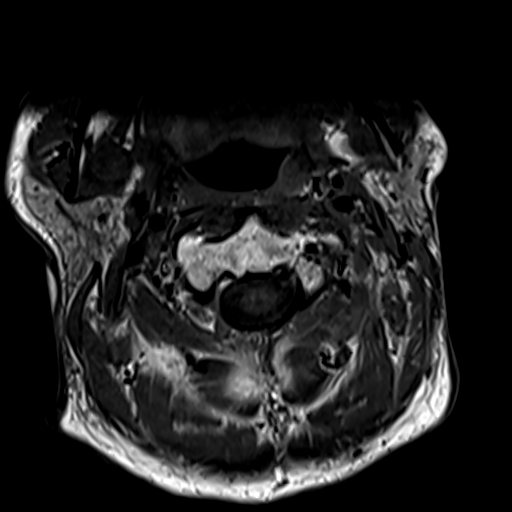

[Series 28: T1 · axial · 3.0mm · 0.31mm/px · z∈[-34,+18]mm · 5 of 30 slices shown (3 of 3)]
[im 1/30]
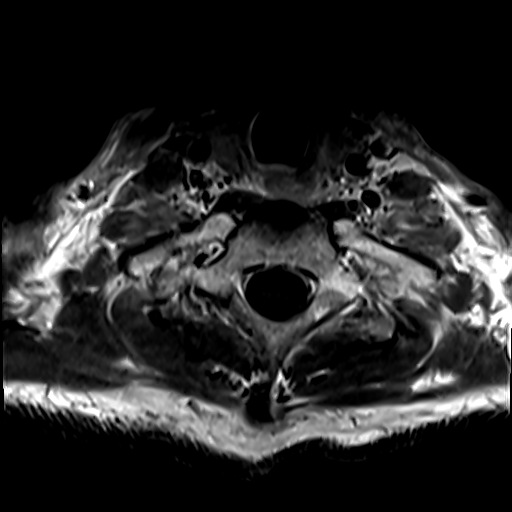
[im 5/30]
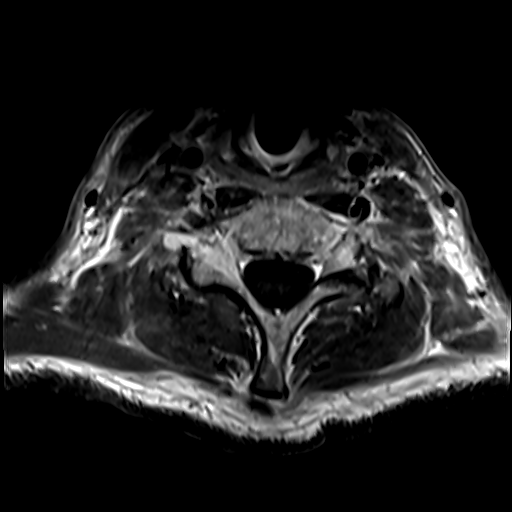
[im 9/30]
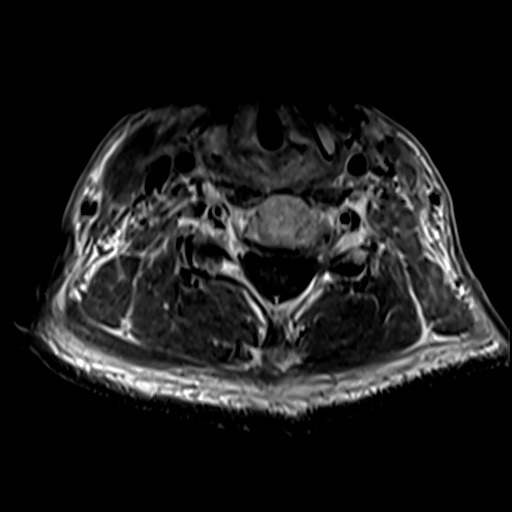
[im 13/30]
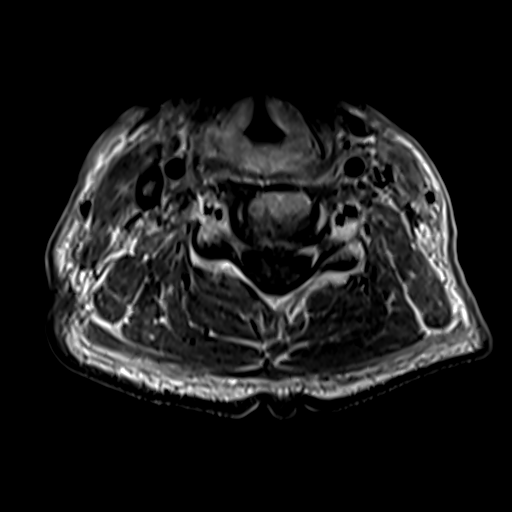
[im 17/30]
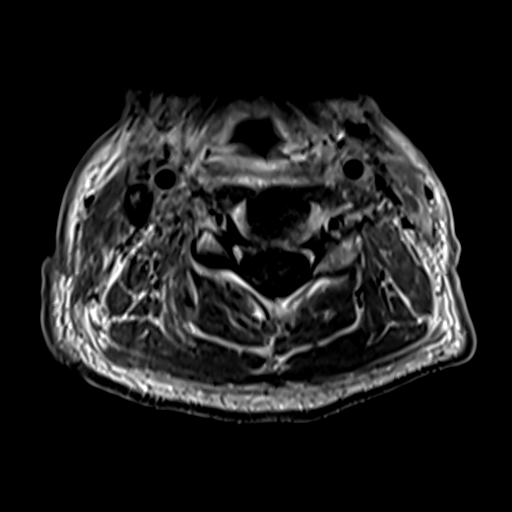

[29 of 48 positions shown; findings below may reference images not displayed]

FINDINGS: Alignment: Mild retrolisthesis C4-5.

Vertebrae: Diffuse fatty bone marrow may represent radiation change.
No metastatic disease in the cervical spine. No fracture.

Cord: Normal signal and morphology.

Posterior Fossa, vertebral arteries, paraspinal tissues: Negative

Disc levels:

C2-3: Right foraminal disc protrusion with right foraminal stenosis

C3-4: Left foraminal narrowing due to spurring.

C4-5: Central disc protrusion and associated spurring right greater
than left. Moderate right foraminal stenosis. Cord flattening and
mild spinal stenosis right greater than left. Mild left foraminal
narrowing.

C5-6: Central disc protrusion with cord flattening and mild spinal
stenosis. Neural foramina patent bilaterally.

C6-7: Shallow central disc protrusion.  Negative for stenosis

C7-T1: Negative
IMPRESSION: Negative for metastatic disease cervical spine

Multilevel degenerative change as detailed above.

## 2020-04-26 IMAGING — MR MR THORACIC SPINE WO/W CM
8 of 11 series · 36 of 48 positions shown · IV contrast (multihance)
Comparison: Thoracic MRI [DATE]

CLINICAL DATA: Head neck cancer with known metastatic disease.

EXAM:
MRI THORACIC WITHOUT AND WITH CONTRAST
TECHNIQUE: Multiplanar and multiecho pulse sequences of the thoracic spine were
obtained without and with intravenous contrast.
CONTRAST:  14mL MULTIHANCE GADOBENATE DIMEGLUMINE 529 MG/ML IV SOLN

[Series 13: t1_tse_sag count · sagittal · 3.0mm · 1.06mm/px · 2 of 13 slices shown]
[im 1/13]
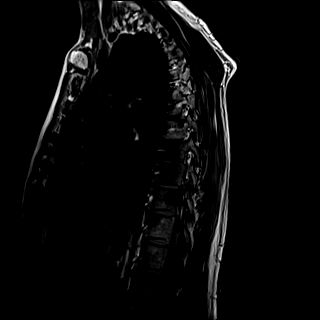
[im 7/13]
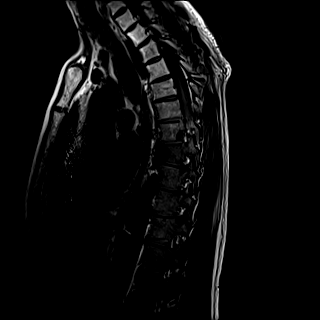

[Series 14: T1 · sagittal · 3.0mm · 0.83mm/px · 4 of 18 slices shown (1 of 2)]
[im 1/18]
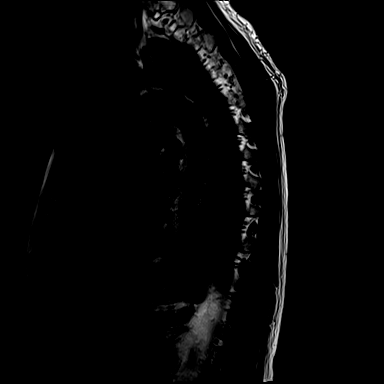
[im 6/18]
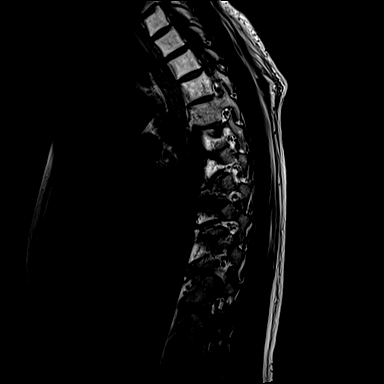
[im 12/18]
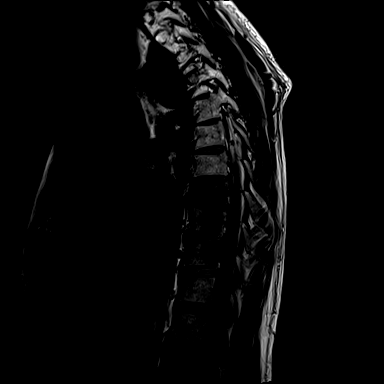
[im 18/18]
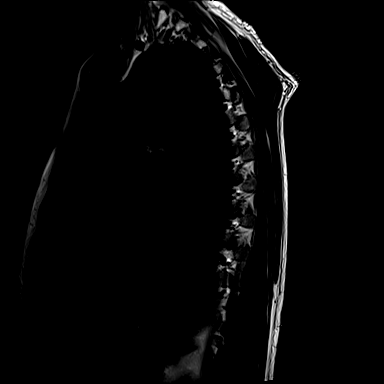

[Series 15: STIR · sagittal · 3.0mm · 1.00mm/px · 3 of 15 slices shown]
[im 1/15]
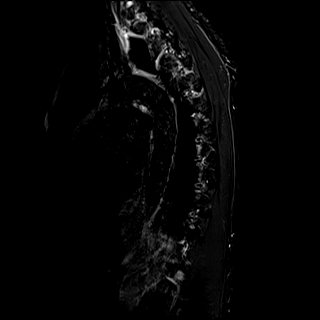
[im 8/15]
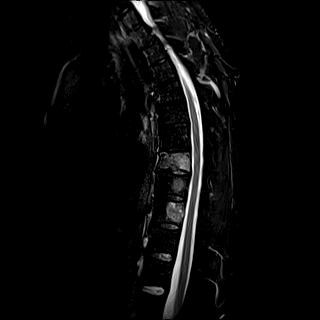
[im 15/15]
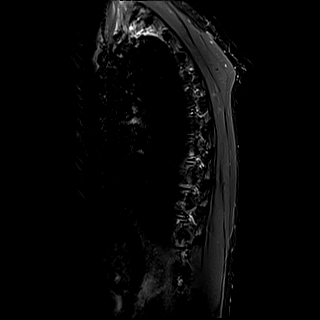

[Series 16: T2 · sagittal · 3.0mm · 0.83mm/px · 3 of 17 slices shown (1 of 2)]
[im 1/17]
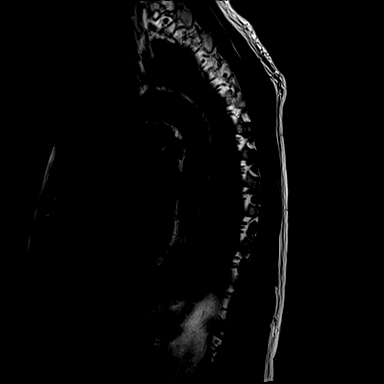
[im 9/17]
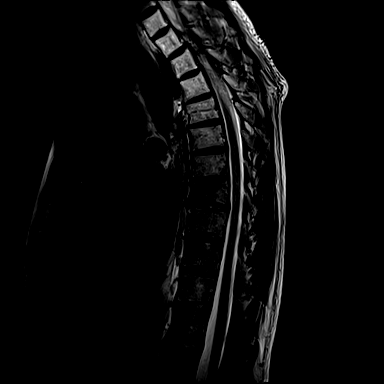
[im 17/17]
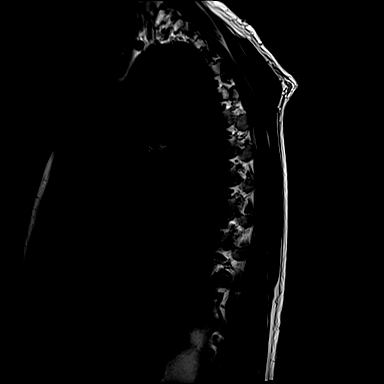

[Series 17: T2 · axial · 4.0mm · 0.35mm/px · z∈[-245,-69]mm · 7 of 33 slices shown (2 of 2)]
[im 1/33]
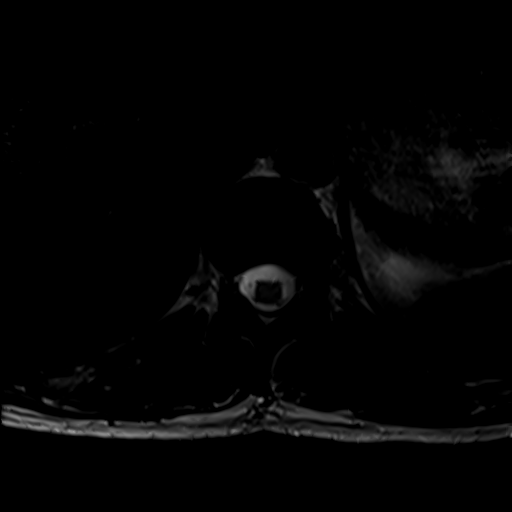
[im 6/33]
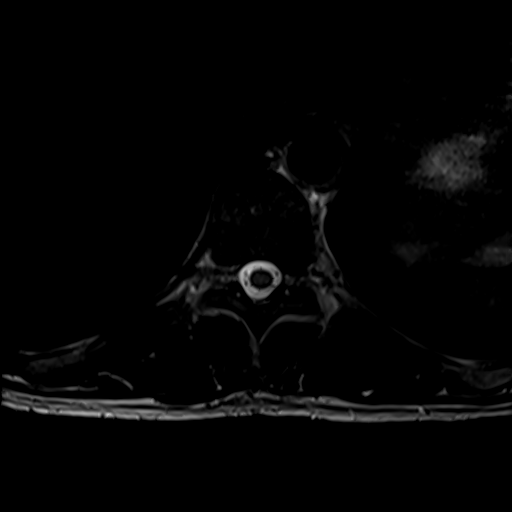
[im 11/33]
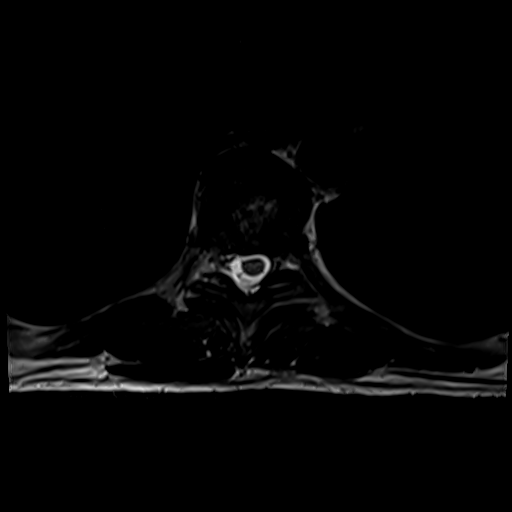
[im 17/33]
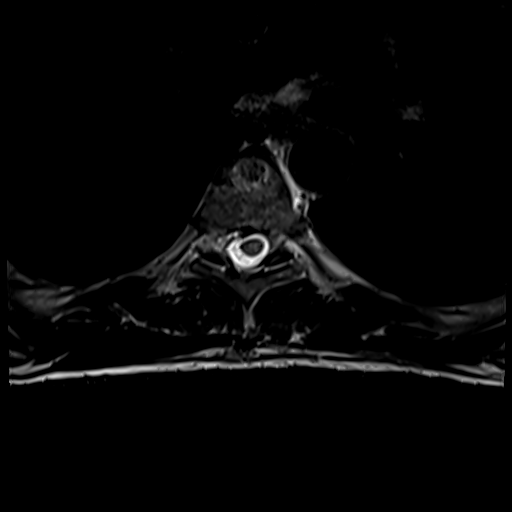
[im 22/33]
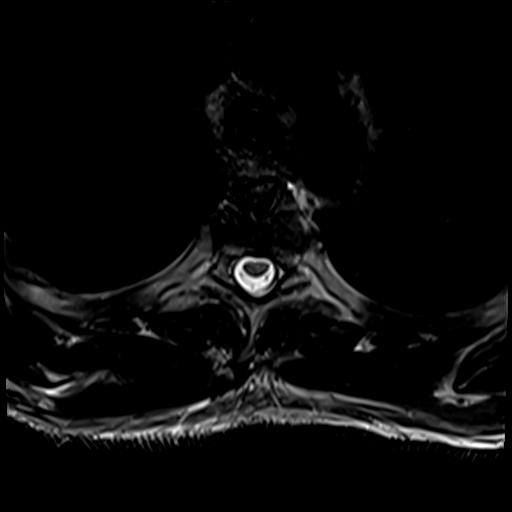
[im 27/33]
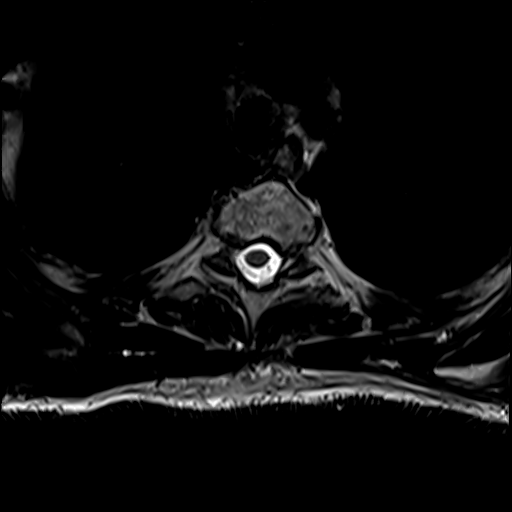
[im 33/33]
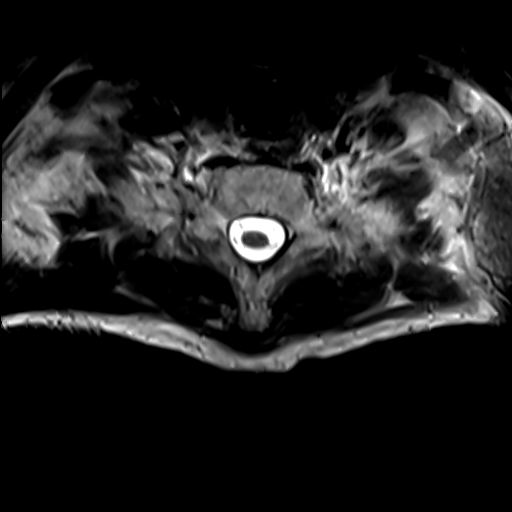

[Series 19: T1 · axial · non-contrast · 4.0mm · 0.70mm/px · z∈[-245,-69]mm · 7 of 33 slices shown (2 of 2)]
[im 1/33]
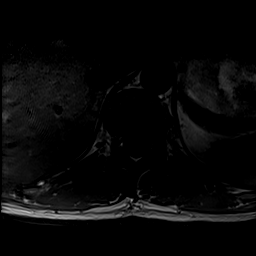
[im 6/33]
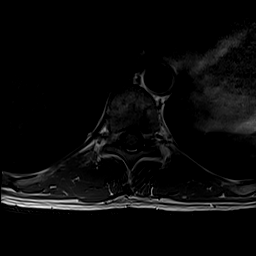
[im 11/33]
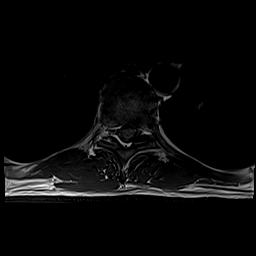
[im 17/33]
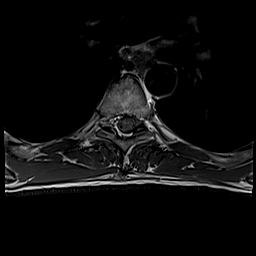
[im 22/33]
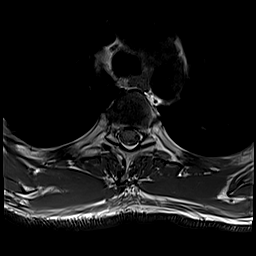
[im 27/33]
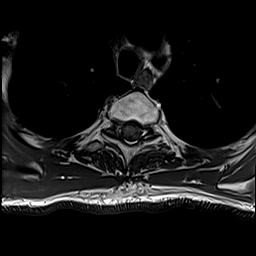
[im 33/33]
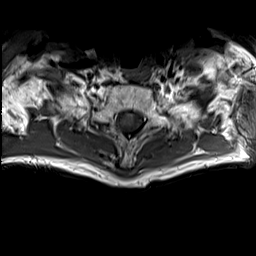

[Series 21: T1 fat-sat · sagittal · 3.0mm · 1.00mm/px · 3 of 15 slices shown]
[im 1/15]
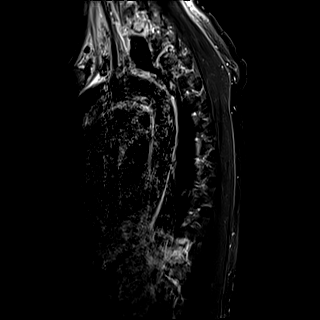
[im 8/15]
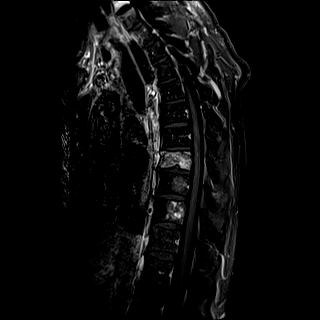
[im 15/15]
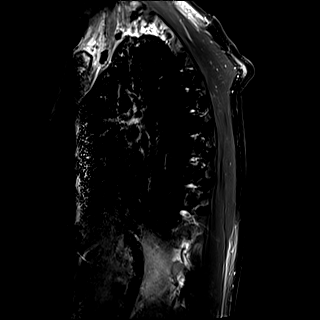

[Series 22: T1 post-contrast · axial · 4.0mm · 0.56mm/px · z∈[-245,-69]mm · 7 of 33 slices shown]
[im 1/33]
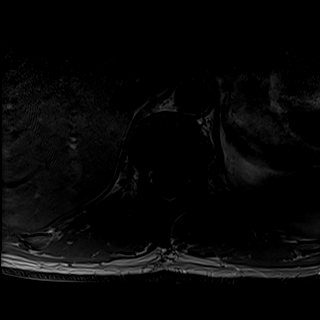
[im 6/33]
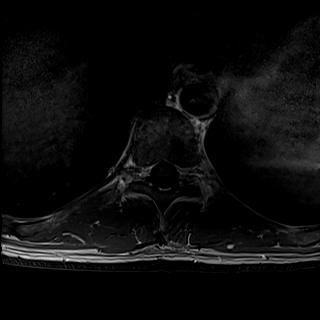
[im 11/33]
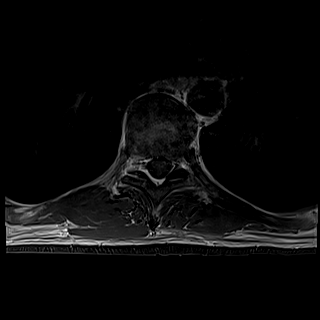
[im 17/33]
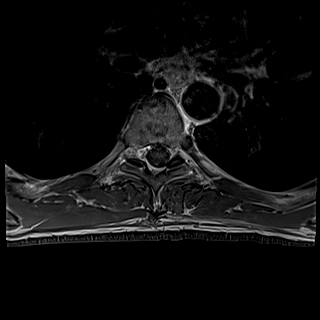
[im 22/33]
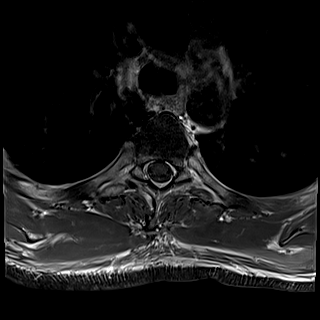
[im 27/33]
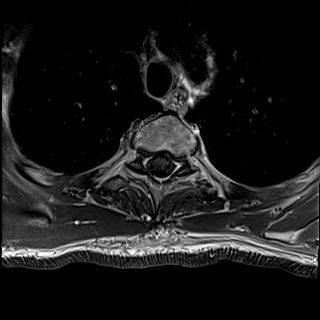
[im 33/33]
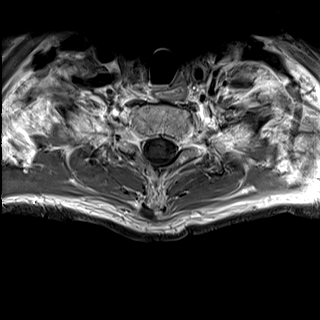

[36 of 48 positions shown; findings below may reference images not displayed]

FINDINGS: Alignment:  Normal

Vertebrae: Multiple enhancing metastatic deposits in the thoracic
spine involving T7 vertebral body, much of the T8 vertebral body,
T9, T10, and T11 vertebral bodies. The lesions show mild interval
growth since the prior study 6 days ago. There is a mild pathologic
fracture of T8 which is unchanged. No epidural tumor or cord
compression.

Cord:  Normal signal and morphology

Paraspinal and other soft tissues: Negative

Disc levels:

Mild thoracic disc degeneration. No significant stenosis. Small
central disc protrusion T4-5, T5-6, T6-7. Small central disc
protrusion T8-9.
IMPRESSION: Metastatic disease in the thoracic spine with mild progression since
6 days ago. Mild pathologic fracture T8

No epidural tumor and no cord compression.

## 2020-04-26 IMAGING — MR MR LUMBAR SPINE WO/W CM
4 of 7 series · 18 of 48 positions shown · IV contrast (14ml multihance)
Comparison: None.

CLINICAL DATA: Squamous cell carcinoma head neck. Known metastatic
disease.

EXAM:
MRI LUMBAR SPINE WITHOUT AND WITH CONTRAST
TECHNIQUE: Multiplanar and multiecho pulse sequences of the lumbar spine were
obtained without and with intravenous contrast.
CONTRAST:  14mL MULTIHANCE GADOBENATE DIMEGLUMINE 529 MG/ML IV SOLN

[Series 12: T1 · sagittal · 4.0mm · 0.73mm/px · 3 of 15 slices shown (1 of 2)]
[im 1/15]
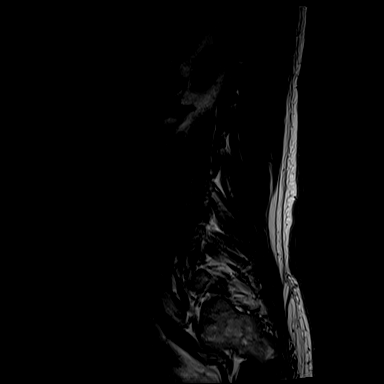
[im 8/15]
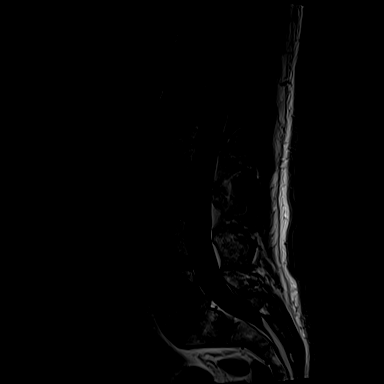
[im 15/15]
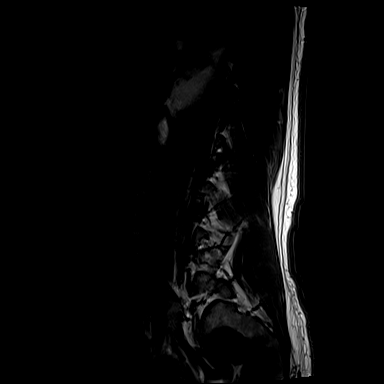

[Series 16: T1 · axial · 4.0mm · 0.28mm/px · z∈[-487,-302]mm · 3 of 41 slices shown (2 of 2)]
[im 5/41]
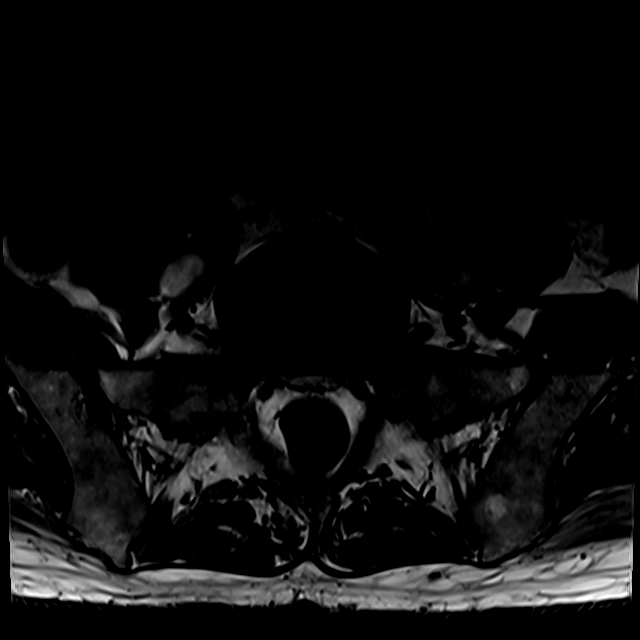
[im 21/41]
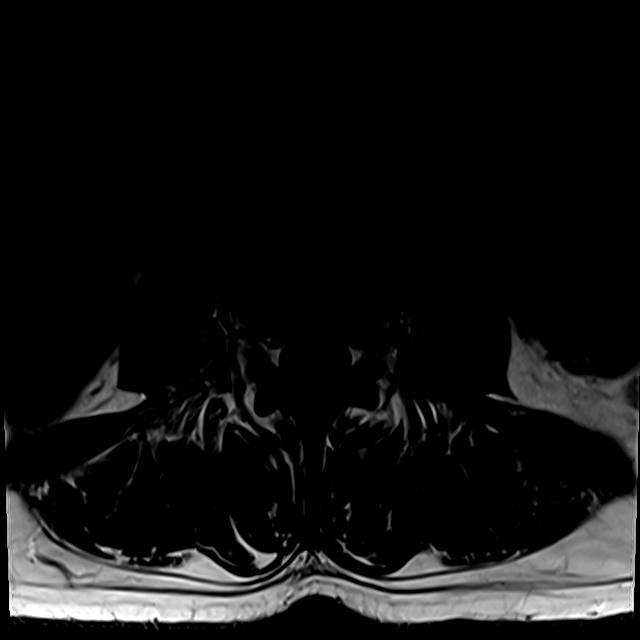
[im 37/41]
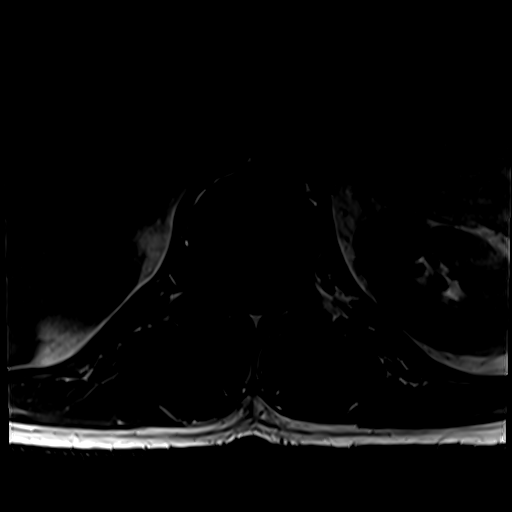

[Series 19: T2 · axial · 4.0mm · 0.28mm/px · z∈[-509,-304]mm · 9 of 43 slices shown (1 of 2)]
[im 1/43]
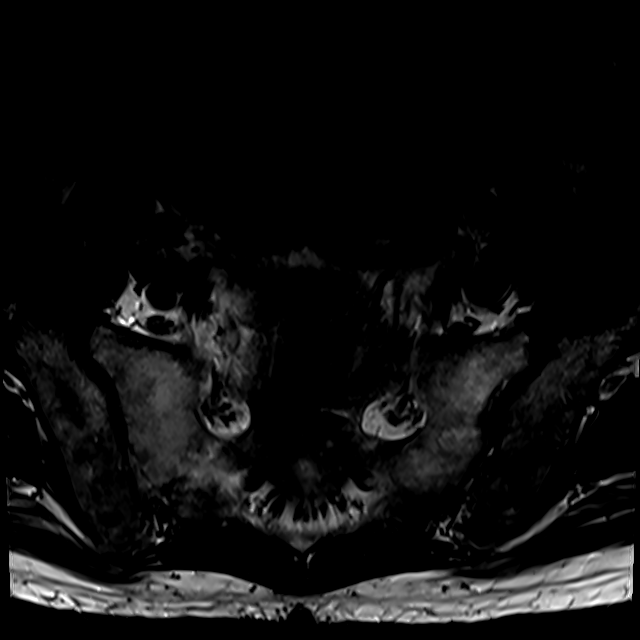
[im 5/43]
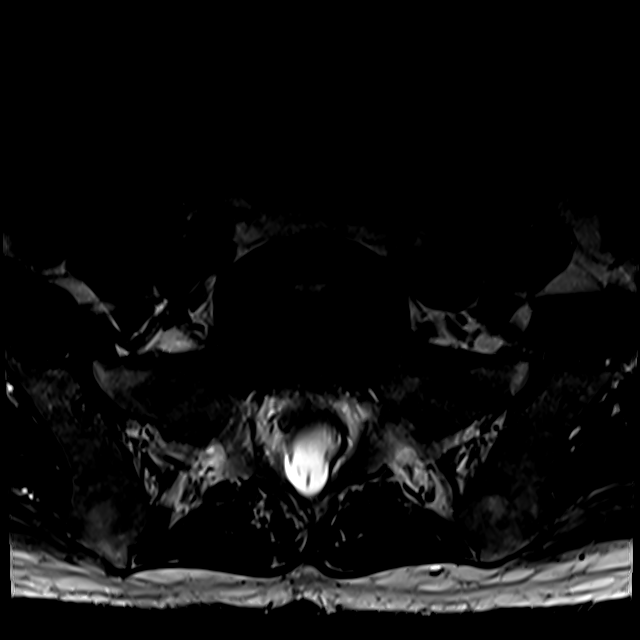
[im 9/43]
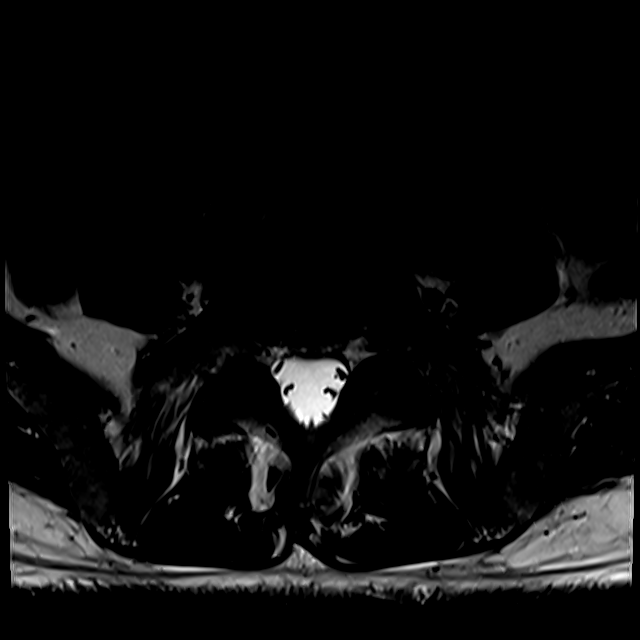
[im 13/43]
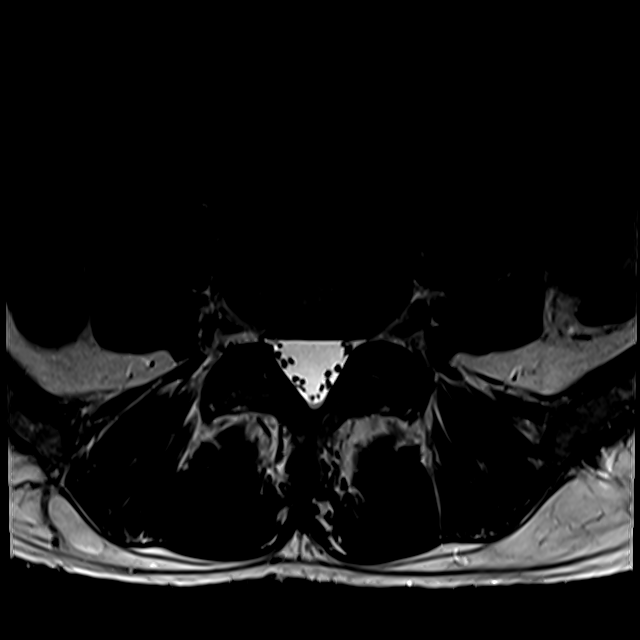
[im 17/43]
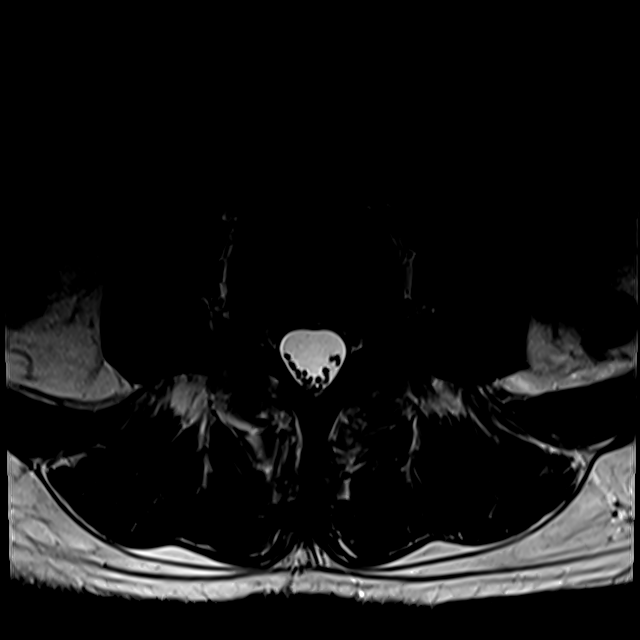
[im 22/43]
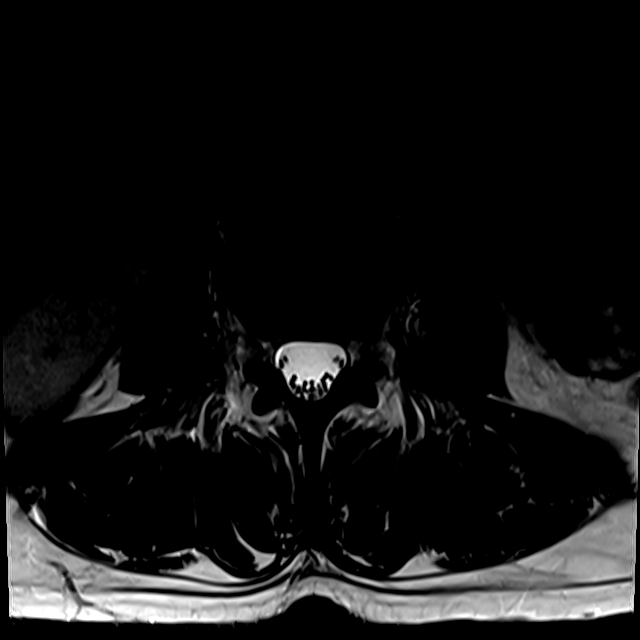
[im 26/43]
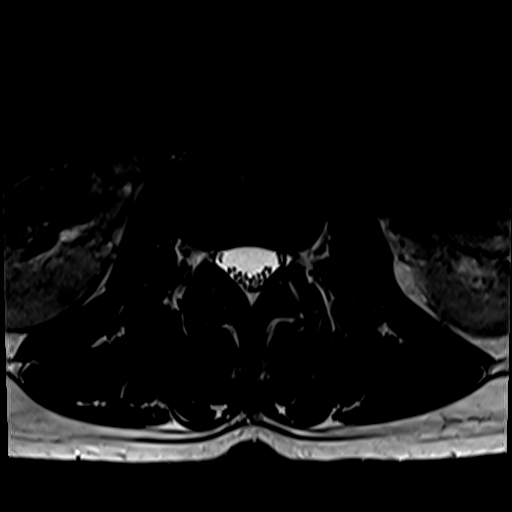
[im 30/43]
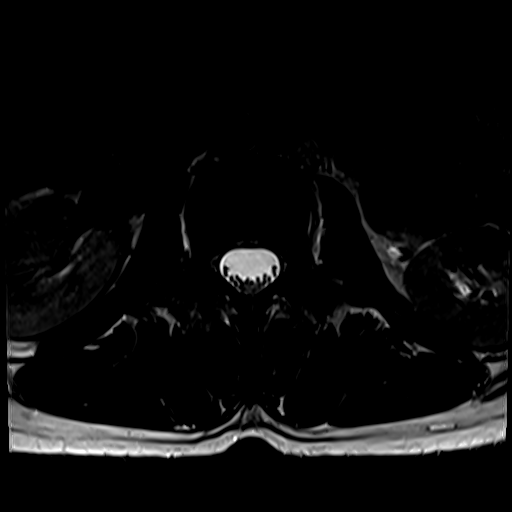
[im 38/43]
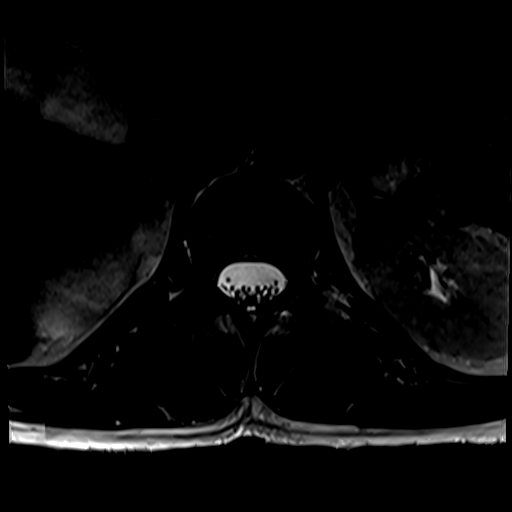

[Series 20: T2 · sagittal · 4.0mm · 0.73mm/px · 3 of 15 slices shown (2 of 2)]
[im 1/15]
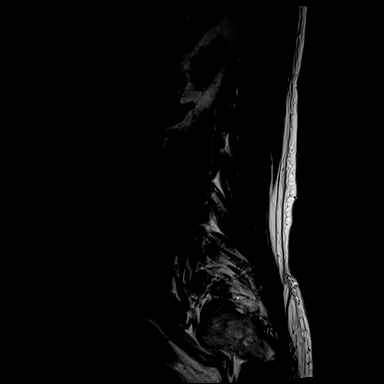
[im 10/15]
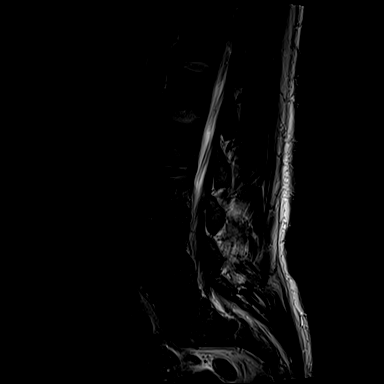
[im 15/15]
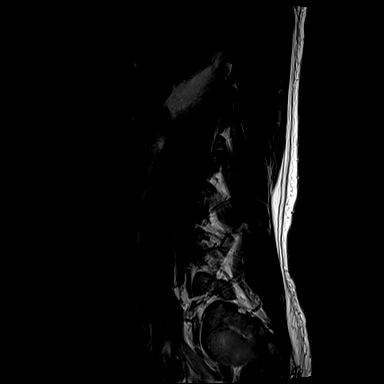

[18 of 48 positions shown; findings below may reference images not displayed]

FINDINGS: Segmentation:  Normal

Alignment:  Normal

Vertebrae: Multiple enhancing bone marrow lesions compatible with
widespread metastatic disease. This involves L1 vertebral body, L2,
L3, L5, as well as the sacrum and left iliac bone. No epidural
tumor. No fracture.

Conus medullaris and cauda equina: Conus extends to the L1-2 level.
Conus and cauda equina appear normal.

Paraspinal and other soft tissues: Negative for retroperitoneal
adenopathy.

Disc levels:

No significant lumbar spinal stenosis or neural impingement. Shallow
central disc protrusion at L5-S1 with annular tear.
IMPRESSION: Extensive metastatic disease throughout the lumbar spine as well as
the sacrum and left iliac bone. No pathologic fracture or epidural
tumor.

## 2020-04-26 MED ORDER — GADOBENATE DIMEGLUMINE 529 MG/ML IV SOLN
14.0000 mL | Freq: Once | INTRAVENOUS | Status: AC | PRN
  Administered 2020-04-26: 14 mL via INTRAVENOUS

## 2020-04-27 ENCOUNTER — Encounter: Payer: Self-pay | Admitting: Radiation Oncology

## 2020-04-27 NOTE — Progress Notes (Signed)
I returned a call to Mr. Cregg as he had let nursing know that he still has pain in his back and is wondering if he can escalate his medication.  I told him that it is very reasonable for him to try taking 2 capsules of gabapentin 3 times a day rather than 1 capsule per dose.  We also talked about his MRI results which remain suspicious for cancer of the spine.  While we knew that he had suspicious lesions in the thoracic spine, we also see suspicious lesions consistent with cancer in the lumbosacral spine, which is not surprising given what that is where his pain is.  He has a PET scan next week and this will be helpful to confirm or deny the findings from his MRI.  If these lesions are bright then it will be more suspicious that he has metastatic cancer in the spine.  He understands that it may be prudent for [place] to obtain a biopsy following the PET, this will be discussed further when I see him next week.  We also tentatively have him scheduled for CT simulation of the spine so that we can start palliative radiation sooner rather than later, if warranted.  I also left a voicemail for his wife at the patient's request so that she can stay in the loop on all of this information.  -----------------------------------  [name], MD

## 2020-05-02 ENCOUNTER — Inpatient Hospital Stay: Payer: Managed Care, Other (non HMO)

## 2020-05-04 ENCOUNTER — Encounter (HOSPITAL_COMMUNITY)
Admission: RE | Admit: 2020-05-04 | Discharge: 2020-05-04 | Disposition: A | Discharge status: Home / Self Care | Payer: Managed Care, Other (non HMO) | Source: Ambulatory Visit | Attending: Radiation Oncology | Admitting: Radiation Oncology

## 2020-05-04 ENCOUNTER — Other Ambulatory Visit: Payer: Self-pay

## 2020-05-04 DIAGNOSIS — C7951 Secondary malignant neoplasm of bone: Secondary | ICD-10-CM | POA: Diagnosis not present

## 2020-05-04 LAB — GLUCOSE, CAPILLARY: Glucose-Capillary: 119 mg/dL — ABNORMAL HIGH (ref 70–99)

## 2020-05-04 IMAGING — PT NM PET TUM IMG RESTAG (PS) SKULL BASE T - THIGH
1 of 9 series · 2 of 25 positions shown · non-contrast
Comparison: [DATE]

CLINICAL DATA: Subsequent treatment strategy for head and neck
cancer.

EXAM:
NUCLEAR MEDICINE PET SKULL BASE TO THIGH
TECHNIQUE: 7.6 mCi F-18 FDG was injected intravenously. Full-ring PET imaging
was performed from the skull base to thigh after the radiotracer. CT
data was obtained and used for attenuation correction and anatomic
localization.
Fasting blood glucose:  mg/dl

[Series 4: ct hn_sk_th 5.0 bf37 · axial · 5.0mm · 0.98mm/px · z∈[-118,+118]mm · 2 of 235 slices shown]
[im 176/235  brain]
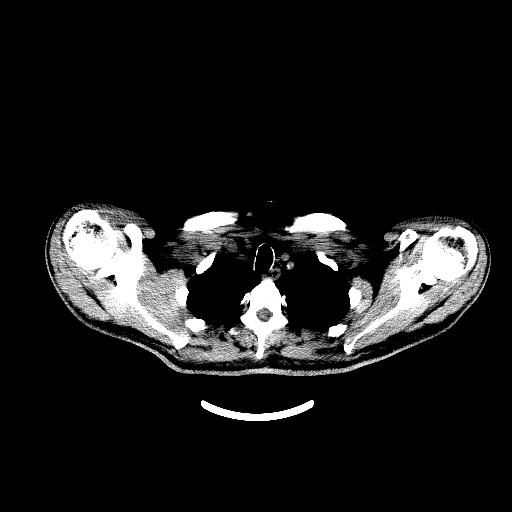
[im 235/235  brain]
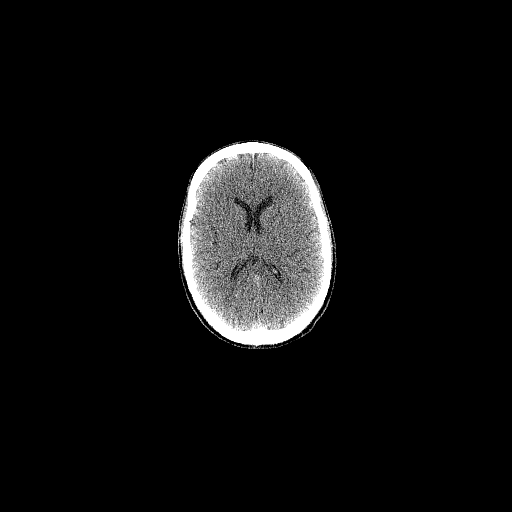

[2 of 25 positions shown; findings below may reference images not displayed]

FINDINGS: Mediastinal blood pool activity: SUV max

Liver activity: SUV max NA

NECK: No hypermetabolic lymph nodes in the neck.

Incidental CT findings: none

CHEST: Hypermetabolic lymphadenopathy in the right hilum identified
with SUV max = 6.2. Lymph nodes in the subcarinal station show low
level uptake.

14 mm left perifissural nodule on 84/4 shows low level
hypermetabolism with SUV max = 2.5. Small focus of FDG accumulation
in the left lower lobe may reflect misregistration to a 5 mm left
lower lobe nodule on 96/4.

There is patchy clustered ill-defined nodularity in the right upper
and lower lobes having a somewhat tree-in-bud configuration
suggesting infection.

Incidental CT findings: none

ABDOMEN/PELVIS: 3.5 cm necrotic inferior right liver mass (image
132/series 4) demonstrates SUV max = 11.5. No hypermetabolic
lymphadenopathy.

Incidental CT findings: There is abdominal aortic atherosclerosis
without aneurysm.

SKELETON: Diffuse uptake in the thoracolumbar spine is suspicious
for metastatic involvement. New hypermetabolic right humeral head
lesion visible on image 63/4 shows SUV max = 10.1.

Left sternal lesion demonstrates SUV max = 6.1.

Mixed lytic and sclerotic lesion in the L3 vertebral body
demonstrates SUV max = 10.8.

3.7 cm right sacral lesion demonstrates SUV max = 12.1.

Numerous additional hypermetabolic bone lesions are seen in the bony
pelvis.

Incidental CT findings: None.
IMPRESSION: 1. Widespread hypermetabolic bone metastases.
2. 3.5 cm necrotic hypermetabolic metastatic lesion in the right
liver.
3. Hypermetabolic uptake in the right hilum associated with 2
hypermetabolic left lung nodules. Imaging appearance is concerning
for metastatic disease.
4. Patchy/nodular ground-glass opacity in the right lung having a
tree-in-bud configuration. This would be an atypical appearance for
metastatic disease and infectious etiology is favored, potentially
atypical.
5.  Aortic Atherosclerois ([AB]-170.0)

## 2020-05-04 MED ORDER — FLUDEOXYGLUCOSE F - 18 (FDG) INJECTION
7.5000 | Freq: Once | INTRAVENOUS | Status: AC
  Administered 2020-05-04: 7.63 via INTRAVENOUS

## 2020-05-05 ENCOUNTER — Telehealth: Payer: Self-pay

## 2020-05-05 NOTE — Progress Notes (Signed)
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 Widespread hypermetabolic bone metastases. 2. 3.5 cm necrotic hypermetabolic metastatic lesion in the right liver. 3. Hypermetabolic uptake in the right hilum associated with 2 hypermetabolic left lung nodules. Imaging appearance is concerning for metastatic disease. 4. Patchy/nodular ground-glass opacity in the right lung having a tree-in-bud configuration. This would be an atypical appearance for metastatic disease and infectious etiology is favored, potentially atypical. 5.  Aortic Atherosclerois  Location(s) of Symptomatic Metastases:  MRI Thoracic and Lumbar Spine w and w/o Contrast 04/26/2020 --Metastatic disease in the thoracic spine with mild progression since 6 days ago. 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  Past/Anticipated chemotherapy by medical oncology, if any: Nothing indicated with initial diagnosis. Will be referred to Dr. [name] [name] once biopsies are performed on liver/lung to see if any systemic therapy is warrented  If Spine Met(s), symptoms, if any, include:  Bowel/Bladder retention or incontinence (please describe): denies  Numbness or weakness in extremities (please describe): reports some numbness/tingling down left leg  Current Decadron regimen, if applicable: not currently prescribed   Ambulatory status? Walker? Wheelchair?: Able to ambulate, but must use cane for support. Reports significant pain to his left hip/leg when he first stands, but states it lessens slightly as he ambulates.   SAFETY ISSUES:  Prior radiation? Yes 06/12/2019--07/24/2019   Pacemaker/ICD? No  Possible current  pregnancy? N/A  Is the patient on methotrexate? No  Current Complaints / other details:   Working on getting a second opinion at [facility] in [place], [place]

## 2020-05-05 NOTE — Telephone Encounter (Signed)
-----  Message from [name], MD sent at 05/05/2020  9:30 AM EST ----- Hi all, This is a young pt with metastatic tonsil cancer. Please see PET and weigh in on whether y'all think that image guided bx of the liver vs bronch/bx of hilum is the best way to get tissue for him. We will want p16 and PDL-1 testing on the tissue for Dr Chryl Heck.    Once a consensus is made, Anderson Malta can put in the orders for bx. Anderson Malta, please get him a consult w/ Dr Chryl Heck next available.  Asal Teas, would you please let him know that I am out today but will discuss everything with him and his wife tomorrow? Let them know there is some soft tissue disease likely in the liver and lung and we are figuring out the best way to get tissue from one of those sites.  That will guide [place] re: what the best medication is to get control of the disease systemically.  I will definitely start planning the RT to his spine tomorrow, but we are referring him to medical oncology so we can be aggressive w/ the disease throughout his whole body with the optimal chemo or immunotherapy.  Thanks, everyone!  Judson Roch

## 2020-05-05 NOTE — Telephone Encounter (Signed)
Called both patient and his wife to relay message from Dr. Isidore Moos. Both verbalized understanding and agreement of plan, and denied any other needs at this time. They both have my direct number should they have any questions before their appointment with Dr. Isidore Moos tomorrow 05/06/20

## 2020-05-06 ENCOUNTER — Other Ambulatory Visit: Payer: Self-pay

## 2020-05-06 ENCOUNTER — Ambulatory Visit
Admission: RE | Admit: 2020-05-06 | Discharge: 2020-05-06 | Disposition: A | Discharge status: Home / Self Care | Payer: Managed Care, Other (non HMO) | Source: Ambulatory Visit | Attending: Radiation Oncology | Admitting: Radiation Oncology

## 2020-05-06 ENCOUNTER — Other Ambulatory Visit: Payer: Self-pay | Admitting: Radiation Oncology

## 2020-05-06 VITALS — BP 113/80 | HR 87 | Temp 97.5°F | Resp 18 | Ht 70.0 in | Wt 148.4 lb

## 2020-05-06 DIAGNOSIS — C7951 Secondary malignant neoplasm of bone: Secondary | ICD-10-CM

## 2020-05-06 DIAGNOSIS — C090 Malignant neoplasm of tonsillar fossa: Secondary | ICD-10-CM | POA: Insufficient documentation

## 2020-05-06 DIAGNOSIS — Z510 Encounter for antineoplastic radiation therapy: Secondary | ICD-10-CM | POA: Insufficient documentation

## 2020-05-06 DIAGNOSIS — C01 Malignant neoplasm of base of tongue: Secondary | ICD-10-CM

## 2020-05-06 DIAGNOSIS — C787 Secondary malignant neoplasm of liver and intrahepatic bile duct: Secondary | ICD-10-CM | POA: Insufficient documentation

## 2020-05-06 MED ORDER — GABAPENTIN 300 MG PO CAPS: 300.0000 mg | ORAL_CAPSULE | Freq: Three times a day (TID) | ORAL | 3 refills | Status: DC

## 2020-05-06 MED ORDER — ONDANSETRON 8 MG PO TBDP: 8.0000 mg | ORAL_TABLET | Freq: Three times a day (TID) | ORAL | 3 refills | Status: AC | PRN

## 2020-05-06 NOTE — Progress Notes (Signed)
Radiation Oncology         [phone] ________________________________  Re-Consultation Note  Name: Albert Flowers MRN: [id number]  Date: 05/06/2020  DOB: 11-03-1970  QA:STMH, Edwyna Shell, MD  [name], MD   REFERRING PHYSICIAN: [name], MD  DIAGNOSIS:    ICD-10-CM   1. Spine metastasis ([place])  C79.51   2. Malignant neoplasm of base of tongue ([facility])  C01 gabapentin (NEURONTIN) 300 MG capsule    ondansetron (ZOFRAN ODT) 8 MG disintegrating tablet  3. 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   CHIEF COMPLAINT: Here to discuss management of metastatic tongue cancer  HISTORY OF PRESENT ILLNESS::Albert Flowers is a 50 y.o. male who is well known by me and has been treated in the past for his tongue base cancer. He was last seen in follow-up on 04/01/2020, at which time there was no evidence of disease regarding his head and neck cancer. However, he did report chronic back pain and new sciatic pain. A referral was made to the neurosurgical clinic. T spine MRI wo contrast suspicious for metastatic disease. I then continued his work up:  Pertinent imaging studies since his last visit includes: 1. MRI of thoracic spine on 04/20/2020 that showed findings compatible with multiple osseous metastatic lesions in the thoracic spine. 2. MRI of lumbar spine on 04/26/2020 that showed extensive metastatic disease throughout the lumbar spine as well as the sacrum and left iliac bone. There was no pathologic fracture or epidural tumor. 3. MRI of thoracic span 04/26/2020 that showed metastatic disease in the thoracic spine with mild progression since six days prior. There was also noted to be a mild pathologic fracture at T8. However, there was no epidural tumor or cord compression. 4. MRI of cervical spine on 04/26/2020 that showed multilevel degenerative change without metastatic disease in the cervical spine. 5. PET scan on 05/04/2020  that showed widespread hypermetabolic bone metastases. It showed a 3.5 cm necrotic hypermetabolic metastatic lesion in the right liver and hypermetabolic uptake in the right hilum associated with two hypermetabolic left lung nodules that were concerning for metastatic disease. Additionally, there was a patchy/nodular ground-glass opacity in the right lung that had a tree-in-bud configuration; an atypical appearance for metastatic disease and infectious etiology was favored, potentially atypical.  I have discussed his case with [facility], IR,pulmonology and medical oncology and personally reviewed his images.  Consensus is to pursue biopsy of the liver and send of p16 testing and PDL1 testing.  Today he is scheduled for CT simulation of the spine. I will add his left hip to the spine fields given his PET findings.   Past/Anticipated chemotherapy by medical oncology, if any: Nothing indicated with initial diagnosis. Will be referred to Dr. [name] [name] once biopsies are performed on liver/lung to see if any systemic therapy is warrented  If Spine Met(s), symptoms, if any, include:  Bowel/Bladder retention or incontinence (please describe): denies  Numbness or weakness in extremities (please describe): reports some numbness/tingling down left leg and needs to manually lift leg when getting out of care  Current Decadron regimen, if applicable: not currently prescribed ; taking gabapentin $RemoveBeforeDEI'300mg'RwrUuJXucLGIzMcj$  TID with not alleviation of pain  Ambulatory status? Walker? Wheelchair?: Able to ambulate, but must use cane for support. Reports significant pain to his left hip/leg when he first stands, but states it lessens slightly as he ambulates.   SAFETY ISSUES:  Prior radiation? Yes 06/12/2019--07/24/2019  Pacemaker/ICD? No  Possible current pregnancy? N/A  Is the patient on methotrexate? No  Current Complaints / other details:   Working on getting a second opinion at [facility] in [place], [place]:  has a past medical history of Diabetes mellitus without complication ([place]).    PAST SURGICAL HISTORY: Past Surgical History:  Procedure Laterality Date  . left neck dissection Left 05/04/2019   Left Neck Dis[name].   . neck sugery     as a child "had a knot removed from neck" not sure which side.   . RIght neck dissection Right 06/16/2018   TORS and right neck dissection. Dr. [name] at [facility]: family history is not on file.  SOCIAL HISTORY:  reports that he has never smoked. He has never used smokeless tobacco. He reports current alcohol use. He reports that he does not use drugs.  ALLERGIES: Penicillins  MEDICATIONS:  Current Outpatient Medications  Medication Sig Dispense Refill  . diphenhydrAMINE (BENADRYL) 25 MG tablet Take 25 mg by mouth at bedtime.    . gabapentin (NEURONTIN) 300 MG capsule Take 900 mg by mouth 3 (three) times daily.    . ondansetron (ZOFRAN ODT) 8 MG disintegrating tablet Take 1 tablet (8 mg total) by mouth every 8 (eight) hours as needed for nausea or vomiting. 30 tablet 3  . acetaminophen (TYLENOL) 500 MG tablet Take 500 mg by mouth every 6 (six) hours as needed. (Patient not taking: No sig reported)    . aspirin EC 81 MG tablet Take 81 mg by mouth daily. (Patient not taking: No sig reported)    . celecoxib (CELEBREX) 100 MG capsule Take 100 mg by mouth 2 (two) times daily. (Patient not taking: No sig reported)    . gabapentin (NEURONTIN) 300 MG capsule Take 1 capsule (300 mg total) by mouth 3 (three) times daily. 90 capsule 3  . metFORMIN (GLUCOPHAGE) 1000 MG tablet Take 1,000 mg by mouth 2 (two) times daily with a meal.  (Patient not taking: No sig reported)    . naproxen (NAPROSYN) 500 MG tablet Take 500 mg by mouth 2 (two) times daily as needed. (Patient not taking: No sig reported)    . nystatin (MYCOSTATIN) 100000 UNIT/ML suspension Take 5 mLs (500,000 Units total) by mouth 4 (four) times  daily. Swish and spit or swallow until better. (Patient not taking: No sig reported) 240 mL 0  . rosuvastatin (CRESTOR) 20 MG tablet  (Patient not taking: No sig reported)    . sodium fluoride (PREVIDENT 5000 PLUS) 1.1 % CREA dental cream Patient to use as directed on a toothbrush or alternatively in his fluoride trays as instructed.  Patient is to spit out excess and not swallow.  Repeat nightly. (Patient not taking: No sig reported) 2 Tube prn  . sucralfate (CARAFATE) 1 g tablet Dissolve 1 tablet in 10 mL H20 and swallow 20 min prior to meals and bedtime. (Patient not taking: No sig reported) 40 tablet 3   No current facility-administered medications for this encounter.    REVIEW OF SYSTEMS:  Notable for that above.   PHYSICAL EXAM:  height is $RemoveB'5\' 10"'fwKqXfyl$  (1.778 m) and weight is 148 lb 6 oz (67.3 kg). His temporal temperature is 97.5 F (36.4 C) (abnormal). His blood pressure is 113/80 and his pulse is 87. His respiration is 18 and oxygen saturation is 100%.   General: Alert and oriented, in no  acute distress Skin: paler than usual Psychiatric: Judgment and insight are intact. Affect is appropriate.  Today our encounter was focused upon counseling and care coordination - PE was abbreviated   ECOG = 1  0 - Asymptomatic (Fully active, able to carry on all predisease activities without restriction)  1 - Symptomatic but completely ambulatory (Restricted in physically strenuous activity but ambulatory and able to carry out work of a light or sedentary nature. For example, light housework, office work)  2 - Symptomatic, <50% in bed during the day (Ambulatory and capable of all self care but unable to carry out any work activities. Up and about more than 50% of waking hours)  3 - Symptomatic, >50% in bed, but not bedbound (Capable of only limited self-care, confined to bed or chair 50% or more of waking hours)  4 - Bedbound (Completely disabled. Cannot carry on any self-care. Totally confined to  bed or chair)  5 - Death   Eustace Pen MM, Creech RH, Tormey DC, et al. [phone]). "Toxicity and response criteria of the [facility]". [place] Oncol. 5 (6): 649-55   LABORATORY DATA:  Lab Results  Component Value Date   WBC 5.0 05/11/2020   HGB 12.5 (L) 05/11/2020   HCT 37.9 (L) 05/11/2020   MCV 91.3 05/11/2020   PLT 339 05/11/2020   CMP     Component Value Date/Time   NA 136 05/11/2020 1201   K 4.3 05/11/2020 1201   CL 100 05/11/2020 1201   CO2 28 05/11/2020 1201   GLUCOSE 106 (H) 05/11/2020 1201   BUN 12 05/11/2020 1201   CREATININE 0.94 05/11/2020 1201   CALCIUM 9.5 05/11/2020 1201   PROT 7.5 05/11/2020 1201   ALBUMIN 3.9 05/11/2020 1201   AST 24 05/11/2020 1201   ALT 8 05/11/2020 1201   ALKPHOS 134 (H) 05/11/2020 1201   BILITOT 0.2 (L) 05/11/2020 1201   GFRNONAA >60 05/11/2020 1201   GFRAA >60 11/12/2019 1144         RADIOGRAPHY: MR CERVICAL SPINE W WO CONTRAST  Result Date: 04/26/2020 CLINICAL DATA:  Metastatic head neck cancer. EXAM: MRI CERVICAL SPINE WITHOUT AND WITH CONTRAST TECHNIQUE: Multiplanar and multiecho pulse sequences of the cervical spine, to include the craniocervical junction and cervicothoracic junction, were obtained without and with intravenous contrast. CONTRAST:  64mL MULTIHANCE GADOBENATE DIMEGLUMINE 529 MG/ML IV SOLN COMPARISON:  None. FINDINGS: Alignment: Mild retrolisthesis C4-5. Vertebrae: Diffuse fatty bone marrow may represent radiation change. No metastatic disease in the cervical spine. No fracture. Cord: Normal signal and morphology. Posterior Fossa, vertebral arteries, paraspinal tissues: Negative Disc levels: C2-3: Right foraminal disc protrusion with right foraminal stenosis C3-4: Left foraminal narrowing due to spurring. C4-5: Central disc protrusion and associated spurring right greater than left. Moderate right foraminal stenosis. Cord flattening and mild spinal stenosis right greater than left. Mild left foraminal  narrowing. C5-6: Central disc protrusion with cord flattening and mild spinal stenosis. Neural foramina patent bilaterally. C6-7: Shallow central disc protrusion.  Negative for stenosis C7-T1: Negative IMPRESSION: Negative for metastatic disease cervical spine Multilevel degenerative change as detailed above. Electronically Signed   By: Franchot Gallo M.D.   On: 04/26/2020 12:15   MR THORACIC SPINE W WO CONTRAST  Result Date: 04/26/2020 CLINICAL DATA:  Head neck cancer with known metastatic disease. EXAM: MRI THORACIC WITHOUT AND WITH CONTRAST TECHNIQUE: Multiplanar and multiecho pulse sequences of the thoracic spine were obtained without and with intravenous contrast. CONTRAST:  15mL MULTIHANCE GADOBENATE DIMEGLUMINE 529  MG/ML IV SOLN COMPARISON:  Thoracic MRI 04/20/2020 FINDINGS: Alignment:  Normal Vertebrae: Multiple enhancing metastatic deposits in the thoracic spine involving T7 vertebral body, much of the T8 vertebral body, T9, T10, and T11 vertebral bodies. The lesions show mild interval growth since the prior study 6 days ago. There is a mild pathologic fracture of T8 which is unchanged. No epidural tumor or cord compression. Cord:  Normal signal and morphology Paraspinal and other soft tissues: Negative Disc levels: Mild thoracic disc degeneration. No significant stenosis. Small central disc protrusion T4-5, T5-6, T6-7. Small central disc protrusion T8-9. IMPRESSION: Metastatic disease in the thoracic spine with mild progression since 6 days ago. Mild pathologic fracture T8 No epidural tumor and no cord compression. Electronically Signed   By: Franchot Gallo M.D.   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 EXAM: MRI LUMBAR SPINE WITHOUT AND WITH CONTRAST TECHNIQUE: Multiplanar and multiecho pulse sequences of the lumbar spine were obtained without and with intravenous contrast. CONTRAST:   58mL MULTIHANCE GADOBENATE DIMEGLUMINE 529 MG/ML IV SOLN COMPARISON:  None. FINDINGS: Segmentation:  Normal Alignment:  Normal Vertebrae: Multiple enhancing bone marrow lesions compatible with widespread metastatic disease. This involves L1 vertebral body, L2, L3, L5, as well as the sacrum and left iliac bone. No epidural tumor. No fracture. Conus medullaris and cauda equina: Conus extends to the L1-2 level. Conus and cauda equina appear normal. Paraspinal and other soft tissues: Negative for retroperitoneal adenopathy. Disc levels: No significant lumbar spinal stenosis or neural impingement. Shallow central disc protrusion at L5-S1 with annular tear. IMPRESSION: Extensive metastatic disease throughout the lumbar spine as well as the sacrum and left iliac bone. No pathologic fracture or epidural tumor. Electronically Signed   By: Franchot Gallo M.D.   On: 04/26/2020 12:05   NM PET Image Restag (PS) Skull Base To Thigh  Result Date: 05/04/2020 CLINICAL DATA:  Subsequent treatment strategy for head and neck cancer. EXAM: NUCLEAR MEDICINE PET SKULL BASE TO THIGH TECHNIQUE: 7.6 mCi F-18 FDG was injected intravenously. Full-ring PET imaging was performed from the skull base to thigh after the radiotracer. CT data was obtained and used for attenuation correction and anatomic localization. Fasting blood glucose:  mg/dl COMPARISON:  05/28/2018 FINDINGS: Mediastinal blood pool activity: SUV max 2.4 Liver activity: SUV max NA NECK: No hypermetabolic lymph nodes in the neck. Incidental CT findings: none CHEST: Hypermetabolic lymphadenopathy in the right hilum identified with SUV max = 6.2. Lymph nodes in the subcarinal station show low level uptake. 14 mm left perifissural nodule on 84/4 shows low level hypermetabolism with SUV max = 2.5. Small focus of FDG accumulation in the left lower lobe may reflect misregistration to a 5 mm left lower lobe nodule on 96/4. There is patchy clustered ill-defined nodularity in the right  upper and lower lobes having a somewhat tree-in-bud configuration suggesting infection. Incidental CT findings: none ABDOMEN/PELVIS: 3.5 cm necrotic inferior right liver mass (image 132/series 4) demonstrates SUV max = 11.5. No hypermetabolic lymphadenopathy. Incidental CT findings: There is abdominal aortic atherosclerosis without aneurysm. SKELETON: Diffuse uptake in the thoracolumbar spine is suspicious for metastatic involvement. New hypermetabolic right humeral head lesion visible on image 63/4 shows SUV max = 10.1. Left sternal lesion demonstrates SUV max = 6.1. Mixed lytic and sclerotic lesion in the L3 vertebral body demonstrates SUV max = 10.8. 3.7 cm right sacral lesion demonstrates SUV max = 12.1.  Numerous additional hypermetabolic bone lesions are seen in the bony pelvis. Incidental CT findings: None. IMPRESSION: 1. Widespread hypermetabolic bone metastases. 2. 3.5 cm necrotic hypermetabolic metastatic lesion in the right liver. 3. Hypermetabolic uptake in the right hilum associated with 2 hypermetabolic left lung nodules. Imaging appearance is concerning for metastatic disease. 4. Patchy/nodular ground-glass opacity in the right lung having a tree-in-bud configuration. This would be an atypical appearance for metastatic disease and infectious etiology is favored, potentially atypical. 5.  Aortic Atherosclerois (ICD10-170.0) Electronically Signed   By: Misty Stanley M.D.   On: 05/04/2020 20:27      IMPRESSION/PLAN: Metastatic tongue base cancer  Today, I talked to the patient about the findings and work-up thus far. We discussed the patient's diagnosis of metastatic cancer to bone and liver (thoracic finding on PET are nonspecific) and general treatment for this, highlighting the role of palliative radiotherapy in the management. We discussed the available radiation techniques, and focused on the details of logistics and delivery.    I recommend 2-3 weeks of palliative treatment to the T-L-S  spine and left hip which is also highly involved with disease. Referral to orthopedic surgery in case hip stabilization is needed in future.  Liver biopsy by US guidance ordered as well to discern systemic options.  [facility] second opinion w/ med onc pending, will also see Dr Chryl Heck here.  We discussed the risks, benefits, and side effects of radiotherapy. Side effects may include but not necessarily be limited to: fatigue, skin irritation, nausea, diarrhea, rare injury to heart, lungs, kidney, liver, bowel, rectum, bladder, bone.  Bone marrow suppression will likely occur to some degree due to extend of spine/hip disease warranting treatment. We discussed possibility of sterility but I do not anticipate nerve damage to the genitalia.  No guarantees of treatment were given. A consent form was signed and placed in the patient's medical record. The patient was encouraged to ask questions that I answered to the best of my ability. He and his wife are eager to start treatment soon, will schedule tx start for next week. Continue gabapentin (refilled) and consider steroid if pain worsens.  CBC ordered - he looks pale. CMP ordered as well.  He is a truly wonderful person and it is very upsetting to see his disease progress like this; he and his wife and I share the goal to be aggressive with his treatment and get ahead of this metastatic process.   On date of service, in total, I spent 30 minutes on this encounter. Patient was seen in person.   __________________________________________   [name], MD  This document serves as a record of services personally performed by [name], MD. It was created on his behalf by Clerance Lav, a trained medical scribe. The creation of this record is based on the scribe's personal observations and the provider's statements to them. This document has been checked and approved by the attending provider.

## 2020-05-09 ENCOUNTER — Other Ambulatory Visit: Payer: Self-pay

## 2020-05-09 ENCOUNTER — Encounter: Payer: Self-pay | Admitting: Radiation Oncology

## 2020-05-09 ENCOUNTER — Encounter (HOSPITAL_COMMUNITY): Payer: Self-pay | Admitting: Radiology

## 2020-05-09 ENCOUNTER — Ambulatory Visit: Payer: Managed Care, Other (non HMO)

## 2020-05-09 DIAGNOSIS — C7951 Secondary malignant neoplasm of bone: Secondary | ICD-10-CM | POA: Insufficient documentation

## 2020-05-09 NOTE — Progress Notes (Signed)
Tannor Pyon Male, 50 y.o., [birth year]  MRN:  [id number] Phone:  [phone] (H)       PCP:  [name], MD Coverage:  Cigna/Cigna Managed  Next Appt With Radiation Oncology 05/11/2020 at 11:00 AM           RE: US Liver Biopsy Received: Today Arne Cleveland, MD  [place], Ramez Arrona D  Ok   [place] core liver lesion  See below for special requests for lab   DDH        Previous Messages   ----- Message -----  From: Jillyn Hidden  Sent: 05/09/2020  9:20 AM EST  To: Arne Cleveland, MD  Subject: US Liver Biopsy                  Procedure: US BIOPSY (LIVER)   Reason: Malignant neoplasm of base of tongue, liver lesion, please send for PDL-1 and p16 if positive for cancer; Dr Isidore Moos referring case to Dr Vernard Gambles (They spoke on phone)   History: CT, MR, NM in computer.   Provider: [name]   Provider Contact: [phone]

## 2020-05-10 ENCOUNTER — Other Ambulatory Visit: Payer: Self-pay

## 2020-05-10 DIAGNOSIS — C01 Malignant neoplasm of base of tongue: Secondary | ICD-10-CM

## 2020-05-10 DIAGNOSIS — Z510 Encounter for antineoplastic radiation therapy: Secondary | ICD-10-CM | POA: Diagnosis not present

## 2020-05-11 ENCOUNTER — Other Ambulatory Visit: Payer: Self-pay | Admitting: Radiology

## 2020-05-11 ENCOUNTER — Ambulatory Visit
Admission: RE | Admit: 2020-05-11 | Discharge: 2020-05-11 | Disposition: A | Discharge status: Home / Self Care | Payer: Managed Care, Other (non HMO) | Source: Ambulatory Visit | Attending: Radiation Oncology | Admitting: Radiation Oncology

## 2020-05-11 ENCOUNTER — Ambulatory Visit: Payer: Managed Care, Other (non HMO)

## 2020-05-11 ENCOUNTER — Other Ambulatory Visit: Payer: Self-pay

## 2020-05-11 DIAGNOSIS — Z510 Encounter for antineoplastic radiation therapy: Secondary | ICD-10-CM | POA: Diagnosis not present

## 2020-05-11 DIAGNOSIS — C01 Malignant neoplasm of base of tongue: Secondary | ICD-10-CM

## 2020-05-11 LAB — CMP (CANCER CENTER ONLY)
ALT: 8 U/L (ref 0–44)
AST: 24 U/L (ref 15–41)
Albumin: 3.9 g/dL (ref 3.5–5.0)
Alkaline Phosphatase: 134 U/L — ABNORMAL HIGH (ref 38–126)
Anion gap: 8 (ref 5–15)
BUN: 12 mg/dL (ref 6–20)
CO2: 28 mmol/L (ref 22–32)
Calcium: 9.5 mg/dL (ref 8.9–10.3)
Chloride: 100 mmol/L (ref 98–111)
Creatinine: 0.94 mg/dL (ref 0.61–1.24)
GFR, Estimated: >60 mL/min (ref >60)
Glucose, Bld: 106 mg/dL — ABNORMAL HIGH (ref 70–99)
Potassium: 4.3 mmol/L (ref 3.5–5.1)
Sodium: 136 mmol/L (ref 135–145)
Total Bilirubin: 0.2 mg/dL — ABNORMAL LOW (ref 0.3–1.2)
Total Protein: 7.5 g/dL (ref 6.5–8.1)

## 2020-05-11 LAB — CBC WITH DIFFERENTIAL (CANCER CENTER ONLY)
Abs Immature Granulocytes: 0.08 10*3/uL — ABNORMAL HIGH (ref 0.00–0.07)
Basophils Absolute: 0 10*3/uL (ref 0.0–0.1)
Basophils Relative: 1 %
Eosinophils Absolute: 0.1 10*3/uL (ref 0.0–0.5)
Eosinophils Relative: 2 %
HCT: 37.9 % — ABNORMAL LOW (ref 39.0–52.0)
Hemoglobin: 12.5 g/dL — ABNORMAL LOW (ref 13.0–17.0)
Immature Granulocytes: 2 %
Lymphocytes Relative: 13 %
Lymphs Abs: 0.7 10*3/uL (ref 0.7–4.0)
MCH: 30.1 pg (ref 26.0–34.0)
MCHC: 33 g/dL (ref 30.0–36.0)
MCV: 91.3 fL (ref 80.0–100.0)
Monocytes Absolute: 0.6 10*3/uL (ref 0.1–1.0)
Monocytes Relative: 13 %
Neutro Abs: 3.5 10*3/uL (ref 1.7–7.7)
Neutrophils Relative %: 69 %
Platelet Count: 339 10*3/uL (ref 150–400)
RBC: 4.15 MIL/uL — ABNORMAL LOW (ref 4.22–5.81)
RDW: 12.2 % (ref 11.5–15.5)
WBC Count: 5 10*3/uL (ref 4.0–10.5)
nRBC: 0 % (ref 0.0–0.2)

## 2020-05-12 ENCOUNTER — Ambulatory Visit
Admission: RE | Admit: 2020-05-12 | Discharge: 2020-05-12 | Disposition: A | Discharge status: Home / Self Care | Payer: Managed Care, Other (non HMO) | Source: Ambulatory Visit | Attending: Radiation Oncology | Admitting: Radiation Oncology

## 2020-05-12 ENCOUNTER — Ambulatory Visit (HOSPITAL_COMMUNITY)
Admission: RE | Admit: 2020-05-12 | Discharge: 2020-05-12 | Disposition: A | Discharge status: Home / Self Care | Payer: Managed Care, Other (non HMO) | Source: Ambulatory Visit | Attending: Radiation Oncology | Admitting: Radiation Oncology

## 2020-05-12 ENCOUNTER — Other Ambulatory Visit: Payer: Self-pay

## 2020-05-12 ENCOUNTER — Ambulatory Visit: Payer: Managed Care, Other (non HMO) | Admitting: Radiation Oncology

## 2020-05-12 ENCOUNTER — Encounter: Payer: Self-pay | Admitting: Radiation Oncology

## 2020-05-12 ENCOUNTER — Encounter (HOSPITAL_COMMUNITY): Payer: Self-pay

## 2020-05-12 DIAGNOSIS — C787 Secondary malignant neoplasm of liver and intrahepatic bile duct: Secondary | ICD-10-CM | POA: Insufficient documentation

## 2020-05-12 DIAGNOSIS — C7951 Secondary malignant neoplasm of bone: Secondary | ICD-10-CM | POA: Insufficient documentation

## 2020-05-12 DIAGNOSIS — C01 Malignant neoplasm of base of tongue: Secondary | ICD-10-CM | POA: Insufficient documentation

## 2020-05-12 DIAGNOSIS — Z510 Encounter for antineoplastic radiation therapy: Secondary | ICD-10-CM | POA: Diagnosis not present

## 2020-05-12 LAB — COMPREHENSIVE METABOLIC PANEL
ALT: 11 U/L (ref 0–44)
AST: 26 U/L (ref 15–41)
Albumin: 4.1 g/dL (ref 3.5–5.0)
Alkaline Phosphatase: 137 U/L — ABNORMAL HIGH (ref 38–126)
Anion gap: 11 (ref 5–15)
BUN: 14 mg/dL (ref 6–20)
CO2: 27 mmol/L (ref 22–32)
Calcium: 9.9 mg/dL (ref 8.9–10.3)
Chloride: 97 mmol/L — ABNORMAL LOW (ref 98–111)
Creatinine, Ser: 0.9 mg/dL (ref 0.61–1.24)
GFR, Estimated: >60 mL/min (ref >60)
Glucose, Bld: 137 mg/dL — ABNORMAL HIGH (ref 70–99)
Potassium: 4.4 mmol/L (ref 3.5–5.1)
Sodium: 135 mmol/L (ref 135–145)
Total Bilirubin: 0.6 mg/dL (ref 0.3–1.2)
Total Protein: 8.3 g/dL — ABNORMAL HIGH (ref 6.5–8.1)

## 2020-05-12 LAB — PROTIME-INR
INR: 0.9 (ref 0.8–1.2)
Prothrombin Time: 11.9 seconds (ref 11.4–15.2)

## 2020-05-12 LAB — CBC WITH DIFFERENTIAL/PLATELET
Abs Immature Granulocytes: 0.04 10*3/uL (ref 0.00–0.07)
Basophils Absolute: 0 10*3/uL (ref 0.0–0.1)
Basophils Relative: 1 %
Eosinophils Absolute: 0.1 10*3/uL (ref 0.0–0.5)
Eosinophils Relative: 1 %
HCT: 41.5 % (ref 39.0–52.0)
Hemoglobin: 13.8 g/dL (ref 13.0–17.0)
Immature Granulocytes: 1 %
Lymphocytes Relative: 10 %
Lymphs Abs: 0.5 10*3/uL — ABNORMAL LOW (ref 0.7–4.0)
MCH: 30.6 pg (ref 26.0–34.0)
MCHC: 33.3 g/dL (ref 30.0–36.0)
MCV: 92 fL (ref 80.0–100.0)
Monocytes Absolute: 0.7 10*3/uL (ref 0.1–1.0)
Monocytes Relative: 16 %
Neutro Abs: 3.2 10*3/uL (ref 1.7–7.7)
Neutrophils Relative %: 71 %
Platelets: 362 10*3/uL (ref 150–400)
RBC: 4.51 MIL/uL (ref 4.22–5.81)
RDW: 12.3 % (ref 11.5–15.5)
WBC: 4.4 10*3/uL (ref 4.0–10.5)
nRBC: 0 % (ref 0.0–0.2)

## 2020-05-12 LAB — GLUCOSE, CAPILLARY: Glucose-Capillary: 117 mg/dL — ABNORMAL HIGH (ref 70–99)

## 2020-05-12 IMAGING — US US BIOPSY CORE LIVER
1 series · 13 of 20 positions shown · non-contrast
Comparison: none

INDICATION: 49-year-old with squamous cell carcinoma at the base of the tongue.
Recent PET-CT imaging demonstrates multiple bone lesions and a liver
lesion. Request for a liver biopsy.

[Series 1: us biopsy core liver · 13 of 20 slices shown]
[im 1/20]
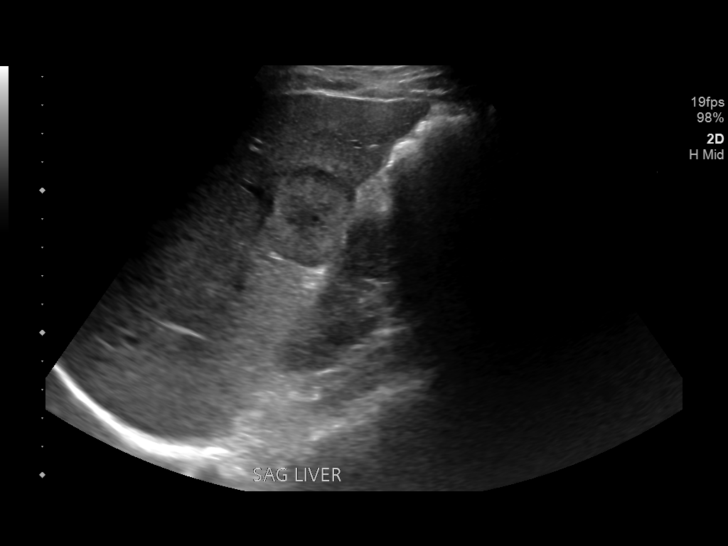
[im 3/20]
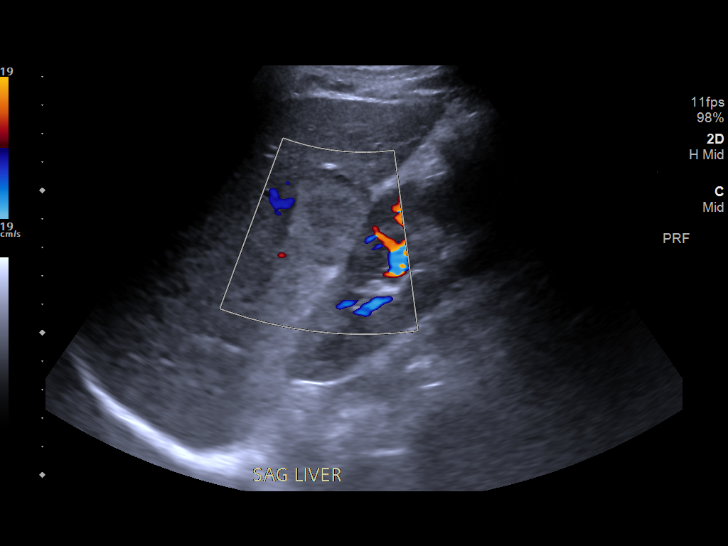
[im 4/20]
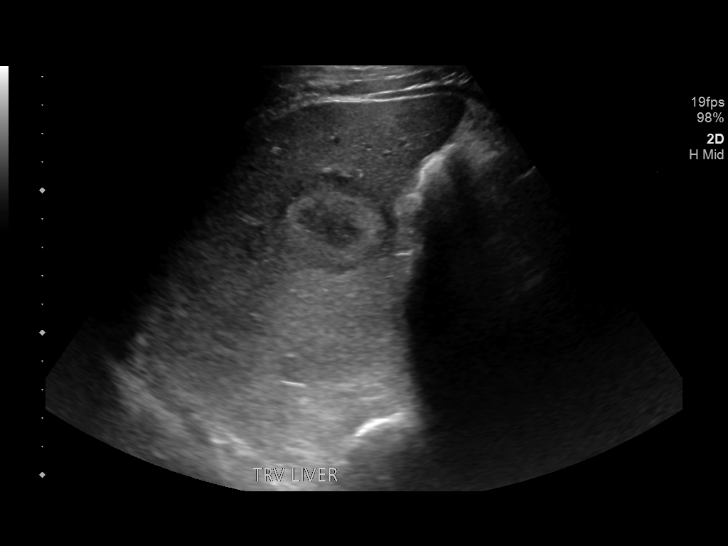
[im 6/20]
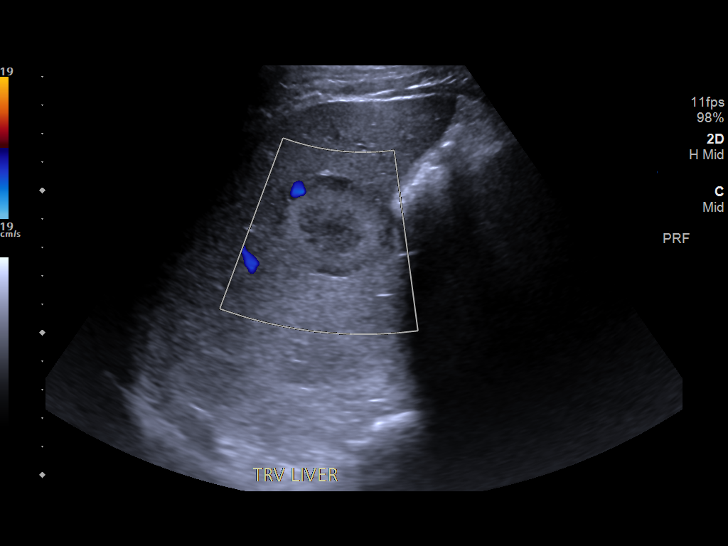
[im 7/20]
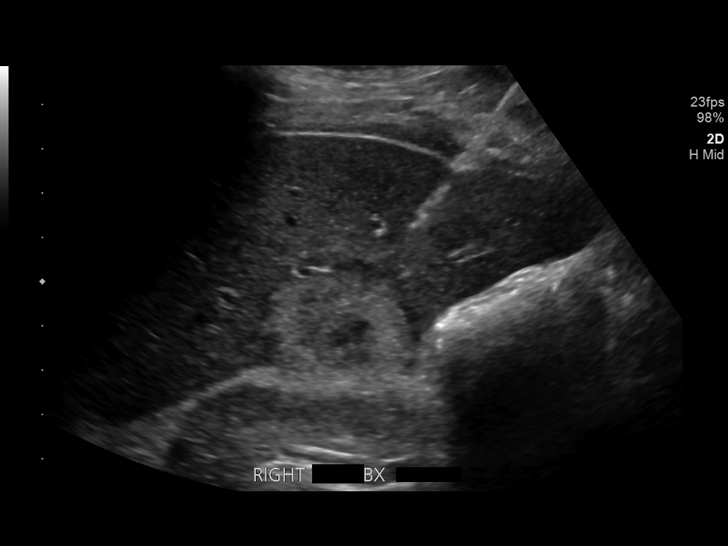
[im 9/20]
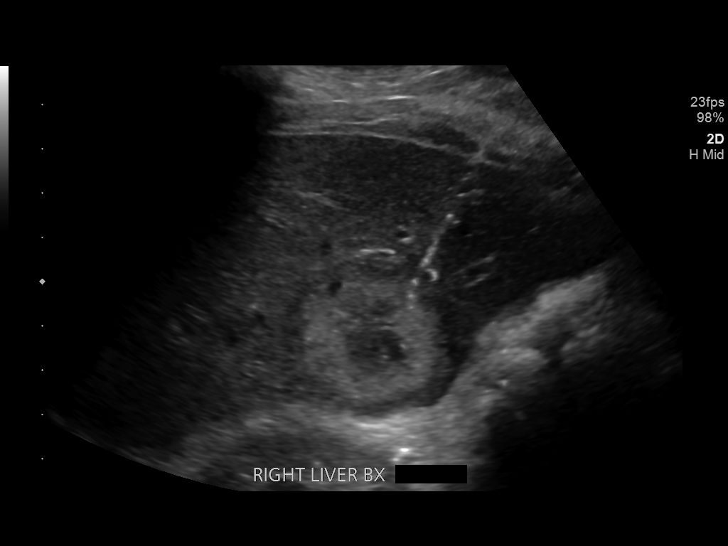
[im 11/20]
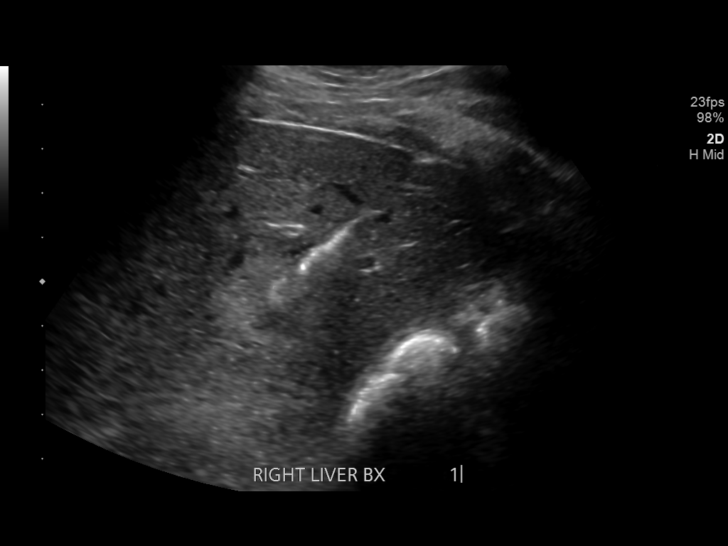
[im 12/20]
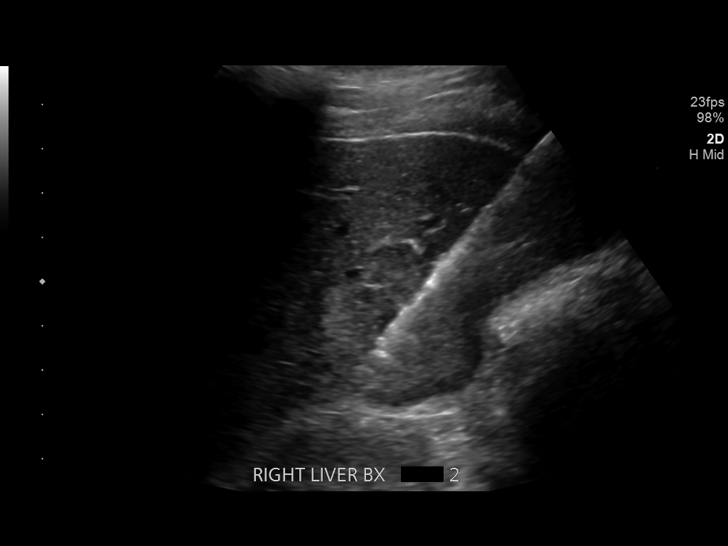
[im 14/20]
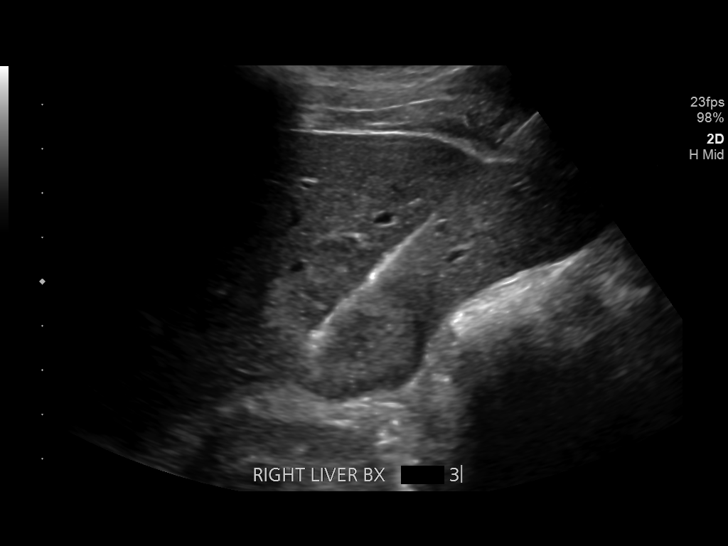
[im 15/20]
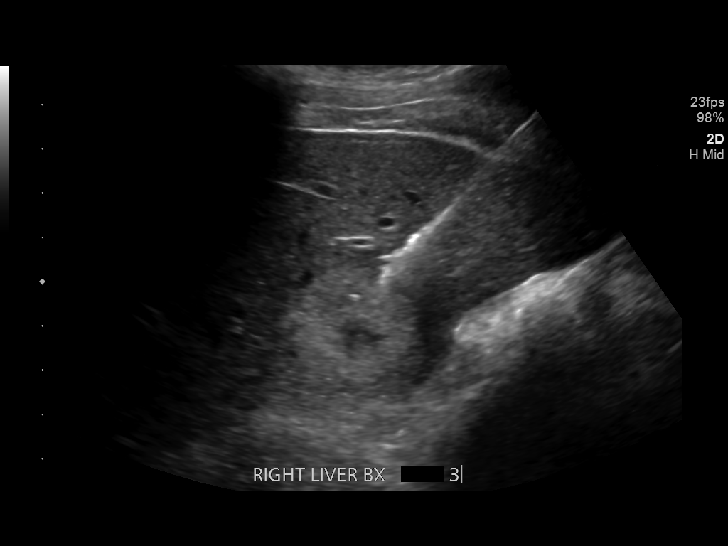
[im 17/20]
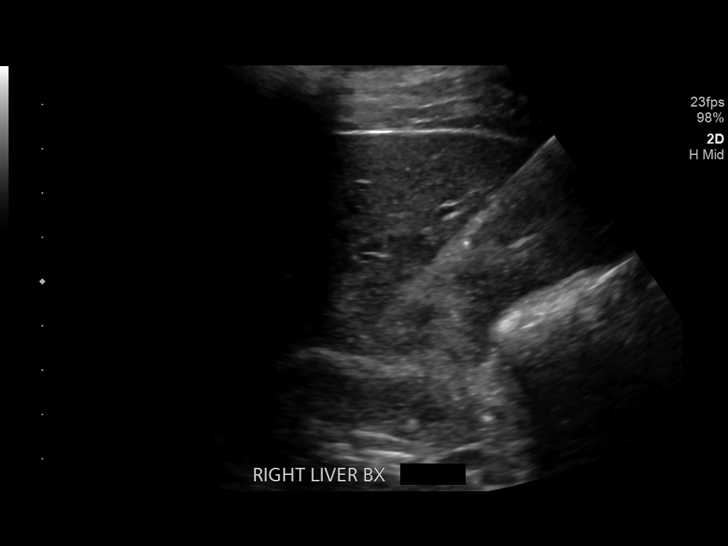
[im 18/20]
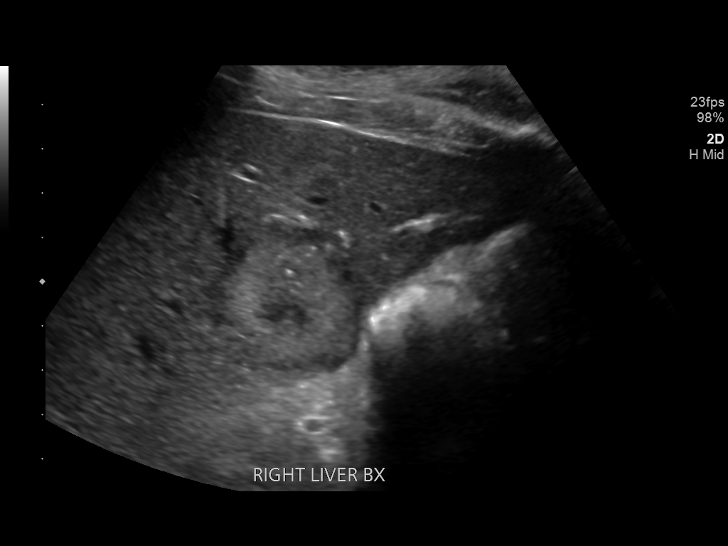
[im 20/20]
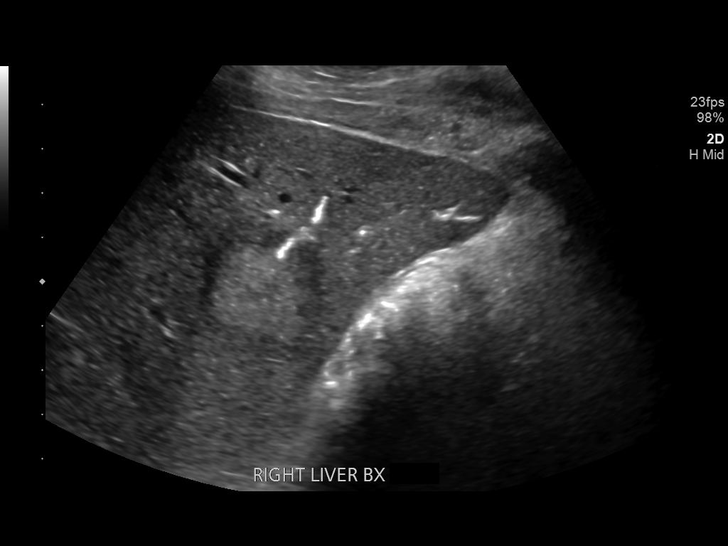

[13 of 20 positions shown; findings below may reference images not displayed]

EXAM:
ULTRASOUND-GUIDED LIVER LESION BIOPSY

MEDICATIONS:
None.

ANESTHESIA/SEDATION:
Moderate (conscious) sedation was employed during this procedure. A
total of Versed 2.0 mg and Fentanyl 100 mcg was administered
intravenously.

Moderate Sedation Time: 18 minutes. The patient's level of
consciousness and vital signs were monitored continuously by
radiology nursing throughout the procedure under my direct
supervision.

FLUOROSCOPY TIME:  None

COMPLICATIONS:
None immediate.

PROCEDURE:
Informed written consent was obtained from the patient after a
thorough discussion of the procedural risks, benefits and
alternatives. All questions were addressed. A timeout was performed
prior to the initiation of the procedure.

Liver was evaluated with ultrasound. Solitary lesion in the inferior
right hepatic lobe was identified and targeted. Right side of the
abdomen was prepped with chlorhexidine and a sterile field was
created. Skin and soft tissues were anesthetized using 1% lidocaine.
Small incision was made. Using ultrasound guidance, 17 gauge coaxial
needle was directed into the right hepatic lobe and the hepatic
lesion. Total of 4 core biopsies were obtained with an 18 gauge core
device. Specimens placed in formalin. 17 gauge coaxial needle was
removed without complication. Bandage placed over the puncture site.
FINDINGS: Slightly hyperechoic lesion in the inferior right hepatic lobe with
central hypoechogenicity. Total of 4 core biopsies were obtained
from this lesion. Specimens placed in formalin. No immediate
bleeding or complication.
IMPRESSION: Successful ultrasound-guided core biopsy of the right hepatic
lesion.

## 2020-05-12 MED ORDER — GELATIN ABSORBABLE 12-7 MM EX MISC
CUTANEOUS | Status: AC
  Filled 2020-05-12: qty 1

## 2020-05-12 MED ORDER — LIDOCAINE HCL (PF) 1 % IJ SOLN
INTRAMUSCULAR | Status: AC | PRN
  Administered 2020-05-12: 10 mL via INTRADERMAL

## 2020-05-12 MED ORDER — HYDROCODONE-ACETAMINOPHEN 5-325 MG PO TABS: 1.0000 | ORAL_TABLET | ORAL | Status: DC | PRN

## 2020-05-12 MED ORDER — MIDAZOLAM HCL 2 MG/2ML IJ SOLN
INTRAMUSCULAR | Status: AC
  Filled 2020-05-12: qty 4

## 2020-05-12 MED ORDER — MIDAZOLAM HCL 2 MG/2ML IJ SOLN
INTRAMUSCULAR | Status: AC | PRN
  Administered 2020-05-12 (×2): 1 mg via INTRAVENOUS

## 2020-05-12 MED ORDER — FENTANYL CITRATE (PF) 100 MCG/2ML IJ SOLN
INTRAMUSCULAR | Status: AC
  Filled 2020-05-12: qty 2

## 2020-05-12 MED ORDER — SODIUM CHLORIDE 0.9 % IV SOLN: INTRAVENOUS | Status: DC

## 2020-05-12 MED ORDER — FENTANYL CITRATE (PF) 100 MCG/2ML IJ SOLN
INTRAMUSCULAR | Status: AC | PRN
  Administered 2020-05-12 (×2): 50 ug via INTRAVENOUS

## 2020-05-12 MED ORDER — LIDOCAINE HCL 1 % IJ SOLN
INTRAMUSCULAR | Status: AC
  Filled 2020-05-12: qty 20

## 2020-05-12 NOTE — Progress Notes (Signed)
Patient stable and transported to Radiation Oncology for treatment with no issues noted.

## 2020-05-12 NOTE — H&P (Signed)
Chief Complaint: Patient was seen in consultation today for liver lesion/biopsy.  Referring Physician(s): [name] (radiation oncology)  Supervising Physician: Markus Daft  Patient Status: [facility] - Out-pt  History of Present Illness: Albert Flowers is a 50 y.o. male with a past medical history of tongue base cancer, right tonsillar cancer, and diabetes mellitus. 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 In addition, was found to have tongue base cancer in 05/2018. His cancer is managed by Dr. Isidore Moos. He is currently undergoing radiation therapy as management. Follow-up PET revealed widespread bone metastases along with liver lesion concerning for metastatic disease.  NM PET 05/04/2020: 1. Widespread hypermetabolic bone metastases. 2. 3.5 cm necrotic hypermetabolic metastatic lesion in the right liver. 3. Hypermetabolic uptake in the right hilum associated with 2 hypermetabolic left lung nodules. Imaging appearance is concerning for metastatic disease. 4. Patchy/nodular ground-glass opacity in the right lung having a tree-in-bud configuration. This would be an atypical appearance for metastatic disease and infectious etiology is favored, potentially atypical. 5.  Aortic Atherosclerois (ICD10-170.0)  IR consulted by Dr. Isidore Moos for possible image-guided liver lesion biopsy. Patient awake and alert laying in bed with no complaints at this time. Denies fever, chills, chest pain, dyspnea, abdominal pain, or headache.   Past Medical History:  Diagnosis Date  . Diabetes mellitus without complication [facility])     Past Surgical History:  Procedure Laterality Date  . left neck dissection Left 05/04/2019   Left Neck Dissection by Dr. [name] at [facility].   . neck sugery     as a child "had a knot removed from neck" not sure which side.   . RIght neck dissection Right 06/16/2018   TORS and right neck dissection. Dr.  [name] at [facility]    Allergies: Penicillins  Medications: Prior to Admission medications   Medication Sig Start Date End Date Taking? Authorizing Provider  acetaminophen (TYLENOL) 500 MG tablet Take 500 mg by mouth every 6 (six) hours as needed. Patient not taking: No sig reported    [provider]  aspirin EC 81 MG tablet Take 81 mg by mouth daily. Patient not taking: No sig reported    [provider]  celecoxib (CELEBREX) 100 MG capsule Take 100 mg by mouth 2 (two) times daily. Patient not taking: No sig reported 05/26/19   [provider]  diphenhydrAMINE (BENADRYL) 25 MG tablet Take 25 mg by mouth at bedtime.    [provider]  gabapentin (NEURONTIN) 300 MG capsule Take 1 capsule (300 mg total) by mouth 3 (three) times daily. 05/06/20   [name], MD  gabapentin (NEURONTIN) 300 MG capsule Take 900 mg by mouth 3 (three) times daily.    [provider]  metFORMIN (GLUCOPHAGE) 1000 MG tablet Take 1,000 mg by mouth 2 (two) times daily with a meal.  Patient not taking: No sig reported    [provider]  naproxen (NAPROSYN) 500 MG tablet Take 500 mg by mouth 2 (two) times daily as needed. Patient not taking: No sig reported 09/22/19   [provider]  nystatin (MYCOSTATIN) 100000 UNIT/ML suspension Take 5 mLs (500,000 Units total) by mouth 4 (four) times daily. Swish and spit or swallow until better. Patient not taking: No sig reported 07/29/19   Harle Stanford., PA-C  ondansetron (ZOFRAN ODT) 8 MG disintegrating tablet Take 1 tablet (8 mg total) by mouth every 8 (eight) hours as needed for nausea or vomiting. 05/06/20  [name], MD  rosuvastatin (CRESTOR) 20 MG tablet  03/18/18   [provider]  sodium fluoride (PREVIDENT 5000 PLUS) 1.1 % CREA dental cream Patient to use as directed on a toothbrush or alternatively in his fluoride trays as instructed.  Patient is to spit out excess and not swallow.  Repeat  nightly. Patient not taking: No sig reported 06/01/19   Lenn Cal, DDS  sucralfate (CARAFATE) 1 g tablet Dissolve 1 tablet in 10 mL H20 and swallow 20 min prior to meals and bedtime. Patient not taking: No sig reported 07/06/19   [name], MD     History reviewed. No pertinent family history.  Social History   Socioeconomic History  . Marital status: Married    Spouse name: Not on file  . Number of children: 4  . Years of education: Not on file  . Highest education level: Not on file  Occupational History  . Not on file  Tobacco Use  . Smoking status: Never Smoker  . Smokeless tobacco: Never Used  Vaping Use  . Vaping Use: Never used  Substance and Sexual Activity  . Alcohol use: Yes    Comment: occasional  . Drug use: Never  . Sexual activity: Not on file  Other Topics Concern  . Not on file  Social History Narrative  . Not on file   Social Determinants of Health   Financial Resource Strain: Not on file  Food Insecurity: Not on file  Transportation Needs: Not on file  Physical Activity: Not on file  Stress: Not on file  Social Connections: Not on file     Review of Systems: A 12 point ROS discussed and pertinent positives are indicated in the HPI above.  All other systems are negative.  Review of Systems  Constitutional: Negative for chills and fever.  Respiratory: Negative for shortness of breath and wheezing.   Cardiovascular: Negative for chest pain and palpitations.  Neurological: Negative for headaches.  Psychiatric/Behavioral: Negative for behavioral problems and confusion.    Vital Signs: There were no vitals taken for this visit.  Physical Exam Vitals and nursing note reviewed.  Constitutional:      General: He is not in acute distress.    Appearance: Normal appearance.  Cardiovascular:     Rate and Rhythm: Normal rate and regular rhythm.     Heart sounds: Normal heart sounds. No murmur heard.   Pulmonary:     Effort: Pulmonary  effort is normal. No respiratory distress.     Breath sounds: Normal breath sounds. No wheezing.  Abdominal:     Palpations: Abdomen is soft.     Tenderness: There is no abdominal tenderness.  Skin:    General: Skin is warm and dry.  Neurological:     Mental Status: He is alert and oriented to person, place, and time.      MD Evaluation Airway: WNL Heart: WNL Abdomen: WNL Chest/ Lungs: WNL ASA  Classification: 3 Mallampati/Airway Score: Two   Imaging: MR CERVICAL SPINE W WO CONTRAST  Result Date: 04/26/2020 CLINICAL DATA:  Metastatic head neck cancer. EXAM: MRI CERVICAL SPINE WITHOUT AND WITH CONTRAST TECHNIQUE: Multiplanar and multiecho pulse sequences of the cervical spine, to include the craniocervical junction and cervicothoracic junction, were obtained without and with intravenous contrast. CONTRAST:  28mL MULTIHANCE GADOBENATE DIMEGLUMINE 529 MG/ML IV SOLN COMPARISON:  None. FINDINGS: Alignment: Mild retrolisthesis C4-5. Vertebrae: Diffuse fatty bone marrow may represent radiation change. No metastatic disease in the cervical spine. No fracture.  Cord: Normal signal and morphology. Posterior Fossa, vertebral arteries, paraspinal tissues: Negative Disc levels: C2-3: Right foraminal disc protrusion with right foraminal stenosis C3-4: Left foraminal narrowing due to spurring. C4-5: Central disc protrusion and associated spurring right greater than left. Moderate right foraminal stenosis. Cord flattening and mild spinal stenosis right greater than left. Mild left foraminal narrowing. C5-6: Central disc protrusion with cord flattening and mild spinal stenosis. Neural foramina patent bilaterally. C6-7: Shallow central disc protrusion.  Negative for stenosis C7-T1: Negative IMPRESSION: Negative for metastatic disease cervical spine Multilevel degenerative change as detailed above. Electronically Signed   By: Franchot Gallo M.D.   On: 04/26/2020 12:15   MR THORACIC SPINE W WO  CONTRAST  Result Date: 04/26/2020 CLINICAL DATA:  Head neck cancer with known metastatic disease. EXAM: MRI THORACIC WITHOUT AND WITH CONTRAST TECHNIQUE: Multiplanar and multiecho pulse sequences of the thoracic spine were obtained without and with intravenous contrast. CONTRAST:  34mL MULTIHANCE GADOBENATE DIMEGLUMINE 529 MG/ML IV SOLN COMPARISON:  Thoracic MRI 04/20/2020 FINDINGS: Alignment:  Normal Vertebrae: Multiple enhancing metastatic deposits in the thoracic spine involving T7 vertebral body, much of the T8 vertebral body, T9, T10, and T11 vertebral bodies. The lesions show mild interval growth since the prior study 6 days ago. There is a mild pathologic fracture of T8 which is unchanged. No epidural tumor or cord compression. Cord:  Normal signal and morphology Paraspinal and other soft tissues: Negative Disc levels: Mild thoracic disc degeneration. No significant stenosis. Small central disc protrusion T4-5, T5-6, T6-7. Small central disc protrusion T8-9. IMPRESSION: Metastatic disease in the thoracic spine with mild progression since 6 days ago. Mild pathologic fracture T8 No epidural tumor and no cord compression. Electronically Signed   By: Franchot Gallo M.D.   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 EXAM: MRI LUMBAR SPINE WITHOUT AND WITH CONTRAST TECHNIQUE: Multiplanar and multiecho pulse sequences of the lumbar spine were obtained without and with intravenous contrast. CONTRAST:  36mL MULTIHANCE GADOBENATE DIMEGLUMINE 529 MG/ML IV SOLN COMPARISON:  None. FINDINGS: Segmentation:  Normal Alignment:  Normal Vertebrae: Multiple enhancing bone marrow lesions compatible with widespread metastatic disease. This involves L1 vertebral body, L2, L3, L5, as well as the sacrum and left iliac bone. No epidural tumor. No fracture. Conus medullaris and cauda equina: Conus extends to the L1-2  level. Conus and cauda equina appear normal. Paraspinal and other soft tissues: Negative for retroperitoneal adenopathy. Disc levels: No significant lumbar spinal stenosis or neural impingement. Shallow central disc protrusion at L5-S1 with annular tear. IMPRESSION: Extensive metastatic disease throughout the lumbar spine as well as the sacrum and left iliac bone. No pathologic fracture or epidural tumor. Electronically Signed   By: Franchot Gallo M.D.   On: 04/26/2020 12:05   NM PET Image Restag (PS) Skull Base To Thigh  Result Date: 05/04/2020 CLINICAL DATA:  Subsequent treatment strategy for head and neck cancer. EXAM: NUCLEAR MEDICINE PET SKULL BASE TO THIGH TECHNIQUE: 7.6 mCi F-18 FDG was injected intravenously. Full-ring PET imaging was performed from the skull base to thigh after the radiotracer. CT data was obtained and used for attenuation correction and anatomic localization. Fasting blood glucose:  mg/dl COMPARISON:  05/28/2018 FINDINGS: Mediastinal blood pool activity: SUV max 2.4 Liver activity: SUV max NA NECK: No hypermetabolic lymph nodes in the neck. Incidental CT findings: none CHEST: Hypermetabolic lymphadenopathy in the right hilum identified with SUV  max = 6.2. Lymph nodes in the subcarinal station show low level uptake. 14 mm left perifissural nodule on 84/4 shows low level hypermetabolism with SUV max = 2.5. Small focus of FDG accumulation in the left lower lobe may reflect misregistration to a 5 mm left lower lobe nodule on 96/4. There is patchy clustered ill-defined nodularity in the right upper and lower lobes having a somewhat tree-in-bud configuration suggesting infection. Incidental CT findings: none ABDOMEN/PELVIS: 3.5 cm necrotic inferior right liver mass (image 132/series 4) demonstrates SUV max = 11.5. No hypermetabolic lymphadenopathy. Incidental CT findings: There is abdominal aortic atherosclerosis without aneurysm. SKELETON: Diffuse uptake in the thoracolumbar spine is  suspicious for metastatic involvement. New hypermetabolic right humeral head lesion visible on image 63/4 shows SUV max = 10.1. Left sternal lesion demonstrates SUV max = 6.1. Mixed lytic and sclerotic lesion in the L3 vertebral body demonstrates SUV max = 10.8. 3.7 cm right sacral lesion demonstrates SUV max = 12.1. Numerous additional hypermetabolic bone lesions are seen in the bony pelvis. Incidental CT findings: None. IMPRESSION: 1. Widespread hypermetabolic bone metastases. 2. 3.5 cm necrotic hypermetabolic metastatic lesion in the right liver. 3. Hypermetabolic uptake in the right hilum associated with 2 hypermetabolic left lung nodules. Imaging appearance is concerning for metastatic disease. 4. Patchy/nodular ground-glass opacity in the right lung having a tree-in-bud configuration. This would be an atypical appearance for metastatic disease and infectious etiology is favored, potentially atypical. 5.  Aortic Atherosclerois (ICD10-170.0) Electronically Signed   By: Misty Stanley M.D.   On: 05/04/2020 20:27    Labs:  CBC: Recent Labs    07/29/19 1316 05/11/20 1201  WBC 6.1 5.0  HGB 14.2 12.5*  HCT 42.5 37.9*  PLT 289 339    COAGS: No results for input(s): INR, APTT in the last 8760 hours.  BMP: Recent Labs    07/29/19 1316 11/12/19 1144 05/11/20 1201  NA 139  --  136  K 4.4  --  4.3  CL 99  --  100  CO2 28  --  28  GLUCOSE 158*  --  106*  BUN 20 9 12   CALCIUM 9.9  --  9.5  CREATININE 0.99 0.84 0.94  GFRNONAA >60 >60 >60  GFRAA >60 >60  --     LIVER FUNCTION TESTS: Recent Labs    07/29/19 1316 05/11/20 1201  BILITOT 0.4 0.2*  AST 17 24  ALT 15 8  ALKPHOS 103 134*  PROT 7.6 7.5  ALBUMIN 3.7 3.9     Assessment and Plan:  Liver lesion (hypermetabolic on NM PET 2/42/3536) in setting of known SCC of right tonsil and tongue base cancer and widespread bone metastases, concerning for metastatic disease Plan for image-guided liver lesion biopsy today in IR. Patient  is NPO. Afebrile. He does not take blood thinners. INR pending.  Risks and benefits discussed with the patient including, but not limited to bleeding, infection, damage to adjacent structures or low yield requiring additional tests. All of the patient's questions were answered, patient is agreeable to proceed. Consent signed and in chart.   Thank you for this interesting consult.  I greatly enjoyed meeting Albert Flowers and look forward to participating in their care.  A copy of this report was sent to the requesting provider on this date.  Electronically Signed: Earley Abide, PA-C 05/12/2020, 11:43 AM   I spent a total of 30 Minutes in face to face in clinical consultation, greater than 50% of which was counseling/coordinating care for liver  lesion/biopsy.

## 2020-05-12 NOTE — Procedures (Signed)
Interventional Radiology Procedure:   Indications: History of squamous cell carcinoma - base of tongue. Bone lesions and a liver lesion  Procedure: US guided liver lesion biopsy  Findings: 4 cores from right hepatic lesion  Complications: none     EBL: less than 10 ml  Plan: Bedrest 3 hours   Albert Flowers R. Anselm Pancoast, MD  Pager: [phone]

## 2020-05-12 NOTE — Discharge Instructions (Signed)
Please call Interventional Radiology clinic [phone] with any questions or concerns.  You may remove your dressing and shower tomorrow.   Liver Biopsy, Care After These instructions give you information on caring for yourself after your procedure. Your doctor may also give you more specific instructions. Call your doctor if you have any problems or questions after your procedure. What can I expect after the procedure? After the procedure, it is common to have:  Pain and soreness where the biopsy was done.  Bruising around the area where the biopsy was done.  Sleepiness and be tired for a few days. Follow these instructions at home: Medicines  Take over-the-counter and prescription medicines only as told by your doctor.  If you were prescribed an antibiotic medicine, take it as told by your doctor. Do not stop taking the antibiotic even if you start to feel better.  Do not take medicines such as aspirin and ibuprofen. These medicines can thin your blood. Do not take these medicines unless your doctor tells you to take them.  If you are taking prescription pain medicine, take actions to prevent or treat constipation. Your doctor may recommend that you: ? Drink enough fluid to keep your pee (urine) clear or pale yellow. ? Take over-the-counter or prescription medicines. ? Eat foods that are high in fiber, such as fresh fruits and vegetables, whole grains, and beans. ? Limit foods that are high in fat and processed sugars, such as fried and sweet foods. Caring for your cut  Follow instructions from your doctor about how to take care of your cuts from surgery (incisions). Make sure you: ? Wash your hands with soap and water before you change your bandage (dressing). If you cannot use soap and water, use hand sanitizer. ? Change your bandage as told by your doctor. ? Leave stitches (sutures), skin glue, or skin tape (adhesive) strips in place. They may need to stay in place for 2  weeks or longer. If tape strips get loose and curl up, you may trim the loose edges. Do not remove tape strips completely unless your doctor says it is okay.  Check your cuts every day for signs of infection. Check for: ? Redness, swelling, or more pain. ? Fluid or blood. ? Pus or a bad smell. ? Warmth.  Do not take baths, swim, or use a hot tub until your doctor says it is okay to do so. Activity  Rest at home for 1-2 days or as told by your doctor. ? Avoid sitting for a long time without moving. Get up to take short walks every 1-2 hours.  Return to your normal activities as told by your doctor. Ask what activities are safe for you.  Do not do these things in the first 24 hours: ? Drive. ? Use machinery. ? Take a bath or shower.  Do not lift more than 10 pounds (4.5 kg) or play contact sports for the first 2 weeks.   General instructions  Do not drink alcohol in the first week after the procedure.  Have someone stay with you for at least 24 hours after the procedure.  Get your test results. Ask your doctor or the department that is doing the test: ? When will my results be ready? ? How will I get my results? ? What are my treatment options? ? What other tests do I need? ? What are my next steps?  Keep all follow-up visits as told by your doctor. This is important.   Contact   a doctor if:  A cut bleeds and leaves more than just a small spot of blood.  A cut is red, puffs up (swells), or hurts more than before.  Fluid or something else comes from a cut.  A cut smells bad.  You have a fever or chills. Get help right away if:  You have swelling, bloating, or pain in your belly (abdomen).  You get dizzy or faint.  You have a rash.  You feel sick to your stomach (nauseous) or throw up (vomit).  You have trouble breathing, feel short of breath, or feel faint.  Your chest hurts.  You have problems talking or seeing.  You have trouble with your balance or moving  your arms or legs. Summary  After the procedure, it is common to have pain, soreness, bruising, and tiredness.  Your doctor will tell you how to take care of yourself at home. Change your bandage, take your medicines, and limit your activities as told by your doctor.  Call your doctor if you have symptoms of infection. Get help right away if your belly swells, your cut bleeds a lot, or you have trouble talking or breathing. This information is not intended to replace advice given to you by your health care provider. Make sure you discuss any questions you have with your health care provider. Document Revised: 03/14/2017 Document Reviewed: 03/15/2017 Elsevier Patient Education  2021 Elsevier Inc.   Moderate Conscious Sedation, Adult, Care After This sheet gives you information about how to care for yourself after your procedure. Your health care provider may also give you more specific instructions. If you have problems or questions, contact your health care provider. What can I expect after the procedure? After the procedure, it is common to have:  Sleepiness for several hours.  Impaired judgment for several hours.  Difficulty with balance.  Vomiting if you eat too soon. Follow these instructions at home: For the time period you were told by your health care provider:  Rest.  Do not participate in activities where you could fall or become injured.  Do not drive or use machinery.  Do not drink alcohol.  Do not take sleeping pills or medicines that cause drowsiness.  Do not make important decisions or sign legal documents.  Do not take care of children on your own.      Eating and drinking  Follow the diet recommended by your health care provider.  Drink enough fluid to keep your urine pale yellow.  If you vomit: ? Drink water, juice, or soup when you can drink without vomiting. ? Make sure you have little or no nausea before eating solid foods.   General  instructions  Take over-the-counter and prescription medicines only as told by your health care provider.  Have a responsible adult stay with you for the time you are told. It is important to have someone help care for you until you are awake and alert.  Do not smoke.  Keep all follow-up visits as told by your health care provider. This is important. Contact a health care provider if:  You are still sleepy or having trouble with balance after 24 hours.  You feel light-headed.  You keep feeling nauseous or you keep vomiting.  You develop a rash.  You have a fever.  You have redness or swelling around the IV site. Get help right away if:  You have trouble breathing.  You have new-onset confusion at home. Summary  After the procedure, it is   common to feel sleepy, have impaired judgment, or feel nauseous if you eat too soon.  Rest after you get home. Know the things you should not do after the procedure.  Follow the diet recommended by your health care provider and drink enough fluid to keep your urine pale yellow.  Get help right away if you have trouble breathing or new-onset confusion at home. This information is not intended to replace advice given to you by your health care provider. Make sure you discuss any questions you have with your health care provider. Document Revised: 07/03/2019 Document Reviewed: 01/29/2019 Elsevier Patient Education  2021 Elsevier Inc.  

## 2020-05-13 ENCOUNTER — Ambulatory Visit: Payer: Managed Care, Other (non HMO)

## 2020-05-13 ENCOUNTER — Other Ambulatory Visit: Payer: Self-pay

## 2020-05-13 ENCOUNTER — Ambulatory Visit
Admission: RE | Admit: 2020-05-13 | Discharge: 2020-05-13 | Disposition: A | Discharge status: Home / Self Care | Payer: Managed Care, Other (non HMO) | Source: Ambulatory Visit | Attending: Radiation Oncology | Admitting: Radiation Oncology

## 2020-05-13 DIAGNOSIS — Z510 Encounter for antineoplastic radiation therapy: Secondary | ICD-10-CM | POA: Diagnosis not present

## 2020-05-16 ENCOUNTER — Ambulatory Visit
Admission: RE | Admit: 2020-05-16 | Discharge: 2020-05-16 | Disposition: A | Discharge status: Home / Self Care | Payer: Managed Care, Other (non HMO) | Source: Ambulatory Visit | Attending: Radiation Oncology | Admitting: Radiation Oncology

## 2020-05-16 ENCOUNTER — Other Ambulatory Visit: Payer: Self-pay

## 2020-05-16 DIAGNOSIS — Z510 Encounter for antineoplastic radiation therapy: Secondary | ICD-10-CM | POA: Diagnosis not present

## 2020-05-16 LAB — SURGICAL PATHOLOGY

## 2020-05-17 ENCOUNTER — Ambulatory Visit (INDEPENDENT_AMBULATORY_CARE_PROVIDER_SITE_OTHER): Payer: Managed Care, Other (non HMO) | Admitting: Orthopaedic Surgery

## 2020-05-17 ENCOUNTER — Ambulatory Visit
Admission: RE | Admit: 2020-05-17 | Discharge: 2020-05-17 | Disposition: A | Discharge status: Home / Self Care | Payer: Managed Care, Other (non HMO) | Source: Ambulatory Visit | Attending: Radiation Oncology | Admitting: Radiation Oncology

## 2020-05-17 ENCOUNTER — Encounter: Payer: Self-pay | Admitting: Orthopaedic Surgery

## 2020-05-17 ENCOUNTER — Ambulatory Visit (INDEPENDENT_AMBULATORY_CARE_PROVIDER_SITE_OTHER): Payer: Managed Care, Other (non HMO)

## 2020-05-17 ENCOUNTER — Other Ambulatory Visit: Payer: Self-pay

## 2020-05-17 ENCOUNTER — Ambulatory Visit: Payer: Self-pay

## 2020-05-17 DIAGNOSIS — Z791 Long term (current) use of non-steroidal anti-inflammatories (NSAID): Secondary | ICD-10-CM | POA: Diagnosis not present

## 2020-05-17 DIAGNOSIS — Z510 Encounter for antineoplastic radiation therapy: Secondary | ICD-10-CM | POA: Diagnosis not present

## 2020-05-17 DIAGNOSIS — C787 Secondary malignant neoplasm of liver and intrahepatic bile duct: Secondary | ICD-10-CM | POA: Insufficient documentation

## 2020-05-17 DIAGNOSIS — C109 Malignant neoplasm of oropharynx, unspecified: Secondary | ICD-10-CM | POA: Diagnosis not present

## 2020-05-17 DIAGNOSIS — A630 Anogenital (venereal) warts: Secondary | ICD-10-CM | POA: Diagnosis not present

## 2020-05-17 DIAGNOSIS — C7951 Secondary malignant neoplasm of bone: Secondary | ICD-10-CM | POA: Insufficient documentation

## 2020-05-17 DIAGNOSIS — Z1329 Encounter for screening for other suspected endocrine disorder: Secondary | ICD-10-CM | POA: Diagnosis present

## 2020-05-17 DIAGNOSIS — Z923 Personal history of irradiation: Secondary | ICD-10-CM | POA: Diagnosis not present

## 2020-05-17 DIAGNOSIS — Z7984 Long term (current) use of oral hypoglycemic drugs: Secondary | ICD-10-CM | POA: Diagnosis not present

## 2020-05-17 DIAGNOSIS — E119 Type 2 diabetes mellitus without complications: Secondary | ICD-10-CM | POA: Diagnosis not present

## 2020-05-17 DIAGNOSIS — C090 Malignant neoplasm of tonsillar fossa: Secondary | ICD-10-CM | POA: Diagnosis not present

## 2020-05-17 DIAGNOSIS — M898X5 Other specified disorders of bone, thigh: Secondary | ICD-10-CM

## 2020-05-17 DIAGNOSIS — Z7982 Long term (current) use of aspirin: Secondary | ICD-10-CM | POA: Diagnosis not present

## 2020-05-17 MED ORDER — METHOCARBAMOL 750 MG PO TABS: 750.0000 mg | ORAL_TABLET | Freq: Four times a day (QID) | ORAL | 1 refills | Status: DC | PRN

## 2020-05-17 NOTE — Progress Notes (Signed)
Oncology Nurse Navigator Documentation  I spoke with Suanne Marker with Pathology at [facility]. I requested PD-L1 testing be added to Mr. Tullier liver biopsy from 05/12/20. She has put in the request for the add-on test to Dr. Saralyn Pilar. I will follow for results.   Harlow Asa RN, BSN, OCN Head & Neck Oncology Nurse [place] at [facility] Phone # [phone]  Fax # [phone]

## 2020-05-17 NOTE — Progress Notes (Signed)
Office Visit Note   Patient: Albert Flowers           Date of Birth: [birth year]           MRN: [id number] Visit Date: 05/17/2020              Requested by: [name], MD [address] [place],  [place] [address] PCP: [name], MD   Assessment & Plan: Visit Diagnoses:  1. Pain in femur     Plan: I did give the patient and his wife reassurance that there was no surgical intervention needed for either hip or thigh.  We did give him a copy of the x-rays to take to [name].  There is no intervention surgically needed for the pelvic ilium lesion.  All question concerns were answered and addressed.  Follow-Up Instructions: Return if symptoms worsen or fail to improve.   Orders:  Orders Placed This Encounter  Procedures  . XR FEMUR MIN 2 VIEWS LEFT  . XR FEMUR, MIN 2 VIEWS RIGHT   Meds ordered this encounter  Medications  . methocarbamol (ROBAXIN) 750 MG tablet    Sig: Take 1 tablet (750 mg total) by mouth every 6 (six) hours as needed for muscle spasms.    Dispense:  40 tablet    Refill:  1      Procedures: No procedures performed   Clinical Data: No additional findings.   Subjective: Chief Complaint  Patient presents with  . Left Leg - Pain  . 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  He had a PET scan in February and it showed uptake in the left ilium as well as his lumbar spine and other places in his body.  He actually has an appointment next month at [name] going to [place].  He has been dealing with radiation treatment on a lesion in the spine at L3.  He has been having some low back pain and sciatic type symptoms.  There is been a little bit of pain deep in his thigh but that is more medial.  He denies any groin pain.  He denies any pain at his knees.  His wife is with him today.  HPI  Review of Systems There is  currently listed no shortness of breath, fever, chills, nausea, vomiting.  Objective: Vital Signs: There were no vitals taken for this visit.  Physical Exam He is alert and oriented x3 and in no acute distress.  He is cachectic. Ortho Exam Examination of both hips show the move smoothly and fluidly and there is no pain in the thigh knee or leg on either side.  There is no pain to deep palpation along the long bones on either femur or tibia. Specialty Comments:  No specialty comments available.  Imaging: XR FEMUR MIN 2 VIEWS LEFT  Result Date: 05/17/2020 An AP and lateral left femur shows no lytic lesions from the hip down to the knee and no evidence of cortical destruction or worrisome features given the patient's history of metastatic cancer.  XR FEMUR, MIN 2 VIEWS RIGHT  Result Date: 05/17/2020 2 views of the right femur showed no worrisome lytic lesions or cortical destruction from the hip down to the knee given the patient's history of metastatic cancer.    PMFS History: Patient Active Problem List   Diagnosis Date Noted  . Bone metastases ([place]) 05/09/2020  .  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 Diabetes mellitus without complication ([place])     No family history on file.  Past Surgical History:  Procedure Laterality Date  . left neck dissection Left 05/04/2019   Left Neck Dissection by Dr. [name] at [name].   . neck sugery     as a child "had a knot removed from neck" not sure which side.   . RIght neck dissection Right 06/16/2018   TORS and right neck dissection. Dr. [name] at [name] History  . Not on file  Tobacco Use  . Smoking status: Never Smoker  . Smokeless tobacco: Never Used  Vaping Use  . Vaping Use: Never used  Substance and Sexual Activity  . Alcohol use: Yes     Comment: occasional  . Drug use: Never  . Sexual activity: Not on file

## 2020-05-18 ENCOUNTER — Ambulatory Visit
Admission: RE | Admit: 2020-05-18 | Discharge: 2020-05-18 | Disposition: A | Discharge status: Home / Self Care | Payer: Managed Care, Other (non HMO) | Source: Ambulatory Visit | Attending: Radiation Oncology | Admitting: Radiation Oncology

## 2020-05-18 ENCOUNTER — Inpatient Hospital Stay: Payer: Managed Care, Other (non HMO)

## 2020-05-18 ENCOUNTER — Encounter: Payer: Self-pay | Admitting: Hematology and Oncology

## 2020-05-18 ENCOUNTER — Inpatient Hospital Stay (HOSPITAL_BASED_OUTPATIENT_CLINIC_OR_DEPARTMENT_OTHER): Payer: Managed Care, Other (non HMO) | Admitting: Hematology and Oncology

## 2020-05-18 VITALS — BP 120/77 | HR 91 | Temp 97.9°F | Resp 18 | Ht 70.0 in | Wt 150.0 lb

## 2020-05-18 DIAGNOSIS — Z791 Long term (current) use of non-steroidal anti-inflammatories (NSAID): Secondary | ICD-10-CM | POA: Insufficient documentation

## 2020-05-18 DIAGNOSIS — C7951 Secondary malignant neoplasm of bone: Secondary | ICD-10-CM

## 2020-05-18 DIAGNOSIS — Z7982 Long term (current) use of aspirin: Secondary | ICD-10-CM | POA: Insufficient documentation

## 2020-05-18 DIAGNOSIS — G893 Neoplasm related pain (acute) (chronic): Secondary | ICD-10-CM

## 2020-05-18 DIAGNOSIS — Z923 Personal history of irradiation: Secondary | ICD-10-CM | POA: Insufficient documentation

## 2020-05-18 DIAGNOSIS — C090 Malignant neoplasm of tonsillar fossa: Secondary | ICD-10-CM | POA: Diagnosis not present

## 2020-05-18 DIAGNOSIS — E119 Type 2 diabetes mellitus without complications: Secondary | ICD-10-CM | POA: Insufficient documentation

## 2020-05-18 DIAGNOSIS — C109 Malignant neoplasm of oropharynx, unspecified: Secondary | ICD-10-CM | POA: Insufficient documentation

## 2020-05-18 DIAGNOSIS — Z7984 Long term (current) use of oral hypoglycemic drugs: Secondary | ICD-10-CM | POA: Insufficient documentation

## 2020-05-18 DIAGNOSIS — A630 Anogenital (venereal) warts: Secondary | ICD-10-CM | POA: Insufficient documentation

## 2020-05-18 DIAGNOSIS — C787 Secondary malignant neoplasm of liver and intrahepatic bile duct: Secondary | ICD-10-CM

## 2020-05-18 DIAGNOSIS — J189 Pneumonia, unspecified organism: Secondary | ICD-10-CM | POA: Diagnosis not present

## 2020-05-18 MED ORDER — AZITHROMYCIN 250 MG PO TABS
250.0000 mg | ORAL_TABLET | Freq: Every day | ORAL | Status: DC
Start: 2020-05-19 — End: 2020-05-19

## 2020-05-18 MED ORDER — AZITHROMYCIN 250 MG PO TABS: 500.0000 mg | ORAL_TABLET | Freq: Every day | ORAL | Status: DC

## 2020-05-18 NOTE — Progress Notes (Signed)
[name] NOTE  Patient Care Team: [name], MD as PCP - General (Family Medicine) [name], MD as Consulting Physician (Otolaryngology) [name], MD as Attending Physician (Radiation Oncology) Leota Sauers, RN (Inactive) as Registered Nurse (Oncology) Wynelle Beckmann, Melodie Bouillon, PT as Physical Therapist (Physical Therapy) Sharen Counter, CCC-SLP as Speech Language Pathologist (Speech Pathology) Karie Mainland, RD as Dietitian (Nutrition) Malmfelt, Stephani Police, RN as Oncology Nurse Navigator (Oncology)  CHIEF COMPLAINTS/PURPOSE OF CONSULTATION:   Metastatic SCC oropharynx, HPV positive.  ASSESSMENT & PLAN:  No problem-specific Assessment & Plan notes found for this encounter.  No orders of the defined types were placed in this encounter.  1. Metastatic HPV positive oropharyngeal cancer. We have reviewed PET CT images independently with patient and his wife today which demonstrated diffuse bony mets, liver disease and right hilar uptake. I have reviewed pertinent pathology results with patient as well. He is undergoing palliative radiation to the pelvis at this time, Pain is reasonably well controlled with radiation and gabapentin. I have discussed that this is metastatic and intent of treatment is not curative. We have discussed available options including chemoimmunotherapy depending on PDL1 expression vs immunotherapy only in high PDL1 expressors ( CPS greater than 20) vs chemotherapy alone vs clinical trial. Given his young age, decent PS and aggressive HPV positive biology, I agree it is reasonable to consider upfront clinical trial. If he is not a candidate for a clinical trial, then we will consider first line treatment based on CPS score/PDL1. We discussed that immunotherapy unfortunately doesn't have as robust activity in H and N cancer, as it did in lung cancer or other solid tumors. We havent dwelled into the details of chemo  immunotherapy but I discussed about cis/5 FU which is the back bone for chemotherapy in this setting.  2. Bone lesions, pain in femur. He has seen Dr Ninfa Linden from ortho who didn't recommend any surgical intervention at this time.  3. Pain well controlled with gabapentin and palliative radiation to the T-L spine and left hip.  4. Concern for atypical pneumonia on imaging. No major findings on exam Will give him a trial of z pak.  HISTORY OF PRESENTING ILLNESS:  Albert Flowers 50 y.o. male is here because of metastatic HPV positive BOT cancer  Chronology  In summary, patient presents with Dr. Constance Holster of ENT in late 03/2018 for evaluation of an enlarging right neck mass.  CT neck showed necrotic right Level II and III necrotic LN's, possibly from oropharyngeal oral cavity primary, but the study was limited due to streak artifacts.  He underwent FNA of the R Level II LN, which showed squamous cell carcinoma, p16+.  PET in 05/2018 showed asymmetric FDG uptake in the right tonsil/base of the tongue, likely the primary malignancy.  In addition, there were two FDG-avid R Level II LN's without evidence of contralateral cervical LN or metastatic disease.  Given the relatively low volume disease, Dr Maylon Peppers recommended upfront surgery, followed by adjuvant therapy based on the final pathology. He had TORS on 06/16/2018.  Pathology from the procedure showed: invasive p16-positive (HPV-related) squamous cell carcinoma, 1.4 cm, negative margins. Out of 24 biopsied lymph nodes, only one was positive for metastatic carcinoma (1/24).  This node was 4.5cm but with no extracapsular extension.  No LVSI and no PNI.  He underwent right neck dissection but not left neck dissection.  At postop follow up, they agreed to proceed with observation and reserve radiation therapy  for any possible future recurrence.  At follow up on 04/07/2019, Dr. [name] noted a new suspicious left upper neck lymph node. Neck CT performed that  day revealed: new heterogeneous 2.2 cm contralateral left level 2a lymph node suspicious for contralateral nodal metastasis; indeterminate mild interval increase in size of several left level 2a and 2b lymph nodes.  PET scan performed on 04/16/2019 showed: single hypermetabolic left cervical lymph node; mild asymmetric FDG uptake in left glossotonsillar sulcus; focal FDG uptake in L1 vertebral body without CT correlate; otherwise, no malignant-range FDG uptake elsewhere.  He underwent left tonsillectomy and left neck dissection on 05/04/2019 with pathology revealing: benign left tonsil. Out of a total of 25 biopsied lymph nodes, only one was positive for metastatic carcinoma but negative for extracapsular extension and positive for p16.  He was advised adjuvant radiation at this time. He completed radiation on 07/24/2019. He was seen for FU by Dr Isidore Moos in December when he was doing well. He then started complaining of back pain in February. This led to MRI and PET imaging.  04/26/2020 MRI showed extensive metastatic disease throughout the lumbar spine as well as the sacrum and left iliac bone. No pathologic fracture or epidural Tumor.  05/04/2020 PET CT showed widespread hypermetabolic bone mets. 3.5 cm necrotic hypermetabolic met lesion in right liver. Hypermetabolic uptake in right hilum associated with 2 hypermetabolic lung nodules. Patchy/nodular ground-glass opacity in the right lung having a tree-in-bud configuration. This would be an atypical appearance for metastatic disease and infectious etiology is favored, potentially Atypical.  05/12/2020, liver biopsy confirmed metastatic HPV positive SCC  REVIEW OF SYSTEMS:   Constitutional: Denies fevers, chills or abnormal night sweats Eyes: Denies blurriness of vision, double vision or watery eyes Ears, nose, mouth, throat, and face: Denies mucositis or sore throat Respiratory: Denies cough, dyspnea or wheezes Cardiovascular: Denies palpitation,  chest discomfort or lower extremity swelling Gastrointestinal:  Denies nausea, heartburn or change in bowel habits Skin: Denies abnormal skin rashes Lymphatics: Denies new lymphadenopathy or easy bruising Neurological:Denies numbness, tingling or new weaknesses Behavioral/Psych: Mood is stable, no new changes  All other systems were reviewed with the patient and are negative.  MEDICAL HISTORY:  Past Medical History:  Diagnosis Date  . Diabetes mellitus without complication ([place])     SURGICAL HISTORY: Past Surgical History:  Procedure Laterality Date  . left neck dissection Left 05/04/2019   Left Neck Dissection by Dr. [name] at [facility].   . neck sugery     as a child "had a knot removed from neck" not sure which side.   . RIght neck dissection Right 06/16/2018   TORS and right neck dissection. Dr. [name] at [place]: Social History   Socioeconomic History  . Marital status: Married    Spouse name: Not on file  . Number of children: 4  . Years of education: Not on file  . Highest education level: Not on file  Occupational History  . Not on file  Tobacco Use  . Smoking status: Never Smoker  . Smokeless tobacco: Never Used  Vaping Use  . Vaping Use: Never used  Substance and Sexual Activity  . Alcohol use: Yes    Comment: occasional  . Drug use: Never  . Sexual activity: Not on file  Other Topics Concern  . Not on file  Social History Narrative  . Not on file   Social Determinants of Health   Financial Resource Strain: Not on file  Food Insecurity:  Not on file  Transportation Needs: Not on file  Physical Activity: Not on file  Stress: Not on file  Social Connections: Not on file  Intimate Partner Violence: Not on file    FAMILY HISTORY: No family history on file.  ALLERGIES:  is allergic to penicillins.  MEDICATIONS:  Current Outpatient Medications  Medication Sig Dispense Refill  . acetaminophen (TYLENOL) 500 MG tablet Take 500 mg by  mouth every 6 (six) hours as needed. (Patient not taking: No sig reported)    . aspirin EC 81 MG tablet Take 81 mg by mouth daily. (Patient not taking: No sig reported)    . celecoxib (CELEBREX) 100 MG capsule Take 100 mg by mouth 2 (two) times daily. (Patient not taking: No sig reported)    . diphenhydrAMINE (BENADRYL) 25 MG tablet Take 25 mg by mouth at bedtime.    . gabapentin (NEURONTIN) 300 MG capsule Take 900 mg by mouth 3 (three) times daily.    . metFORMIN (GLUCOPHAGE) 1000 MG tablet Take 1,000 mg by mouth 2 (two) times daily with a meal.  (Patient not taking: No sig reported)    . methocarbamol (ROBAXIN) 750 MG tablet Take 1 tablet (750 mg total) by mouth every 6 (six) hours as needed for muscle spasms. 40 tablet 1  . naproxen (NAPROSYN) 500 MG tablet Take 500 mg by mouth 2 (two) times daily as needed. (Patient not taking: No sig reported)    . nystatin (MYCOSTATIN) 100000 UNIT/ML suspension Take 5 mLs (500,000 Units total) by mouth 4 (four) times daily. Swish and spit or swallow until better. (Patient not taking: No sig reported) 240 mL 0  . ondansetron (ZOFRAN ODT) 8 MG disintegrating tablet Take 1 tablet (8 mg total) by mouth every 8 (eight) hours as needed for nausea or vomiting. 30 tablet 3  . rosuvastatin (CRESTOR) 20 MG tablet  (Patient not taking: No sig reported)    . sodium fluoride (PREVIDENT 5000 PLUS) 1.1 % CREA dental cream Patient to use as directed on a toothbrush or alternatively in his fluoride trays as instructed.  Patient is to spit out excess and not swallow.  Repeat nightly. (Patient not taking: No sig reported) 2 Tube prn  . sucralfate (CARAFATE) 1 g tablet Dissolve 1 tablet in 10 mL H20 and swallow 20 min prior to meals and bedtime. (Patient not taking: No sig reported) 40 tablet 3   No current facility-administered medications for this visit.     PHYSICAL EXAMINATION: ECOG PERFORMANCE STATUS: 2 - Symptomatic, <50% confined to bed  There were no vitals filed for  this visit. There were no vitals filed for this visit.  GENERAL:alert, no distress and comfortable SKIN: skin color, texture, turgor are normal, no rashes or significant lesions EYES: normal, conjunctiva are pink and non-injected, sclera clear OROPHARYNX:no exudate, no erythema and lips, buccal mucosa, and tongue normal  NECK: supple, thyroid normal size, non-tender, without nodularity LYMPH:  no palpable lymphadenopathy in the cervical, axillary or inguinal LUNGS: clear to auscultation and percussion with normal breathing effort HEART: regular rate & rhythm and no murmurs and no lower extremity edema ABDOMEN:abdomen soft, non-tender and normal bowel sounds Musculoskeletal:no cyanosis of digits and no clubbing  PSYCH: alert & oriented x 3 with fluent speech NEURO: no focal motor/sensory deficits  LABORATORY DATA:  I have reviewed the data as listed Lab Results  Component Value Date   WBC 4.4 05/12/2020   HGB 13.8 05/12/2020   HCT 41.5 05/12/2020   MCV 92.0  05/12/2020   PLT 362 05/12/2020     Chemistry      Component Value Date/Time   NA 135 05/12/2020 1147   K 4.4 05/12/2020 1147   CL 97 (L) 05/12/2020 1147   CO2 27 05/12/2020 1147   BUN 14 05/12/2020 1147   CREATININE 0.90 05/12/2020 1147   CREATININE 0.94 05/11/2020 1201      Component Value Date/Time   CALCIUM 9.9 05/12/2020 1147   ALKPHOS 137 (H) 05/12/2020 1147   AST 26 05/12/2020 1147   AST 24 05/11/2020 1201   ALT 11 05/12/2020 1147   ALT 8 05/11/2020 1201   BILITOT 0.6 05/12/2020 1147   BILITOT 0.2 (L) 05/11/2020 1201       RADIOGRAPHIC STUDIES: I have personally reviewed the radiological images as listed and agreed with the findings in the report. MR CERVICAL SPINE W WO CONTRAST  Result Date: 04/26/2020 CLINICAL DATA:  Metastatic head neck cancer. EXAM: MRI CERVICAL SPINE WITHOUT AND WITH CONTRAST TECHNIQUE: Multiplanar and multiecho pulse sequences of the cervical spine, to include the craniocervical  junction and cervicothoracic junction, were obtained without and with intravenous contrast. CONTRAST:  30mL MULTIHANCE GADOBENATE DIMEGLUMINE 529 MG/ML IV SOLN COMPARISON:  None. FINDINGS: Alignment: Mild retrolisthesis C4-5. Vertebrae: Diffuse fatty bone marrow may represent radiation change. No metastatic disease in the cervical spine. No fracture. Cord: Normal signal and morphology. Posterior Fossa, vertebral arteries, paraspinal tissues: Negative Disc levels: C2-3: Right foraminal disc protrusion with right foraminal stenosis C3-4: Left foraminal narrowing due to spurring. C4-5: Central disc protrusion and associated spurring right greater than left. Moderate right foraminal stenosis. Cord flattening and mild spinal stenosis right greater than left. Mild left foraminal narrowing. C5-6: Central disc protrusion with cord flattening and mild spinal stenosis. Neural foramina patent bilaterally. C6-7: Shallow central disc protrusion.  Negative for stenosis C7-T1: Negative IMPRESSION: Negative for metastatic disease cervical spine Multilevel degenerative change as detailed above. Electronically Signed   By: Franchot Gallo M.D.   On: 04/26/2020 12:15   MR THORACIC SPINE W WO CONTRAST  Result Date: 04/26/2020 CLINICAL DATA:  Head neck cancer with known metastatic disease. EXAM: MRI THORACIC WITHOUT AND WITH CONTRAST TECHNIQUE: Multiplanar and multiecho pulse sequences of the thoracic spine were obtained without and with intravenous contrast. CONTRAST:  53mL MULTIHANCE GADOBENATE DIMEGLUMINE 529 MG/ML IV SOLN COMPARISON:  Thoracic MRI 04/20/2020 FINDINGS: Alignment:  Normal Vertebrae: Multiple enhancing metastatic deposits in the thoracic spine involving T7 vertebral body, much of the T8 vertebral body, T9, T10, and T11 vertebral bodies. The lesions show mild interval growth since the prior study 6 days ago. There is a mild pathologic fracture of T8 which is unchanged. No epidural tumor or cord compression. Cord:   Normal signal and morphology Paraspinal and other soft tissues: Negative Disc levels: Mild thoracic disc degeneration. No significant stenosis. Small central disc protrusion T4-5, T5-6, T6-7. Small central disc protrusion T8-9. IMPRESSION: Metastatic disease in the thoracic spine with mild progression since 6 days ago. Mild pathologic fracture T8 No epidural tumor and no cord compression. Electronically Signed   By: Franchot Gallo M.D.   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 EXAM: MRI LUMBAR SPINE WITHOUT AND WITH CONTRAST TECHNIQUE: Multiplanar and multiecho pulse sequences of the lumbar spine were obtained without and with intravenous contrast. CONTRAST:  48mL MULTIHANCE GADOBENATE DIMEGLUMINE 529 MG/ML IV SOLN COMPARISON:  None. FINDINGS: Segmentation:  Normal Alignment:  Normal Vertebrae: Multiple enhancing bone marrow lesions compatible with widespread metastatic disease. This involves L1 vertebral body, L2, L3, L5, as well as the sacrum and left iliac bone. No epidural tumor. No fracture. Conus medullaris and cauda equina: Conus extends to the L1-2 level. Conus and cauda equina appear normal. Paraspinal and other soft tissues: Negative for retroperitoneal adenopathy. Disc levels: No significant lumbar spinal stenosis or neural impingement. Shallow central disc protrusion at L5-S1 with annular tear. IMPRESSION: Extensive metastatic disease throughout the lumbar spine as well as the sacrum and left iliac bone. No pathologic fracture or epidural tumor. Electronically Signed   By: Franchot Gallo M.D.   On: 04/26/2020 12:05   NM PET Image Restag (PS) Skull Base To Thigh  Result Date: 05/04/2020 CLINICAL DATA:  Subsequent treatment strategy for head and neck cancer. EXAM: NUCLEAR MEDICINE PET SKULL BASE TO THIGH TECHNIQUE: 7.6 mCi F-18 FDG was injected intravenously. Full-ring PET imaging was  performed from the skull base to thigh after the radiotracer. CT data was obtained and used for attenuation correction and anatomic localization. Fasting blood glucose:  mg/dl COMPARISON:  05/28/2018 FINDINGS: Mediastinal blood pool activity: SUV max 2.4 Liver activity: SUV max NA NECK: No hypermetabolic lymph nodes in the neck. Incidental CT findings: none CHEST: Hypermetabolic lymphadenopathy in the right hilum identified with SUV max = 6.2. Lymph nodes in the subcarinal station show low level uptake. 14 mm left perifissural nodule on 84/4 shows low level hypermetabolism with SUV max = 2.5. Small focus of FDG accumulation in the left lower lobe may reflect misregistration to a 5 mm left lower lobe nodule on 96/4. There is patchy clustered ill-defined nodularity in the right upper and lower lobes having a somewhat tree-in-bud configuration suggesting infection. Incidental CT findings: none ABDOMEN/PELVIS: 3.5 cm necrotic inferior right liver mass (image 132/series 4) demonstrates SUV max = 11.5. No hypermetabolic lymphadenopathy. Incidental CT findings: There is abdominal aortic atherosclerosis without aneurysm. SKELETON: Diffuse uptake in the thoracolumbar spine is suspicious for metastatic involvement. New hypermetabolic right humeral head lesion visible on image 63/4 shows SUV max = 10.1. Left sternal lesion demonstrates SUV max = 6.1. Mixed lytic and sclerotic lesion in the L3 vertebral body demonstrates SUV max = 10.8. 3.7 cm right sacral lesion demonstrates SUV max = 12.1. Numerous additional hypermetabolic bone lesions are seen in the bony pelvis. Incidental CT findings: None. IMPRESSION: 1. Widespread hypermetabolic bone metastases. 2. 3.5 cm necrotic hypermetabolic metastatic lesion in the right liver. 3. Hypermetabolic uptake in the right hilum associated with 2 hypermetabolic left lung nodules. Imaging appearance is concerning for metastatic disease. 4. Patchy/nodular ground-glass opacity in the right  lung having a tree-in-bud configuration. This would be an atypical appearance for metastatic disease and infectious etiology is favored, potentially atypical. 5.  Aortic Atherosclerois (ICD10-170.0) Electronically Signed   By: Misty Stanley M.D.   On: 05/04/2020 20:27   [place] CORE BIOPSY (LIVER)  Result Date: 05/12/2020 INDICATION: 50 year old with squamous cell carcinoma at the base of the tongue. Recent PET-CT imaging demonstrates multiple bone lesions and a liver lesion. Request for a liver biopsy. EXAM: ULTRASOUND-GUIDED LIVER LESION BIOPSY MEDICATIONS: None. ANESTHESIA/SEDATION: Moderate (conscious) sedation was employed during this procedure. A total of Versed 2.0 mg and Fentanyl 100 mcg was administered intravenously. Moderate Sedation Time: 18 minutes. The patient's level of consciousness and vital signs were monitored continuously by radiology nursing throughout the procedure under my direct supervision. FLUOROSCOPY TIME:  None COMPLICATIONS: None immediate. PROCEDURE: Informed written  consent was obtained from the patient after a thorough discussion of the procedural risks, benefits and alternatives. All questions were addressed. A timeout was performed prior to the initiation of the procedure. Liver was evaluated with ultrasound. Solitary lesion in the inferior right hepatic lobe was identified and targeted. Right side of the abdomen was prepped with chlorhexidine and a sterile field was created. Skin and soft tissues were anesthetized using 1% lidocaine. Small incision was made. Using ultrasound guidance, 17 gauge coaxial needle was directed into the right hepatic lobe and the hepatic lesion. Total of 4 core biopsies were obtained with an 18 gauge core device. Specimens placed in formalin. 17 gauge coaxial needle was removed without complication. Bandage placed over the puncture site. FINDINGS: Slightly hyperechoic lesion in the inferior right hepatic lobe with central hypoechogenicity. Total of 4 core  biopsies were obtained from this lesion. Specimens placed in formalin. No immediate bleeding or complication. IMPRESSION: Successful ultrasound-guided core biopsy of the right hepatic lesion. Electronically Signed   By: Markus Daft M.D.   On: 05/12/2020 16:13   XR FEMUR MIN 2 VIEWS LEFT  Result Date: 05/17/2020 An AP and lateral left femur shows no lytic lesions from the hip down to the knee and no evidence of cortical destruction or worrisome features given the patient's history of metastatic cancer.  XR FEMUR, MIN 2 VIEWS RIGHT  Result Date: 05/17/2020 2 views of the right femur showed no worrisome lytic lesions or cortical destruction from the hip down to the knee given the patient's history of metastatic cancer.   All questions were answered. The patient knows to call the clinic with any problems, questions or concerns. I spent 70 minutes in the care of this patient including H and P, review of records, counseling and coordination of care. I reviewed his older records, pathology, independently reviewed imaging, reviewed pathology with the pathologist and discussed plan of care in detail.    Benay Pike, MD 05/18/2020 1:03 PM

## 2020-05-19 ENCOUNTER — Telehealth: Payer: Self-pay | Admitting: Hematology and Oncology

## 2020-05-19 ENCOUNTER — Ambulatory Visit
Admission: RE | Admit: 2020-05-19 | Discharge: 2020-05-19 | Disposition: A | Discharge status: Home / Self Care | Payer: Managed Care, Other (non HMO) | Source: Ambulatory Visit | Attending: Radiation Oncology | Admitting: Radiation Oncology

## 2020-05-19 DIAGNOSIS — C7951 Secondary malignant neoplasm of bone: Secondary | ICD-10-CM | POA: Diagnosis not present

## 2020-05-19 NOTE — Telephone Encounter (Signed)
Scheduled appointment per 3/2 los. Spoke to patient who is aware of appointment date and time.

## 2020-05-20 ENCOUNTER — Ambulatory Visit
Admission: RE | Admit: 2020-05-20 | Discharge: 2020-05-20 | Disposition: A | Discharge status: Home / Self Care | Payer: Managed Care, Other (non HMO) | Source: Ambulatory Visit | Attending: Radiation Oncology | Admitting: Radiation Oncology

## 2020-05-20 ENCOUNTER — Telehealth: Payer: Self-pay | Admitting: Hematology and Oncology

## 2020-05-20 ENCOUNTER — Other Ambulatory Visit: Payer: Self-pay

## 2020-05-20 DIAGNOSIS — J189 Pneumonia, unspecified organism: Secondary | ICD-10-CM

## 2020-05-20 DIAGNOSIS — C7951 Secondary malignant neoplasm of bone: Secondary | ICD-10-CM | POA: Diagnosis not present

## 2020-05-20 MED ORDER — AZITHROMYCIN 250 MG PO TABS: 250.0000 mg | ORAL_TABLET | Freq: Every day | ORAL | 0 refills | Status: AC

## 2020-05-20 NOTE — Telephone Encounter (Signed)
Released records to [facility] to [phone]

## 2020-05-23 ENCOUNTER — Ambulatory Visit
Admission: RE | Admit: 2020-05-23 | Discharge: 2020-05-23 | Disposition: A | Discharge status: Home / Self Care | Payer: Managed Care, Other (non HMO) | Source: Ambulatory Visit | Attending: Radiation Oncology | Admitting: Radiation Oncology

## 2020-05-23 DIAGNOSIS — C7951 Secondary malignant neoplasm of bone: Secondary | ICD-10-CM | POA: Diagnosis not present

## 2020-05-24 ENCOUNTER — Ambulatory Visit: Payer: Managed Care, Other (non HMO)

## 2020-05-24 ENCOUNTER — Ambulatory Visit
Admission: RE | Admit: 2020-05-24 | Discharge: 2020-05-24 | Disposition: A | Discharge status: Home / Self Care | Payer: Managed Care, Other (non HMO) | Source: Ambulatory Visit | Attending: Radiation Oncology | Admitting: Radiation Oncology

## 2020-05-24 ENCOUNTER — Telehealth: Payer: Self-pay

## 2020-05-24 DIAGNOSIS — C7951 Secondary malignant neoplasm of bone: Secondary | ICD-10-CM | POA: Diagnosis not present

## 2020-05-24 NOTE — Telephone Encounter (Signed)
Patient left a VM this morning complaining about new onset of reflux symptoms that he believes are a side effect of his Z-pak prescription. He wanted to know if it would be acceptable to try an OTC reflux medication to manage symptoms, or if she should try something else given the other medications he's taking.   Relayed patient's concern presciber Dr. Chryl Heck. Was advised that patient could take an OTC reflux medication, but to call back by the end of the week if symptoms had not improved, and Dr. Chryl Heck will look into other options.   Returned patient's call and relayed above information. Patient stated he should be finished with his Z-pak prescription this coming Thursday, but confirmed that he would call clinic back if symptoms don't improve before the weekend. He voiced appreciation of call and denied any other needs at this time. Patient knows to call me back directly should have any other questions/concerns.

## 2020-05-25 ENCOUNTER — Ambulatory Visit
Admission: RE | Admit: 2020-05-25 | Discharge: 2020-05-25 | Disposition: A | Discharge status: Home / Self Care | Payer: Managed Care, Other (non HMO) | Source: Ambulatory Visit | Attending: Radiation Oncology | Admitting: Radiation Oncology

## 2020-05-25 DIAGNOSIS — C7951 Secondary malignant neoplasm of bone: Secondary | ICD-10-CM | POA: Diagnosis not present

## 2020-05-26 ENCOUNTER — Ambulatory Visit
Admission: RE | Admit: 2020-05-26 | Discharge: 2020-05-26 | Disposition: A | Discharge status: Home / Self Care | Payer: Managed Care, Other (non HMO) | Source: Ambulatory Visit | Attending: Radiation Oncology | Admitting: Radiation Oncology

## 2020-05-26 DIAGNOSIS — C7951 Secondary malignant neoplasm of bone: Secondary | ICD-10-CM | POA: Diagnosis not present

## 2020-05-27 ENCOUNTER — Ambulatory Visit
Admission: RE | Admit: 2020-05-27 | Discharge: 2020-05-27 | Disposition: A | Discharge status: Home / Self Care | Payer: Managed Care, Other (non HMO) | Source: Ambulatory Visit | Attending: Radiation Oncology | Admitting: Radiation Oncology

## 2020-05-27 ENCOUNTER — Encounter: Payer: Self-pay | Admitting: Radiation Oncology

## 2020-05-27 DIAGNOSIS — C7951 Secondary malignant neoplasm of bone: Secondary | ICD-10-CM | POA: Diagnosis not present

## 2020-05-30 ENCOUNTER — Ambulatory Visit
Admission: RE | Admit: 2020-05-30 | Discharge: 2020-05-30 | Disposition: A | Discharge status: Home / Self Care | Payer: Managed Care, Other (non HMO) | Source: Ambulatory Visit | Attending: Radiation Oncology | Admitting: Radiation Oncology

## 2020-05-30 ENCOUNTER — Encounter: Payer: Self-pay | Admitting: Radiation Oncology

## 2020-05-30 DIAGNOSIS — C7951 Secondary malignant neoplasm of bone: Secondary | ICD-10-CM | POA: Diagnosis not present

## 2020-06-13 NOTE — Progress Notes (Signed)
Oncology Nurse Navigator Documentation  Per Albert Flowers' wife MD [name] is requesting PDL1 testing results as they are not able to see them in [place]. I have faxed the results of the testing to the fax number provided by Albert Flowers and received notice of successful fax transmission.   Harlow Asa RN, BSN, OCN Head & Neck Oncology Nurse [place] at [facility] Phone # [phone]  Fax # [phone]

## 2020-06-15 ENCOUNTER — Telehealth: Payer: Self-pay

## 2020-06-15 ENCOUNTER — Inpatient Hospital Stay: Payer: Managed Care, Other (non HMO) | Admitting: Hematology and Oncology

## 2020-06-15 NOTE — Progress Notes (Deleted)
[facility] NOTE  Patient Care Team: [name], MD as PCP - General (Family Medicine) [name], MD as Consulting Physician (Otolaryngology) [name], MD as Attending Physician (Radiation Oncology) Leota Sauers, RN (Inactive) as Registered Nurse (Oncology) Wynelle Beckmann, Melodie Bouillon, PT as Physical Therapist (Physical Therapy) Sharen Counter, CCC-SLP as Speech Language Pathologist (Speech Pathology) Karie Mainland, RD as Dietitian (Nutrition) Malmfelt, Stephani Police, RN as Oncology Nurse Navigator (Oncology)  CHIEF COMPLAINTS/PURPOSE OF CONSULTATION:   Metastatic SCC oropharynx, HPV positive.  ASSESSMENT & PLAN:   No problem-specific Assessment & Plan notes found for this encounter.  No orders of the defined types were placed in this encounter.  1. Metastatic HPV positive oropharyngeal cancer.  PET CT images demonstrated diffuse bony mets, liver disease and right hilar uptake. He is undergoing palliative radiation to the pelvis at this time, Pain is reasonably well controlled with radiation and gabapentin. I have discussed that this is metastatic and intent of treatment is not curative. We have discussed available options including chemoimmunotherapy depending on PDL1 expression vs immunotherapy only in high PDL1 expressors ( CPS greater than 20) vs chemotherapy alone vs clinical trial. Given his young age, decent PS and aggressive HPV positive biology, I agree it is reasonable to consider upfront clinical trial. He is here for FU after his recent visit to [facility].   2. Bone lesions, pain in femur. He has seen Dr Ninfa Linden from ortho who didn't recommend any surgical intervention at this time.  3. Pain well controlled with gabapentin and palliative radiation to the T-L spine and left hip.  4. Concern for atypical pneumonia on imaging. No major findings on exam Will give him a trial of z pak.  HISTORY OF PRESENTING ILLNESS:  Albert Flowers 50  y.o. male is here because of metastatic HPV positive BOT cancer  Chronology  In summary, patient presents with Dr. Constance Holster of ENT in late 03/2018 for evaluation of an enlarging right neck mass.  CT neck showed necrotic right Level II and III necrotic LN's, possibly from oropharyngeal oral cavity primary, but the study was limited due to streak artifacts.  He underwent FNA of the R Level II LN, which showed squamous cell carcinoma, p16+.  PET in 05/2018 showed asymmetric FDG uptake in the right tonsil/base of the tongue, likely the primary malignancy.  In addition, there were two FDG-avid R Level II LN's without evidence of contralateral cervical LN or metastatic disease.  Given the relatively low volume disease, Dr Maylon Peppers recommended upfront surgery, followed by adjuvant therapy based on the final pathology. He had TORS on 06/16/2018.  Pathology from the procedure showed: invasive p16-positive (HPV-related) squamous cell carcinoma, 1.4 cm, negative margins. Out of 24 biopsied lymph nodes, only one was positive for metastatic carcinoma (1/24).  This node was 4.5cm but with no extracapsular extension.  No LVSI and no PNI.  He underwent right neck dissection but not left neck dissection.  At postop follow up, they agreed to proceed with observation and reserve radiation therapy for any possible future recurrence.  At follow up on 04/07/2019, Dr. [name] noted a new suspicious left upper neck lymph node. Neck CT performed that day revealed: new heterogeneous 2.2 cm contralateral left level 2a lymph node suspicious for contralateral nodal metastasis; indeterminate mild interval increase in size of several left level 2a and 2b lymph nodes.  PET scan performed on 04/16/2019 showed: single hypermetabolic left cervical lymph node; mild asymmetric FDG uptake in left glossotonsillar  sulcus; focal FDG uptake in L1 vertebral body without CT correlate; otherwise, no malignant-range FDG uptake elsewhere.  He underwent  left tonsillectomy and left neck dissection on 05/04/2019 with pathology revealing: benign left tonsil. Out of a total of 25 biopsied lymph nodes, only one was positive for metastatic carcinoma but negative for extracapsular extension and positive for p16.  He was advised adjuvant radiation at this time. He completed radiation on 07/24/2019. He was seen for FU by Dr Isidore Moos in December when he was doing well. He then started complaining of back pain in February. This led to MRI and PET imaging.  04/26/2020 MRI showed extensive metastatic disease throughout the lumbar spine as well as the sacrum and left iliac bone. No pathologic fracture or epidural Tumor.  05/04/2020 PET CT showed widespread hypermetabolic bone mets. 3.5 cm necrotic hypermetabolic met lesion in right liver. Hypermetabolic uptake in right hilum associated with 2 hypermetabolic lung nodules. Patchy/nodular ground-glass opacity in the right lung having a tree-in-bud configuration. This would be an atypical appearance for metastatic disease and infectious etiology is favored, potentially Atypical.  05/12/2020, liver biopsy confirmed metastatic HPV positive SCC  REVIEW OF SYSTEMS:   Constitutional: Denies fevers, chills or abnormal night sweats Eyes: Denies blurriness of vision, double vision or watery eyes Ears, nose, mouth, throat, and face: Denies mucositis or sore throat Respiratory: Denies cough, dyspnea or wheezes Cardiovascular: Denies palpitation, chest discomfort or lower extremity swelling Gastrointestinal:  Denies nausea, heartburn or change in bowel habits Skin: Denies abnormal skin rashes Lymphatics: Denies new lymphadenopathy or easy bruising Neurological:Denies numbness, tingling or new weaknesses Behavioral/Psych: Mood is stable, no new changes  All other systems were reviewed with the patient and are negative.  MEDICAL HISTORY:  Past Medical History:  Diagnosis Date  . Diabetes mellitus without complication  ([place])     SURGICAL HISTORY: Past Surgical History:  Procedure Laterality Date  . left neck dissection Left 05/04/2019   Left Neck Dissection by Dr. [name] at [facility].   . neck sugery     as a child "had a knot removed from neck" not sure which side.   . RIght neck dissection Right 06/16/2018   TORS and right neck dissection. Dr. [name] at [place]: Social History   Socioeconomic History  . Marital status: Married    Spouse name: Not on file  . Number of children: 4  . Years of education: Not on file  . Highest education level: Not on file  Occupational History  . Not on file  Tobacco Use  . Smoking status: Never Smoker  . Smokeless tobacco: Never Used  Vaping Use  . Vaping Use: Never used  Substance and Sexual Activity  . Alcohol use: Yes    Comment: occasional  . Drug use: Never  . Sexual activity: Not on file  Other Topics Concern  . Not on file  Social History Narrative  . Not on file   Social Determinants of Health   Financial Resource Strain: Not on file  Food Insecurity: Not on file  Transportation Needs: Not on file  Physical Activity: Not on file  Stress: Not on file  Social Connections: Not on file  Intimate Partner Violence: Not on file    FAMILY HISTORY: No family history on file.  ALLERGIES:  is allergic to penicillins.  MEDICATIONS:  Current Outpatient Medications  Medication Sig Dispense Refill  . diphenhydrAMINE (BENADRYL) 25 MG tablet Take 25 mg by mouth at bedtime.    Marland Kitchen  gabapentin (NEURONTIN) 300 MG capsule Take 900 mg by mouth 3 (three) times daily.    . methocarbamol (ROBAXIN) 750 MG tablet Take 1 tablet (750 mg total) by mouth every 6 (six) hours as needed for muscle spasms. 40 tablet 1  . nystatin (MYCOSTATIN) 100000 UNIT/ML suspension Take 5 mLs (500,000 Units total) by mouth 4 (four) times daily. Swish and spit or swallow until better. (Patient not taking: No sig reported) 240 mL 0  . ondansetron (ZOFRAN ODT) 8 MG  disintegrating tablet Take 1 tablet (8 mg total) by mouth every 8 (eight) hours as needed for nausea or vomiting. 30 tablet 3  . sodium fluoride (PREVIDENT 5000 PLUS) 1.1 % CREA dental cream Patient to use as directed on a toothbrush or alternatively in his fluoride trays as instructed.  Patient is to spit out excess and not swallow.  Repeat nightly. (Patient not taking: No sig reported) 2 Tube prn  . sucralfate (CARAFATE) 1 g tablet Dissolve 1 tablet in 10 mL H20 and swallow 20 min prior to meals and bedtime. (Patient not taking: No sig reported) 40 tablet 3   No current facility-administered medications for this visit.     PHYSICAL EXAMINATION: ECOG PERFORMANCE STATUS: 2 - Symptomatic, <50% confined to bed  There were no vitals filed for this visit. There were no vitals filed for this visit.  GENERAL:alert, no distress and comfortable SKIN: skin color, texture, turgor are normal, no rashes or significant lesions EYES: normal, conjunctiva are pink and non-injected, sclera clear OROPHARYNX:no exudate, no erythema and lips, buccal mucosa, and tongue normal  NECK: supple, thyroid normal size, non-tender, without nodularity LYMPH:  no palpable lymphadenopathy in the cervical, axillary or inguinal LUNGS: clear to auscultation and percussion with normal breathing effort HEART: regular rate & rhythm and no murmurs and no lower extremity edema ABDOMEN:abdomen soft, non-tender and normal bowel sounds Musculoskeletal:no cyanosis of digits and no clubbing  PSYCH: alert & oriented x 3 with fluent speech NEURO: no focal motor/sensory deficits  LABORATORY DATA:  I have reviewed the data as listed Lab Results  Component Value Date   WBC 4.4 05/12/2020   HGB 13.8 05/12/2020   HCT 41.5 05/12/2020   MCV 92.0 05/12/2020   PLT 362 05/12/2020     Chemistry      Component Value Date/Time   NA 135 05/12/2020 1147   K 4.4 05/12/2020 1147   CL 97 (L) 05/12/2020 1147   CO2 27 05/12/2020 1147   BUN  14 05/12/2020 1147   CREATININE 0.90 05/12/2020 1147   CREATININE 0.94 05/11/2020 1201      Component Value Date/Time   CALCIUM 9.9 05/12/2020 1147   ALKPHOS 137 (H) 05/12/2020 1147   AST 26 05/12/2020 1147   AST 24 05/11/2020 1201   ALT 11 05/12/2020 1147   ALT 8 05/11/2020 1201   BILITOT 0.6 05/12/2020 1147   BILITOT 0.2 (L) 05/11/2020 1201       RADIOGRAPHIC STUDIES: I have personally reviewed the radiological images as listed and agreed with the findings in the report. XR FEMUR MIN 2 VIEWS LEFT  Result Date: 05/17/2020 An AP and lateral left femur shows no lytic lesions from the hip down to the knee and no evidence of cortical destruction or worrisome features given the patient's history of metastatic cancer.  XR FEMUR, MIN 2 VIEWS RIGHT  Result Date: 05/17/2020 2 views of the right femur showed no worrisome lytic lesions or cortical destruction from the hip down to the knee  given the patient's history of metastatic cancer.   All questions were answered. The patient knows to call the clinic with any problems, questions or concerns. I spent 70 minutes in the care of this patient including H and P, review of records, counseling and coordination of care. I reviewed his older records, pathology, independently reviewed imaging, reviewed pathology with the pathologist and discussed plan of care in detail.    Benay Pike, MD 06/15/2020 8:36 AM

## 2020-06-15 NOTE — Telephone Encounter (Signed)
Called patient regarding today's appointment with Dr. Chryl Heck. Patient states that they are currently at the [facility] in [place]. Patient would like to call to reschedule appointments when they return. Patient stated that he already had the number to call to reschedule.

## 2020-06-16 ENCOUNTER — Other Ambulatory Visit: Payer: Self-pay | Admitting: Hematology and Oncology

## 2020-06-16 DIAGNOSIS — C01 Malignant neoplasm of base of tongue: Secondary | ICD-10-CM

## 2020-06-16 DIAGNOSIS — C090 Malignant neoplasm of tonsillar fossa: Secondary | ICD-10-CM

## 2020-06-16 MED ORDER — PROCHLORPERAZINE MALEATE 10 MG PO TABS: 10.0000 mg | ORAL_TABLET | Freq: Four times a day (QID) | ORAL | 1 refills | Status: DC | PRN

## 2020-06-16 MED ORDER — LIDOCAINE-PRILOCAINE 2.5-2.5 % EX CREA: TOPICAL_CREAM | CUTANEOUS | 3 refills | Status: DC

## 2020-06-16 NOTE — Progress Notes (Signed)
START ON PATHWAY REGIMEN - Head and Neck     A cycle is every 21 days:     Pembrolizumab   **Always confirm dose/schedule in your pharmacy ordering system**  Patient Characteristics: Oropharynx, HPV Positive, Metastatic, First Line, No Prior Platinum-Based Chemoradiation within 6 Months, PD-L1 Expression Positive (CPS ? 1) Disease Classification: Oropharynx HPV Status: Positive (+) Therapeutic Status: Metastatic Disease Line of Therapy: First Line Prior Platinum Status: No Prior Platinum-Based Chemoradiation within 6 Months PD-L1 Expression Status: PD-L1 Positive (CPS ? 1) Intent of Therapy: Non-Curative / Palliative Intent, Discussed with Patient

## 2020-06-16 NOTE — Progress Notes (Signed)
I called the wife. She said unfortunately due to his cytopenias, he may not be able to participate in the trial. They would recommend first line immunotherapy alone with consideration to add chemo later once his counts improve. [facility] plan placed for Pembro alone.  Inbasket sent to scheduling team to schedule his appointments.  Albert Flowers

## 2020-06-17 ENCOUNTER — Telehealth: Payer: Self-pay | Admitting: Hematology and Oncology

## 2020-06-17 ENCOUNTER — Encounter: Payer: Self-pay | Admitting: Hematology and Oncology

## 2020-06-17 NOTE — Telephone Encounter (Signed)
Scheduled appts per 3/31 sch msg. Pt aware.  

## 2020-06-23 ENCOUNTER — Other Ambulatory Visit: Payer: Self-pay

## 2020-06-23 ENCOUNTER — Encounter: Payer: Self-pay | Admitting: Hematology and Oncology

## 2020-06-23 ENCOUNTER — Inpatient Hospital Stay: Payer: Managed Care, Other (non HMO) | Attending: Hematology and Oncology | Admitting: Hematology and Oncology

## 2020-06-23 ENCOUNTER — Inpatient Hospital Stay: Payer: Managed Care, Other (non HMO)

## 2020-06-23 VITALS — BP 102/76 | HR 77 | Temp 97.6°F | Resp 18 | Ht 70.0 in | Wt 147.3 lb

## 2020-06-23 DIAGNOSIS — R630 Anorexia: Secondary | ICD-10-CM | POA: Diagnosis not present

## 2020-06-23 DIAGNOSIS — C7951 Secondary malignant neoplasm of bone: Secondary | ICD-10-CM | POA: Diagnosis not present

## 2020-06-23 DIAGNOSIS — C090 Malignant neoplasm of tonsillar fossa: Secondary | ICD-10-CM

## 2020-06-23 DIAGNOSIS — Z5112 Encounter for antineoplastic immunotherapy: Secondary | ICD-10-CM | POA: Insufficient documentation

## 2020-06-23 DIAGNOSIS — G893 Neoplasm related pain (acute) (chronic): Secondary | ICD-10-CM | POA: Diagnosis not present

## 2020-06-23 DIAGNOSIS — C109 Malignant neoplasm of oropharynx, unspecified: Secondary | ICD-10-CM | POA: Insufficient documentation

## 2020-06-23 DIAGNOSIS — Z923 Personal history of irradiation: Secondary | ICD-10-CM | POA: Diagnosis not present

## 2020-06-23 DIAGNOSIS — E119 Type 2 diabetes mellitus without complications: Secondary | ICD-10-CM | POA: Diagnosis not present

## 2020-06-23 DIAGNOSIS — Z79899 Other long term (current) drug therapy: Secondary | ICD-10-CM | POA: Insufficient documentation

## 2020-06-23 MED ORDER — OXYCODONE HCL 5 MG PO TABS: 5.0000 mg | ORAL_TABLET | Freq: Four times a day (QID) | ORAL | 0 refills | Status: AC | PRN

## 2020-06-23 MED ORDER — MIRTAZAPINE 15 MG PO TABS: 15.0000 mg | ORAL_TABLET | Freq: Every day | ORAL | 0 refills | Status: DC

## 2020-06-23 NOTE — Progress Notes (Signed)
[facility] NOTE  Patient Care Team: [name], MD as PCP - General (Family Medicine) [name], MD as Consulting Physician (Otolaryngology) [name], MD as Attending Physician (Radiation Oncology) Leota Sauers, RN (Inactive) as Registered Nurse (Oncology) Wynelle Beckmann, Melodie Bouillon, PT as Physical Therapist (Physical Therapy) Sharen Counter, CCC-SLP as Speech Language Pathologist (Speech Pathology) Karie Mainland, RD as Dietitian (Nutrition) Malmfelt, Stephani Police, RN as Oncology Nurse Navigator (Oncology)  CHIEF COMPLAINTS/PURPOSE OF CONSULTATION:   Metastatic SCC oropharynx, HPV positive.  ASSESSMENT & PLAN:   No problem-specific Assessment & Plan notes found for this encounter.  No orders of the defined types were placed in this encounter.  1. Metastatic HPV positive oropharyngeal cancer. We have reviewed PET CT images independently with patient and his wife today which demonstrated diffuse bony mets, liver disease and right hilar uptake. I have discussed that this is metastatic and intent of treatment is not curative. We have discussed available options including chemoimmunotherapy depending on PDL1 expression vs immunotherapy only in high PDL1 expressors ( CPS greater than 20) vs chemotherapy alone vs clinical trial. Given his young age, decent PS and aggressive HPV positive biology, we sent him to [facility] for consideration of a trial. Unfortunately he has cytopenias which make him ineligible for the trial. Since he didn't have any systemic therapy recently, this could likely from recent radiation and it is expected to improve. Given cytopenias, we will start with [facility] for first cycle and will plan to add chemotherapy to immunotherapy if labs improve. Labs tomorrow, first cycle of Keytruda on Monday.  2. Bone lesions, pain in femur. He has seen Dr Ninfa Linden from ortho who didn't recommend any surgical intervention at this time. Given  uncontrolled pain, I offered him oxycodone IR along with gabapentin He can take gabapentin QHS and oxycodone during the day.  3. 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 Will prescribe him mirtazapine. He was wondering about medical cannabis which we can consider at a later time if no improvement with other medications.  HISTORY OF PRESENTING ILLNESS:  Albert Flowers 50 y.o. male is here because of metastatic HPV positive BOT cancer  Chronology  In summary, patient presents with Dr. Constance Holster of ENT in late 03/2018 for evaluation of an enlarging right neck mass.  CT neck showed necrotic right Level II and III necrotic LN's, possibly from oropharyngeal oral cavity primary, but the study was limited due to streak artifacts.  He underwent FNA of the R Level II LN, which showed squamous cell carcinoma, p16+.  PET in 05/2018 showed asymmetric FDG uptake in the right tonsil/base of the tongue, likely the primary malignancy.  In addition, there were two FDG-avid R Level II LN's without evidence of contralateral cervical LN or metastatic disease.  Given the relatively low volume disease, Dr Maylon Peppers recommended upfront surgery, followed by adjuvant therapy based on the final pathology. He had TORS on 06/16/2018.  Pathology from the procedure showed: invasive p16-positive (HPV-related) squamous cell carcinoma, 1.4 cm, negative margins. Out of 24 biopsied lymph nodes, only one was positive for metastatic carcinoma (1/24).  This node was 4.5cm but with no extracapsular extension.  No LVSI and no PNI.  He underwent right neck dissection but not left neck dissection.  At postop follow up, they agreed to proceed with observation and reserve radiation therapy for any possible future recurrence.  At follow up on 04/07/2019, Dr. [name] noted a new suspicious left upper neck lymph node. Neck CT performed that day  revealed: new heterogeneous 2.2 cm contralateral left level 2a lymph node suspicious for contralateral nodal metastasis;  indeterminate mild interval increase in size of several left level 2a and 2b lymph nodes.  PET scan performed on 04/16/2019 showed: single hypermetabolic left cervical lymph node; mild asymmetric FDG uptake in left glossotonsillar sulcus; focal FDG uptake in L1 vertebral body without CT correlate; otherwise, no malignant-range FDG uptake elsewhere.  He underwent left tonsillectomy and left neck dissection on 05/04/2019 with pathology revealing: benign left tonsil. Out of a total of 25 biopsied lymph nodes, only one was positive for metastatic carcinoma but negative for extracapsular extension and positive for p16.  He was advised adjuvant radiation at this time. He completed radiation on 07/24/2019. He was seen for FU by Dr Isidore Moos in December when he was doing well. He then started complaining of back pain in February. This led to MRI and PET imaging.  04/26/2020 MRI showed extensive metastatic disease throughout the lumbar spine as well as the sacrum and left iliac bone. No pathologic fracture or epidural Tumor.  05/04/2020 PET CT showed widespread hypermetabolic bone mets. 3.5 cm necrotic hypermetabolic met lesion in right liver. Hypermetabolic uptake in right hilum associated with 2 hypermetabolic lung nodules. Patchy/nodular ground-glass opacity in the right lung having a tree-in-bud configuration. This would be an atypical appearance for metastatic disease and infectious etiology is favored, potentially Atypical.  05/12/2020, liver biopsy confirmed metastatic HPV positive SCC  He had palliative radiation to spine on 3.14.2022 He complains of severe pain today radiating to his left hip, gabapentin helps him but doesn't entirely take care of it. He also noticed that he has no appetite and was wondering if we can help him with some weight gain Dry mouth since he had radiation.  No other new complaints. No neurological deficit.  Rest of the pertinent 10 point ROS reviewed and  negative.  REVIEW OF SYSTEMS:   Constitutional: Denies fevers, chills or abnormal night sweats Eyes: Denies blurriness of vision, double vision or watery eyes Ears, nose, mouth, throat, and face: Denies mucositis or sore throat Respiratory: Denies cough, dyspnea or wheezes Cardiovascular: Denies palpitation, chest discomfort or lower extremity swelling Gastrointestinal:  Denies nausea, heartburn or change in bowel habits Skin: Denies abnormal skin rashes Lymphatics: Denies new lymphadenopathy or easy bruising Neurological:Denies numbness, tingling or new weaknesses Behavioral/Psych: Mood is stable, no new changes  All other systems were reviewed with the patient and are negative.  MEDICAL HISTORY:  Past Medical History:  Diagnosis Date  . Diabetes mellitus without complication ([place])     SURGICAL HISTORY: Past Surgical History:  Procedure Laterality Date  . left neck dissection Left 05/04/2019   Left Neck Dissection by Dr. [name] at [facility].   . neck sugery     as a child "had a knot removed from neck" not sure which side.   . RIght neck dissection Right 06/16/2018   TORS and right neck dissection. Dr. [name] at [place]: Social History   Socioeconomic History  . Marital status: Married    Spouse name: Not on file  . Number of children: 4  . Years of education: Not on file  . Highest education level: Not on file  Occupational History  . Not on file  Tobacco Use  . Smoking status: Never Smoker  . Smokeless tobacco: Never Used  Vaping Use  . Vaping Use: Never used  Substance and Sexual Activity  . Alcohol use: Yes    Comment:  occasional  . Drug use: Never  . Sexual activity: Not on file  Other Topics Concern  . Not on file  Social History Narrative  . Not on file   Social Determinants of Health   Financial Resource Strain: Not on file  Food Insecurity: Not on file  Transportation Needs: Not on file  Physical Activity: Not on file  Stress:  Not on file  Social Connections: Not on file  Intimate Partner Violence: Not on file    FAMILY HISTORY: No family history on file.  ALLERGIES:  is allergic to penicillins.  MEDICATIONS:  Current Outpatient Medications  Medication Sig Dispense Refill  . mirtazapine (REMERON) 15 MG tablet Take 1 tablet (15 mg total) by mouth at bedtime. 30 tablet 0  . oxyCODONE (OXY IR/ROXICODONE) 5 MG immediate release tablet Take 1 tablet (5 mg total) by mouth every 6 (six) hours as needed for up to 15 days for severe pain. 60 tablet 0  . diphenhydrAMINE (BENADRYL) 25 MG tablet Take 25 mg by mouth at bedtime.    . gabapentin (NEURONTIN) 300 MG capsule Take 900 mg by mouth 3 (three) times daily.    Marland Kitchen lidocaine-prilocaine (EMLA) cream Apply to affected area once 30 g 3  . methocarbamol (ROBAXIN) 750 MG tablet Take 1 tablet (750 mg total) by mouth every 6 (six) hours as needed for muscle spasms. 40 tablet 1  . nystatin (MYCOSTATIN) 100000 UNIT/ML suspension Take 5 mLs (500,000 Units total) by mouth 4 (four) times daily. Swish and spit or swallow until better. (Patient not taking: No sig reported) 240 mL 0  . ondansetron (ZOFRAN ODT) 8 MG disintegrating tablet Take 1 tablet (8 mg total) by mouth every 8 (eight) hours as needed for nausea or vomiting. 30 tablet 3  . prochlorperazine (COMPAZINE) 10 MG tablet Take 1 tablet (10 mg total) by mouth every 6 (six) hours as needed (Nausea or vomiting). 30 tablet 1  . sodium fluoride (PREVIDENT 5000 PLUS) 1.1 % CREA dental cream Patient to use as directed on a toothbrush or alternatively in his fluoride trays as instructed.  Patient is to spit out excess and not swallow.  Repeat nightly. (Patient not taking: No sig reported) 2 Tube prn  . sucralfate (CARAFATE) 1 g tablet Dissolve 1 tablet in 10 mL H20 and swallow 20 min prior to meals and bedtime. (Patient not taking: No sig reported) 40 tablet 3   No current facility-administered medications for this visit.      PHYSICAL EXAMINATION: ECOG PERFORMANCE STATUS: 2 - Symptomatic, <50% confined to bed  Vitals:   06/23/20 1510  BP: 102/76  Pulse: 77  Resp: 18  Temp: 97.6 F (36.4 C)  SpO2: 100%   Filed Weights   06/23/20 1510  Weight: 147 lb 4.8 oz (66.8 kg)    GENERAL:alert, no distress and comfortable SKIN: skin color, texture, turgor are normal, no rashes or significant lesions EYES: normal, conjunctiva are pink and non-injected, sclera clear OROPHARYNX:no exudate, no erythema and lips, buccal mucosa, and tongue normal  NECK: supple, thyroid normal size, non-tender, without nodularity LYMPH:  no palpable lymphadenopathy in the cervical, axillary or inguinal LUNGS: clear to auscultation and percussion with normal breathing effort HEART: regular rate & rhythm and no murmurs and no lower extremity edema ABDOMEN:abdomen soft, non-tender and normal bowel sounds Musculoskeletal:no cyanosis of digits and no clubbing  PSYCH: alert & oriented x 3 with fluent speech NEURO: no focal motor/sensory deficits  LABORATORY DATA:  I have reviewed the data  as listed Lab Results  Component Value Date   WBC 4.4 05/12/2020   HGB 13.8 05/12/2020   HCT 41.5 05/12/2020   MCV 92.0 05/12/2020   PLT 362 05/12/2020     Chemistry      Component Value Date/Time   NA 135 05/12/2020 1147   K 4.4 05/12/2020 1147   CL 97 (L) 05/12/2020 1147   CO2 27 05/12/2020 1147   BUN 14 05/12/2020 1147   CREATININE 0.90 05/12/2020 1147   CREATININE 0.94 05/11/2020 1201      Component Value Date/Time   CALCIUM 9.9 05/12/2020 1147   ALKPHOS 137 (H) 05/12/2020 1147   AST 26 05/12/2020 1147   AST 24 05/11/2020 1201   ALT 11 05/12/2020 1147   ALT 8 05/11/2020 1201   BILITOT 0.6 05/12/2020 1147   BILITOT 0.2 (L) 05/11/2020 1201      RADIOGRAPHIC STUDIES: I have personally reviewed the radiological images as listed and agreed with the findings in the report. No results found.  All questions were answered.  The patient knows to call the clinic with any problems, questions or concerns. I spent 30 minutes in the care of this patient including H and P, review of records, counseling and coordination of care. I reviewed his older records, pathology, independently reviewed imaging, reviewed pathology with the pathologist and discussed plan of care in detail. I discussed plan of care with his [facility] oncologist. Chemotherapy plan in place.    Benay Pike, MD 06/23/2020 5:30 PM

## 2020-06-24 ENCOUNTER — Inpatient Hospital Stay: Payer: Managed Care, Other (non HMO)

## 2020-06-24 ENCOUNTER — Ambulatory Visit: Payer: Managed Care, Other (non HMO) | Admitting: Hematology and Oncology

## 2020-06-24 ENCOUNTER — Other Ambulatory Visit: Payer: Managed Care, Other (non HMO)

## 2020-06-24 DIAGNOSIS — C090 Malignant neoplasm of tonsillar fossa: Secondary | ICD-10-CM

## 2020-06-24 DIAGNOSIS — C109 Malignant neoplasm of oropharynx, unspecified: Secondary | ICD-10-CM | POA: Diagnosis not present

## 2020-06-24 DIAGNOSIS — C01 Malignant neoplasm of base of tongue: Secondary | ICD-10-CM

## 2020-06-24 LAB — CMP (CANCER CENTER ONLY)
ALT: 39 U/L (ref 0–44)
AST: 43 U/L — ABNORMAL HIGH (ref 15–41)
Albumin: 3.7 g/dL (ref 3.5–5.0)
Alkaline Phosphatase: 109 U/L (ref 38–126)
Anion gap: 12 (ref 5–15)
BUN: 13 mg/dL (ref 6–20)
CO2: 27 mmol/L (ref 22–32)
Calcium: 8.8 mg/dL — ABNORMAL LOW (ref 8.9–10.3)
Chloride: 100 mmol/L (ref 98–111)
Creatinine: 0.79 mg/dL (ref 0.61–1.24)
GFR, Estimated: >60 mL/min (ref >60)
Glucose, Bld: 115 mg/dL — ABNORMAL HIGH (ref 70–99)
Potassium: 4.5 mmol/L (ref 3.5–5.1)
Sodium: 139 mmol/L (ref 135–145)
Total Bilirubin: 0.3 mg/dL (ref 0.3–1.2)
Total Protein: 7.1 g/dL (ref 6.5–8.1)

## 2020-06-24 LAB — CBC WITH DIFFERENTIAL (CANCER CENTER ONLY)
Abs Immature Granulocytes: 0.09 10*3/uL — ABNORMAL HIGH (ref 0.00–0.07)
Basophils Absolute: 0 10*3/uL (ref 0.0–0.1)
Basophils Relative: 1 %
Eosinophils Absolute: 0.1 10*3/uL (ref 0.0–0.5)
Eosinophils Relative: 3 %
HCT: 30.6 % — ABNORMAL LOW (ref 39.0–52.0)
Hemoglobin: 10.2 g/dL — ABNORMAL LOW (ref 13.0–17.0)
Immature Granulocytes: 4 %
Lymphocytes Relative: 10 %
Lymphs Abs: 0.2 10*3/uL — ABNORMAL LOW (ref 0.7–4.0)
MCH: 30.7 pg (ref 26.0–34.0)
MCHC: 33.3 g/dL (ref 30.0–36.0)
MCV: 92.2 fL (ref 80.0–100.0)
Monocytes Absolute: 0.5 10*3/uL (ref 0.1–1.0)
Monocytes Relative: 23 %
Neutro Abs: 1.2 10*3/uL — ABNORMAL LOW (ref 1.7–7.7)
Neutrophils Relative %: 59 %
Platelet Count: 145 10*3/uL — ABNORMAL LOW (ref 150–400)
RBC: 3.32 MIL/uL — ABNORMAL LOW (ref 4.22–5.81)
RDW: 14.7 % (ref 11.5–15.5)
WBC Count: 2 10*3/uL — ABNORMAL LOW (ref 4.0–10.5)
nRBC: 0 % (ref 0.0–0.2)

## 2020-06-25 LAB — T4: T4, Total: 6.4 ug/dL (ref 4.5–12.0)

## 2020-06-27 ENCOUNTER — Inpatient Hospital Stay: Payer: Managed Care, Other (non HMO)

## 2020-06-27 ENCOUNTER — Other Ambulatory Visit: Payer: Self-pay

## 2020-06-27 VITALS — BP 108/82 | HR 100 | Temp 99.4°F | Resp 18 | Wt 148.2 lb

## 2020-06-27 DIAGNOSIS — C01 Malignant neoplasm of base of tongue: Secondary | ICD-10-CM

## 2020-06-27 DIAGNOSIS — C090 Malignant neoplasm of tonsillar fossa: Secondary | ICD-10-CM

## 2020-06-27 DIAGNOSIS — C109 Malignant neoplasm of oropharynx, unspecified: Secondary | ICD-10-CM | POA: Diagnosis not present

## 2020-06-27 LAB — TSH: TSH: 2.778 u[IU]/mL (ref 0.320–4.118)

## 2020-06-27 MED ORDER — SODIUM CHLORIDE 0.9 % IV SOLN
Freq: Once | INTRAVENOUS | Status: AC
  Filled 2020-06-27: qty 250

## 2020-06-27 MED ORDER — SODIUM CHLORIDE 0.9 % IV SOLN
200.0000 mg | Freq: Once | INTRAVENOUS | Status: AC
  Administered 2020-06-27: 200 mg via INTRAVENOUS
  Filled 2020-06-27: qty 8

## 2020-06-27 NOTE — Patient Instructions (Signed)
[facility] Discharge Instructions for Patients Receiving Chemotherapy  Today you received the following chemotherapy agents: Keytruda  To help prevent nausea and vomiting after your treatment, we encourage you to take your nausea medication as needed.   If you develop nausea and vomiting that is not controlled by your nausea medication, call the clinic.   BELOW ARE SYMPTOMS THAT SHOULD BE REPORTED IMMEDIATELY:  *FEVER GREATER THAN 100.5 F  *CHILLS WITH OR WITHOUT FEVER  NAUSEA AND VOMITING THAT IS NOT CONTROLLED WITH YOUR NAUSEA MEDICATION  *UNUSUAL SHORTNESS OF BREATH  *UNUSUAL BRUISING OR BLEEDING  TENDERNESS IN MOUTH AND THROAT WITH OR WITHOUT PRESENCE OF ULCERS  *URINARY PROBLEMS  *BOWEL PROBLEMS  UNUSUAL RASH Items with * indicate a potential emergency and should be followed up as soon as possible.  Feel free to call the clinic should you have any questions or concerns. The clinic phone number is [phone].  Please show the [facility] at check-in to the Emergency Department and triage nurse.  Pembrolizumab injection What is this medicine? PEMBROLIZUMAB (pem broe liz ue mab) is a monoclonal antibody. It is used to treat certain types of cancer. This medicine may be used for other purposes; ask your health care provider or pharmacist if you have questions. COMMON BRAND NAME(S): Keytruda What should I tell my health care provider before I take this medicine? They need to know if you have any of these conditions:  autoimmune diseases like Crohn's disease, ulcerative colitis, or lupus  have had or planning to have an allogeneic stem cell transplant (uses someone else's stem cells)  history of organ transplant  history of chest radiation  nervous system problems like myasthenia gravis or Guillain-Barre syndrome  an unusual or allergic reaction to pembrolizumab, other medicines, foods, dyes, or preservatives  pregnant or trying to get  pregnant  breast-feeding How should I use this medicine? This medicine is for infusion into a vein. It is given by a health care professional in a hospital or clinic setting. A special MedGuide will be given to you before each treatment. Be sure to read this information carefully each time. Talk to your pediatrician regarding the use of this medicine in children. While this drug may be prescribed for children as young as 6 months for selected conditions, precautions do apply. Overdosage: If you think you have taken too much of this medicine contact a poison control center or emergency room at once. NOTE: This medicine is only for you. Do not share this medicine with others. What if I miss a dose? It is important not to miss your dose. Call your doctor or health care professional if you are unable to keep an appointment. What may interact with this medicine? Interactions have not been studied. This list may not describe all possible interactions. Give your health care provider a list of all the medicines, herbs, non-prescription drugs, or dietary supplements you use. Also tell them if you smoke, drink alcohol, or use illegal drugs. Some items may interact with your medicine. What should I watch for while using this medicine? Your condition will be monitored carefully while you are receiving this medicine. You may need blood work done while you are taking this medicine. Do not become pregnant while taking this medicine or for 4 months after stopping it. Women should inform their doctor if they wish to become pregnant or think they might be pregnant. There is a potential for serious side effects to an unborn child. Talk to  your health care professional or pharmacist for more information. Do not breast-feed an infant while taking this medicine or for 4 months after the last dose. What side effects may I notice from receiving this medicine? Side effects that you should report to your doctor or health  care professional as soon as possible:  allergic reactions like skin rash, itching or hives, swelling of the face, lips, or tongue  bloody or black, tarry  breathing problems  changes in vision  chest pain  chills  confusion  constipation  cough  diarrhea  dizziness or feeling faint or lightheaded  fast or irregular heartbeat  fever  flushing  joint pain  low blood counts - this medicine may decrease the number of white blood cells, red blood cells and platelets. You may be at increased risk for infections and bleeding.  muscle pain  muscle weakness  pain, tingling, numbness in the hands or feet  persistent headache  redness, blistering, peeling or loosening of the skin, including inside the mouth  signs and symptoms of high blood sugar such as dizziness; dry mouth; dry skin; fruity breath; nausea; stomach pain; increased hunger or thirst; increased urination  signs and symptoms of kidney injury like trouble passing urine or change in the amount of urine  signs and symptoms of liver injury like dark urine, light-colored stools, loss of appetite, nausea, right upper belly pain, yellowing of the eyes or skin  sweating  swollen lymph nodes  weight loss Side effects that usually do not require medical attention (report to your doctor or health care professional if they continue or are bothersome):  decreased appetite  hair loss  tiredness This list may not describe all possible side effects. Call your doctor for medical advice about side effects. You may report side effects to FDA at 1-800-FDA-1088. Where should I keep my medicine? This drug is given in a hospital or clinic and will not be stored at home. NOTE: This sheet is a summary. It may not cover all possible information. If you have questions about this medicine, talk to your doctor, pharmacist, or health care provider.  2021 Elsevier/Gold Standard (2019-02-04 21:44:53)

## 2020-06-27 NOTE — Progress Notes (Signed)
Per Dr. Chryl Heck, okay for patient to received treatment today with ANC 1.2

## 2020-07-04 ENCOUNTER — Encounter: Payer: Self-pay | Admitting: Medical

## 2020-07-05 ENCOUNTER — Encounter: Payer: Self-pay | Admitting: Hematology and Oncology

## 2020-07-05 ENCOUNTER — Ambulatory Visit: Payer: Managed Care, Other (non HMO) | Admitting: Radiation Oncology

## 2020-07-05 ENCOUNTER — Other Ambulatory Visit: Payer: Self-pay | Admitting: *Deleted

## 2020-07-05 MED ORDER — OMEPRAZOLE 40 MG PO CPDR: 40.0000 mg | DELAYED_RELEASE_CAPSULE | Freq: Every day | ORAL | 0 refills | Status: DC

## 2020-07-07 ENCOUNTER — Encounter: Payer: Self-pay | Admitting: Medical

## 2020-07-12 ENCOUNTER — Inpatient Hospital Stay (HOSPITAL_BASED_OUTPATIENT_CLINIC_OR_DEPARTMENT_OTHER): Payer: Managed Care, Other (non HMO) | Admitting: Hematology and Oncology

## 2020-07-12 ENCOUNTER — Encounter: Payer: Self-pay | Admitting: Hematology and Oncology

## 2020-07-12 ENCOUNTER — Inpatient Hospital Stay: Payer: Managed Care, Other (non HMO)

## 2020-07-12 ENCOUNTER — Other Ambulatory Visit: Payer: Self-pay

## 2020-07-12 VITALS — BP 104/72 | HR 88 | Temp 97.7°F | Resp 18 | Ht 70.0 in | Wt 150.5 lb

## 2020-07-12 DIAGNOSIS — C7951 Secondary malignant neoplasm of bone: Secondary | ICD-10-CM | POA: Diagnosis not present

## 2020-07-12 DIAGNOSIS — C090 Malignant neoplasm of tonsillar fossa: Secondary | ICD-10-CM

## 2020-07-12 DIAGNOSIS — C109 Malignant neoplasm of oropharynx, unspecified: Secondary | ICD-10-CM | POA: Diagnosis not present

## 2020-07-12 DIAGNOSIS — C01 Malignant neoplasm of base of tongue: Secondary | ICD-10-CM

## 2020-07-12 DIAGNOSIS — R630 Anorexia: Secondary | ICD-10-CM | POA: Diagnosis not present

## 2020-07-12 LAB — CMP (CANCER CENTER ONLY)
ALT: 25 U/L (ref 0–44)
AST: 40 U/L (ref 15–41)
Albumin: 3.5 g/dL (ref 3.5–5.0)
Alkaline Phosphatase: 149 U/L — ABNORMAL HIGH (ref 38–126)
Anion gap: 8 (ref 5–15)
BUN: 8 mg/dL (ref 6–20)
CO2: 29 mmol/L (ref 22–32)
Calcium: 9.1 mg/dL (ref 8.9–10.3)
Chloride: 101 mmol/L (ref 98–111)
Creatinine: 0.74 mg/dL (ref 0.61–1.24)
GFR, Estimated: >60 mL/min (ref >60)
Glucose, Bld: 100 mg/dL — ABNORMAL HIGH (ref 70–99)
Potassium: 4.3 mmol/L (ref 3.5–5.1)
Sodium: 138 mmol/L (ref 135–145)
Total Bilirubin: 0.3 mg/dL (ref 0.3–1.2)
Total Protein: 7.1 g/dL (ref 6.5–8.1)

## 2020-07-12 LAB — CBC WITH DIFFERENTIAL (CANCER CENTER ONLY)
Abs Immature Granulocytes: 0.13 10*3/uL — ABNORMAL HIGH (ref 0.00–0.07)
Basophils Absolute: 0 10*3/uL (ref 0.0–0.1)
Basophils Relative: 0 %
Eosinophils Absolute: 0.4 10*3/uL (ref 0.0–0.5)
Eosinophils Relative: 9 %
HCT: 28.9 % — ABNORMAL LOW (ref 39.0–52.0)
Hemoglobin: 9.5 g/dL — ABNORMAL LOW (ref 13.0–17.0)
Immature Granulocytes: 3 %
Lymphocytes Relative: 7 %
Lymphs Abs: 0.3 10*3/uL — ABNORMAL LOW (ref 0.7–4.0)
MCH: 30.9 pg (ref 26.0–34.0)
MCHC: 32.9 g/dL (ref 30.0–36.0)
MCV: 94.1 fL (ref 80.0–100.0)
Monocytes Absolute: 0.8 10*3/uL (ref 0.1–1.0)
Monocytes Relative: 19 %
Neutro Abs: 2.8 10*3/uL (ref 1.7–7.7)
Neutrophils Relative %: 62 %
Platelet Count: 189 10*3/uL (ref 150–400)
RBC: 3.07 MIL/uL — ABNORMAL LOW (ref 4.22–5.81)
RDW: 16.4 % — ABNORMAL HIGH (ref 11.5–15.5)
WBC Count: 4.5 10*3/uL (ref 4.0–10.5)
nRBC: 0 % (ref 0.0–0.2)

## 2020-07-12 LAB — TSH: TSH: 2.654 u[IU]/mL (ref 0.320–4.118)

## 2020-07-12 MED ORDER — DEXAMETHASONE 4 MG PO TABS: 8.0000 mg | ORAL_TABLET | Freq: Every day | ORAL | 1 refills | Status: DC

## 2020-07-12 MED ORDER — LIDOCAINE-PRILOCAINE 2.5-2.5 % EX CREA: TOPICAL_CREAM | CUTANEOUS | 3 refills | Status: DC

## 2020-07-12 MED ORDER — PROCHLORPERAZINE MALEATE 10 MG PO TABS: 10.0000 mg | ORAL_TABLET | Freq: Four times a day (QID) | ORAL | 1 refills | Status: DC | PRN

## 2020-07-12 NOTE — Assessment & Plan Note (Signed)
This is likely secondary to the cancer.  He is currently on Remeron 15 mg nightly.  We will increase the dose to 30 mg nightly such.  According to the wife he has not lost any weight since his last visit although his appetite is not great.  We will continue to monitor this.

## 2020-07-12 NOTE — Progress Notes (Signed)
DISCONTINUE ON PATHWAY REGIMEN - Head and Neck     A cycle is every 21 days:     Pembrolizumab   **Always confirm dose/schedule in your pharmacy ordering system**  REASON: Other Reason PRIOR TREATMENT: [id number]: Pembrolizumab 200 mg q21 Days for up to 24 Months TREATMENT RESPONSE: Unable to Evaluate  START ON PATHWAY REGIMEN - Head and Neck     A cycle is every 21 days:     Carboplatin      Fluorouracil      Pembrolizumab   **Always confirm dose/schedule in your pharmacy ordering system**  Patient Characteristics: Oropharynx, HPV Positive, Metastatic, First Line, No Prior Platinum-Based Chemoradiation within 6 Months, PD-L1 Expression Positive (CPS ? 1) Disease Classification: Oropharynx HPV Status: Positive (+) Therapeutic Status: Metastatic Disease Line of Therapy: First Line Prior Platinum Status: No Prior Platinum-Based Chemoradiation within 6 Months PD-L1 Expression Status: PD-L1 Positive (CPS ? 1) Intent of Therapy: Non-Curative / Palliative Intent, Discussed with Patient

## 2020-07-12 NOTE — Assessment & Plan Note (Signed)
He has seen Dr Ninfa Linden from ortho who didn't recommend any surgical intervention at this time. Given uncontrolled pain, I offered him oxycodone IR along with gabapentin He can take gabapentin during day and oxy at night since this has made him very sleepy Pain is well controlled at this time and is mostly in right shoulder blade.

## 2020-07-12 NOTE — Assessment & Plan Note (Signed)
Given his young age, decent PS and aggressive HPV positive biology, we sent him to [facility] for consideration of a trial. Unfortunately he had cytopenias which made him ineligible for the trial. Since he didn't have any systemic therapy recently, this could likely from recent radiation and it is expected to improve. Given cytopenias, We started with [place] alone for C1. His cytopenia has now resolved hence we will proceed with carboplatin/5-FU/Keytruda. We have previously discussed about adverse effects of chemotherapy including but not limited to fatigue, nausea, vomiting, diarrhea, myelosuppression, increased risk of infections, coronary vasospasm, severe toxicity sometimes noted with 5-FU in patients with DPD deficiency.  He is willing to try everything he can. Cycle 2 treatment plan updated.  He will return to clinic in about 3 weeks. Thank you for consulting [place] in the care of this patient.  Please do not hesitate to contact us with any additional questions or concerns.

## 2020-07-12 NOTE — Progress Notes (Signed)
[name] NOTE  Patient Care Team: [name], MD as PCP - General (Family Medicine) [name], MD as Consulting Physician (Otolaryngology) [name], MD as Attending Physician (Radiation Oncology) Leota Sauers, RN (Inactive) as Registered Nurse (Oncology) Wynelle Beckmann, Melodie Bouillon, PT as Physical Therapist (Physical Therapy) Sharen Counter, CCC-SLP as Speech Language Pathologist (Speech Pathology) Karie Mainland, RD as Dietitian (Nutrition) Malmfelt, Stephani Police, RN as Oncology Nurse Navigator (Oncology)  CHIEF COMPLAINTS/PURPOSE OF CONSULTATION:   Metastatic SCC oropharynx, HPV positive.  ASSESSMENT & PLAN:   Bone metastases ([place]) He has seen Dr Ninfa Linden from ortho who didn't recommend any surgical intervention at this time. Given uncontrolled pain, I offered him oxycodone IR along with gabapentin He can take gabapentin during day and oxy at night since this has made him very sleepy Pain is well controlled at this time and is mostly in right shoulder blade.  Malignant neoplasm of base of tongue (HCC) Given his young age, decent PS and aggressive HPV positive biology, we sent him to [facility] for consideration of a trial. Unfortunately he had cytopenias which made him ineligible for the trial. Since he didn't have any systemic therapy recently, this could likely from recent radiation and it is expected to improve. Given cytopenias, We started with [place] alone for C1. His cytopenia has now resolved hence we will proceed with carboplatin/5-FU/Keytruda. We have previously discussed about adverse effects of chemotherapy including but not limited to fatigue, nausea, vomiting, diarrhea, myelosuppression, increased risk of infections, coronary vasospasm, severe toxicity sometimes noted with 5-FU in patients with DPD deficiency.  He is willing to try everything he can. Cycle 2 treatment plan updated.  He will return to clinic in about 3 weeks. Thank you  for consulting [place] in the care of this patient.  Please do not hesitate to contact us with any additional questions or concerns.  Anorexia This is likely secondary to the cancer.  He is currently on Remeron 15 mg nightly.  We will increase the dose to 30 mg nightly such.  According to the wife he has not lost any weight since his last visit although his appetite is not great.  We will continue to monitor this.  Orders Placed This Encounter  Procedures  . CBC with Differential/Platelet    Standing Status:   Standing    Number of Occurrences:   22    Standing Expiration Date:   07/12/2021  . CMP ([facility] only)    Standing Status:   Future    Standing Expiration Date:   07/12/2021  . CBC with Differential (Cancer Center Only)    Standing Status:   Standing    Number of Occurrences:   20    Standing Expiration Date:   07/12/2021  . CMP ([facility] only)    Standing Status:   Standing    Number of Occurrences:   20    Standing Expiration Date:   07/12/2021  . T4    Standing Status:   Standing    Number of Occurrences:   20    Standing Expiration Date:   07/12/2021  . TSH    Standing Status:   Standing    Number of Occurrences:   20    Standing Expiration Date:   07/12/2021  . PHYSICIAN COMMUNICATION ORDER    For pathway [id number]: Treatment with Pembrolizumab + Carboplatin + Fluorouracil x 6 Cycles, then Pembrolizumab alone for up to a total of 24 Months (35  cycles).  Julianne Rice PHYSICIAN COMMUNICATION 1    Thyroid function tests at baseline and every 3rd cycle.    HISTORY OF PRESENTING ILLNESS:  Albert Flowers 50 y.o. male is here because of metastatic HPV positive BOT cancer  Chronology  In summary, patient presents with Dr. Pollyann Kennedy of ENT in late 03/2018 for evaluation of an enlarging right neck mass.  CT neck showed necrotic right Level II and III necrotic LN's, possibly from oropharyngeal oral cavity primary, but the study was limited due to streak artifacts.  He underwent FNA of  the R Level II LN, which showed squamous cell carcinoma, p16+.  PET in 05/2018 showed asymmetric FDG uptake in the right tonsil/base of the tongue, likely the primary malignancy.  In addition, there were two FDG-avid R Level II LN's without evidence of contralateral cervical LN or metastatic disease.  Given the relatively low volume disease, Dr Dion Body recommended upfront surgery, followed by adjuvant therapy based on the final pathology. He had TORS on 06/16/2018.  Pathology from the procedure showed: invasive p16-positive (HPV-related) squamous cell carcinoma, 1.4 cm, negative margins. Out of 24 biopsied lymph nodes, only one was positive for metastatic carcinoma (1/24).  This node was 4.5cm but with no extracapsular extension.  No LVSI and no PNI.  He underwent right neck dissection but not left neck dissection.  At postop follow up, they agreed to proceed with observation and reserve radiation therapy for any possible future recurrence.  At follow up on 04/07/2019, Dr. Hezzie Bump noted a new suspicious left upper neck lymph node. Neck CT performed that day revealed: new heterogeneous 2.2 cm contralateral left level 2a lymph node suspicious for contralateral nodal metastasis; indeterminate mild interval increase in size of several left level 2a and 2b lymph nodes.  PET scan performed on 04/16/2019 showed: single hypermetabolic left cervical lymph node; mild asymmetric FDG uptake in left glossotonsillar sulcus; focal FDG uptake in L1 vertebral body without CT correlate; otherwise, no malignant-range FDG uptake elsewhere.  He underwent left tonsillectomy and left neck dissection on 05/04/2019 with pathology revealing: benign left tonsil. Out of a total of 25 biopsied lymph nodes, only one was positive for metastatic carcinoma but negative for extracapsular extension and positive for p16.  He was advised adjuvant radiation at this time. He completed radiation on 07/24/2019. He was seen for FU by Dr Basilio Cairo in  December when he was doing well. He then started complaining of back pain in February. This led to MRI and PET imaging.  04/26/2020 MRI showed extensive metastatic disease throughout the lumbar spine as well as the sacrum and left iliac bone. No pathologic fracture or epidural Tumor.  05/04/2020 PET CT showed widespread hypermetabolic bone mets. 3.5 cm necrotic hypermetabolic met lesion in right liver. Hypermetabolic uptake in right hilum associated with 2 hypermetabolic lung nodules. Patchy/nodular ground-glass opacity in the right lung having a tree-in-bud configuration. This would be an atypical appearance for metastatic disease and infectious etiology is favored, potentially Atypical.  05/12/2020, liver biopsy confirmed metastatic HPV positive SCC  He had palliative radiation to spine on 3.14.2022 He complains of severe pain today radiating to his left hip, gabapentin helps him but doesn't entirely take care of it. He also noticed that he has no appetite and was wondering if we can help him with some weight gain Dry mouth since he had radiation.  No other new complaints. No neurological deficit.  Rest of the pertinent 10 point ROS reviewed and negative.  REVIEW OF SYSTEMS:  Constitutional: Denies fevers, chills or abnormal night sweats Eyes: Denies blurriness of vision, double vision or watery eyes Ears, nose, mouth, throat, and face: Denies mucositis or sore throat Respiratory: Denies cough, dyspnea or wheezes Cardiovascular: Denies palpitation, chest discomfort or lower extremity swelling Gastrointestinal:  Denies nausea, heartburn or change in bowel habits Skin: Denies abnormal skin rashes Lymphatics: Denies new lymphadenopathy or easy bruising Neurological:Denies numbness, tingling or new weaknesses Behavioral/Psych: Mood is stable, no new changes  All other systems were reviewed with the patient and are negative.  MEDICAL HISTORY:  Past Medical History:  Diagnosis Date  .  Diabetes mellitus without complication ([place])     SURGICAL HISTORY: Past Surgical History:  Procedure Laterality Date  . left neck dissection Left 05/04/2019   Left Neck Dissection by Dr. [name] at [facility].   . neck sugery     as a child "had a knot removed from neck" not sure which side.   . RIght neck dissection Right 06/16/2018   TORS and right neck dissection. Dr. [name] at [place]: Social History   Socioeconomic History  . Marital status: Married    Spouse name: Not on file  . Number of children: 4  . Years of education: Not on file  . Highest education level: Not on file  Occupational History  . Not on file  Tobacco Use  . Smoking status: Never Smoker  . Smokeless tobacco: Never Used  Vaping Use  . Vaping Use: Never used  Substance and Sexual Activity  . Alcohol use: Yes    Comment: occasional  . Drug use: Never  . Sexual activity: Not on file  Other Topics Concern  . Not on file  Social History Narrative  . Not on file   Social Determinants of Health   Financial Resource Strain: Not on file  Food Insecurity: Not on file  Transportation Needs: Not on file  Physical Activity: Not on file  Stress: Not on file  Social Connections: Not on file  Intimate Partner Violence: Not on file    FAMILY HISTORY: History reviewed. No pertinent family history.  ALLERGIES:  is allergic to penicillins.  MEDICATIONS:  Current Outpatient Medications  Medication Sig Dispense Refill  . diphenhydrAMINE (BENADRYL) 25 MG tablet Take 25 mg by mouth at bedtime.    . gabapentin (NEURONTIN) 300 MG capsule Take 900 mg by mouth 3 (three) times daily.    . methocarbamol (ROBAXIN) 750 MG tablet Take 1 tablet (750 mg total) by mouth every 6 (six) hours as needed for muscle spasms. 40 tablet 1  . mirtazapine (REMERON) 15 MG tablet Take 1 tablet (15 mg total) by mouth at bedtime. 30 tablet 0  . omeprazole (PRILOSEC) 40 MG capsule Take 1 capsule (40 mg total) by mouth  daily. 30 capsule 0  . dexamethasone (DECADRON) 4 MG tablet Take 2 tablets (8 mg total) by mouth daily. Start the day after carboplatin chemotherapy for 3 days. 30 tablet 1  . lidocaine-prilocaine (EMLA) cream Apply to affected area once 30 g 3  . nystatin (MYCOSTATIN) 100000 UNIT/ML suspension Take 5 mLs (500,000 Units total) by mouth 4 (four) times daily. Swish and spit or swallow until better. (Patient not taking: No sig reported) 240 mL 0  . ondansetron (ZOFRAN ODT) 8 MG disintegrating tablet Take 1 tablet (8 mg total) by mouth every 8 (eight) hours as needed for nausea or vomiting. 30 tablet 3  . prochlorperazine (COMPAZINE) 10 MG tablet Take 1 tablet (10  mg total) by mouth every 6 (six) hours as needed (Nausea or vomiting). 30 tablet 1  . sodium fluoride (PREVIDENT 5000 PLUS) 1.1 % CREA dental cream Patient to use as directed on a toothbrush or alternatively in his fluoride trays as instructed.  Patient is to spit out excess and not swallow.  Repeat nightly. (Patient not taking: No sig reported) 2 Tube prn  . sucralfate (CARAFATE) 1 g tablet Dissolve 1 tablet in 10 mL H20 and swallow 20 min prior to meals and bedtime. (Patient not taking: No sig reported) 40 tablet 3   No current facility-administered medications for this visit.     PHYSICAL EXAMINATION: ECOG PERFORMANCE STATUS: 2 - Symptomatic, <50% confined to bed  Vitals:   07/12/20 1100  BP: 104/72  Pulse: 88  Resp: 18  Temp: 97.7 F (36.5 C)  SpO2: 100%   Filed Weights   07/12/20 1100  Weight: 150 lb 8 oz (68.3 kg)    GENERAL:alert, no distress and comfortable SKIN: skin color, texture, turgor are normal, no rashes or significant lesions EYES: normal, conjunctiva are pink and non-injected, sclera clear OROPHARYNX:no exudate, no erythema and lips, buccal mucosa, and tongue normal  NECK: supple, thyroid normal size, non-tender, without nodularity LYMPH:  no palpable lymphadenopathy in the cervical, axillary or  inguinal LUNGS: clear to auscultation and percussion with normal breathing effort HEART: regular rate & rhythm and no murmurs and no lower extremity edema ABDOMEN:abdomen soft, non-tender and normal bowel sounds Musculoskeletal:no cyanosis of digits and no clubbing  PSYCH: alert & oriented x 3 with fluent speech NEURO: no focal motor/sensory deficits  LABORATORY DATA:  I have reviewed the data as listed Lab Results  Component Value Date   WBC 4.5 07/12/2020   HGB 9.5 (L) 07/12/2020   HCT 28.9 (L) 07/12/2020   MCV 94.1 07/12/2020   PLT 189 07/12/2020     Chemistry      Component Value Date/Time   NA 139 06/24/2020 1518   K 4.5 06/24/2020 1518   CL 100 06/24/2020 1518   CO2 27 06/24/2020 1518   BUN 13 06/24/2020 1518   CREATININE 0.79 06/24/2020 1518      Component Value Date/Time   CALCIUM 8.8 (L) 06/24/2020 1518   ALKPHOS 109 06/24/2020 1518   AST 43 (H) 06/24/2020 1518   ALT 39 06/24/2020 1518   BILITOT 0.3 06/24/2020 1518      RADIOGRAPHIC STUDIES: I have personally reviewed the radiological images as listed and agreed with the findings in the report. No results found.  All questions were answered. The patient knows to call the clinic with any problems, questions or concerns. I spent 30 minutes in the care of this patient including H and P, review of records, counseling and coordination of care. We have edited his plan from [facility] alone to [place] with chemotherapy, coordinated with our pharmacy, infusion, scheduling team.    Benay Pike, MD 07/12/2020 12:55 PM

## 2020-07-13 ENCOUNTER — Other Ambulatory Visit: Payer: Self-pay | Admitting: Hematology and Oncology

## 2020-07-13 DIAGNOSIS — C090 Malignant neoplasm of tonsillar fossa: Secondary | ICD-10-CM

## 2020-07-13 LAB — T4: T4, Total: 6.8 ug/dL (ref 4.5–12.0)

## 2020-07-14 ENCOUNTER — Other Ambulatory Visit: Payer: Self-pay | Admitting: Radiology

## 2020-07-15 ENCOUNTER — Encounter (HOSPITAL_COMMUNITY): Payer: Self-pay

## 2020-07-15 ENCOUNTER — Ambulatory Visit (HOSPITAL_COMMUNITY)
Admission: RE | Admit: 2020-07-15 | Discharge: 2020-07-15 | Disposition: A | Discharge status: Home / Self Care | Payer: Managed Care, Other (non HMO) | Source: Ambulatory Visit | Attending: Hematology and Oncology | Admitting: Hematology and Oncology

## 2020-07-15 ENCOUNTER — Encounter: Payer: Self-pay | Admitting: Hematology and Oncology

## 2020-07-15 ENCOUNTER — Other Ambulatory Visit: Payer: Self-pay

## 2020-07-15 ENCOUNTER — Telehealth: Payer: Self-pay | Admitting: Hematology and Oncology

## 2020-07-15 DIAGNOSIS — Z880 Allergy status to penicillin: Secondary | ICD-10-CM | POA: Insufficient documentation

## 2020-07-15 DIAGNOSIS — C090 Malignant neoplasm of tonsillar fossa: Secondary | ICD-10-CM | POA: Insufficient documentation

## 2020-07-15 DIAGNOSIS — Z79899 Other long term (current) drug therapy: Secondary | ICD-10-CM | POA: Insufficient documentation

## 2020-07-15 DIAGNOSIS — E119 Type 2 diabetes mellitus without complications: Secondary | ICD-10-CM | POA: Insufficient documentation

## 2020-07-15 HISTORY — PX: IR IMAGING GUIDED PORT INSERTION: IMG5740

## 2020-07-15 LAB — GLUCOSE, CAPILLARY
Glucose-Capillary: 113 mg/dL — ABNORMAL HIGH (ref 70–99)
Glucose-Capillary: 140 mg/dL — ABNORMAL HIGH (ref 70–99)

## 2020-07-15 IMAGING — US IR IMAGING GUIDED PORT INSERTION
2 series · 3 of 3 positions shown · non-contrast
Comparison: none

CLINICAL DATA: Squamous cell carcinoma of the tongue, needs durable
venous access for planned treatment regimen
TECHNIQUE: The procedure, risks, benefits, and alternatives were explained to
the patient. Questions regarding the procedure were encouraged and
answered. The patient understands and consents to the procedure.

[Series 1: fl (-) angio · 2 of 2 slices shown]
[im 1/2]
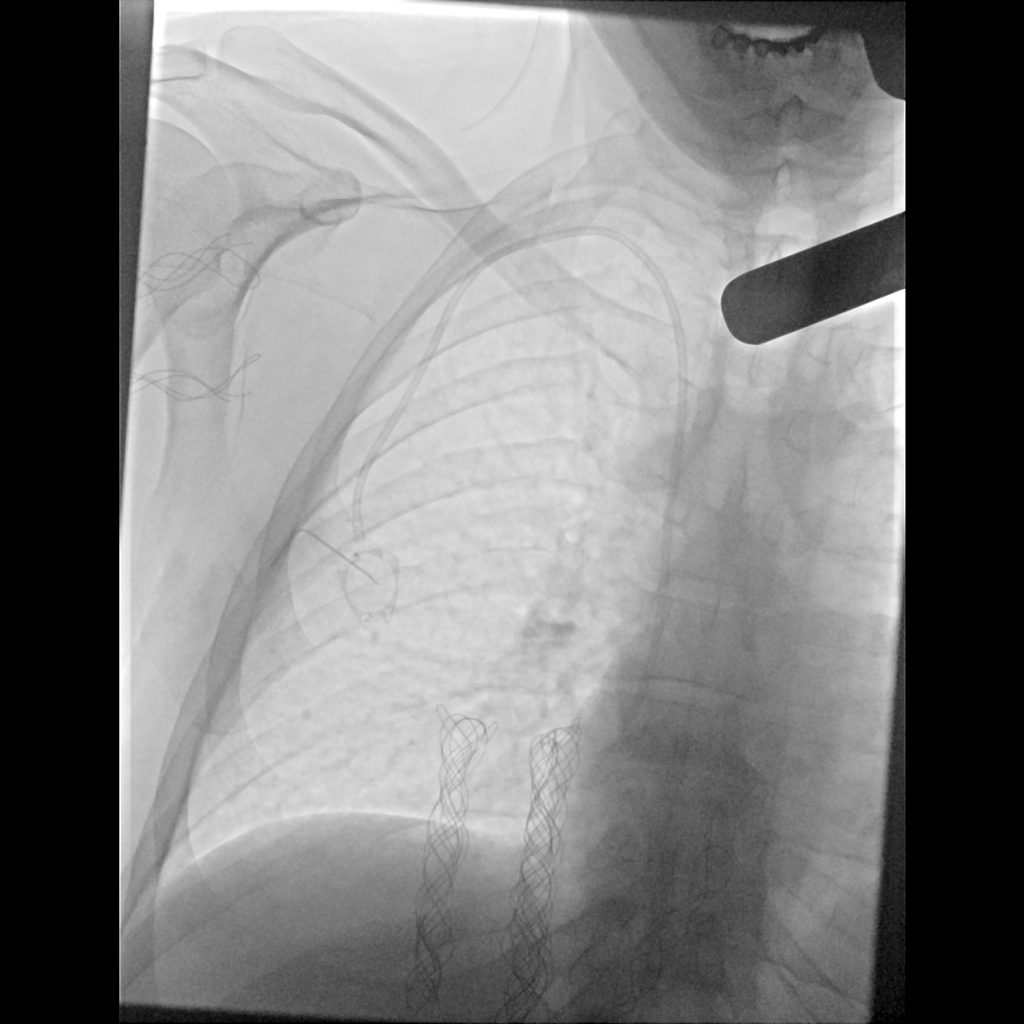
[im 2/2]
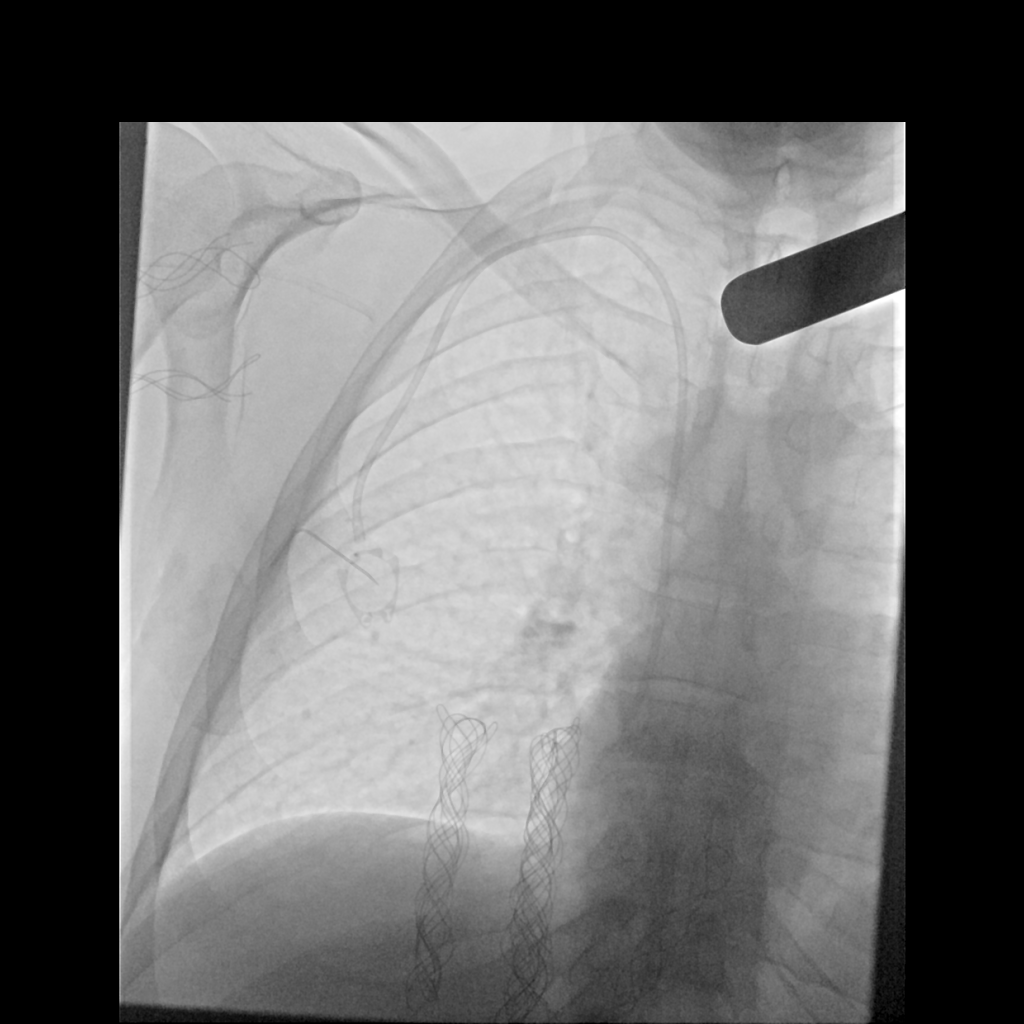

[Series 1: ir imaging guided port insertion · 1 of 1 slices shown]
[im 1/1]
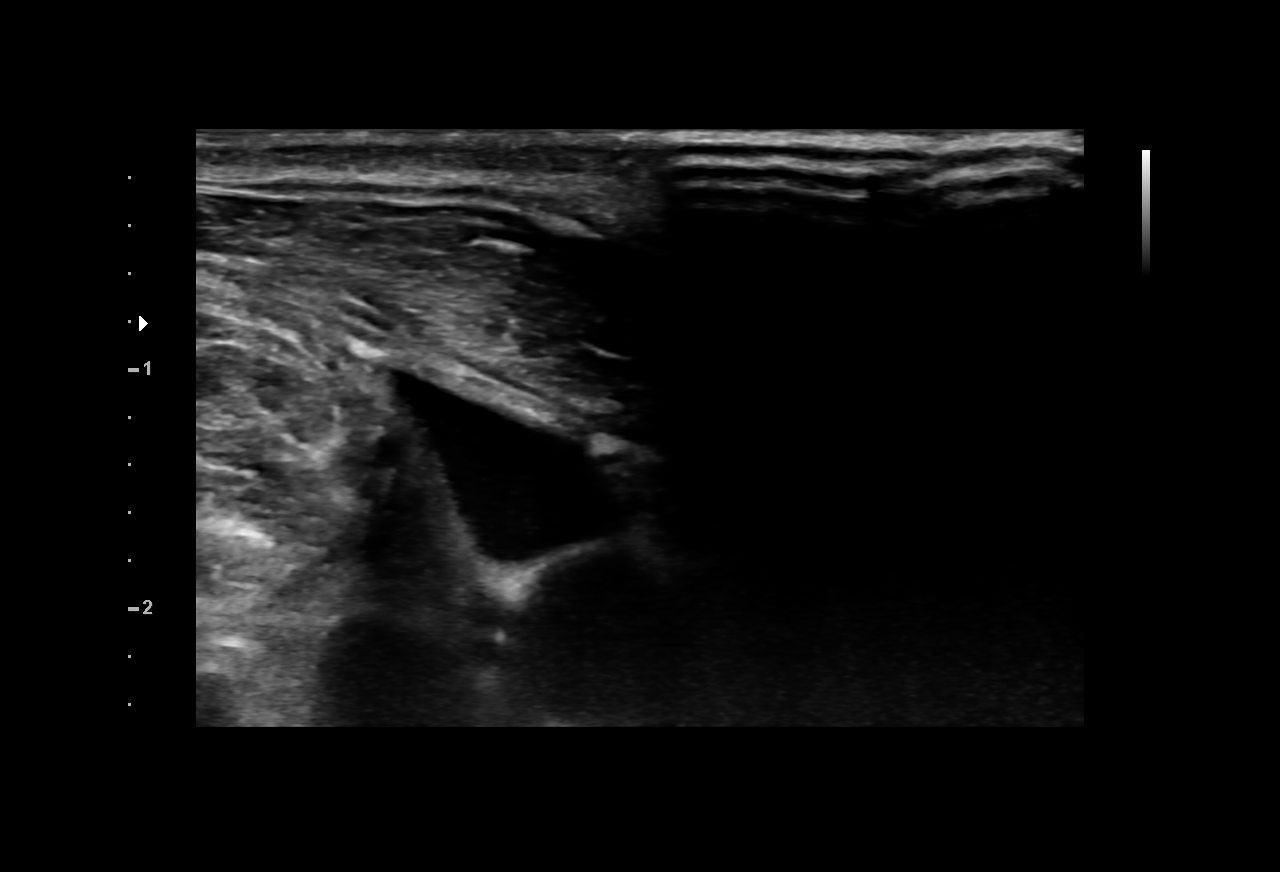

[3 of 3 positions shown; findings below may reference images not displayed]

EXAM:
TUNNELED PORT CATHETER PLACEMENT WITH ULTRASOUND AND FLUOROSCOPIC
GUIDANCE

FLUOROSCOPY TIME:  6 seconds; less than 1 mGy

ANESTHESIA/SEDATION:
Intravenous Fentanyl [4E] and Versed 2mg were administered as
conscious sedation during continuous monitoring of the patient's
level of consciousness and physiological / cardiorespiratory status
by the radiology RN, with a total moderate sedation time of 20
minutes.
Patency of the right IJ vein was confirmed with ultrasound with
image documentation. An appropriate skin site was determined. Skin
site was marked. Region was prepped using maximum barrier technique
including cap and mask, sterile gown, sterile gloves, large sterile
sheet, and Chlorhexidine as cutaneous antisepsis. The region was
infiltrated locally with 1% lidocaine. Under real-time ultrasound
guidance, the right IJ vein was accessed with a 21 gauge
micropuncture needle; the needle tip within the vein was confirmed
with ultrasound image documentation. Needle was exchanged over a 018
guidewire for transitional dilator, and vascular measurement was
performed.

A small incision was made on the right anterior chest wall and a
subcutaneous pocket fashioned. A low profile device was selected.
The power-injectable port was positioned and its catheter tunneled
to the right IJ dermatotomy site. The transitional dilator was
exchanged over an Amplatz wire for a peel-away sheath, through which
the port catheter, which had been trimmed to the appropriate length,
was advanced and positioned under fluoroscopy with its tip at the
cavoatrial junction. Spot chest radiograph confirms good catheter
position and no pneumothorax. The port was flushed per protocol. The
pocket was closed with deep interrupted and subcuticular continuous
3-0 Monocryl sutures. The incisions were covered with Dermabond then
covered with a sterile dressing.

The patient tolerated the procedure well.

COMPLICATIONS:
COMPLICATIONS
None immediate
IMPRESSION: Technically successful right IJ power-injectable port catheter
placement. Ready for routine use.

## 2020-07-15 MED ORDER — LIDOCAINE-EPINEPHRINE 1 %-1:100000 IJ SOLN
INTRAMUSCULAR | Status: AC
  Filled 2020-07-15: qty 1

## 2020-07-15 MED ORDER — HEPARIN SODIUM (PORCINE) 1000 UNIT/ML IJ SOLN
INTRAMUSCULAR | Status: AC
  Filled 2020-07-15: qty 1

## 2020-07-15 MED ORDER — SODIUM CHLORIDE 0.9 % IV SOLN: INTRAVENOUS | Status: DC

## 2020-07-15 MED ORDER — MIDAZOLAM HCL 2 MG/2ML IJ SOLN
INTRAMUSCULAR | Status: AC
  Filled 2020-07-15: qty 2

## 2020-07-15 MED ORDER — FENTANYL CITRATE (PF) 100 MCG/2ML IJ SOLN
INTRAMUSCULAR | Status: AC | PRN
  Administered 2020-07-15 (×2): 50 ug via INTRAVENOUS

## 2020-07-15 MED ORDER — SODIUM CHLORIDE 0.9 % IV SOLN
INTRAVENOUS | Status: AC | PRN
  Administered 2020-07-15: 10 mL/h via INTRAVENOUS

## 2020-07-15 MED ORDER — HEPARIN SOD (PORK) LOCK FLUSH 100 UNIT/ML IV SOLN
INTRAVENOUS | Status: AC
  Filled 2020-07-15: qty 5

## 2020-07-15 MED ORDER — FENTANYL CITRATE (PF) 100 MCG/2ML IJ SOLN
INTRAMUSCULAR | Status: AC
  Filled 2020-07-15: qty 2

## 2020-07-15 MED ORDER — MIDAZOLAM HCL 2 MG/2ML IJ SOLN
INTRAMUSCULAR | Status: AC | PRN
  Administered 2020-07-15: 1 mg via INTRAVENOUS
  Administered 2020-07-15: 0.5 mg via INTRAVENOUS

## 2020-07-15 NOTE — H&P (Signed)
Chief Complaint: Albert Flowers was seen in consultation today for head and neck cancer  Referring Physician(s): [name],Praveena  Supervising Physician: Arne Cleveland  Albert Flowers Status: [facility] - Out-pt  History of Present Illness: Albert Flowers is a 50 y.o. 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 Albert Flowers has been undergoing radiation treatments, but now has plans to initiate chemotherapy.  Albert Flowers is in need of durable venous access.  IR consulted for Port-A-Cath placement at the request of Dr. Chryl Heck.   Albert Flowers presents for procedure today in his usual state of health.  Albert Flowers denies new concerns.  Albert Flowers has been NPO.  Denies fever, chills, nausea, vomting, abdominal pain, dysuria.   Past Medical History:  Diagnosis Date  . Diabetes mellitus without complication [facility])     Past Surgical History:  Procedure Laterality Date  . left neck dissection Left 05/04/2019   Left Neck Dissection by Dr. [name] at [facility]   . neck sugery     as a child "had a knot removed from neck" not sure which side.   . RIght neck dissection Right 06/16/2018   TORS and right neck dissection. Dr. [name] at [facility]    Allergies: Penicillins  Medications: Prior to Admission medications   Medication Sig Start Date End Date Taking? Authorizing Provider  gabapentin (NEURONTIN) 300 MG capsule Take 900 mg by mouth 3 (three) times daily.   Yes [provider]  methocarbamol (ROBAXIN) 750 MG tablet Take 1 tablet (750 mg total) by mouth every 6 (six) hours as needed for muscle spasms. 05/17/20  Yes Mcarthur Rossetti, MD  mirtazapine (REMERON) 15 MG tablet Take 1 tablet (15 mg total) by mouth at bedtime. 06/23/20  Yes Benay Pike, MD  omeprazole (PRILOSEC) 40 MG capsule Take 1 capsule (40 mg total) by mouth daily. 07/05/20  Yes Tanner, Lyndon Code., PA-C  oxycodone (OXY-IR) 5 MG capsule Take 5 mg by mouth every 6 (six) hours as needed.   Yes [provider]  prochlorperazine (COMPAZINE) 10 MG tablet Take 1 tablet (10 mg total) by mouth every 6 (six) hours as needed (Nausea or vomiting). 07/12/20  Yes [name], [name], MD  dexamethasone (DECADRON) 4 MG tablet Take 2 tablets (8 mg total) by mouth daily. Start the day after carboplatin chemotherapy for 3 days. 07/12/20   Benay Pike, MD  diphenhydrAMINE (BENADRYL) 25 MG tablet Take 25 mg by mouth at bedtime.    [provider]  lidocaine-prilocaine (EMLA) cream Apply to affected area once 07/12/20   Benay Pike, MD  nystatin (MYCOSTATIN) 100000 UNIT/ML suspension Take 5 mLs (500,000 Units total) by mouth 4 (four) times daily. Swish and spit or swallow until better. Albert Flowers not taking: No sig reported 07/29/19   Harle Stanford., PA-C  ondansetron (ZOFRAN ODT) 8 MG disintegrating tablet Take 1 tablet (8 mg total) by mouth every 8 (eight) hours as needed for nausea or vomiting. 05/06/20   [name], MD  sodium fluoride (PREVIDENT 5000 PLUS) 1.1 % CREA dental cream Albert Flowers to use as directed on a toothbrush or alternatively in his fluoride trays as instructed.  Albert Flowers is to spit out excess and not swallow.  Repeat nightly. Albert Flowers not taking: No sig reported 06/01/19   Lenn Cal, DDS  sucralfate (CARAFATE) 1 g tablet Dissolve 1 tablet in 10 mL H20 and swallow 20 min prior to meals and bedtime. Albert Flowers not taking: No sig reported 07/06/19   [name], MD  History reviewed. No pertinent family history.  Social History   Socioeconomic History  . Marital status: Married    Spouse name: Not on file  . Number of children: 4  . Years of education: Not on file  . Highest education level: Not on file  Occupational History  . Not on file  Tobacco Use  . Smoking status: Never Smoker  . Smokeless tobacco: Never Used  Vaping Use  . Vaping Use: Never used  Substance and Sexual Activity  . Alcohol use: Yes    Comment: occasional  . Drug use: Never  . Sexual  activity: Not on file  Other Topics Concern  . Not on file  Social History Narrative  . Not on file   Social Determinants of Health   Financial Resource Strain: Not on file  Food Insecurity: Not on file  Transportation Needs: Not on file  Physical Activity: Not on file  Stress: Not on file  Social Connections: Not on file     Review of Systems: A 12 point ROS discussed and pertinent positives are indicated in the HPI above.  All other systems are negative.  Review of Systems  Constitutional: Negative for fatigue and fever.  Respiratory: Negative for cough and shortness of breath.   Cardiovascular: Negative for chest pain.  Gastrointestinal: Negative for abdominal pain, diarrhea, nausea and vomiting.  Genitourinary: Negative for dysuria.  Musculoskeletal: Negative for back pain.  Psychiatric/Behavioral: Negative for behavioral problems and confusion.    Vital Signs: BP 113/84   Pulse 95   Temp 98.3 F (36.8 C) (Oral)   Resp 16   Ht 5\' 10"  (1.778 m)   Wt 150 lb 8 oz (68.3 kg)   SpO2 100%   BMI 21.59 kg/m   Physical Exam Vitals and nursing note reviewed.  Constitutional:      General: Albert Flowers is not in acute distress.    Appearance: Normal appearance. Albert Flowers is normal weight. Albert Flowers is not ill-appearing.  HENT:     Mouth/Throat:     Mouth: Mucous membranes are moist.     Pharynx: Oropharynx is clear.  Cardiovascular:     Rate and Rhythm: Normal rate and regular rhythm.  Pulmonary:     Effort: Pulmonary effort is normal.     Breath sounds: Normal breath sounds.  Abdominal:     General: Abdomen is flat.     Palpations: Abdomen is soft.  Musculoskeletal:     Cervical back: Normal range of motion and neck supple.  Skin:    General: Skin is warm and dry.  Neurological:     General: No focal deficit present.     Mental Status: Albert Flowers is alert and oriented to person, place, and time. Mental status is at baseline.  Psychiatric:        Mood and Affect: Mood normal.         Behavior: Behavior normal.        Thought Content: Thought content normal.        Judgment: Judgment normal.      MD Evaluation Airway: WNL Heart: WNL Abdomen: WNL Chest/ Lungs: WNL ASA  Classification: 3 Mallampati/Airway Score: Two   Imaging: No results found.  Labs:  CBC: Recent Labs    05/11/20 1201 05/12/20 1147 06/24/20 1518 07/12/20 1214  WBC 5.0 4.4 2.0* 4.5  HGB 12.5* 13.8 10.2* 9.5*  HCT 37.9* 41.5 30.6* 28.9*  PLT 339 362 145* 189    COAGS: Recent Labs    05/12/20 1147  INR 0.9    BMP: Recent Labs    07/29/19 1316 11/12/19 1144 05/11/20 1201 05/12/20 1147 06/24/20 1518 07/12/20 1214  NA 139  --  136 135 139 138  K 4.4  --  4.3 4.4 4.5 4.3  CL 99  --  100 97* 100 101  CO2 28  --  28 27 27 29   GLUCOSE 158*  --  106* 137* 115* 100*  BUN 20 9 12 14 13 8   CALCIUM 9.9  --  9.5 9.9 8.8* 9.1  CREATININE 0.99 0.84 0.94 0.90 0.79 0.74  GFRNONAA >60 >60 >60 >60 >60 >60  GFRAA >60 >60  --   --   --   --     LIVER FUNCTION TESTS: Recent Labs    05/11/20 1201 05/12/20 1147 06/24/20 1518 07/12/20 1214  BILITOT 0.2* 0.6 0.3 0.3  AST 24 26 43* 40  ALT 8 11 39 25  ALKPHOS 134* 137* 109 149*  PROT 7.5 8.3* 7.1 7.1  ALBUMIN 3.9 4.1 3.7 3.5    TUMOR MARKERS: No results for input(s): AFPTM, CEA, CA199, CHROMGRNA in the last 8760 hours.  Assessment and Plan: Albert Flowers with past medical history of DM presents with complaint of SCC of the oropharynx. Albert Flowers has plans to start chemotherapy and is in need of durable venous access.  IR consulted for Port-A-Cath placement at the request of Dr. Chryl Heck. Case reviewed by Dr. Vernard Gambles who approves Albert Flowers for procedure.  Albert Flowers presents today in their usual state of health.  Albert Flowers has been NPO and is not currently on blood thinners.   Risks and benefits of image guided port-a-catheter placement was discussed with the Albert Flowers including, but not limited to bleeding, infection, pneumothorax, or fibrin sheath  development and need for additional procedures.  All of the Albert Flowers's questions were answered, Albert Flowers is agreeable to proceed. Consent signed and in chart.  Thank you for this interesting consult.  I greatly enjoyed meeting Quincey Quesinberry and look forward to participating in their care.  A copy of this report was sent to the requesting provider on this date.  Electronically Signed: Docia Barrier, PA 07/15/2020, 7:57 AM   I spent a total of  30 Minutes   in face to face in clinical consultation, greater than 50% of which was counseling/coordinating care for oropharyngeal cancer.

## 2020-07-15 NOTE — Telephone Encounter (Signed)
Appts were scheduled per 4/26 sch msg. Pt's wife is aware. Practice administrator will reach out to pt's wife to go over some of the concerns she has in scheduling pt's appts.

## 2020-07-15 NOTE — Discharge Instructions (Addendum)
Implanted Port Insertion, Care After This sheet gives you information about how to care for yourself after your procedure. Your health care provider may also give you more specific instructions. If you have problems or questions, contact your health care provider. What can I expect after the procedure? After the procedure, it is common to have:  Discomfort at the port insertion site.  Bruising on the skin over the port. This should improve over 3-4 days. Follow these instructions at home: Port care  After your port is placed, you will get a manufacturer's information card. The card has information about your port. Keep this card with you at all times.  Take care of the port as told by your health care provider. Ask your health care provider if you or a family member can get training for taking care of the port at home. A home health care nurse may also take care of the port.  Make sure to remember what type of port you have. Incision care  Follow instructions from your health care provider about how to take care of your port insertion site. Make sure you: ? Wash your hands with soap and water before and after you change your bandage (dressing). If soap and water are not available, use hand sanitizer. ? Remove your dressing as told by your health care provider. In 48 hours ? Leave stitches (sutures), skin glue, or adhesive strips in place. These skin closures may need to stay in place for 2 weeks or longer. If adhesive strip edges start to loosen and curl up, you may trim the loose edges. Do not remove adhesive strips completely unless your health care provider tells you to do that.  Check your port insertion site every day for signs of infection. Check for: ? Redness, swelling, or pain. ? Fluid or blood. ? Warmth. ? Pus or a bad smell.      Activity  Return to your normal activities as told by your health care provider. Ask your health care provider what activities are safe for  you.  Do not lift anything that is heavier than 10 lb (4.5 kg), or the limit that you are told, until your health care provider says that it is safe. General instructions  Take over-the-counter and prescription medicines only as told by your health care provider.  Do not take baths, swim, or use a hot tub until your health care provider approves. Ask your health care provider if you may take showers. You may only be allowed to take sponge baths.  Do not drive for 24 hours if you were given a sedative during your procedure.  Wear a medical alert bracelet in case of an emergency. This will tell any health care providers that you have a port.  Keep all follow-up visits as told by your health care provider. This is important. Contact a health care provider if:  You cannot flush your port with saline as directed, or you cannot draw blood from the port.  You have a fever or chills.  You have redness, swelling, or pain around your port insertion site.  You have fluid or blood coming from your port insertion site.  Your port insertion site feels warm to the touch.  You have pus or a bad smell coming from the port insertion site. Get help right away if:  You have chest pain or shortness of breath.  You have bleeding from your port that you cannot control. Summary  Take care of the port as   told by your health care provider. Keep the manufacturer's information card with you at all times.  Change your dressing as told by your health care provider.  Contact a health care provider if you have a fever or chills or if you have redness, swelling, or pain around your port insertion site.  Keep all follow-up visits as told by your health care provider. This information is not intended to replace advice given to you by your health care provider. Make sure you discuss any questions you have with your health care provider. Document Revised: 10/01/2017 Document Reviewed: 10/01/2017 Elsevier  Patient Education  2021 [place].  Moderate Conscious Sedation, Adult, Care After This sheet gives you information about how to care for yourself after your procedure. Your health care provider may also give you more specific instructions. If you have problems or questions, contact your health care provider. What can I expect after the procedure? After the procedure, it is common to have:  Sleepiness for several hours.  Impaired judgment for several hours.  Difficulty with balance.  Vomiting if you eat too soon. Follow these instructions at home: For the time period you were told by your health care provider:  Rest.  Do not participate in activities where you could fall or become injured.  Do not drive or use machinery.  Do not drink alcohol.  Do not take sleeping pills or medicines that cause drowsiness.  Do not make important decisions or sign legal documents.  Do not take care of children on your own.      Eating and drinking  Follow the diet recommended by your health care provider.  Drink enough fluid to keep your urine pale yellow.  If you vomit: ? Drink water, juice, or soup when you can drink without vomiting. ? Make sure you have little or no nausea before eating solid foods.   General instructions  Take over-the-counter and prescription medicines only as told by your health care provider.  Have a responsible adult stay with you for the time you are told. It is important to have someone help care for you until you are awake and alert.  Do not smoke.  Keep all follow-up visits as told by your health care provider. This is important. Contact a health care provider if:  You are still sleepy or having trouble with balance after 24 hours.  You feel light-headed.  You keep feeling nauseous or you keep vomiting.  You develop a rash.  You have a fever.  You have redness or swelling around the IV site. Get help right away if:  You have trouble  breathing.  You have new-onset confusion at home. Summary  After the procedure, it is common to feel sleepy, have impaired judgment, or feel nauseous if you eat too soon.  Rest after you get home. Know the things you should not do after the procedure.  Follow the diet recommended by your health care provider and drink enough fluid to keep your urine pale yellow.  Get help right away if you have trouble breathing or new-onset confusion at home. This information is not intended to replace advice given to you by your health care provider. Make sure you discuss any questions you have with your health care provider. Document Revised: 07/03/2019 Document Reviewed: 01/29/2019 Elsevier Patient Education  2021 [employer].

## 2020-07-15 NOTE — Procedures (Signed)
  Procedure: R IJ Port catheter placement   EBL:   minimal Complications:  none immediate  See full dictation in [facility] PACS.  D. Wynne Jury MD Main # [phone] Pager  [phone] Mobile [phone]     

## 2020-07-17 ENCOUNTER — Other Ambulatory Visit: Payer: Self-pay | Admitting: Medical

## 2020-07-17 ENCOUNTER — Other Ambulatory Visit: Payer: Self-pay | Admitting: Hematology and Oncology

## 2020-07-18 ENCOUNTER — Inpatient Hospital Stay: Payer: Managed Care, Other (non HMO)

## 2020-07-18 ENCOUNTER — Inpatient Hospital Stay: Payer: Managed Care, Other (non HMO) | Attending: Hematology and Oncology

## 2020-07-18 ENCOUNTER — Encounter: Payer: Self-pay | Admitting: Hematology and Oncology

## 2020-07-18 ENCOUNTER — Other Ambulatory Visit: Payer: Self-pay

## 2020-07-18 VITALS — BP 103/69 | HR 85 | Temp 98.4°F | Resp 16

## 2020-07-18 DIAGNOSIS — R634 Abnormal weight loss: Secondary | ICD-10-CM | POA: Diagnosis not present

## 2020-07-18 DIAGNOSIS — C01 Malignant neoplasm of base of tongue: Secondary | ICD-10-CM | POA: Insufficient documentation

## 2020-07-18 DIAGNOSIS — Z923 Personal history of irradiation: Secondary | ICD-10-CM | POA: Insufficient documentation

## 2020-07-18 DIAGNOSIS — C090 Malignant neoplasm of tonsillar fossa: Secondary | ICD-10-CM

## 2020-07-18 DIAGNOSIS — K1231 Oral mucositis (ulcerative) due to antineoplastic therapy: Secondary | ICD-10-CM | POA: Insufficient documentation

## 2020-07-18 DIAGNOSIS — Z5112 Encounter for antineoplastic immunotherapy: Secondary | ICD-10-CM | POA: Insufficient documentation

## 2020-07-18 DIAGNOSIS — T451X5A Adverse effect of antineoplastic and immunosuppressive drugs, initial encounter: Secondary | ICD-10-CM | POA: Insufficient documentation

## 2020-07-18 DIAGNOSIS — R066 Hiccough: Secondary | ICD-10-CM | POA: Insufficient documentation

## 2020-07-18 DIAGNOSIS — C787 Secondary malignant neoplasm of liver and intrahepatic bile duct: Secondary | ICD-10-CM | POA: Insufficient documentation

## 2020-07-18 DIAGNOSIS — Z5111 Encounter for antineoplastic chemotherapy: Secondary | ICD-10-CM | POA: Insufficient documentation

## 2020-07-18 DIAGNOSIS — Z95828 Presence of other vascular implants and grafts: Secondary | ICD-10-CM

## 2020-07-18 DIAGNOSIS — C7951 Secondary malignant neoplasm of bone: Secondary | ICD-10-CM | POA: Insufficient documentation

## 2020-07-18 LAB — CMP (CANCER CENTER ONLY)
ALT: 29 U/L (ref 0–44)
AST: 43 U/L — ABNORMAL HIGH (ref 15–41)
Albumin: 3.3 g/dL — ABNORMAL LOW (ref 3.5–5.0)
Alkaline Phosphatase: 169 U/L — ABNORMAL HIGH (ref 38–126)
Anion gap: 8 (ref 5–15)
BUN: 7 mg/dL (ref 6–20)
CO2: 28 mmol/L (ref 22–32)
Calcium: 9.2 mg/dL (ref 8.9–10.3)
Chloride: 100 mmol/L (ref 98–111)
Creatinine: 0.69 mg/dL (ref 0.61–1.24)
GFR, Estimated: >60 mL/min (ref >60)
Glucose, Bld: 113 mg/dL — ABNORMAL HIGH (ref 70–99)
Potassium: 4.2 mmol/L (ref 3.5–5.1)
Sodium: 136 mmol/L (ref 135–145)
Total Bilirubin: 0.4 mg/dL (ref 0.3–1.2)
Total Protein: 7 g/dL (ref 6.5–8.1)

## 2020-07-18 LAB — CBC WITH DIFFERENTIAL (CANCER CENTER ONLY)
Abs Immature Granulocytes: 0.08 10*3/uL — ABNORMAL HIGH (ref 0.00–0.07)
Basophils Absolute: 0 10*3/uL (ref 0.0–0.1)
Basophils Relative: 0 %
Eosinophils Absolute: 0.6 10*3/uL — ABNORMAL HIGH (ref 0.0–0.5)
Eosinophils Relative: 12 %
HCT: 28.4 % — ABNORMAL LOW (ref 39.0–52.0)
Hemoglobin: 9.4 g/dL — ABNORMAL LOW (ref 13.0–17.0)
Immature Granulocytes: 2 %
Lymphocytes Relative: 7 %
Lymphs Abs: 0.3 10*3/uL — ABNORMAL LOW (ref 0.7–4.0)
MCH: 31.2 pg (ref 26.0–34.0)
MCHC: 33.1 g/dL (ref 30.0–36.0)
MCV: 94.4 fL (ref 80.0–100.0)
Monocytes Absolute: 0.8 10*3/uL (ref 0.1–1.0)
Monocytes Relative: 15 %
Neutro Abs: 3.2 10*3/uL (ref 1.7–7.7)
Neutrophils Relative %: 64 %
Platelet Count: 197 10*3/uL (ref 150–400)
RBC: 3.01 MIL/uL — ABNORMAL LOW (ref 4.22–5.81)
RDW: 16.3 % — ABNORMAL HIGH (ref 11.5–15.5)
WBC Count: 5 10*3/uL (ref 4.0–10.5)
nRBC: 0 % (ref 0.0–0.2)

## 2020-07-18 LAB — TSH: TSH: 2.237 u[IU]/mL (ref 0.350–4.500)

## 2020-07-18 MED ORDER — SODIUM CHLORIDE 0.9 % IV SOLN
10.0000 mg | Freq: Once | INTRAVENOUS | Status: AC
  Administered 2020-07-18: 10 mg via INTRAVENOUS
  Filled 2020-07-18: qty 10

## 2020-07-18 MED ORDER — SODIUM CHLORIDE 0.9 % IV SOLN
200.0000 mg | Freq: Once | INTRAVENOUS | Status: AC
  Administered 2020-07-18: 200 mg via INTRAVENOUS
  Filled 2020-07-18: qty 8

## 2020-07-18 MED ORDER — PALONOSETRON HCL INJECTION 0.25 MG/5ML
INTRAVENOUS | Status: AC
  Filled 2020-07-18: qty 5

## 2020-07-18 MED ORDER — SODIUM CHLORIDE 0.9 % IV SOLN
150.0000 mg | Freq: Once | INTRAVENOUS | Status: AC
  Administered 2020-07-18: 150 mg via INTRAVENOUS
  Filled 2020-07-18: qty 150

## 2020-07-18 MED ORDER — SODIUM CHLORIDE 0.9 % IV SOLN
1000.0000 mg/m2/d | INTRAVENOUS | Status: DC
  Administered 2020-07-18: 7350 mg via INTRAVENOUS
  Filled 2020-07-18: qty 147

## 2020-07-18 MED ORDER — SODIUM CHLORIDE 0.9% FLUSH
10.0000 mL | INTRAVENOUS | Status: DC | PRN
  Filled 2020-07-18: qty 10

## 2020-07-18 MED ORDER — PALONOSETRON HCL INJECTION 0.25 MG/5ML
0.2500 mg | Freq: Once | INTRAVENOUS | Status: AC
  Administered 2020-07-18: 0.25 mg via INTRAVENOUS

## 2020-07-18 MED ORDER — SODIUM CHLORIDE 0.9 % IV SOLN
520.0000 mg | Freq: Once | INTRAVENOUS | Status: AC
  Administered 2020-07-18: 520 mg via INTRAVENOUS
  Filled 2020-07-18: qty 52

## 2020-07-18 MED ORDER — SODIUM CHLORIDE 0.9 % IV SOLN
Freq: Once | INTRAVENOUS | Status: AC
Start: 2020-07-18 — End: 2020-07-18
  Filled 2020-07-18: qty 250

## 2020-07-18 MED ORDER — SODIUM CHLORIDE 0.9% FLUSH
10.0000 mL | Freq: Once | INTRAVENOUS | Status: AC
Start: 2020-07-18 — End: 2020-07-18
  Administered 2020-07-18: 10 mL via INTRAVENOUS
  Filled 2020-07-18: qty 10

## 2020-07-18 NOTE — Patient Instructions (Addendum)
[facility]  Discharge Instructions: Thank you for choosing [facility] to provide your oncology and hematology care.   If you have a lab appointment with the [facility], please go directly to the [facility] and check in at the registration area.   Wear comfortable clothing and clothing appropriate for easy access to any Portacath or PICC line.   We strive to give you quality time with your provider. You may need to reschedule your appointment if you arrive late (15 or more minutes).  Arriving late affects you and other patients whose appointments are after yours.  Also, if you miss three or more appointments without notifying the office, you may be dismissed from the clinic at the provider's discretion.      For prescription refill requests, have your pharmacy contact our office and allow 72 hours for refills to be completed.    Today you received the following chemotherapy and/or immunotherapy agents: Keytruda/Carbo/Adrucil.      To help prevent nausea and vomiting after your treatment, we encourage you to take your nausea medication as directed.  BELOW ARE SYMPTOMS THAT SHOULD BE REPORTED IMMEDIATELY: . *FEVER GREATER THAN 100.4 F (38 C) OR HIGHER . *CHILLS OR SWEATING . *NAUSEA AND VOMITING THAT IS NOT CONTROLLED WITH YOUR NAUSEA MEDICATION . *UNUSUAL SHORTNESS OF BREATH . *UNUSUAL BRUISING OR BLEEDING . *URINARY PROBLEMS (pain or burning when urinating, or frequent urination) . *BOWEL PROBLEMS (unusual diarrhea, constipation, pain near the anus) . TENDERNESS IN MOUTH AND THROAT WITH OR WITHOUT PRESENCE OF ULCERS (sore throat, sores in mouth, or a toothache) . UNUSUAL RASH, SWELLING OR PAIN  . UNUSUAL VAGINAL DISCHARGE OR ITCHING   Items with * indicate a potential emergency and should be followed up as soon as possible or go to the Emergency Department if any problems should occur.  Please show the CHEMOTHERAPY ALERT CARD or  IMMUNOTHERAPY ALERT CARD at check-in to the Emergency Department and triage nurse.  Should you have questions after your visit or need to cancel or reschedule your appointment, please contact [facility]  Dept: [phone]  and follow the prompts.  Office hours are 8:00 a.m. to 4:30 p.m. Monday - Friday. Please note that voicemails left after 4:00 p.m. may not be returned until the following business day.  We are closed weekends and major holidays. You have access to a nurse at all times for urgent questions. Please call the main number to the clinic Dept: [phone] and follow the prompts.   For any non-urgent questions, you may also contact your provider using MyChart. We now offer e-Visits for anyone 78 and older to request care online for non-urgent symptoms. For details visit [email or web address].   Also download the MyChart app! Go to the app store, search "MyChart", open the app, select [facility], and log in with your MyChart username and password.  Due to Covid, a mask is required upon entering the hospital/clinic. If you do not have a mask, one will be given to you upon arrival. For doctor visits, patients may have 1 support person aged 57 or older with them. For treatment visits, patients cannot have anyone with them due to current Covid guidelines and our immunocompromised population.   Pembrolizumab injection What is this medicine? PEMBROLIZUMAB (pem broe liz ue mab) is a monoclonal antibody. It is used to treat certain types of cancer. This medicine may be used for other purposes; ask your health care provider or  pharmacist if you have questions. COMMON BRAND NAME(S): Keytruda What should I tell my health care provider before I take this medicine? They need to know if you have any of these conditions:  autoimmune diseases like Crohn's disease, ulcerative colitis, or lupus  have had or planning to have an allogeneic stem cell transplant (uses  someone else's stem cells)  history of organ transplant  history of chest radiation  nervous system problems like myasthenia gravis or Guillain-Barre syndrome  an unusual or allergic reaction to pembrolizumab, other medicines, foods, dyes, or preservatives  pregnant or trying to get pregnant  breast-feeding How should I use this medicine? This medicine is for infusion into a vein. It is given by a health care professional in a hospital or clinic setting. A special MedGuide will be given to you before each treatment. Be sure to read this information carefully each time. Talk to your pediatrician regarding the use of this medicine in children. While this drug may be prescribed for children as young as 6 months for selected conditions, precautions do apply. Overdosage: If you think you have taken too much of this medicine contact a poison control center or emergency room at once. NOTE: This medicine is only for you. Do not share this medicine with others. What if I miss a dose? It is important not to miss your dose. Call your doctor or health care professional if you are unable to keep an appointment. What may interact with this medicine? Interactions have not been studied. This list may not describe all possible interactions. Give your health care provider a list of all the medicines, herbs, non-prescription drugs, or dietary supplements you use. Also tell them if you smoke, drink alcohol, or use illegal drugs. Some items may interact with your medicine. What should I watch for while using this medicine? Your condition will be monitored carefully while you are receiving this medicine. You may need blood work done while you are taking this medicine. Do not become pregnant while taking this medicine or for 4 months after stopping it. Women should inform their doctor if they wish to become pregnant or think they might be pregnant. There is a potential for serious side effects to an unborn  child. Talk to your health care professional or pharmacist for more information. Do not breast-feed an infant while taking this medicine or for 4 months after the last dose. What side effects may I notice from receiving this medicine? Side effects that you should report to your doctor or health care professional as soon as possible:  allergic reactions like skin rash, itching or hives, swelling of the face, lips, or tongue  bloody or black, tarry  breathing problems  changes in vision  chest pain  chills  confusion  constipation  cough  diarrhea  dizziness or feeling faint or lightheaded  fast or irregular heartbeat  fever  flushing  joint pain  low blood counts - this medicine may decrease the number of white blood cells, red blood cells and platelets. You may be at increased risk for infections and bleeding.  muscle pain  muscle weakness  pain, tingling, numbness in the hands or feet  persistent headache  redness, blistering, peeling or loosening of the skin, including inside the mouth  signs and symptoms of high blood sugar such as dizziness; dry mouth; dry skin; fruity breath; nausea; stomach pain; increased hunger or thirst; increased urination  signs and symptoms of kidney injury like trouble passing urine or change in the  amount of urine  signs and symptoms of liver injury like dark urine, light-colored stools, loss of appetite, nausea, right upper belly pain, yellowing of the eyes or skin  sweating  swollen lymph nodes  weight loss Side effects that usually do not require medical attention (report to your doctor or health care professional if they continue or are bothersome):  decreased appetite  hair loss  tiredness This list may not describe all possible side effects. Call your doctor for medical advice about side effects. You may report side effects to FDA at 1-800-FDA-1088. Where should I keep my medicine? This drug is given in a hospital  or clinic and will not be stored at home. NOTE: This sheet is a summary. It may not cover all possible information. If you have questions about this medicine, talk to your doctor, pharmacist, or health care provider.  2021 Elsevier/Gold Standard (2019-02-04 21:44:53)  Carboplatin injection What is this medicine? CARBOPLATIN (KAR boe pla tin) is a chemotherapy drug. It targets fast dividing cells, like cancer cells, and causes these cells to die. This medicine is used to treat ovarian cancer and many other cancers. This medicine may be used for other purposes; ask your health care provider or pharmacist if you have questions. COMMON BRAND NAME(S): Paraplatin What should I tell my health care provider before I take this medicine? They need to know if you have any of these conditions:  blood disorders  hearing problems  kidney disease  recent or ongoing radiation therapy  an unusual or allergic reaction to carboplatin, cisplatin, other chemotherapy, other medicines, foods, dyes, or preservatives  pregnant or trying to get pregnant  breast-feeding How should I use this medicine? This drug is usually given as an infusion into a vein. It is administered in a hospital or clinic by a specially trained health care professional. Talk to your pediatrician regarding the use of this medicine in children. Special care may be needed. Overdosage: If you think you have taken too much of this medicine contact a poison control center or emergency room at once. NOTE: This medicine is only for you. Do not share this medicine with others. What if I miss a dose? It is important not to miss a dose. Call your doctor or health care professional if you are unable to keep an appointment. What may interact with this medicine?  medicines for seizures  medicines to increase blood counts like filgrastim, pegfilgrastim, sargramostim  some antibiotics like amikacin, gentamicin, neomycin, streptomycin,  tobramycin  vaccines Talk to your doctor or health care professional before taking any of these medicines:  acetaminophen  aspirin  ibuprofen  ketoprofen  naproxen This list may not describe all possible interactions. Give your health care provider a list of all the medicines, herbs, non-prescription drugs, or dietary supplements you use. Also tell them if you smoke, drink alcohol, or use illegal drugs. Some items may interact with your medicine. What should I watch for while using this medicine? Your condition will be monitored carefully while you are receiving this medicine. You will need important blood work done while you are taking this medicine. This drug may make you feel generally unwell. This is not uncommon, as chemotherapy can affect healthy cells as well as cancer cells. Report any side effects. Continue your course of treatment even though you feel ill unless your doctor tells you to stop. In some cases, you may be given additional medicines to help with side effects. Follow all directions for their use. Call your  doctor or health care professional for advice if you get a fever, chills or sore throat, or other symptoms of a cold or flu. Do not treat yourself. This drug decreases your body's ability to fight infections. Try to avoid being around people who are sick. This medicine may increase your risk to bruise or bleed. Call your doctor or health care professional if you notice any unusual bleeding. Be careful brushing and flossing your teeth or using a toothpick because you may get an infection or bleed more easily. If you have any dental work done, tell your dentist you are receiving this medicine. Avoid taking products that contain aspirin, acetaminophen, ibuprofen, naproxen, or ketoprofen unless instructed by your doctor. These medicines may hide a fever. Do not become pregnant while taking this medicine. Women should inform their doctor if they wish to become pregnant or  think they might be pregnant. There is a potential for serious side effects to an unborn child. Talk to your health care professional or pharmacist for more information. Do not breast-feed an infant while taking this medicine. What side effects may I notice from receiving this medicine? Side effects that you should report to your doctor or health care professional as soon as possible:  allergic reactions like skin rash, itching or hives, swelling of the face, lips, or tongue  signs of infection - fever or chills, cough, sore throat, pain or difficulty passing urine  signs of decreased platelets or bleeding - bruising, pinpoint red spots on the skin, black, tarry stools, nosebleeds  signs of decreased red blood cells - unusually weak or tired, fainting spells, lightheadedness  breathing problems  changes in hearing  changes in vision  chest pain  high blood pressure  low blood counts - This drug may decrease the number of white blood cells, red blood cells and platelets. You may be at increased risk for infections and bleeding.  nausea and vomiting  pain, swelling, redness or irritation at the injection site  pain, tingling, numbness in the hands or feet  problems with balance, talking, walking  trouble passing urine or change in the amount of urine Side effects that usually do not require medical attention (report to your doctor or health care professional if they continue or are bothersome):  hair loss  loss of appetite  metallic taste in the mouth or changes in taste This list may not describe all possible side effects. Call your doctor for medical advice about side effects. You may report side effects to FDA at 1-800-FDA-1088. Where should I keep my medicine? This drug is given in a hospital or clinic and will not be stored at home. NOTE: This sheet is a summary. It may not cover all possible information. If you have questions about this medicine, talk to your doctor,  pharmacist, or health care provider.  2021 Elsevier/Gold Standard (2007-06-10 14:38:05)  Fluorouracil, 5-FU injection What is this medicine? FLUOROURACIL, 5-FU (flure oh YOOR a sil) is a chemotherapy drug. It slows the growth of cancer cells. This medicine is used to treat many types of cancer like breast cancer, colon or rectal cancer, pancreatic cancer, and stomach cancer. This medicine may be used for other purposes; ask your health care provider or pharmacist if you have questions. COMMON BRAND NAME(S): Adrucil What should I tell my health care provider before I take this medicine? They need to know if you have any of these conditions:  blood disorders  dihydropyrimidine dehydrogenase (DPD) deficiency  infection (especially a virus infection  such as chickenpox, cold sores, or herpes)  kidney disease  liver disease  malnourished, poor nutrition  recent or ongoing radiation therapy  an unusual or allergic reaction to fluorouracil, other chemotherapy, other medicines, foods, dyes, or preservatives  pregnant or trying to get pregnant  breast-feeding How should I use this medicine? This drug is given as an infusion or injection into a vein. It is administered in a hospital or clinic by a specially trained health care professional. Talk to your pediatrician regarding the use of this medicine in children. Special care may be needed. Overdosage: If you think you have taken too much of this medicine contact a poison control center or emergency room at once. NOTE: This medicine is only for you. Do not share this medicine with others. What if I miss a dose? It is important not to miss your dose. Call your doctor or health care professional if you are unable to keep an appointment. What may interact with this medicine? Do not take this medicine with any of the following medications:  live virus vaccines This medicine may also interact with the following medications:  medicines  that treat or prevent blood clots like warfarin, enoxaparin, and dalteparin This list may not describe all possible interactions. Give your health care provider a list of all the medicines, herbs, non-prescription drugs, or dietary supplements you use. Also tell them if you smoke, drink alcohol, or use illegal drugs. Some items may interact with your medicine. What should I watch for while using this medicine? Visit your doctor for checks on your progress. This drug may make you feel generally unwell. This is not uncommon, as chemotherapy can affect healthy cells as well as cancer cells. Report any side effects. Continue your course of treatment even though you feel ill unless your doctor tells you to stop. In some cases, you may be given additional medicines to help with side effects. Follow all directions for their use. Call your doctor or health care professional for advice if you get a fever, chills or sore throat, or other symptoms of a cold or flu. Do not treat yourself. This drug decreases your body's ability to fight infections. Try to avoid being around people who are sick. This medicine may increase your risk to bruise or bleed. Call your doctor or health care professional if you notice any unusual bleeding. Be careful brushing and flossing your teeth or using a toothpick because you may get an infection or bleed more easily. If you have any dental work done, tell your dentist you are receiving this medicine. Avoid taking products that contain aspirin, acetaminophen, ibuprofen, naproxen, or ketoprofen unless instructed by your doctor. These medicines may hide a fever. Do not become pregnant while taking this medicine. Women should inform their doctor if they wish to become pregnant or think they might be pregnant. There is a potential for serious side effects to an unborn child. Talk to your health care professional or pharmacist for more information. Do not breast-feed an infant while taking this  medicine. Men should inform their doctor if they wish to father a child. This medicine may lower sperm counts. Do not treat diarrhea with over the counter products. Contact your doctor if you have diarrhea that lasts more than 2 days or if it is severe and watery. This medicine can make you more sensitive to the sun. Keep out of the sun. If you cannot avoid being in the sun, wear protective clothing and use sunscreen. Do not use  sun lamps or tanning beds/booths. What side effects may I notice from receiving this medicine? Side effects that you should report to your doctor or health care professional as soon as possible:  allergic reactions like skin rash, itching or hives, swelling of the face, lips, or tongue  low blood counts - this medicine may decrease the number of white blood cells, red blood cells and platelets. You may be at increased risk for infections and bleeding.  signs of infection - fever or chills, cough, sore throat, pain or difficulty passing urine  signs of decreased platelets or bleeding - bruising, pinpoint red spots on the skin, black, tarry stools, blood in the urine  signs of decreased red blood cells - unusually weak or tired, fainting spells, lightheadedness  breathing problems  changes in vision  chest pain  mouth sores  nausea and vomiting  pain, swelling, redness at site where injected  pain, tingling, numbness in the hands or feet  redness, swelling, or sores on hands or feet  stomach pain  unusual bleeding Side effects that usually do not require medical attention (report to your doctor or health care professional if they continue or are bothersome):  changes in finger or toe nails  diarrhea  dry or itchy skin  hair loss  headache  loss of appetite  sensitivity of eyes to the light  stomach upset  unusually teary eyes This list may not describe all possible side effects. Call your doctor for medical advice about side effects. You  may report side effects to FDA at 1-800-FDA-1088. Where should I keep my medicine? This drug is given in a hospital or clinic and will not be stored at home. NOTE: This sheet is a summary. It may not cover all possible information. If you have questions about this medicine, talk to your doctor, pharmacist, or health care provider.  2021 Elsevier/Gold Standard (2019-02-03 15:00:03)

## 2020-07-18 NOTE — Telephone Encounter (Signed)
Refill Request.  

## 2020-07-18 NOTE — Progress Notes (Signed)
Infuse Carboplatin over 60 minutes.  Raul Del [place], [place], BCPS, BCOP 07/18/2020 1:35 PM

## 2020-07-19 ENCOUNTER — Other Ambulatory Visit: Payer: Self-pay | Admitting: Hematology and Oncology

## 2020-07-19 ENCOUNTER — Telehealth: Payer: Self-pay | Admitting: *Deleted

## 2020-07-19 LAB — T4: T4, Total: 7 ug/dL (ref 4.5–12.0)

## 2020-07-19 MED ORDER — MIRTAZAPINE 15 MG PO TABS: 30.0000 mg | ORAL_TABLET | Freq: Every day | ORAL | 0 refills | Status: DC

## 2020-07-19 NOTE — Telephone Encounter (Signed)
Called & spoke with pt to see how he did with his treatment yesterday.  He has his Fluorouracil going via pump & reports it is doing well.  He denies any problems & states he knows how to reach [place] if needed.

## 2020-07-19 NOTE — Telephone Encounter (Signed)
-----   Message from Wylene Men, RN sent at 07/18/2020  5:20 PM EDT ----- Regarding: [facility] KEYTRUDA/CARBOPLATIN/5FU Patient received 1st time Keytruda/Carbo/5FU.  Tolerated well.  No s/s or c/o distress or discomfort.

## 2020-07-19 NOTE — Progress Notes (Signed)
I spoke to Dr Rolene Course from [facility] who approved the request for chemo immunotherapy, this should be effective from March 29 th. [id number]

## 2020-07-21 NOTE — Progress Notes (Signed)
  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   Plan: The patient will follow-up with radiation oncology in 16mo; he is about to go to [facility] for consultations re: systemic options.  ________________________________________________   [name], MD

## 2020-07-22 ENCOUNTER — Other Ambulatory Visit: Payer: Self-pay

## 2020-07-22 ENCOUNTER — Inpatient Hospital Stay: Payer: Managed Care, Other (non HMO)

## 2020-07-22 ENCOUNTER — Other Ambulatory Visit: Payer: Self-pay | Admitting: Hematology and Oncology

## 2020-07-22 ENCOUNTER — Encounter: Payer: Self-pay | Admitting: Medical

## 2020-07-22 VITALS — BP 113/67 | HR 91 | Temp 98.5°F | Resp 16

## 2020-07-22 DIAGNOSIS — C01 Malignant neoplasm of base of tongue: Secondary | ICD-10-CM

## 2020-07-22 DIAGNOSIS — C090 Malignant neoplasm of tonsillar fossa: Secondary | ICD-10-CM

## 2020-07-22 MED ORDER — SODIUM CHLORIDE 0.9% FLUSH
10.0000 mL | INTRAVENOUS | Status: DC | PRN
  Administered 2020-07-22: 10 mL
  Filled 2020-07-22: qty 10

## 2020-07-22 MED ORDER — MEGESTROL ACETATE 625 MG/5ML PO SUSP: 625.0000 mg | Freq: Every day | ORAL | 0 refills | Status: DC

## 2020-07-22 MED ORDER — HEPARIN SOD (PORK) LOCK FLUSH 100 UNIT/ML IV SOLN
500.0000 [IU] | Freq: Once | INTRAVENOUS | Status: AC | PRN
Start: 2020-07-22 — End: 2020-07-22
  Administered 2020-07-22: 500 [IU]
  Filled 2020-07-22: qty 5

## 2020-07-22 NOTE — Progress Notes (Unsigned)
Patient presented for pump d/c. No apparent distress noted. Patient states he has mouth tenderness but has been able to tolerate fluids. States his ability to eat has been minimally altered. States he doesn't feel dehydrated. Vitals stable. Encouraged patient to take medications as directed and to call office if he develops additional symptoms or if he is unable to eat or drink. Verbalizes understanding.

## 2020-07-22 NOTE — Progress Notes (Signed)
Magic mouthwash and megace prescribed. Megace can increase risk of blood clots.  Kenise Barraco

## 2020-07-22 NOTE — Progress Notes (Signed)
Per Dr. Rob Hickman orders, called in Magic Mouth wash for patient. Magic Mouthwash (470 ml)  consisted of equal parts viscous lidocaine, benadryl, and malox. Patient instructed to take 15 ml three times a day as needed. Patient is to swish and swallow. Noted that patient should not eat or drink for 1 hour following use of magic mouthwash to prevent aspiration. Patient received 3 refills. Patient aware of new medication.

## 2020-07-25 ENCOUNTER — Telehealth: Payer: Self-pay | Admitting: Medical

## 2020-07-25 ENCOUNTER — Other Ambulatory Visit: Payer: Self-pay

## 2020-07-25 NOTE — Telephone Encounter (Signed)
Cancelled appointment per 05/09 schedule message. Patient is aware.

## 2020-07-26 ENCOUNTER — Inpatient Hospital Stay: Payer: Managed Care, Other (non HMO)

## 2020-07-26 ENCOUNTER — Encounter: Payer: Self-pay | Admitting: Hematology and Oncology

## 2020-07-26 ENCOUNTER — Other Ambulatory Visit (HOSPITAL_COMMUNITY): Payer: Self-pay

## 2020-07-26 ENCOUNTER — Inpatient Hospital Stay (HOSPITAL_BASED_OUTPATIENT_CLINIC_OR_DEPARTMENT_OTHER): Payer: Managed Care, Other (non HMO) | Admitting: Hematology and Oncology

## 2020-07-26 ENCOUNTER — Other Ambulatory Visit: Payer: Self-pay | Admitting: Hematology and Oncology

## 2020-07-26 ENCOUNTER — Other Ambulatory Visit: Payer: Self-pay

## 2020-07-26 DIAGNOSIS — C090 Malignant neoplasm of tonsillar fossa: Secondary | ICD-10-CM

## 2020-07-26 DIAGNOSIS — C01 Malignant neoplasm of base of tongue: Secondary | ICD-10-CM

## 2020-07-26 DIAGNOSIS — C7951 Secondary malignant neoplasm of bone: Secondary | ICD-10-CM | POA: Diagnosis not present

## 2020-07-26 DIAGNOSIS — R066 Hiccough: Secondary | ICD-10-CM | POA: Diagnosis not present

## 2020-07-26 DIAGNOSIS — K1231 Oral mucositis (ulcerative) due to antineoplastic therapy: Secondary | ICD-10-CM

## 2020-07-26 DIAGNOSIS — R634 Abnormal weight loss: Secondary | ICD-10-CM

## 2020-07-26 LAB — CMP (CANCER CENTER ONLY)
ALT: 25 U/L (ref 0–44)
AST: 22 U/L (ref 15–41)
Albumin: 3.7 g/dL (ref 3.5–5.0)
Alkaline Phosphatase: 178 U/L — ABNORMAL HIGH (ref 38–126)
Anion gap: 8 (ref 5–15)
BUN: 21 mg/dL — ABNORMAL HIGH (ref 6–20)
CO2: 28 mmol/L (ref 22–32)
Calcium: 9.4 mg/dL (ref 8.9–10.3)
Chloride: 96 mmol/L — ABNORMAL LOW (ref 98–111)
Creatinine: 0.8 mg/dL (ref 0.61–1.24)
GFR, Estimated: >60 mL/min (ref >60)
Glucose, Bld: 199 mg/dL — ABNORMAL HIGH (ref 70–99)
Potassium: 4.2 mmol/L (ref 3.5–5.1)
Sodium: 132 mmol/L — ABNORMAL LOW (ref 135–145)
Total Bilirubin: 0.4 mg/dL (ref 0.3–1.2)
Total Protein: 7.8 g/dL (ref 6.5–8.1)

## 2020-07-26 LAB — CBC WITH DIFFERENTIAL (CANCER CENTER ONLY)
Abs Immature Granulocytes: 0.02 10*3/uL (ref 0.00–0.07)
Basophils Absolute: 0 10*3/uL (ref 0.0–0.1)
Basophils Relative: 1 %
Eosinophils Absolute: 0.1 10*3/uL (ref 0.0–0.5)
Eosinophils Relative: 4 %
HCT: 30.3 % — ABNORMAL LOW (ref 39.0–52.0)
Hemoglobin: 10.5 g/dL — ABNORMAL LOW (ref 13.0–17.0)
Immature Granulocytes: 1 %
Lymphocytes Relative: 7 %
Lymphs Abs: 0.2 10*3/uL — ABNORMAL LOW (ref 0.7–4.0)
MCH: 31.6 pg (ref 26.0–34.0)
MCHC: 34.7 g/dL (ref 30.0–36.0)
MCV: 91.3 fL (ref 80.0–100.0)
Monocytes Absolute: 0.3 10*3/uL (ref 0.1–1.0)
Monocytes Relative: 9 %
Neutro Abs: 2.5 10*3/uL (ref 1.7–7.7)
Neutrophils Relative %: 78 %
Platelet Count: 142 10*3/uL — ABNORMAL LOW (ref 150–400)
RBC: 3.32 MIL/uL — ABNORMAL LOW (ref 4.22–5.81)
RDW: 14.9 % (ref 11.5–15.5)
WBC Count: 3.2 10*3/uL — ABNORMAL LOW (ref 4.0–10.5)
nRBC: 0 % (ref 0.0–0.2)

## 2020-07-26 MED ORDER — HYDROCODONE-ACETAMINOPHEN 7.5-325 MG/15ML PO SOLN: 10.0000 mL | Freq: Four times a day (QID) | ORAL | 0 refills | Status: DC | PRN

## 2020-07-26 MED ORDER — CHLORPROMAZINE HCL 10 MG PO TABS: 10.0000 mg | ORAL_TABLET | Freq: Three times a day (TID) | ORAL | 0 refills | Status: DC | PRN

## 2020-07-26 MED ORDER — HYDROCODONE-ACETAMINOPHEN 7.5-325 MG/15ML PO SOLN
10.0000 mL | Freq: Four times a day (QID) | ORAL | 0 refills | Status: DC | PRN
  Filled 2020-07-26: qty 280, 7d supply, fill #0

## 2020-07-26 NOTE — Progress Notes (Signed)
[place] NOTE  Patient Care Team: [name], MD as PCP - General (Family Medicine) [name], MD as Consulting Physician (Otolaryngology) [name], MD as Attending Physician (Radiation Oncology) Leota Sauers, RN (Inactive) as Registered Nurse (Oncology) Wynelle Beckmann, Melodie Bouillon, PT as Physical Therapist (Physical Therapy) Sharen Counter, CCC-SLP as Speech Language Pathologist (Speech Pathology) Karie Mainland, RD as Dietitian (Nutrition) Malmfelt, Stephani Police, RN as Oncology Nurse Navigator (Oncology)  CHIEF COMPLAINTS/PURPOSE OF CONSULTATION:   Interim visit for severe mucositis, hiccups and ongoing weight loss  ASSESSMENT & PLAN:   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 He started with Beryle Flock for systemic therapy given cytopenias, but has now recovered, hence we have added Carbo/5 FU to [facility] for C2. He is now here for an interim visit for severe mucositis involving the oral cavity, intractable hiccups, poor oral intolerance and weight loss. Will omit or dose reduce 5 FU for his next visit based on improvement from his current mucositis.  Mucositis due to chemotherapy Severe mucositis mostly involving oral cavity, since he describes no odynophagia. Continue viscous lidocaine PRN Start Hycet every 6-8 hrs PRN for pain management. Continue hydration and Boost as tolerated. Will dose reduce 5 FU for next infusion or omit based on his response in the next week or 10 days. No concern for DPD deficiency because there are no other symptoms. DC oxycodone while on hycet  Intractable hiccoughs Will try thorazine for hiccups. Likely also aggravated by dehydration Will arrange for IVF in infusion this week or early next week In the mean time, I have encouraged him to drink at least 1500 ml  water daily. DC compazine while on thorazine.  Bone metastases ([place]) Bone pain previously well controlled on gabapentin and oxycodone. However since we started him on hycet, we will DC oxycodone. He should continue and try using hydrocodone and gabapentin for now.  Weight loss, unintentional Weight loss, lost about 5-10 lbs. Likely from poor intake related to severe mucositis. Will refer to nutrition Encouraged boost to complement calorie intake. If no imrpovement in mucositis in next 10 days, will consider G tube.  Orders Placed This Encounter  Procedures  . CBC with Differential/Platelet    Standing Status:   Standing    Number of Occurrences:   22    Standing Expiration Date:   07/26/2021  . CMP ([place] only)    Standing Status:   Future    Standing Expiration Date:   07/26/2021    HISTORY OF PRESENTING ILLNESS:   Carnel Stegman 50 y.o. male is here because of metastatic HPV positive BOT cancer  Oncology History Overview Note   In summary, patient presents with Dr. Constance Holster of ENT in late 03/2018 for evaluation of an enlarging right neck mass.  CT neck showed necrotic right Level II and III necrotic LN's, possibly from oropharyngeal oral cavity primary, but the study was limited due to streak artifacts.  He underwent FNA of the R Level II LN, which showed squamous cell carcinoma, p16+.  PET in 05/2018 showed asymmetric FDG uptake in the right tonsil/base of the tongue, likely the primary malignancy.  In addition, there were two FDG-avid R Level II LN's without evidence of contralateral cervical LN or metastatic disease.  Given the relatively low volume disease, Dr Maylon Peppers recommended upfront surgery, followed by adjuvant  therapy based on the final pathology. He had TORS on 06/16/2018.  Pathology from the procedure showed: invasive p16-positive (HPV-related) squamous cell carcinoma, 1.4 cm, negative margins. Out of 24 biopsied lymph nodes, only one was positive for metastatic  carcinoma (1/24).  This node was 4.5cm but with no extracapsular extension.  No LVSI and no PNI.  He underwent right neck dissection but not left neck dissection.  At postop follow up, they agreed to proceed with observation and reserve radiation therapy for any possible future recurrence.   At follow up on 04/07/2019, Dr. [name] noted a new suspicious left upper neck lymph node. Neck CT performed that day revealed: new heterogeneous 2.2 cm contralateral left level 2a lymph node suspicious for contralateral nodal metastasis; indeterminate mild interval increase in size of several left level 2a and 2b lymph nodes.   PET scan performed on 04/16/2019 showed: single hypermetabolic left cervical lymph node; mild asymmetric FDG uptake in left glossotonsillar sulcus; focal FDG uptake in L1 vertebral body without CT correlate; otherwise, no malignant-range FDG uptake elsewhere.  He underwent left tonsillectomy and left neck dissection on 05/04/2019 with pathology revealing: benign left tonsil. Out of a total of 25 biopsied lymph nodes, only one was positive for metastatic carcinoma but negative for extracapsular extension and positive for p16.  He was advised adjuvant radiation at this time. He completed radiation on 07/24/2019. He was seen for FU by Dr Isidore Moos in December when he was doing well. He then started complaining of back pain in February. This led to MRI and PET imaging.  04/26/2020 MRI showed extensive metastatic disease throughout the lumbar spine as well as the sacrum and left iliac bone. No pathologic fracture or epidural Tumor.  05/04/2020 PET CT showed widespread hypermetabolic bone mets. 3.5 cm necrotic hypermetabolic met lesion in right liver. Hypermetabolic uptake in right hilum associated with 2 hypermetabolic lung nodules. Patchy/nodular ground-glass opacity in the right lung having a tree-in-bud configuration. This would be an atypical appearance for metastatic disease and infectious  etiology is favored, potentially Atypical.  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 Right level 2 necrotic nodal mass and level 3 necrotic lymph node are highly concerning for nodal metastasis from head and neck primary neoplasm likely oropharynx or the oral cavity. However, evaluation for primary neoplasm in these regions is markedly limited due to streak artifacts from dental amalgam and closed airway possibly secondary to patient's holding breath/swallowing. Recommend direct visual inspection and possibly PET CT scan for further evaluation.  2. Mild asymmetry and fullness of the right nasopharyngeal region, nonspecific.  3. 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   05/07/2018 Procedure   FNA of the R IJ Level II LN    05/07/2018 Pathology Results   ACCESSION NUMBER: [id number]  Right IJ chain lymph node, FNA Squamous cell carcinoma, metastatic; p16+    05/28/2018 Imaging   PET: IMPRESSION: 1. Asymmetric hypermetabolic activity in the RIGHT lingular tonsil/base of tongue region favored primary carcinoma. 2. Two hypermetabolic RIGHT level II metastatic lymph nodes. 3. No LEFT cervical lymphadenopathy.  No distant metastatic disease.   Malignant neoplasm of base of tongue ([place])  04/24/2018 Imaging   CT neck w/ contrast: 1. Right level 2 necrotic nodal mass and level 3 necrotic lymph node are highly concerning for  nodal metastasis from head and neck primary neoplasm likely oropharynx or the oral cavity. However, evaluation for primary neoplasm in these regions is markedly limited due to streak artifacts from dental amalgam and closed airway possibly secondary to patient's holding breath/swallowing. Recommend direct visual inspection and possibly PET CT scan for further evaluation.  2. Mild  asymmetry and fullness of the right nasopharyngeal region, nonspecific.  3. 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   05/07/2018 Procedure   FNA of the right cervical LN   05/07/2018 Pathology Results   ACCESSION NUMBER: [id number]  Right IJ chain lymph node, FNA Squamous cell carcinoma, metastatic; p16+    05/28/2018 Imaging   PET: IMPRESSION: 1. Asymmetric hypermetabolic activity in the RIGHT lingular tonsil/base of tongue region favored primary carcinoma. 2. Two hypermetabolic RIGHT level II metastatic lymph nodes. 3. No LEFT cervical lymphadenopathy.  No distant metastatic disease.   06/02/2018 Initial Diagnosis   Malignant neoplasm of base of tongue ([place])   06/02/2018 Cancer Staging   Staging form: Pharynx - HPV-Mediated Oropharynx, AJCC 8th Edition - Clinical: Stage I (cT2, cN1, cM0, p16+) - Signed by [name], MD on 06/02/2018   05/29/2019 Cancer Staging   Staging form: Pharynx - HPV-Mediated Oropharynx, AJCC 8th Edition - Pathologic stage from 05/29/2019: Stage I (rpT0, pN1, cM0, p16+) - Signed by [name], MD on 05/29/2019   06/27/2020 - 06/27/2020 Chemotherapy         07/12/2020 Cancer Staging   Staging form: Pharynx - HPV-Mediated Oropharynx, AJCC 8th Edition - Pathologic stage from 07/12/2020: No Stage Recommended (rcT0, cN0, cM1, p16+) - Signed by Benay Pike, MD on 07/12/2020 Stage prefix: Recurrence   07/18/2020 -  Chemotherapy    Patient is on Treatment Plan: HEAD/NECK PEMBROLIZUMAB + CARBOPLATIN + 5FU Q21D X 6 CYCLES / PEMBROLIZUMAB Q21D      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 He came with his wife today. He says mouth pain is so intense, he has to brush his teeth with his finger, he cant eat anything. He is only able to get some boost in for the past week. He has been taking oxycodone for back pain, this doesn't touch the pain, viscous lidocaine doesn't help enough. He has also had severe intractable hiccoughs for the past week, very bothersome. He lost about 5-10 lbs of weight since last visit Besides these symptoms, he denies any fevers, shortness of breath, diarrhea, dysuria. Wife is understandably very concerned about what he is going through. He felt very tired today, had to take a day off. Back pain reasonably controlled with gabapentin and oxycodone.  Rest of the pertinent 10 point ROS reviewed and negative.  MEDICAL HISTORY:  Past Medical History:  Diagnosis Date  . Diabetes mellitus without complication ([place])     SURGICAL HISTORY: Past Surgical History:  Procedure Laterality Date  . IR IMAGING GUIDED PORT INSERTION  07/15/2020  . left neck dissection Left 05/04/2019   Left Neck Dissection by Dr. [name] at [facility].   . neck sugery     as  a child "had a knot removed from neck" not sure which side.   . RIght neck dissection Right 06/16/2018   TORS and right neck dissection. Dr. [name] at [place]: Social History   Socioeconomic History  . Marital status: Married    Spouse name: Not on file  . Number of children: 4  . Years of education: Not on file  . Highest education level: Not on file  Occupational History  . Not on file  Tobacco Use  . Smoking status: Never Smoker  . Smokeless tobacco: Never Used  Vaping Use  . Vaping Use: Never used  Substance and Sexual Activity  . Alcohol use: Yes    Comment: occasional  . Drug use: Never  . Sexual activity: Not on file  Other Topics Concern  . Not on file  Social History Narrative  . Not on file   Social Determinants of Health   Financial  Resource Strain: Not on file  Food Insecurity: Not on file  Transportation Needs: Not on file  Physical Activity: Not on file  Stress: Not on file  Social Connections: Not on file  Intimate Partner Violence: Not on file    FAMILY HISTORY: No family history on file.  ALLERGIES:  is allergic to penicillins.  MEDICATIONS:  Current Outpatient Medications  Medication Sig Dispense Refill  . HYDROcodone-acetaminophen (HYCET) 7.5-325 mg/15 ml solution Take 10 mLs by mouth 4 (four) times daily as needed for moderate pain. 473 mL 0  . chlorproMAZINE (THORAZINE) 10 MG tablet Take 1 tablet (10 mg total) by mouth 3 (three) times daily as needed for hiccoughs. 20 tablet 0  . dexamethasone (DECADRON) 4 MG tablet Take 2 tablets (8 mg total) by mouth daily. Start the day after carboplatin chemotherapy for 3 days. 30 tablet 1  . diphenhydrAMINE (BENADRYL) 25 MG tablet Take 25 mg by mouth at bedtime.    . gabapentin (NEURONTIN) 300 MG capsule Take 900 mg by mouth 3 (three) times daily.    Marland Kitchen lidocaine (XYLOCAINE) 2 % solution Use as directed in the mouth or throat.    . lidocaine-prilocaine (EMLA) cream Apply to affected area once 30 g 3  . megestrol (MEGACE ES) 625 MG/5ML suspension Take 5 mLs (625 mg total) by mouth daily. 150 mL 0  . methocarbamol (ROBAXIN) 750 MG tablet Take 1 tablet (750 mg total) by mouth every 6 (six) hours as needed for muscle spasms. 40 tablet 1  . mirtazapine (REMERON) 15 MG tablet Take 2 tablets (30 mg total) by mouth at bedtime. 60 tablet 0  . nystatin (MYCOSTATIN) 100000 UNIT/ML suspension Take 5 mLs (500,000 Units total) by mouth 4 (four) times daily. Swish and spit or swallow until better. (Patient not taking: No sig reported) 240 mL 0  . omeprazole (PRILOSEC) 40 MG capsule TAKE 1 CAPSULE (40 MG TOTAL) BY MOUTH DAILY. 30 capsule 0  . ondansetron (ZOFRAN ODT) 8 MG disintegrating tablet Take 1 tablet (8 mg total) by mouth every 8 (eight) hours as needed for nausea or vomiting.  30 tablet 3  . sodium fluoride (PREVIDENT 5000 PLUS) 1.1 % CREA dental cream Patient to use as directed on a toothbrush or alternatively in his fluoride trays as instructed.  Patient is to spit out excess and not swallow.  Repeat nightly. (Patient not taking: No sig reported) 2 Tube prn  . sucralfate (CARAFATE) 1 g tablet Dissolve 1 tablet in 10 mL H20 and swallow 20 min prior to meals and  bedtime. (Patient not taking: No sig reported) 40 tablet 3   No current facility-administered medications for this visit.   Facility-Administered Medications Ordered in Other Visits  Medication Dose Route Frequency Provider Last Rate Last Admin  . sodium chloride flush (NS) 0.9 % injection 10 mL  10 mL Intracatheter PRN Arica Bevilacqua, MD   10 mL at 07/22/20 1638     PHYSICAL EXAMINATION: ECOG PERFORMANCE STATUS: 2 - Symptomatic, <50% confined to bed  Vitals:   07/26/20 1430  BP: 106/79  Pulse: (!) 114  Resp: 16  Temp: (!) 97.5 F (36.4 C)  SpO2: 100%   Filed Weights   07/26/20 1430  Weight: 134 lb 4.8 oz (60.9 kg)    Physical Exam Constitutional:      Comments: Frail   HENT:     Head: Normocephalic and atraumatic.     Mouth/Throat:     Comments: Severe mucositis affecting corners of mouth and entire oral cavity Cardiovascular:     Rate and Rhythm: Normal rate and regular rhythm.     Pulses: Normal pulses.     Heart sounds: Normal heart sounds.  Pulmonary:     Effort: Pulmonary effort is normal.     Breath sounds: Normal breath sounds.  Abdominal:     General: Abdomen is flat. Bowel sounds are normal.  Musculoskeletal:        General: No swelling or tenderness. Normal range of motion.     Cervical back: No rigidity or tenderness.  Lymphadenopathy:     Cervical: No cervical adenopathy.  Skin:    General: Skin is warm and dry.  Neurological:     General: No focal deficit present.     Mental Status: He is alert.  Psychiatric:        Mood and Affect: Mood normal.      LABORATORY DATA:  I have reviewed the data as listed Lab Results  Component Value Date   WBC 3.2 (L) 07/26/2020   HGB 10.5 (L) 07/26/2020   HCT 30.3 (L) 07/26/2020   MCV 91.3 07/26/2020   PLT 142 (L) 07/26/2020     Chemistry      Component Value Date/Time   NA 132 (L) 07/26/2020 1500   K 4.2 07/26/2020 1500   CL 96 (L) 07/26/2020 1500   CO2 28 07/26/2020 1500   BUN 21 (H) 07/26/2020 1500   CREATININE 0.80 07/26/2020 1500      Component Value Date/Time   CALCIUM 9.4 07/26/2020 1500   ALKPHOS 178 (H) 07/26/2020 1500   AST 22 07/26/2020 1500   ALT 25 07/26/2020 1500   BILITOT 0.4 07/26/2020 1500      RADIOGRAPHIC STUDIES: I have personally reviewed the radiological images as listed and agreed with the findings in the report. IR IMAGING GUIDED PORT INSERTION  Result Date: 07/18/2020 CLINICAL DATA:  Squamous cell carcinoma of the tongue, needs durable venous access for planned treatment regimen EXAM: TUNNELED PORT CATHETER PLACEMENT WITH ULTRASOUND AND FLUOROSCOPIC GUIDANCE FLUOROSCOPY TIME:  6 seconds; less than 1 mGy ANESTHESIA/SEDATION: Intravenous Fentanyl 155mg and Versed 210mwere administered as conscious sedation during continuous monitoring of the patient's level of consciousness and physiological / cardiorespiratory status by the radiology RN, with a total moderate sedation time of 20 minutes. TECHNIQUE: The procedure, risks, benefits, and alternatives were explained to the patient. Questions regarding the procedure were encouraged and answered. The patient understands and consents to the procedure. Patency of the right IJ vein was confirmed with ultrasound with image  documentation. An appropriate skin site was determined. Skin site was marked. Region was prepped using maximum barrier technique including cap and mask, sterile gown, sterile gloves, large sterile sheet, and Chlorhexidine as cutaneous antisepsis. The region was infiltrated locally with 1% lidocaine. Under  real-time ultrasound guidance, the right IJ vein was accessed with a 21 gauge micropuncture needle; the needle tip within the vein was confirmed with ultrasound image documentation. Needle was exchanged over a 018 guidewire for transitional dilator, and vascular measurement was performed. A small incision was made on the right anterior chest wall and a subcutaneous pocket fashioned. A low profile device was selected. The power-injectable port was positioned and its catheter tunneled to the right IJ dermatotomy site. The transitional dilator was exchanged over an Amplatz wire for a peel-away sheath, through which the port catheter, which had been trimmed to the appropriate length, was advanced and positioned under fluoroscopy with its tip at the cavoatrial junction. Spot chest radiograph confirms good catheter position and no pneumothorax. The port was flushed per protocol. The pocket was closed with deep interrupted and subcuticular continuous 3-0 Monocryl sutures. The incisions were covered with Dermabond then covered with a sterile dressing. The patient tolerated the procedure well. COMPLICATIONS: COMPLICATIONS None immediate IMPRESSION: Technically successful right IJ power-injectable port catheter placement. Ready for routine use. Electronically Signed   By: Lucrezia Europe M.D.   On: 07/18/2020 08:37   All questions were answered. The patient knows to call the clinic with any problems, questions or concerns.     Benay Pike, MD 07/27/2020 3:45 PM

## 2020-07-27 ENCOUNTER — Telehealth: Payer: Self-pay | Admitting: Hematology and Oncology

## 2020-07-27 ENCOUNTER — Encounter: Payer: Self-pay | Admitting: Hematology and Oncology

## 2020-07-27 ENCOUNTER — Other Ambulatory Visit: Payer: Self-pay

## 2020-07-27 DIAGNOSIS — K1231 Oral mucositis (ulcerative) due to antineoplastic therapy: Secondary | ICD-10-CM

## 2020-07-27 DIAGNOSIS — R066 Hiccough: Secondary | ICD-10-CM | POA: Insufficient documentation

## 2020-07-27 DIAGNOSIS — R634 Abnormal weight loss: Secondary | ICD-10-CM | POA: Insufficient documentation

## 2020-07-27 LAB — TSH: TSH: 2.578 u[IU]/mL (ref 0.320–4.118)

## 2020-07-27 LAB — T4: T4, Total: 9.8 ug/dL (ref 4.5–12.0)

## 2020-07-27 NOTE — Telephone Encounter (Signed)
Scheduled appts per 5/11 sch msg. Pt aware.  

## 2020-07-27 NOTE — Assessment & Plan Note (Signed)
Will try thorazine for hiccups. Likely also aggravated by dehydration Will arrange for IVF in infusion this week or early next week In the mean time, I have encouraged him to drink at least 1500 ml water daily. DC compazine while on thorazine.

## 2020-07-27 NOTE — Assessment & Plan Note (Signed)
Weight loss, lost about 5-10 lbs. Likely from poor intake related to severe mucositis. Will refer to nutrition Encouraged boost to complement calorie intake. If no imrpovement in mucositis in next 10 days, will consider G tube.

## 2020-07-27 NOTE — Assessment & Plan Note (Addendum)
Severe mucositis mostly involving oral cavity, since he describes no odynophagia. Continue viscous lidocaine PRN Start Hycet every 6-8 hrs PRN for pain management. Continue hydration and Boost as tolerated. Will dose reduce 5 FU for next infusion or omit based on his response in the next week or 10 days. No concern for DPD deficiency because there are no other symptoms. DC oxycodone while on hycet

## 2020-07-27 NOTE — Assessment & Plan Note (Signed)
Metastatic HPV positive SCC, ineligible for trial at [facility] because of cytopenias, likely related to radiation to the spine which has now resolved. He started with Beryle Flock for systemic therapy given cytopenias, but has now recovered, hence we have added Carbo/5 FU to [facility] for C2. He is now here for an interim visit for severe mucositis involving the oral cavity, intractable hiccups, poor oral intolerance and weight loss. Will omit or dose reduce 5 FU for his next visit based on improvement from his current mucositis.

## 2020-07-27 NOTE — Assessment & Plan Note (Signed)
Bone pain previously well controlled on gabapentin and oxycodone. However since we started him on hycet, we will DC oxycodone. He should continue and try using hydrocodone and gabapentin for now.

## 2020-07-28 ENCOUNTER — Inpatient Hospital Stay: Payer: Managed Care, Other (non HMO)

## 2020-07-28 ENCOUNTER — Other Ambulatory Visit: Payer: Self-pay

## 2020-07-28 ENCOUNTER — Telehealth: Payer: Self-pay | Admitting: Hematology and Oncology

## 2020-07-28 VITALS — BP 100/71 | HR 80 | Temp 98.1°F | Resp 18 | Ht 70.0 in | Wt 137.1 lb

## 2020-07-28 DIAGNOSIS — Z95828 Presence of other vascular implants and grafts: Secondary | ICD-10-CM

## 2020-07-28 DIAGNOSIS — K1231 Oral mucositis (ulcerative) due to antineoplastic therapy: Secondary | ICD-10-CM

## 2020-07-28 DIAGNOSIS — C01 Malignant neoplasm of base of tongue: Secondary | ICD-10-CM | POA: Diagnosis not present

## 2020-07-28 MED ORDER — SODIUM CHLORIDE 0.9% FLUSH
10.0000 mL | Freq: Once | INTRAVENOUS | Status: AC
  Administered 2020-07-28: 10 mL via INTRAVENOUS
  Filled 2020-07-28: qty 10

## 2020-07-28 MED ORDER — HEPARIN SOD (PORK) LOCK FLUSH 100 UNIT/ML IV SOLN
500.0000 [IU] | Freq: Once | INTRAVENOUS | Status: AC
  Administered 2020-07-28: 500 [IU] via INTRAVENOUS
  Filled 2020-07-28: qty 5

## 2020-07-28 MED ORDER — SODIUM CHLORIDE 0.9 % IV SOLN
INTRAVENOUS | Status: DC | PRN
  Filled 2020-07-28: qty 250

## 2020-07-28 NOTE — Telephone Encounter (Signed)
Nutrition  Received message from provider with request to see patient regarding weight loss. Nutrition appointment scheduled  5/17 during infusion for IVF. Contacted patient via telephone due to noted severe mucositis as well as voicemail received from wife with questions about alternate supplements. At time of call patient reports he is trying to finish up with work so he can make it to [facility] appointment for IVF. Patient informed of upcoming nutrition appointment as well as invited to call after returning home this afternoon. Patient appreciative.   Lajuan Lines, RD, [place] [phone]

## 2020-07-28 NOTE — Telephone Encounter (Signed)
Scheduled appt per 5/11 sch msg. Nutritionist will see pt while he is getting fluids. Pt is aware of fluids appt.

## 2020-07-29 ENCOUNTER — Encounter: Payer: Self-pay | Admitting: Hematology and Oncology

## 2020-07-29 ENCOUNTER — Other Ambulatory Visit: Payer: Self-pay

## 2020-07-29 DIAGNOSIS — K1231 Oral mucositis (ulcerative) due to antineoplastic therapy: Secondary | ICD-10-CM

## 2020-07-29 MED ORDER — SODIUM CHLORIDE 0.9 % IV SOLN
Freq: Once | INTRAVENOUS | Status: DC
Start: 2020-08-01 — End: 2020-07-29
  Filled 2020-07-29: qty 250

## 2020-07-29 NOTE — Progress Notes (Signed)
IVF orders placed

## 2020-08-01 ENCOUNTER — Other Ambulatory Visit: Payer: Self-pay

## 2020-08-01 ENCOUNTER — Inpatient Hospital Stay: Payer: Managed Care, Other (non HMO)

## 2020-08-01 VITALS — BP 108/68 | HR 72 | Temp 98.3°F | Resp 18 | Wt 143.8 lb

## 2020-08-01 DIAGNOSIS — Z95828 Presence of other vascular implants and grafts: Secondary | ICD-10-CM

## 2020-08-01 DIAGNOSIS — K1231 Oral mucositis (ulcerative) due to antineoplastic therapy: Secondary | ICD-10-CM

## 2020-08-01 DIAGNOSIS — C01 Malignant neoplasm of base of tongue: Secondary | ICD-10-CM | POA: Diagnosis not present

## 2020-08-01 MED ORDER — HEPARIN SOD (PORK) LOCK FLUSH 100 UNIT/ML IV SOLN
500.0000 [IU] | Freq: Once | INTRAVENOUS | Status: AC
  Administered 2020-08-01: 500 [IU] via INTRAVENOUS
  Filled 2020-08-01: qty 5

## 2020-08-01 MED ORDER — SODIUM CHLORIDE 0.9 % IV SOLN
Freq: Once | INTRAVENOUS | Status: AC
Start: 2020-08-01 — End: 2020-08-01
  Filled 2020-08-01: qty 250

## 2020-08-01 MED ORDER — SODIUM CHLORIDE 0.9% FLUSH
10.0000 mL | Freq: Once | INTRAVENOUS | Status: AC
  Administered 2020-08-01: 10 mL via INTRAVENOUS
  Filled 2020-08-01: qty 10

## 2020-08-01 NOTE — Patient Instructions (Signed)
Rehydration, Adult Rehydration is the replacement of body fluids, salts, and minerals (electrolytes) that are lost during dehydration. Dehydration is when there is not enough water or other fluids in the body. This happens when you lose more fluids than you take in. Common causes of dehydration include:  Not drinking enough fluids. This can occur when you are ill or doing activities that require a lot of energy, especially in hot weather.  Conditions that cause loss of water or other fluids, such as diarrhea, vomiting, sweating, or urinating a lot.  Other illnesses, such as fever or infection.  Certain medicines, such as those that remove excess fluid from the body (diuretics). Symptoms of mild or moderate dehydration may include thirst, dry lips and mouth, and dizziness. Symptoms of severe dehydration may include increased heart rate, confusion, fainting, and not urinating. For severe dehydration, you may need to get fluids through an IV at the hospital. For mild or moderate dehydration, you can usually rehydrate at home by drinking certain fluids as told by your health care provider. What are the risks? Generally, rehydration is safe. However, taking in too much fluid (overhydration) can be a problem. This is rare. Overhydration can cause an electrolyte imbalance, kidney failure, or a decrease in salt (sodium) levels in the body. Supplies needed You will need an oral rehydration solution (ORS) if your health care provider tells you to use one. This is a drink to treat dehydration. It can be found in pharmacies and retail stores. How to rehydrate Fluids Follow instructions from your health care provider for rehydration. The kind of fluid and the amount you should drink depend on your condition. In general, you should choose drinks that you prefer.  If told by your health care provider, drink an ORS. ? Make an ORS by following instructions on the package. ? Start by drinking small amounts,  about  cup (120 mL) every 5-10 minutes. ? Slowly increase how much you drink until you have taken the amount recommended by your health care provider.  Drink enough clear fluids to keep your urine pale yellow. If you were told to drink an ORS, finish it first, then start slowly drinking other clear fluids. Drink fluids such as: ? Water. This includes sparkling water and flavored water. Drinking only water can lead to having too little sodium in your body (hyponatremia). Follow the advice of your health care provider. ? Water from ice chips you suck on. ? Fruit juice with water you add to it (diluted). ? Sports drinks. ? Hot or cold herbal teas. ? Broth-based soups. ? Milk or milk products. Food Follow instructions from your health care provider about what to eat while you rehydrate. Your health care provider may recommend that you slowly begin eating regular foods in small amounts.  Eat foods that contain a healthy balance of electrolytes, such as bananas, oranges, potatoes, tomatoes, and spinach.  Avoid foods that are greasy or contain a lot of sugar. In some cases, you may get nutrition through a feeding tube that is passed through your nose and into your stomach (nasogastric tube, or NG tube). This may be done if you have uncontrolled vomiting or diarrhea.   Beverages to avoid Certain beverages may make dehydration worse. While you rehydrate, avoid drinking alcohol.   How to tell if you are recovering from dehydration You may be recovering from dehydration if:  You are urinating more often than before you started rehydrating.  Your urine is pale yellow.  Your energy level   improves.  You vomit less frequently.  You have diarrhea less frequently.  Your appetite improves or returns to normal.  You feel less dizzy or less light-headed.  Your skin tone and color start to look more normal. Follow these instructions at home:  Take over-the-counter and prescription medicines only  as told by your health care provider.  Do not take sodium tablets. Doing this can lead to having too much sodium in your body (hypernatremia). Contact a health care provider if:  You continue to have symptoms of mild or moderate dehydration, such as: ? Thirst. ? Dry lips. ? Slightly dry mouth. ? Dizziness. ? Dark urine or less urine than normal. ? Muscle cramps.  You continue to vomit or have diarrhea. Get help right away if you:  Have symptoms of dehydration that get worse.  Have a fever.  Have a severe headache.  Have been vomiting and the following happens: ? Your vomiting gets worse or does not go away. ? Your vomit includes blood or green matter (bile). ? You cannot eat or drink without vomiting.  Have problems with urination or bowel movements, such as: ? Diarrhea that gets worse or does not go away. ? Blood in your stool (feces). This may cause stool to look black and tarry. ? Not urinating, or urinating only a small amount of very dark urine, within 6-8 hours.  Have trouble breathing.  Have symptoms that get worse with treatment. These symptoms may represent a serious problem that is an emergency. Do not wait to see if the symptoms will go away. Get medical help right away. Call your local emergency services (911 in the [place] Do not drive yourself to the hospital. Summary  Rehydration is the replacement of body fluids and minerals (electrolytes) that are lost during dehydration.  Follow instructions from your health care provider for rehydration. The kind of fluid and amount you should drink depend on your condition.  Slowly increase how much you drink until you have taken the amount recommended by your health care provider.  Contact your health care provider if you continue to show signs of mild or moderate dehydration. This information is not intended to replace advice given to you by your health care provider. Make sure you discuss any questions you have with  your health care provider. Document Revised: 05/06/2019 Document Reviewed: 03/16/2019 Elsevier Patient Education  2021 [employer]  

## 2020-08-02 ENCOUNTER — Inpatient Hospital Stay: Payer: Managed Care, Other (non HMO) | Admitting: Dietician

## 2020-08-02 ENCOUNTER — Inpatient Hospital Stay: Payer: Managed Care, Other (non HMO)

## 2020-08-02 ENCOUNTER — Encounter: Payer: Self-pay | Admitting: Hematology and Oncology

## 2020-08-02 VITALS — BP 96/67 | HR 90 | Temp 98.3°F | Resp 18 | Wt 141.5 lb

## 2020-08-02 DIAGNOSIS — Z95828 Presence of other vascular implants and grafts: Secondary | ICD-10-CM

## 2020-08-02 DIAGNOSIS — K1231 Oral mucositis (ulcerative) due to antineoplastic therapy: Secondary | ICD-10-CM

## 2020-08-02 DIAGNOSIS — C01 Malignant neoplasm of base of tongue: Secondary | ICD-10-CM | POA: Diagnosis not present

## 2020-08-02 MED ORDER — SODIUM CHLORIDE 0.9 % IV SOLN
Freq: Once | INTRAVENOUS | Status: AC
  Filled 2020-08-02: qty 250

## 2020-08-02 MED ORDER — SODIUM CHLORIDE 0.9% FLUSH
10.0000 mL | Freq: Once | INTRAVENOUS | Status: AC
  Administered 2020-08-02: 10 mL via INTRAVENOUS
  Filled 2020-08-02: qty 10

## 2020-08-02 MED ORDER — HEPARIN SOD (PORK) LOCK FLUSH 100 UNIT/ML IV SOLN
500.0000 [IU] | Freq: Once | INTRAVENOUS | Status: AC
Start: 2020-08-02 — End: 2020-08-02
  Administered 2020-08-02: 500 [IU] via INTRAVENOUS
  Filled 2020-08-02: qty 5

## 2020-08-02 NOTE — Progress Notes (Signed)
Met w/ ptto introduce myself as his Arboriculturist and to discuss copay assistance.  Pt would like to applyand gave me consent to apply in his behalf so I completed the [employer] application, got his and Dr. Rob Hickman signature and faxed to [employer] for processing. I will notify the pt of the outcome once I receive it.  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.  I gave him my card for any questions or concerns he may have in the future.

## 2020-08-02 NOTE — Progress Notes (Signed)
Nutrition Follow-up:  Patient with metastatic base of tongue cancer. Patient receiving carboplatin.  Met with patient in infusion, patient receiving IV fluids today. He reports mouth sores are healing, tolerating soft foods and drinking 3-4 Boost Original (240 kcal, 10 gram protein) Patient reports eating 3 bowls of home made sweet/sour soup yesterday. He reports this burned his mouth some but relieved by sips of water every 3-4 bites. Patient reports using Lidocaine mouth rinse 3 times daily.    Medications: Hydrocodone, Decadron, Gabapentin, Megace, Remeron, Nystatin, Carafate  Labs: Na 132, Glucose 199, BUN 21  Anthropometrics: Weight 141 lb 8 oz today increased 4 lbs from 137 lb 1.6 oz on 5/12 (suspect weight increase related to fluid)  5/10 - 134 lb 4.8 oz 4/29 - 150 lb 8 oz 4/11 - 148 lb 4 oz   NUTRITION DIAGNOSIS: Inadequate oral intake ongoing  INTERVENTION:  Educated on soft moist high protein foods, handout provided Discussed foods best tolerated with sore mouth, handout provided Discussed strategies for sore/dry mouth (using cool mist humidifier, soft toothbrush, using baking soda/salt water rinses several times/day), handout provided Discussed types of supplements, recommended switching to more nutrient dense supplement Complimentary Ensure Plus case provided today   Recipes for high calorie soft foods Recipes for shakes     MONITORING, EVALUATION, GOAL: weight trends, intake   NEXT VISIT: June 14 in infusion

## 2020-08-02 NOTE — Patient Instructions (Signed)
Rehydration, Adult Rehydration is the replacement of body fluids, salts, and minerals (electrolytes) that are lost during dehydration. Dehydration is when there is not enough water or other fluids in the body. This happens when you lose more fluids than you take in. Common causes of dehydration include:  Not drinking enough fluids. This can occur when you are ill or doing activities that require a lot of energy, especially in hot weather.  Conditions that cause loss of water or other fluids, such as diarrhea, vomiting, sweating, or urinating a lot.  Other illnesses, such as fever or infection.  Certain medicines, such as those that remove excess fluid from the body (diuretics). Symptoms of mild or moderate dehydration may include thirst, dry lips and mouth, and dizziness. Symptoms of severe dehydration may include increased heart rate, confusion, fainting, and not urinating. For severe dehydration, you may need to get fluids through an IV at the hospital. For mild or moderate dehydration, you can usually rehydrate at home by drinking certain fluids as told by your health care provider. What are the risks? Generally, rehydration is safe. However, taking in too much fluid (overhydration) can be a problem. This is rare. Overhydration can cause an electrolyte imbalance, kidney failure, or a decrease in salt (sodium) levels in the body. Supplies needed You will need an oral rehydration solution (ORS) if your health care provider tells you to use one. This is a drink to treat dehydration. It can be found in pharmacies and retail stores. How to rehydrate Fluids Follow instructions from your health care provider for rehydration. The kind of fluid and the amount you should drink depend on your condition. In general, you should choose drinks that you prefer.  If told by your health care provider, drink an ORS. ? Make an ORS by following instructions on the package. ? Start by drinking small amounts,  about  cup (120 mL) every 5-10 minutes. ? Slowly increase how much you drink until you have taken the amount recommended by your health care provider.  Drink enough clear fluids to keep your urine pale yellow. If you were told to drink an ORS, finish it first, then start slowly drinking other clear fluids. Drink fluids such as: ? Water. This includes sparkling water and flavored water. Drinking only water can lead to having too little sodium in your body (hyponatremia). Follow the advice of your health care provider. ? Water from ice chips you suck on. ? Fruit juice with water you add to it (diluted). ? Sports drinks. ? Hot or cold herbal teas. ? Broth-based soups. ? Milk or milk products. Food Follow instructions from your health care provider about what to eat while you rehydrate. Your health care provider may recommend that you slowly begin eating regular foods in small amounts.  Eat foods that contain a healthy balance of electrolytes, such as bananas, oranges, potatoes, tomatoes, and spinach.  Avoid foods that are greasy or contain a lot of sugar. In some cases, you may get nutrition through a feeding tube that is passed through your nose and into your stomach (nasogastric tube, or NG tube). This may be done if you have uncontrolled vomiting or diarrhea.   Beverages to avoid Certain beverages may make dehydration worse. While you rehydrate, avoid drinking alcohol.   How to tell if you are recovering from dehydration You may be recovering from dehydration if:  You are urinating more often than before you started rehydrating.  Your urine is pale yellow.  Your energy level   improves.  You vomit less frequently.  You have diarrhea less frequently.  Your appetite improves or returns to normal.  You feel less dizzy or less light-headed.  Your skin tone and color start to look more normal. Follow these instructions at home:  Take over-the-counter and prescription medicines only  as told by your health care provider.  Do not take sodium tablets. Doing this can lead to having too much sodium in your body (hypernatremia). Contact a health care provider if:  You continue to have symptoms of mild or moderate dehydration, such as: ? Thirst. ? Dry lips. ? Slightly dry mouth. ? Dizziness. ? Dark urine or less urine than normal. ? Muscle cramps.  You continue to vomit or have diarrhea. Get help right away if you:  Have symptoms of dehydration that get worse.  Have a fever.  Have a severe headache.  Have been vomiting and the following happens: ? Your vomiting gets worse or does not go away. ? Your vomit includes blood or green matter (bile). ? You cannot eat or drink without vomiting.  Have problems with urination or bowel movements, such as: ? Diarrhea that gets worse or does not go away. ? Blood in your stool (feces). This may cause stool to look black and tarry. ? Not urinating, or urinating only a small amount of very dark urine, within 6-8 hours.  Have trouble breathing.  Have symptoms that get worse with treatment. These symptoms may represent a serious problem that is an emergency. Do not wait to see if the symptoms will go away. Get medical help right away. Call your local emergency services (911 in the [place] Do not drive yourself to the hospital. Summary  Rehydration is the replacement of body fluids and minerals (electrolytes) that are lost during dehydration.  Follow instructions from your health care provider for rehydration. The kind of fluid and amount you should drink depend on your condition.  Slowly increase how much you drink until you have taken the amount recommended by your health care provider.  Contact your health care provider if you continue to show signs of mild or moderate dehydration. This information is not intended to replace advice given to you by your health care provider. Make sure you discuss any questions you have with  your health care provider. Document Revised: 05/06/2019 Document Reviewed: 03/16/2019 Elsevier Patient Education  2021 [employer]  

## 2020-08-04 ENCOUNTER — Other Ambulatory Visit: Payer: Self-pay | Admitting: Hematology and Oncology

## 2020-08-08 ENCOUNTER — Encounter: Payer: Managed Care, Other (non HMO) | Admitting: Medical

## 2020-08-09 ENCOUNTER — Encounter: Payer: Self-pay | Admitting: Hematology and Oncology

## 2020-08-09 ENCOUNTER — Other Ambulatory Visit: Payer: Self-pay

## 2020-08-09 ENCOUNTER — Inpatient Hospital Stay (HOSPITAL_BASED_OUTPATIENT_CLINIC_OR_DEPARTMENT_OTHER): Payer: Managed Care, Other (non HMO) | Admitting: Hematology and Oncology

## 2020-08-09 ENCOUNTER — Inpatient Hospital Stay: Payer: Managed Care, Other (non HMO)

## 2020-08-09 DIAGNOSIS — R634 Abnormal weight loss: Secondary | ICD-10-CM

## 2020-08-09 DIAGNOSIS — C01 Malignant neoplasm of base of tongue: Secondary | ICD-10-CM | POA: Diagnosis not present

## 2020-08-09 DIAGNOSIS — G893 Neoplasm related pain (acute) (chronic): Secondary | ICD-10-CM | POA: Diagnosis not present

## 2020-08-09 DIAGNOSIS — C090 Malignant neoplasm of tonsillar fossa: Secondary | ICD-10-CM

## 2020-08-09 DIAGNOSIS — K1231 Oral mucositis (ulcerative) due to antineoplastic therapy: Secondary | ICD-10-CM

## 2020-08-09 LAB — CMP (CANCER CENTER ONLY)
ALT: 14 U/L (ref 0–44)
AST: 22 U/L (ref 15–41)
Albumin: 3.4 g/dL — ABNORMAL LOW (ref 3.5–5.0)
Alkaline Phosphatase: 185 U/L — ABNORMAL HIGH (ref 38–126)
Anion gap: 5 (ref 5–15)
BUN: 11 mg/dL (ref 6–20)
CO2: 27 mmol/L (ref 22–32)
Calcium: 9 mg/dL (ref 8.9–10.3)
Chloride: 102 mmol/L (ref 98–111)
Creatinine: 0.69 mg/dL (ref 0.61–1.24)
GFR, Estimated: >60 mL/min (ref >60)
Glucose, Bld: 125 mg/dL — ABNORMAL HIGH (ref 70–99)
Potassium: 4.3 mmol/L (ref 3.5–5.1)
Sodium: 134 mmol/L — ABNORMAL LOW (ref 135–145)
Total Bilirubin: 0.2 mg/dL — ABNORMAL LOW (ref 0.3–1.2)
Total Protein: 6.7 g/dL (ref 6.5–8.1)

## 2020-08-09 LAB — CBC WITH DIFFERENTIAL (CANCER CENTER ONLY)
Abs Immature Granulocytes: 0.11 10*3/uL — ABNORMAL HIGH (ref 0.00–0.07)
Basophils Absolute: 0 10*3/uL (ref 0.0–0.1)
Basophils Relative: 1 %
Eosinophils Absolute: 0 10*3/uL (ref 0.0–0.5)
Eosinophils Relative: 1 %
HCT: 26.3 % — ABNORMAL LOW (ref 39.0–52.0)
Hemoglobin: 8.7 g/dL — ABNORMAL LOW (ref 13.0–17.0)
Immature Granulocytes: 6 %
Lymphocytes Relative: 16 %
Lymphs Abs: 0.3 10*3/uL — ABNORMAL LOW (ref 0.7–4.0)
MCH: 31.9 pg (ref 26.0–34.0)
MCHC: 33.1 g/dL (ref 30.0–36.0)
MCV: 96.3 fL (ref 80.0–100.0)
Monocytes Absolute: 0.5 10*3/uL (ref 0.1–1.0)
Monocytes Relative: 23 %
Neutro Abs: 1.1 10*3/uL — ABNORMAL LOW (ref 1.7–7.7)
Neutrophils Relative %: 53 %
Platelet Count: 122 10*3/uL — ABNORMAL LOW (ref 150–400)
RBC: 2.73 MIL/uL — ABNORMAL LOW (ref 4.22–5.81)
RDW: 17 % — ABNORMAL HIGH (ref 11.5–15.5)
WBC Count: 2 10*3/uL — ABNORMAL LOW (ref 4.0–10.5)
nRBC: 0 % (ref 0.0–0.2)

## 2020-08-09 LAB — TSH: TSH: 3.071 u[IU]/mL (ref 0.320–4.118)

## 2020-08-09 MED ORDER — LIDOCAINE VISCOUS HCL 2 % MT SOLN: 15.0000 mL | Freq: Three times a day (TID) | OROMUCOSAL | 0 refills | Status: DC | PRN

## 2020-08-09 NOTE — Assessment & Plan Note (Signed)
Patient is currently on gabapentin and hydrocodone for pain management.  We will continue current pain management since his pain is reasonably well controlled.

## 2020-08-09 NOTE — Assessment & Plan Note (Signed)
His weight has been stable since his last visit. Encouraged to continue oral intake and will continue to monitor it.

## 2020-08-09 NOTE — Assessment & Plan Note (Signed)
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  Patient arrived to the appointment today by himself.  He is doing quite well since his last visit.  Mucositis has completely resolved.  He is able to eat and drink well and he did not lose any weight since his last visit. Physical examination unremarkable today, no concerns. I have reviewed his labs and ANC is less than 1500 hence we will elect to delay treatment by a week and reschedule with repeat labs and follow-up the same day. We will also dose reduce his 5-fluorouracil by 20% given the severe mucositis noted after the first cycle.  His carboplatin has already been dose reduced to AUC of 4 and hence I do not plan to modify it at this time. Return to clinic in 1 week as mentioned above.

## 2020-08-09 NOTE — Assessment & Plan Note (Addendum)
This has resolved. Will dose reduce 5 FU by 20 % Will refill his viscous lidocaine and liquid hydrocodone.

## 2020-08-09 NOTE — Progress Notes (Signed)
[facility] NOTE  Patient Care Team: [name], MD as PCP - General (Family Medicine) [name], MD as Consulting Physician (Otolaryngology) [name], MD as Attending Physician (Radiation Oncology) Leota Sauers, RN (Inactive) as Registered Nurse (Oncology) Wynelle Beckmann, Melodie Bouillon, PT as Physical Therapist (Physical Therapy) Sharen Counter, CCC-SLP as Speech Language Pathologist (Speech Pathology) Karie Mainland, RD as Dietitian (Nutrition) Malmfelt, Stephani Police, RN as Oncology Nurse Navigator (Oncology)  CHIEF COMPLAINTS/PURPOSE OF CONSULTATION:   Follow up before planned second cycle of chemotherapy carbo/5FU/keytruda  ASSESSMENT & PLAN:   Malignant neoplasm of base of tongue [facility]) This is a very pleasant 50 year old male patient with metastatic HPV positive squamous cell carcinoma currently on treatment with 5-fluorouracil, carboplatin and Keytruda who is here before his second planned cycle of chemotherapy.  Patient arrived to the appointment today by himself.  He is doing quite well since his last visit.  Mucositis has completely resolved.  He is able to eat and drink well and he did not lose any weight since his last visit. Physical examination unremarkable today, no concerns. I have reviewed his labs and ANC is less than 1500 hence we will elect to delay treatment by a week and reschedule with repeat labs and follow-up the same day. We will also dose reduce his 5-fluorouracil by 20% given the severe mucositis noted after the first cycle.  His carboplatin has already been dose reduced to AUC of 4 and hence I do not plan to modify it at this time. Return to clinic in 1 week as mentioned above.  Mucositis due to chemotherapy This has resolved. Will dose reduce 5 FU by 20 % Will refill his viscous lidocaine and liquid hydrocodone.  Weight loss, unintentional His weight has been stable since his last visit. Encouraged to continue oral intake  and will continue to monitor it.  Cancer related pain Patient is currently on gabapentin and hydrocodone for pain management.  We will continue current pain management since his pain is reasonably well controlled.  No orders of the defined types were placed in this encounter.   HISTORY OF PRESENTING ILLNESS:   Albert Flowers 50 y.o. male is here because of metastatic HPV positive BOT cancer  Oncology History Overview Note   In summary, patient presents with Dr. Constance Holster of ENT in late 03/2018 for evaluation of an enlarging right neck mass.  CT neck showed necrotic right Level II and III necrotic LN's, possibly from oropharyngeal oral cavity primary, but the study was limited due to streak artifacts.  He underwent FNA of the R Level II LN, which showed squamous cell carcinoma, p16+.  PET in 05/2018 showed asymmetric FDG uptake in the right tonsil/base of the tongue, likely the primary malignancy.  In addition, there were two FDG-avid R Level II LN's without evidence of contralateral cervical LN or metastatic disease.  Given the relatively low volume disease, Dr Maylon Peppers recommended upfront surgery, followed by adjuvant therapy based on the final pathology. He had TORS on 06/16/2018.  Pathology from the procedure showed: invasive p16-positive (HPV-related) squamous cell carcinoma, 1.4 cm, negative margins. Out of 24 biopsied lymph nodes, only one was positive for metastatic carcinoma (1/24).  This node was 4.5cm but with no extracapsular extension.  No LVSI and no PNI.  He underwent right neck dissection but not left neck dissection.  At postop follow up, they agreed to proceed with observation and reserve radiation therapy for any possible future recurrence.   At  follow up on 04/07/2019, Dr. Hezzie Bump noted a new suspicious left upper neck lymph node. Neck CT performed that day revealed: new heterogeneous 2.2 cm contralateral left level 2a lymph node suspicious for contralateral nodal metastasis;  indeterminate mild interval increase in size of several left level 2a and 2b lymph nodes.   PET scan performed on 04/16/2019 showed: single hypermetabolic left cervical lymph node; mild asymmetric FDG uptake in left glossotonsillar sulcus; focal FDG uptake in L1 vertebral body without CT correlate; otherwise, no malignant-range FDG uptake elsewhere.  He underwent left tonsillectomy and left neck dissection on 05/04/2019 with pathology revealing: benign left tonsil. Out of a total of 25 biopsied lymph nodes, only one was positive for metastatic carcinoma but negative for extracapsular extension and positive for p16.  He was advised adjuvant radiation at this time. He completed radiation on 07/24/2019. He was seen for FU by Dr Basilio Cairo in December when he was doing well. He then started complaining of back pain in February. This led to MRI and PET imaging.  04/26/2020 MRI showed extensive metastatic disease throughout the lumbar spine as well as the sacrum and left iliac bone. No pathologic fracture or epidural Tumor.  05/04/2020 PET CT showed widespread hypermetabolic bone mets. 3.5 cm necrotic hypermetabolic met lesion in right liver. Hypermetabolic uptake in right hilum associated with 2 hypermetabolic lung nodules. Patchy/nodular ground-glass opacity in the right lung having a tree-in-bud configuration. This would be an atypical appearance for metastatic disease and infectious etiology is favored, potentially Atypical.  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   Head and neck cancer (HCC)  04/24/2018 Imaging   CT neck w/ contrast: 1. Right level 2 necrotic nodal mass and level 3 necrotic lymph node are highly concerning for nodal metastasis from head and neck primary neoplasm likely oropharynx or the oral cavity. However, evaluation for primary neoplasm in these regions is markedly limited due  to streak artifacts from dental amalgam and closed airway possibly secondary to patient's holding breath/swallowing. Recommend direct visual inspection and possibly PET CT scan for further evaluation.  2. Mild asymmetry and fullness of the right nasopharyngeal region, nonspecific.  3. 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   05/07/2018 Procedure   FNA of the R IJ Level II LN    05/07/2018 Pathology Results   ACCESSION NUMBER: [id number]  Right IJ chain lymph node, FNA Squamous cell carcinoma, metastatic; p16+    05/28/2018 Imaging   PET: IMPRESSION: 1. Asymmetric hypermetabolic activity in the RIGHT lingular tonsil/base of tongue region favored primary carcinoma. 2. Two hypermetabolic RIGHT level II metastatic lymph nodes. 3. No LEFT cervical lymphadenopathy.  No distant metastatic disease.   Malignant neoplasm of base of tongue (HCC)  04/24/2018 Imaging   CT neck w/ contrast: 1. Right level 2 necrotic nodal mass and level 3 necrotic lymph node are highly concerning for nodal metastasis from head and neck primary neoplasm likely oropharynx or the oral cavity. However, evaluation for primary neoplasm in these regions is markedly limited due to streak artifacts from dental amalgam and closed airway possibly secondary to patient's holding breath/swallowing. Recommend direct visual inspection and possibly PET CT scan for further evaluation.  2. Mild asymmetry and fullness of the right nasopharyngeal region, nonspecific.  3. 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   05/07/2018 Procedure   FNA of the right cervical LN  05/07/2018 Pathology Results   ACCESSION NUMBER: [id number]  Right IJ chain lymph node, FNA Squamous cell carcinoma, metastatic; p16+    05/28/2018 Imaging   PET: IMPRESSION: 1. Asymmetric hypermetabolic activity in the RIGHT lingular tonsil/base of tongue region favored primary carcinoma. 2. Two hypermetabolic RIGHT level II metastatic  lymph nodes. 3. No LEFT cervical lymphadenopathy.  No distant metastatic disease.   06/02/2018 Initial Diagnosis   Malignant neoplasm of base of tongue ([place])   06/02/2018 Cancer Staging   Staging form: Pharynx - HPV-Mediated Oropharynx, AJCC 8th Edition - Clinical: Stage I (cT2, cN1, cM0, p16+) - Signed by [name], MD on 06/02/2018   05/29/2019 Cancer Staging   Staging form: Pharynx - HPV-Mediated Oropharynx, AJCC 8th Edition - Pathologic stage from 05/29/2019: Stage I (rpT0, pN1, cM0, p16+) - Signed by [name], MD on 05/29/2019   06/27/2020 - 06/27/2020 Chemotherapy         07/18/2020 -  Chemotherapy    Patient is on Treatment Plan: HEAD/NECK PEMBROLIZUMAB + CARBOPLATIN + 5FU Q21D X 6 CYCLES / PEMBROLIZUMAB Q21D      Malignant neoplasm of tonsillar fossa ([place])  06/27/2020 - 06/27/2020 Chemotherapy         07/18/2020 -  Chemotherapy    Patient is on Treatment Plan: HEAD/NECK PEMBROLIZUMAB + CARBOPLATIN + 5FU Q21D X 6 CYCLES / PEMBROLIZUMAB Q21D       Interval History   Patient is here for follow-up before second cycle of carboplatin/5-FU and Keytruda After his last visit, he had an interim follow-up given severe mucositis.  Since his last visit, his mucositis has completely resolved. He is able to eat and drink better and he did not lose any weight since his last visit.  He has been taking liquid hydrocodone once or twice a day and viscous lidocaine which has been helping him.  No nausea or vomiting or diarrhea.  No fevers or chills.  Back pain varies and is controlled with the liquid hydrocodone as well as the gabapentin.  No other complaints for me today.  He seems to be in good spirits. Rest of the pertinent 10 point ROS reviewed and negative.  MEDICAL HISTORY:  Past Medical History:  Diagnosis Date  . Diabetes mellitus without complication ([place])     SURGICAL HISTORY: Past Surgical History:  Procedure Laterality Date  . IR IMAGING GUIDED PORT INSERTION  07/15/2020   . left neck dissection Left 05/04/2019   Left Neck Dissection by Dr. [name] at [facility].   . neck sugery     as a child "had a knot removed from neck" not sure which side.   . RIght neck dissection Right 06/16/2018   TORS and right neck dissection. Dr. [name] at [facility]: Social History   Socioeconomic History  . Marital status: Married    Spouse name: Not on file  . Number of children: 4  . Years of education: Not on file  . Highest education level: Not on file  Occupational History  . Not on file  Tobacco Use  . Smoking status: Never Smoker  . Smokeless tobacco: Never Used  Vaping Use  . Vaping Use: Never used  Substance and Sexual Activity  . Alcohol use: Yes    Comment: occasional  . Drug use: Never  . Sexual activity: Not on file  Other Topics Concern  . Not on file  Social History Narrative  . Not on file   Social Determinants of Health   Financial Resource  Strain: Not on file  Food Insecurity: Not on file  Transportation Needs: Not on file  Physical Activity: Not on file  Stress: Not on file  Social Connections: Not on file  Intimate Partner Violence: Not on file    FAMILY HISTORY: No family history on file.  ALLERGIES:  is allergic to penicillins.  MEDICATIONS:  Current Outpatient Medications  Medication Sig Dispense Refill  . chlorproMAZINE (THORAZINE) 10 MG tablet Take 1 tablet (10 mg total) by mouth 3 (three) times daily as needed for hiccoughs. 20 tablet 0  . dexamethasone (DECADRON) 4 MG tablet Take 2 tablets (8 mg total) by mouth daily. Start the day after carboplatin chemotherapy for 3 days. 30 tablet 1  . diphenhydrAMINE (BENADRYL) 25 MG tablet Take 25 mg by mouth at bedtime.    . gabapentin (NEURONTIN) 300 MG capsule Take 900 mg by mouth 3 (three) times daily.    Marland Kitchen HYDROcodone-acetaminophen (HYCET) 7.5-325 mg/15 ml solution Take 10 mLs by mouth 4 (four) times daily as needed for moderate pain. 473 mL 0  . lidocaine-prilocaine  (EMLA) cream Apply to affected area once 30 g 3  . megestrol (MEGACE ES) 625 MG/5ML suspension Take 5 mLs (625 mg total) by mouth daily. 150 mL 0  . methocarbamol (ROBAXIN) 750 MG tablet Take 1 tablet (750 mg total) by mouth every 6 (six) hours as needed for muscle spasms. 40 tablet 1  . mirtazapine (REMERON) 15 MG tablet Take 2 tablets (30 mg total) by mouth at bedtime. 60 tablet 0  . nystatin (MYCOSTATIN) 100000 UNIT/ML suspension Take 5 mLs (500,000 Units total) by mouth 4 (four) times daily. Swish and spit or swallow until better. 240 mL 0  . omeprazole (PRILOSEC) 40 MG capsule TAKE 1 CAPSULE (40 MG TOTAL) BY MOUTH DAILY. 30 capsule 0  . ondansetron (ZOFRAN ODT) 8 MG disintegrating tablet Take 1 tablet (8 mg total) by mouth every 8 (eight) hours as needed for nausea or vomiting. 30 tablet 3  . sodium fluoride (PREVIDENT 5000 PLUS) 1.1 % CREA dental cream Patient to use as directed on a toothbrush or alternatively in his fluoride trays as instructed.  Patient is to spit out excess and not swallow.  Repeat nightly. (Patient taking differently: Patient to use as directed on a toothbrush or alternatively in his fluoride trays as instructed.  Patient is to spit out excess and not swallow.  Repeat nightly.) 2 Tube prn  . sucralfate (CARAFATE) 1 g tablet Dissolve 1 tablet in 10 mL H20 and swallow 20 min prior to meals and bedtime. 40 tablet 3  . lidocaine (XYLOCAINE) 2 % solution Use as directed 15 mLs in the mouth or throat every 8 (eight) hours as needed for mouth pain. 300 mL 0   No current facility-administered medications for this visit.   Facility-Administered Medications Ordered in Other Visits  Medication Dose Route Frequency Provider Last Rate Last Admin  . sodium chloride flush (NS) 0.9 % injection 10 mL  10 mL Intracatheter PRN Wileen Duncanson, MD   10 mL at 07/22/20 1638     PHYSICAL EXAMINATION: ECOG PERFORMANCE STATUS: 2 - Symptomatic, <50% confined to bed  Vitals:   08/09/20 1307   BP: 103/76  Pulse: 84  Resp: 18  Temp: (!) 97.1 F (36.2 C)  SpO2: 100%   Filed Weights   08/09/20 1307  Weight: 143 lb 1.6 oz (64.9 kg)    Physical Exam Constitutional:      Comments: Frail   HENT:  Head: Normocephalic and atraumatic.     Mouth/Throat:     Comments: Mucositis has completely resolved. Cardiovascular:     Rate and Rhythm: Normal rate and regular rhythm.     Pulses: Normal pulses.     Heart sounds: Normal heart sounds.  Pulmonary:     Effort: Pulmonary effort is normal.     Breath sounds: Normal breath sounds.  Abdominal:     General: Abdomen is flat. Bowel sounds are normal.  Musculoskeletal:        General: No swelling or tenderness. Normal range of motion.     Cervical back: No rigidity or tenderness.  Lymphadenopathy:     Cervical: No cervical adenopathy.  Skin:    General: Skin is warm and dry.  Neurological:     General: No focal deficit present.     Mental Status: He is alert.  Psychiatric:        Mood and Affect: Mood normal.     LABORATORY DATA:  I have reviewed the data as listed Lab Results  Component Value Date   WBC 2.0 (L) 08/09/2020   HGB 8.7 (L) 08/09/2020   HCT 26.3 (L) 08/09/2020   MCV 96.3 08/09/2020   PLT 122 (L) 08/09/2020     Chemistry      Component Value Date/Time   NA 132 (L) 07/26/2020 1500   K 4.2 07/26/2020 1500   CL 96 (L) 07/26/2020 1500   CO2 28 07/26/2020 1500   BUN 21 (H) 07/26/2020 1500   CREATININE 0.80 07/26/2020 1500      Component Value Date/Time   CALCIUM 9.4 07/26/2020 1500   ALKPHOS 178 (H) 07/26/2020 1500   AST 22 07/26/2020 1500   ALT 25 07/26/2020 1500   BILITOT 0.4 07/26/2020 1500      RADIOGRAPHIC STUDIES: I have personally reviewed the radiological images as listed and agreed with the findings in the report. IR IMAGING GUIDED PORT INSERTION  Result Date: 07/18/2020 CLINICAL DATA:  Squamous cell carcinoma of the tongue, needs durable venous access for planned treatment regimen  EXAM: TUNNELED PORT CATHETER PLACEMENT WITH ULTRASOUND AND FLUOROSCOPIC GUIDANCE FLUOROSCOPY TIME:  6 seconds; less than 1 mGy ANESTHESIA/SEDATION: Intravenous Fentanyl 136mcg and Versed $RemoveBe'2mg'XcANMiTOe$  were administered as conscious sedation during continuous monitoring of the patient's level of consciousness and physiological / cardiorespiratory status by the radiology RN, with a total moderate sedation time of 20 minutes. TECHNIQUE: The procedure, risks, benefits, and alternatives were explained to the patient. Questions regarding the procedure were encouraged and answered. The patient understands and consents to the procedure. Patency of the right IJ vein was confirmed with ultrasound with image documentation. An appropriate skin site was determined. Skin site was marked. Region was prepped using maximum barrier technique including cap and mask, sterile gown, sterile gloves, large sterile sheet, and Chlorhexidine as cutaneous antisepsis. The region was infiltrated locally with 1% lidocaine. Under real-time ultrasound guidance, the right IJ vein was accessed with a 21 gauge micropuncture needle; the needle tip within the vein was confirmed with ultrasound image documentation. Needle was exchanged over a 018 guidewire for transitional dilator, and vascular measurement was performed. A small incision was made on the right anterior chest wall and a subcutaneous pocket fashioned. A low profile device was selected. The power-injectable port was positioned and its catheter tunneled to the right IJ dermatotomy site. The transitional dilator was exchanged over an Amplatz wire for a peel-away sheath, through which the port catheter, which had been trimmed to the appropriate  length, was advanced and positioned under fluoroscopy with its tip at the cavoatrial junction. Spot chest radiograph confirms good catheter position and no pneumothorax. The port was flushed per protocol. The pocket was closed with deep interrupted and  subcuticular continuous 3-0 Monocryl sutures. The incisions were covered with Dermabond then covered with a sterile dressing. The patient tolerated the procedure well. COMPLICATIONS: COMPLICATIONS None immediate IMPRESSION: Technically successful right IJ power-injectable port catheter placement. Ready for routine use. Electronically Signed   By: Lucrezia Europe M.D.   On: 07/18/2020 08:37   All questions were answered. The patient knows to call the clinic with any problems, questions or concerns.     Benay Pike, MD 08/09/2020 1:42 PM

## 2020-08-10 ENCOUNTER — Encounter: Payer: Self-pay | Admitting: Hematology and Oncology

## 2020-08-10 LAB — T4: T4, Total: 6.6 ug/dL (ref 4.5–12.0)

## 2020-08-10 NOTE — Progress Notes (Signed)
Pthas been enrolledw/theMerckCopay Assistance programfor Keytruda for $25,000 from 03/19/20- 03/18/21.[place] Beryle Flock will [place]25.

## 2020-08-11 ENCOUNTER — Telehealth: Payer: Self-pay | Admitting: Hematology and Oncology

## 2020-08-11 NOTE — Telephone Encounter (Signed)
Pt called in asking about his appts. I spoke to [place], Dr. Rob Hickman scheduler, who said she is working on the appts, we are just waiting on add ons. I called the pt, no answer. I left a msg letting the pt know we are working on his appts and will give him a call once we have him scheduled.

## 2020-08-12 ENCOUNTER — Telehealth: Payer: Self-pay | Admitting: Hematology and Oncology

## 2020-08-12 NOTE — Telephone Encounter (Signed)
Scheduled per 05/24 los, patient has been called and notified of 05/31 appointment.

## 2020-08-13 ENCOUNTER — Inpatient Hospital Stay: Payer: Managed Care, Other (non HMO)

## 2020-08-16 ENCOUNTER — Other Ambulatory Visit: Payer: Self-pay

## 2020-08-16 ENCOUNTER — Inpatient Hospital Stay: Payer: Managed Care, Other (non HMO)

## 2020-08-16 ENCOUNTER — Other Ambulatory Visit: Payer: Self-pay | Admitting: Hematology and Oncology

## 2020-08-16 VITALS — BP 98/75 | HR 79 | Temp 98.1°F | Resp 16

## 2020-08-16 DIAGNOSIS — C01 Malignant neoplasm of base of tongue: Secondary | ICD-10-CM

## 2020-08-16 DIAGNOSIS — C7951 Secondary malignant neoplasm of bone: Secondary | ICD-10-CM

## 2020-08-16 DIAGNOSIS — C090 Malignant neoplasm of tonsillar fossa: Secondary | ICD-10-CM

## 2020-08-16 LAB — CMP (CANCER CENTER ONLY)
ALT: 12 U/L (ref 0–44)
AST: 23 U/L (ref 15–41)
Albumin: 3.5 g/dL (ref 3.5–5.0)
Alkaline Phosphatase: 158 U/L — ABNORMAL HIGH (ref 38–126)
Anion gap: 8 (ref 5–15)
BUN: 15 mg/dL (ref 6–20)
CO2: 26 mmol/L (ref 22–32)
Calcium: 9.2 mg/dL (ref 8.9–10.3)
Chloride: 102 mmol/L (ref 98–111)
Creatinine: 0.7 mg/dL (ref 0.61–1.24)
GFR, Estimated: >60 mL/min (ref >60)
Glucose, Bld: 98 mg/dL (ref 70–99)
Potassium: 4.5 mmol/L (ref 3.5–5.1)
Sodium: 136 mmol/L (ref 135–145)
Total Bilirubin: 0.3 mg/dL (ref 0.3–1.2)
Total Protein: 6.8 g/dL (ref 6.5–8.1)

## 2020-08-16 LAB — CBC WITH DIFFERENTIAL/PLATELET
Abs Immature Granulocytes: 0.06 10*3/uL (ref 0.00–0.07)
Basophils Absolute: 0 10*3/uL (ref 0.0–0.1)
Basophils Relative: 0 %
Eosinophils Absolute: 0 10*3/uL (ref 0.0–0.5)
Eosinophils Relative: 1 %
HCT: 27.3 % — ABNORMAL LOW (ref 39.0–52.0)
Hemoglobin: 9.2 g/dL — ABNORMAL LOW (ref 13.0–17.0)
Immature Granulocytes: 2 %
Lymphocytes Relative: 11 %
Lymphs Abs: 0.3 10*3/uL — ABNORMAL LOW (ref 0.7–4.0)
MCH: 32.2 pg (ref 26.0–34.0)
MCHC: 33.7 g/dL (ref 30.0–36.0)
MCV: 95.5 fL (ref 80.0–100.0)
Monocytes Absolute: 0.6 10*3/uL (ref 0.1–1.0)
Monocytes Relative: 20 %
Neutro Abs: 1.8 10*3/uL (ref 1.7–7.7)
Neutrophils Relative %: 66 %
Platelets: 122 10*3/uL — ABNORMAL LOW (ref 150–400)
RBC: 2.86 MIL/uL — ABNORMAL LOW (ref 4.22–5.81)
RDW: 16.7 % — ABNORMAL HIGH (ref 11.5–15.5)
WBC: 2.8 10*3/uL — ABNORMAL LOW (ref 4.0–10.5)
nRBC: 0.7 % — ABNORMAL HIGH (ref 0.0–0.2)

## 2020-08-16 MED ORDER — SODIUM CHLORIDE 0.9 % IV SOLN
522.0000 mg | Freq: Once | INTRAVENOUS | Status: AC
  Administered 2020-08-16: 520 mg via INTRAVENOUS
  Filled 2020-08-16: qty 52

## 2020-08-16 MED ORDER — SODIUM CHLORIDE 0.9 % IV SOLN
10.0000 mg | Freq: Once | INTRAVENOUS | Status: AC
  Administered 2020-08-16: 10 mg via INTRAVENOUS
  Filled 2020-08-16: qty 10

## 2020-08-16 MED ORDER — SODIUM CHLORIDE 0.9 % IV SOLN
Freq: Once | INTRAVENOUS | Status: AC
  Filled 2020-08-16: qty 250

## 2020-08-16 MED ORDER — SODIUM CHLORIDE 0.9 % IV SOLN
800.0000 mg/m2/d | INTRAVENOUS | Status: DC
  Administered 2020-08-16: 5900 mg via INTRAVENOUS
  Filled 2020-08-16: qty 118

## 2020-08-16 MED ORDER — PALONOSETRON HCL INJECTION 0.25 MG/5ML
INTRAVENOUS | Status: AC
  Filled 2020-08-16: qty 5

## 2020-08-16 MED ORDER — PALONOSETRON HCL INJECTION 0.25 MG/5ML
0.2500 mg | Freq: Once | INTRAVENOUS | Status: AC
  Administered 2020-08-16: 0.25 mg via INTRAVENOUS

## 2020-08-16 MED ORDER — SODIUM CHLORIDE 0.9 % IV SOLN
150.0000 mg | Freq: Once | INTRAVENOUS | Status: AC
  Administered 2020-08-16: 150 mg via INTRAVENOUS
  Filled 2020-08-16: qty 150

## 2020-08-16 MED ORDER — SODIUM CHLORIDE 0.9 % IV SOLN
200.0000 mg | Freq: Once | INTRAVENOUS | Status: AC
  Administered 2020-08-16: 200 mg via INTRAVENOUS
  Filled 2020-08-16: qty 8

## 2020-08-16 MED ORDER — SODIUM CHLORIDE 0.9% FLUSH
10.0000 mL | INTRAVENOUS | Status: DC | PRN
Start: 2020-08-16 — End: 2020-08-17
  Administered 2020-08-16: 10 mL via INTRAVENOUS
  Filled 2020-08-16: qty 10

## 2020-08-16 NOTE — Patient Instructions (Signed)

## 2020-08-16 NOTE — Patient Instructions (Signed)
[facility]    Discharge Instructions: Thank you for choosing [facility] to provide your oncology and hematology care.   If you have a lab appointment with the [facility], please go directly to the [facility] and check in at the registration area.   Wear comfortable clothing and clothing appropriate for easy access to any Portacath or PICC line.   We strive to give you quality time with your provider. You may need to reschedule your appointment if you arrive late (15 or more minutes).  Arriving late affects you and other patients whose appointments are after yours.  Also, if you miss three or more appointments without notifying the office, you may be dismissed from the clinic at the provider's discretion.      For prescription refill requests, have your pharmacy contact our office and allow 72 hours for refills to be completed.    Today you received the following chemotherapy and/or immunotherapy agents: pembrolizumab, carboplatin, and fluorouracil.      To help prevent nausea and vomiting after your treatment, we encourage you to take your nausea medication as directed.  BELOW ARE SYMPTOMS THAT SHOULD BE REPORTED IMMEDIATELY: . *FEVER GREATER THAN 100.4 F (38 C) OR HIGHER . *CHILLS OR SWEATING . *NAUSEA AND VOMITING THAT IS NOT CONTROLLED WITH YOUR NAUSEA MEDICATION . *UNUSUAL SHORTNESS OF BREATH . *UNUSUAL BRUISING OR BLEEDING . *URINARY PROBLEMS (pain or burning when urinating, or frequent urination) . *BOWEL PROBLEMS (unusual diarrhea, constipation, pain near the anus) . TENDERNESS IN MOUTH AND THROAT WITH OR WITHOUT PRESENCE OF ULCERS (sore throat, sores in mouth, or a toothache) . UNUSUAL RASH, SWELLING OR PAIN  . UNUSUAL VAGINAL DISCHARGE OR ITCHING   Items with * indicate a potential emergency and should be followed up as soon as possible or go to the Emergency Department if any problems should occur.  Please show the  CHEMOTHERAPY ALERT CARD or IMMUNOTHERAPY ALERT CARD at check-in to the Emergency Department and triage nurse.  Should you have questions after your visit or need to cancel or reschedule your appointment, please contact [facility]  Dept: [phone]  and follow the prompts.  Office hours are 8:00 a.m. to 4:30 p.m. Monday - Friday. Please note that voicemails left after 4:00 p.m. may not be returned until the following business day.  We are closed weekends and major holidays. You have access to a nurse at all times for urgent questions. Please call the main number to the clinic Dept: [phone] and follow the prompts.   For any non-urgent questions, you may also contact your provider using MyChart. We now offer e-Visits for anyone 63 and older to request care online for non-urgent symptoms. For details visit [email or web address].   Also download the MyChart app! Go to the app store, search "MyChart", open the app, select , and log in with your MyChart username and password.  Due to Covid, a mask is required upon entering the hospital/clinic. If you do not have a mask, one will be given to you upon arrival. For doctor visits, patients may have 1 support person aged 78 or older with them. For treatment visits, patients cannot have anyone with them due to current Covid guidelines and our immunocompromised population.

## 2020-08-17 ENCOUNTER — Encounter: Payer: Self-pay | Admitting: Hematology and Oncology

## 2020-08-20 ENCOUNTER — Inpatient Hospital Stay: Payer: Managed Care, Other (non HMO) | Attending: Hematology and Oncology

## 2020-08-20 VITALS — BP 101/66 | HR 95 | Temp 98.9°F | Resp 16

## 2020-08-20 DIAGNOSIS — K1230 Oral mucositis (ulcerative), unspecified: Secondary | ICD-10-CM | POA: Insufficient documentation

## 2020-08-20 DIAGNOSIS — R34 Anuria and oliguria: Secondary | ICD-10-CM | POA: Diagnosis not present

## 2020-08-20 DIAGNOSIS — C01 Malignant neoplasm of base of tongue: Secondary | ICD-10-CM | POA: Diagnosis present

## 2020-08-20 DIAGNOSIS — Z5111 Encounter for antineoplastic chemotherapy: Secondary | ICD-10-CM | POA: Insufficient documentation

## 2020-08-20 DIAGNOSIS — Z5112 Encounter for antineoplastic immunotherapy: Secondary | ICD-10-CM | POA: Diagnosis not present

## 2020-08-20 DIAGNOSIS — Z9221 Personal history of antineoplastic chemotherapy: Secondary | ICD-10-CM | POA: Insufficient documentation

## 2020-08-20 DIAGNOSIS — C7951 Secondary malignant neoplasm of bone: Secondary | ICD-10-CM | POA: Diagnosis not present

## 2020-08-20 DIAGNOSIS — Z79899 Other long term (current) drug therapy: Secondary | ICD-10-CM | POA: Insufficient documentation

## 2020-08-20 DIAGNOSIS — E119 Type 2 diabetes mellitus without complications: Secondary | ICD-10-CM | POA: Insufficient documentation

## 2020-08-20 DIAGNOSIS — C090 Malignant neoplasm of tonsillar fossa: Secondary | ICD-10-CM

## 2020-08-20 MED ORDER — SODIUM CHLORIDE 0.9% FLUSH
10.0000 mL | INTRAVENOUS | Status: DC | PRN
  Administered 2020-08-20: 10 mL
  Filled 2020-08-20: qty 10

## 2020-08-20 MED ORDER — HEPARIN SOD (PORK) LOCK FLUSH 100 UNIT/ML IV SOLN
500.0000 [IU] | Freq: Once | INTRAVENOUS | Status: AC | PRN
  Administered 2020-08-20: 500 [IU]
  Filled 2020-08-20: qty 5

## 2020-08-20 NOTE — Patient Instructions (Signed)

## 2020-08-23 ENCOUNTER — Encounter: Payer: Self-pay | Admitting: Hematology and Oncology

## 2020-08-23 ENCOUNTER — Other Ambulatory Visit: Payer: Self-pay | Admitting: Hematology and Oncology

## 2020-08-23 ENCOUNTER — Telehealth: Payer: Self-pay | Admitting: Hematology and Oncology

## 2020-08-23 MED ORDER — HYDROCODONE-ACETAMINOPHEN 7.5-325 MG/15ML PO SOLN: 10.0000 mL | Freq: Four times a day (QID) | ORAL | 0 refills | Status: DC | PRN

## 2020-08-23 NOTE — Telephone Encounter (Signed)
Scheduled appointment per 06/07 sch msg. Patient is aware. 

## 2020-08-24 ENCOUNTER — Telehealth: Payer: Self-pay | Admitting: Hematology and Oncology

## 2020-08-24 ENCOUNTER — Inpatient Hospital Stay (HOSPITAL_BASED_OUTPATIENT_CLINIC_OR_DEPARTMENT_OTHER): Payer: Managed Care, Other (non HMO) | Admitting: Hematology and Oncology

## 2020-08-24 ENCOUNTER — Encounter: Payer: Self-pay | Admitting: Hematology and Oncology

## 2020-08-24 ENCOUNTER — Other Ambulatory Visit: Payer: Self-pay

## 2020-08-24 ENCOUNTER — Inpatient Hospital Stay: Payer: Managed Care, Other (non HMO)

## 2020-08-24 VITALS — BP 110/70 | HR 109 | Temp 97.8°F | Resp 17 | Ht 70.0 in | Wt 127.2 lb

## 2020-08-24 DIAGNOSIS — R634 Abnormal weight loss: Secondary | ICD-10-CM

## 2020-08-24 DIAGNOSIS — C090 Malignant neoplasm of tonsillar fossa: Secondary | ICD-10-CM

## 2020-08-24 DIAGNOSIS — K1231 Oral mucositis (ulcerative) due to antineoplastic therapy: Secondary | ICD-10-CM

## 2020-08-24 DIAGNOSIS — C01 Malignant neoplasm of base of tongue: Secondary | ICD-10-CM

## 2020-08-24 DIAGNOSIS — R059 Cough, unspecified: Secondary | ICD-10-CM

## 2020-08-24 DIAGNOSIS — C9000 Multiple myeloma not having achieved remission: Secondary | ICD-10-CM

## 2020-08-24 DIAGNOSIS — Z95828 Presence of other vascular implants and grafts: Secondary | ICD-10-CM

## 2020-08-24 MED ORDER — MORPHINE SULFATE (PF) 4 MG/ML IV SOLN
4.0000 mg | Freq: Once | INTRAVENOUS | Status: AC
  Administered 2020-08-24: 4 mg via INTRAVENOUS

## 2020-08-24 MED ORDER — SODIUM CHLORIDE 0.9% FLUSH
10.0000 mL | Freq: Once | INTRAVENOUS | Status: AC
  Administered 2020-08-24: 10 mL via INTRAVENOUS
  Filled 2020-08-24: qty 10

## 2020-08-24 MED ORDER — MORPHINE SULFATE (PF) 4 MG/ML IV SOLN
INTRAVENOUS | Status: AC
  Filled 2020-08-24: qty 1

## 2020-08-24 MED ORDER — HEPARIN SOD (PORK) LOCK FLUSH 100 UNIT/ML IV SOLN
500.0000 [IU] | Freq: Once | INTRAVENOUS | Status: AC
  Administered 2020-08-24: 500 [IU] via INTRAVENOUS
  Filled 2020-08-24: qty 5

## 2020-08-24 MED ORDER — GUAIFENESIN 100 MG/5ML PO SOLN: 5.0000 mL | ORAL | 0 refills | Status: DC | PRN

## 2020-08-24 MED ORDER — SODIUM CHLORIDE 0.9 % IV SOLN
Freq: Once | INTRAVENOUS | Status: AC
  Filled 2020-08-24: qty 250

## 2020-08-24 NOTE — Assessment & Plan Note (Signed)
No clear etiology for cough.  Lungs sounded good on exam today.  Hence no concern for immunotherapy related pneumonitis moreover his cough has been present even before the initiation of immunotherapy.  It has just grown more intense and weak cough reflex as if he has to constantly clear his throat.  We will try to give him some cough suppressant in the interim and evaluate reason for cough on imaging next week. He is ok with this approach

## 2020-08-24 NOTE — Assessment & Plan Note (Signed)
Major weight loss since last visit.  Wife however says that the 142 pounds is likely a fluke.  He may have lost about 8 to 10 pounds since last visit.  We have discussed about G-tube feeding which was patient's wife's concern in the past.  If his PET/CT showed response to disease, I would consider placement of G-tube for nourishment.

## 2020-08-24 NOTE — Assessment & Plan Note (Signed)
This is a very pleasant 50 year old male patient with metastatic HPV positive squamous cell carcinoma currently on treatment with 5-fluorouracil, carboplatin and Keytruda who is here for an interim visit after his second cycle of chemotherapy.  Albert Flowers today has severe mucositis limiting his oral intake and he feels very tired and hence made the follow-up.  He has tried taking liquid hydrocodone, viscous lidocaine but he had a strong gag and basically threw up all the medication.  He is only able to drink water and boost for the past 3 days and has been dropping weight quickly.   He was noted to have severe mucositis on examination likely from 5-fluorouracil.  We have talked about dose reduction or indeed omission of 5-fluorouracil but patient and his wife are very reluctant about de-escalating treatment and would like to see what the PET/CT shows, this is planned for 09/01/2020. We will arrange for IV fluids today as well as IV pain medication.  We have discussed about fentanyl patch for pain management but since his pain is sporadic and not on an every day basis, they are reluctant to try the fentanyl now, may be for next cycle.

## 2020-08-24 NOTE — Progress Notes (Signed)
[facility] NOTE  Patient Care Team: [name], MD as PCP - General (Family Medicine) [name], MD as Consulting Physician (Otolaryngology) [name], MD as Attending Physician (Radiation Oncology) Leota Sauers, RN (Inactive) as Registered Nurse (Oncology) Wynelle Beckmann, Melodie Bouillon, PT as Physical Therapist (Physical Therapy) Sharen Counter, CCC-SLP as Speech Language Pathologist (Speech Pathology) Karie Mainland, RD as Dietitian (Nutrition) Malmfelt, Stephani Police, RN as Oncology Nurse Navigator (Oncology)  CHIEF COMPLAINTS/PURPOSE OF CONSULTATION:   Interim visit for mucositis, worsening cough and poor po intake.  ASSESSMENT & PLAN:   Malignant neoplasm of base of tongue ([place]) This is a very pleasant 50 year old male patient with metastatic HPV positive squamous cell carcinoma currently on treatment with 5-fluorouracil, carboplatin and Keytruda who is here for an interim visit after his second cycle of chemotherapy.  Mr. Specht today has severe mucositis limiting his oral intake and he feels very tired and hence made the follow-up.  He has tried taking liquid hydrocodone, viscous lidocaine but he had a strong gag and basically threw up all the medication.  He is only able to drink water and boost for the past 3 days and has been dropping weight quickly.   He was noted to have severe mucositis on examination likely from 5-fluorouracil.  We have talked about dose reduction or indeed omission of 5-fluorouracil but patient and his wife are very reluctant about de-escalating treatment and would like to see what the PET/CT shows, this is planned for 09/01/2020. We will arrange for IV fluids today as well as IV pain medication.  We have discussed about fentanyl patch for pain management but since his pain is sporadic and not on an every day basis, they are reluctant to try the fentanyl now, may be for next cycle.  Mucositis due to chemotherapy Severe  mucositis affecting the lips, roof of mouth with scattered ulcerations and erythema limiting any solid food intake.  He is able to drink about 2 bottles of water and 1-2 boosts a day for the past 3 days. He has been prescribed Magic mouthwash/liquid hydrocodone for pain control but he is not able to keep the medicine in because of some strong gag reflex.  We will arrange for IV morphine today.  We will also try to arrange for fluids and pain management on Friday of this week and will reevaluate him in a week.  I have discussed about de-escalating treatment and omitting 5-fluorouracil but he would like to wait until he sees the PET/CT results.  Weight loss, unintentional Major weight loss since last visit.  Wife however says that the 142 pounds is likely a fluke.  He may have lost about 8 to 10 pounds since last visit.  We have discussed about G-tube feeding which was patient's wife's concern in the past.  If his PET/CT showed response to disease, I would consider placement of G-tube for nourishment.  Cough No clear etiology for cough.  Lungs sounded good on exam today.  Hence no concern for immunotherapy related pneumonitis moreover his cough has been present even before the initiation of immunotherapy.  It has just grown more intense and weak cough reflex as if he has to constantly clear his throat.  We will try to give him some cough suppressant in the interim and evaluate reason for cough on imaging next week. He is ok with this approach  No orders of the defined types were placed in this encounter.   HISTORY OF PRESENTING  ILLNESS:   Albert Flowers 50 y.o. male is here because of metastatic HPV positive BOT cancer  Oncology History Overview Note   In summary, patient presents with Dr. Constance Holster of ENT in late 03/2018 for evaluation of an enlarging right neck mass.  CT neck showed necrotic right Level II and III necrotic LN's, possibly from oropharyngeal oral cavity primary, but the study was  limited due to streak artifacts.  He underwent FNA of the R Level II LN, which showed squamous cell carcinoma, p16+.  PET in 05/2018 showed asymmetric FDG uptake in the right tonsil/base of the tongue, likely the primary malignancy.  In addition, there were two FDG-avid R Level II LN's without evidence of contralateral cervical LN or metastatic disease.  Given the relatively low volume disease, Dr Maylon Peppers recommended upfront surgery, followed by adjuvant therapy based on the final pathology. He had TORS on 06/16/2018.  Pathology from the procedure showed: invasive p16-positive (HPV-related) squamous cell carcinoma, 1.4 cm, negative margins. Out of 24 biopsied lymph nodes, only one was positive for metastatic carcinoma (1/24).  This node was 4.5cm but with no extracapsular extension.  No LVSI and no PNI.  He underwent right neck dissection but not left neck dissection.  At postop follow up, they agreed to proceed with observation and reserve radiation therapy for any possible future recurrence.   At follow up on 04/07/2019, Dr. [name] noted a new suspicious left upper neck lymph node. Neck CT performed that day revealed: new heterogeneous 2.2 cm contralateral left level 2a lymph node suspicious for contralateral nodal metastasis; indeterminate mild interval increase in size of several left level 2a and 2b lymph nodes.   PET scan performed on 04/16/2019 showed: single hypermetabolic left cervical lymph node; mild asymmetric FDG uptake in left glossotonsillar sulcus; focal FDG uptake in L1 vertebral body without CT correlate; otherwise, no malignant-range FDG uptake elsewhere.  He underwent left tonsillectomy and left neck dissection on 05/04/2019 with pathology revealing: benign left tonsil. Out of a total of 25 biopsied lymph nodes, only one was positive for metastatic carcinoma but negative for extracapsular extension and positive for p16.  He was advised adjuvant radiation at this time. He completed  radiation on 07/24/2019. He was seen for FU by Dr Isidore Moos in December when he was doing well. He then started complaining of back pain in February. This led to MRI and PET imaging.  04/26/2020 MRI showed extensive metastatic disease throughout the lumbar spine as well as the sacrum and left iliac bone. No pathologic fracture or epidural Tumor.  05/04/2020 PET CT showed widespread hypermetabolic bone mets. 3.5 cm necrotic hypermetabolic met lesion in right liver. Hypermetabolic uptake in right hilum associated with 2 hypermetabolic lung nodules. Patchy/nodular ground-glass opacity in the right lung having a tree-in-bud configuration. This would be an atypical appearance for metastatic disease and infectious etiology is favored, potentially Atypical.  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 Right level 2 necrotic nodal mass and level 3 necrotic lymph node are highly concerning for nodal metastasis from head and neck primary neoplasm likely oropharynx or the oral cavity. However, evaluation for primary neoplasm in these regions is markedly limited due to streak artifacts from dental amalgam and closed airway possibly secondary to patient's holding breath/swallowing. Recommend direct visual inspection and possibly PET CT scan for  further evaluation.  2. Mild asymmetry and fullness of the right nasopharyngeal region, nonspecific.  3. 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   05/07/2018 Procedure   FNA of the R IJ Level II LN    05/07/2018 Pathology Results   ACCESSION NUMBER: [id number]  Right IJ chain lymph node, FNA Squamous cell carcinoma, metastatic; p16+    05/28/2018 Imaging   PET: IMPRESSION: 1. Asymmetric hypermetabolic activity in the RIGHT lingular tonsil/base of tongue region favored  primary carcinoma. 2. Two hypermetabolic RIGHT level II metastatic lymph nodes. 3. No LEFT cervical lymphadenopathy.  No distant metastatic disease.   Malignant neoplasm of base of tongue ([place])  04/24/2018 Imaging   CT neck w/ contrast: 1. Right level 2 necrotic nodal mass and level 3 necrotic lymph node are highly concerning for nodal metastasis from head and neck primary neoplasm likely oropharynx or the oral cavity. However, evaluation for primary neoplasm in these regions is markedly limited due to streak artifacts from dental amalgam and closed airway possibly secondary to patient's holding breath/swallowing. Recommend direct visual inspection and possibly PET CT scan for further evaluation.  2. Mild asymmetry and fullness of the right nasopharyngeal region, nonspecific.  3. 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   05/07/2018 Procedure   FNA of the right cervical LN   05/07/2018 Pathology Results   ACCESSION NUMBER: [id number]  Right IJ chain lymph node, FNA Squamous cell carcinoma, metastatic; p16+    05/28/2018 Imaging   PET: IMPRESSION: 1. Asymmetric hypermetabolic activity in the RIGHT lingular tonsil/base of tongue region favored primary carcinoma. 2. Two hypermetabolic RIGHT level II metastatic lymph nodes. 3. No LEFT cervical lymphadenopathy.  No distant metastatic disease.   06/02/2018 Initial Diagnosis   Malignant neoplasm of base of tongue ([place])   06/02/2018 Cancer Staging   Staging form: Pharynx - HPV-Mediated Oropharynx, AJCC 8th Edition - Clinical: Stage I (cT2, cN1, cM0, p16+) - Signed by [name], MD on 06/02/2018   05/29/2019 Cancer Staging   Staging form: Pharynx - HPV-Mediated Oropharynx, AJCC 8th Edition - Pathologic stage from 05/29/2019: Stage I (rpT0, pN1, cM0, p16+) - Signed by [name], MD on 05/29/2019   06/27/2020 - 06/27/2020 Chemotherapy         07/18/2020 -  Chemotherapy    Patient is on Treatment Plan: HEAD/NECK PEMBROLIZUMAB  + CARBOPLATIN + 5FU Q21D X 6 CYCLES / PEMBROLIZUMAB Q21D      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  He recently received his cycle 2 of carbo/5-FU/Keytruda on 531 through August 20, 2020.  He has noticed that his mouth has become sore since Sunday and it has been getting worse.  He is not able to swallow anything except for water and boost.  He has not eaten solid food in 3 days.  He has been dropping weight drastically and hence the wife called about these above-mentioned symptoms and made the follow-up.  He states that he has tried the viscous lidocaine/Magic mouthwash and liquid hydrocodone but he had a strong gag so he could not keep the medication in him and  had to throw up.  He was hoping we can try different pain medication which she can swallow. With regards to cough, keeps him up all night at 7-week cough as if he has to constantly clear his throat.  No fevers or chills or worsening shortness of breath.  He has some nausea and some gagging without overt vomiting.  No diarrhea.  He denies other symptoms for me today.  He just feels very weak since he could not eat or drink very well.  Rest of the pertinent review of systems reviewed and negative.  MEDICAL HISTORY:  Past Medical History:  Diagnosis Date  . Diabetes mellitus without complication ([place])     SURGICAL HISTORY: Past Surgical History:  Procedure Laterality Date  . IR IMAGING GUIDED PORT INSERTION  07/15/2020  . left neck dissection Left 05/04/2019   Left Neck Dissection by Dr. [name] at [facility].   . neck sugery     as a child "had a knot removed from neck" not sure which side.   . RIght neck  dissection Right 06/16/2018   TORS and right neck dissection. Dr. [name] at [place]: Social History   Socioeconomic History  . Marital status: Married    Spouse name: Not on file  . Number of children: 4  . Years of education: Not on file  . Highest education level: Not on file  Occupational History  . Not on file  Tobacco Use  . Smoking status: Never Smoker  . Smokeless tobacco: Never Used  Vaping Use  . Vaping Use: Never used  Substance and Sexual Activity  . Alcohol use: Yes    Comment: occasional  . Drug use: Never  . Sexual activity: Not on file  Other Topics Concern  . Not on file  Social History Narrative  . Not on file   Social Determinants of Health   Financial Resource Strain: Not on file  Food Insecurity: Not on file  Transportation Needs: Not on file  Physical Activity: Not on file  Stress: Not on file  Social Connections: Not on file  Intimate Partner Violence: Not on file    FAMILY HISTORY: No family history on file.  ALLERGIES:  is allergic to penicillins.  MEDICATIONS:  Current Outpatient Medications  Medication Sig Dispense Refill  . chlorproMAZINE (THORAZINE) 10 MG tablet Take 1 tablet (10 mg total) by mouth 3 (three) times daily as needed for hiccoughs. 20 tablet 0  . dexamethasone (DECADRON) 4 MG tablet Take 2 tablets (8 mg total) by mouth daily. Start the day after carboplatin chemotherapy for 3 days. 30 tablet 1  . diphenhydrAMINE (BENADRYL) 25 MG tablet Take 25 mg by mouth at bedtime.    . gabapentin (NEURONTIN) 300 MG capsule Take 900 mg by mouth 3 (three) times daily.    Marland Kitchen guaiFENesin (ROBITUSSIN) 100 MG/5ML SOLN Take 5 mLs (100 mg total) by mouth every 4 (four) hours as needed for cough or to loosen phlegm. 236 mL 0  . HYDROcodone-acetaminophen (HYCET) 7.5-325 mg/15 ml solution Take 10 mLs by mouth 4 (four) times daily as needed for moderate pain. 473 mL 0  . lidocaine (XYLOCAINE) 2 % solution Use as directed 15 mLs in  the mouth or throat every 8 (eight) hours as needed for mouth pain. 300 mL 0  . lidocaine-prilocaine (EMLA) cream Apply to affected area once 30 g 3  . megestrol (MEGACE ES) 625 MG/5ML suspension Take 5 mLs (625 mg total) by mouth daily. [place]  mL 0  . methocarbamol (ROBAXIN) 750 MG tablet Take 1 tablet (750 mg total) by mouth every 6 (six) hours as needed for muscle spasms. 40 tablet 1  . mirtazapine (REMERON) 15 MG tablet Take 2 tablets (30 mg total) by mouth at bedtime. 60 tablet 0  . nystatin (MYCOSTATIN) 100000 UNIT/ML suspension Take 5 mLs (500,000 Units total) by mouth 4 (four) times daily. Swish and spit or swallow until better. 240 mL 0  . omeprazole (PRILOSEC) 40 MG capsule TAKE 1 CAPSULE (40 MG TOTAL) BY MOUTH DAILY. 30 capsule 0  . ondansetron (ZOFRAN ODT) 8 MG disintegrating tablet Take 1 tablet (8 mg total) by mouth every 8 (eight) hours as needed for nausea or vomiting. 30 tablet 3  . sodium fluoride (PREVIDENT 5000 PLUS) 1.1 % CREA dental cream Patient to use as directed on a toothbrush or alternatively in his fluoride trays as instructed.  Patient is to spit out excess and not swallow.  Repeat nightly. (Patient taking differently: Patient to use as directed on a toothbrush or alternatively in his fluoride trays as instructed.  Patient is to spit out excess and not swallow.  Repeat nightly.) 2 Tube prn  . sucralfate (CARAFATE) 1 g tablet Dissolve 1 tablet in 10 mL H20 and swallow 20 min prior to meals and bedtime. 40 tablet 3   No current facility-administered medications for this visit.   Facility-Administered Medications Ordered in Other Visits  Medication Dose Route Frequency Provider Last Rate Last Admin  . heparin lock flush 100 unit/mL  500 Units Intravenous Once Akane Tessier, MD      . sodium chloride flush (NS) 0.9 % injection 10 mL  10 mL Intracatheter PRN Benay Pike, MD   10 mL at 07/22/20 1638  . sodium chloride flush (NS) 0.9 % injection 10 mL  10 mL Intravenous Once  Annalycia Done, MD         PHYSICAL EXAMINATION: ECOG PERFORMANCE STATUS: 2 - Symptomatic, <50% confined to bed  Vitals:   08/24/20 1059  BP: 110/70  Pulse: (!) 109  Resp: 17  Temp: 97.8 F (36.6 C)  SpO2: 99%   Filed Weights   08/24/20 1059  Weight: 127 lb 3.2 oz (57.7 kg)    Physical Exam Constitutional:      Appearance: He is ill-appearing.     Comments: Frail   HENT:     Head: No contusion.     Mouth/Throat:     Comments: Severe mucositis involving the lips, roof with scattered ulceration and erythema Cardiovascular:     Rate and Rhythm: Normal rate and regular rhythm.     Pulses: Normal pulses.     Heart sounds: Normal heart sounds.  Pulmonary:     Effort: Pulmonary effort is normal.     Breath sounds: Normal breath sounds.  Abdominal:     General: Abdomen is flat. Bowel sounds are normal.  Musculoskeletal:        General: No swelling or tenderness. Normal range of motion.     Cervical back: No rigidity or tenderness.  Lymphadenopathy:     Cervical: No cervical adenopathy.  Skin:    General: Skin is warm and dry.  Neurological:     General: No focal deficit present.     Mental Status: He is alert.  Psychiatric:        Mood and Affect: Mood normal.     LABORATORY DATA:  I have reviewed the data as listed Lab Results  Component Value Date  WBC 2.8 (L) 08/16/2020   HGB 9.2 (L) 08/16/2020   HCT 27.3 (L) 08/16/2020   MCV 95.5 08/16/2020   PLT 122 (L) 08/16/2020     Chemistry      Component Value Date/Time   NA 136 08/16/2020 1129   K 4.5 08/16/2020 1129   CL 102 08/16/2020 1129   CO2 26 08/16/2020 1129   BUN 15 08/16/2020 1129   CREATININE 0.70 08/16/2020 1129      Component Value Date/Time   CALCIUM 9.2 08/16/2020 1129   ALKPHOS 158 (H) 08/16/2020 1129   AST 23 08/16/2020 1129   ALT 12 08/16/2020 1129   BILITOT 0.3 08/16/2020 1129      RADIOGRAPHIC STUDIES: I have personally reviewed the radiological images as listed and agreed  with the findings in the report. No results found. All questions were answered. The patient knows to call the clinic with any problems, questions or concerns.     Benay Pike, MD 08/24/2020 12:47 PM

## 2020-08-24 NOTE — Assessment & Plan Note (Signed)
Severe mucositis affecting the lips, roof of mouth with scattered ulcerations and erythema limiting any solid food intake.  He is able to drink about 2 bottles of water and 1-2 boosts a day for the past 3 days. He has been prescribed Magic mouthwash/liquid hydrocodone for pain control but he is not able to keep the medicine in because of some strong gag reflex.  We will arrange for IV morphine today.  We will also try to arrange for fluids and pain management on Friday of this week and will reevaluate him in a week.  I have discussed about de-escalating treatment and omitting 5-fluorouracil but he would like to wait until he sees the PET/CT results.

## 2020-08-24 NOTE — Patient Instructions (Signed)
Rehydration, Adult Rehydration is the replacement of body fluids, salts, and minerals (electrolytes) that are lost during dehydration. Dehydration is when there is not enough water or other fluids in the body. This happens when you lose more fluids than you take in. Common causes of dehydration include:  Not drinking enough fluids. This can occur when you are ill or doing activities that require a lot of energy, especially in hot weather.  Conditions that cause loss of water or other fluids, such as diarrhea, vomiting, sweating, or urinating a lot.  Other illnesses, such as fever or infection.  Certain medicines, such as those that remove excess fluid from the body (diuretics). Symptoms of mild or moderate dehydration may include thirst, dry lips and mouth, and dizziness. Symptoms of severe dehydration may include increased heart rate, confusion, fainting, and not urinating. For severe dehydration, you may need to get fluids through an IV at the hospital. For mild or moderate dehydration, you can usually rehydrate at home by drinking certain fluids as told by your health care provider. What are the risks? Generally, rehydration is safe. However, taking in too much fluid (overhydration) can be a problem. This is rare. Overhydration can cause an electrolyte imbalance, kidney failure, or a decrease in salt (sodium) levels in the body. Supplies needed You will need an oral rehydration solution (ORS) if your health care provider tells you to use one. This is a drink to treat dehydration. It can be found in pharmacies and retail stores. How to rehydrate Fluids Follow instructions from your health care provider for rehydration. The kind of fluid and the amount you should drink depend on your condition. In general, you should choose drinks that you prefer.  If told by your health care provider, drink an ORS. ? Make an ORS by following instructions on the package. ? Start by drinking small amounts,  about  cup (120 mL) every 5-10 minutes. ? Slowly increase how much you drink until you have taken the amount recommended by your health care provider.  Drink enough clear fluids to keep your urine pale yellow. If you were told to drink an ORS, finish it first, then start slowly drinking other clear fluids. Drink fluids such as: ? Water. This includes sparkling water and flavored water. Drinking only water can lead to having too little sodium in your body (hyponatremia). Follow the advice of your health care provider. ? Water from ice chips you suck on. ? Fruit juice with water you add to it (diluted). ? Sports drinks. ? Hot or cold herbal teas. ? Broth-based soups. ? Milk or milk products. Food Follow instructions from your health care provider about what to eat while you rehydrate. Your health care provider may recommend that you slowly begin eating regular foods in small amounts.  Eat foods that contain a healthy balance of electrolytes, such as bananas, oranges, potatoes, tomatoes, and spinach.  Avoid foods that are greasy or contain a lot of sugar. In some cases, you may get nutrition through a feeding tube that is passed through your nose and into your stomach (nasogastric tube, or NG tube). This may be done if you have uncontrolled vomiting or diarrhea.   Beverages to avoid Certain beverages may make dehydration worse. While you rehydrate, avoid drinking alcohol.   How to tell if you are recovering from dehydration You may be recovering from dehydration if:  You are urinating more often than before you started rehydrating.  Your urine is pale yellow.  Your energy level   improves.  You vomit less frequently.  You have diarrhea less frequently.  Your appetite improves or returns to normal.  You feel less dizzy or less light-headed.  Your skin tone and color start to look more normal. Follow these instructions at home:  Take over-the-counter and prescription medicines only  as told by your health care provider.  Do not take sodium tablets. Doing this can lead to having too much sodium in your body (hypernatremia). Contact a health care provider if:  You continue to have symptoms of mild or moderate dehydration, such as: ? Thirst. ? Dry lips. ? Slightly dry mouth. ? Dizziness. ? Dark urine or less urine than normal. ? Muscle cramps.  You continue to vomit or have diarrhea. Get help right away if you:  Have symptoms of dehydration that get worse.  Have a fever.  Have a severe headache.  Have been vomiting and the following happens: ? Your vomiting gets worse or does not go away. ? Your vomit includes blood or green matter (bile). ? You cannot eat or drink without vomiting.  Have problems with urination or bowel movements, such as: ? Diarrhea that gets worse or does not go away. ? Blood in your stool (feces). This may cause stool to look black and tarry. ? Not urinating, or urinating only a small amount of very dark urine, within 6-8 hours.  Have trouble breathing.  Have symptoms that get worse with treatment. These symptoms may represent a serious problem that is an emergency. Do not wait to see if the symptoms will go away. Get medical help right away. Call your local emergency services (911 in the [place] Do not drive yourself to the hospital. Summary  Rehydration is the replacement of body fluids and minerals (electrolytes) that are lost during dehydration.  Follow instructions from your health care provider for rehydration. The kind of fluid and amount you should drink depend on your condition.  Slowly increase how much you drink until you have taken the amount recommended by your health care provider.  Contact your health care provider if you continue to show signs of mild or moderate dehydration. This information is not intended to replace advice given to you by your health care provider. Make sure you discuss any questions you have with  your health care provider. Document Revised: 05/06/2019 Document Reviewed: 03/16/2019 Elsevier Patient Education  2021 [employer]  

## 2020-08-24 NOTE — Telephone Encounter (Signed)
Scheduled appointment per 06/08 sch msg. Patient is aware. 

## 2020-08-25 ENCOUNTER — Other Ambulatory Visit: Payer: Self-pay

## 2020-08-25 MED ORDER — FLUCONAZOLE 200 MG PO TABS: 200.0000 mg | ORAL_TABLET | Freq: Every day | ORAL | 0 refills | Status: AC

## 2020-08-25 NOTE — Progress Notes (Signed)
Fluconazole orders sent per Dr Chryl Heck

## 2020-08-27 ENCOUNTER — Inpatient Hospital Stay: Payer: Managed Care, Other (non HMO)

## 2020-08-27 ENCOUNTER — Other Ambulatory Visit: Payer: Self-pay

## 2020-08-27 VITALS — BP 107/70 | HR 87 | Temp 97.9°F | Resp 18

## 2020-08-27 DIAGNOSIS — C7951 Secondary malignant neoplasm of bone: Secondary | ICD-10-CM | POA: Diagnosis not present

## 2020-08-27 DIAGNOSIS — K1230 Oral mucositis (ulcerative), unspecified: Secondary | ICD-10-CM | POA: Diagnosis not present

## 2020-08-27 DIAGNOSIS — Z5112 Encounter for antineoplastic immunotherapy: Secondary | ICD-10-CM | POA: Diagnosis not present

## 2020-08-27 DIAGNOSIS — Z79899 Other long term (current) drug therapy: Secondary | ICD-10-CM | POA: Diagnosis not present

## 2020-08-27 DIAGNOSIS — R34 Anuria and oliguria: Secondary | ICD-10-CM | POA: Diagnosis not present

## 2020-08-27 DIAGNOSIS — A4189 Other specified sepsis: Secondary | ICD-10-CM | POA: Diagnosis not present

## 2020-08-27 DIAGNOSIS — U071 COVID-19: Secondary | ICD-10-CM | POA: Diagnosis not present

## 2020-08-27 DIAGNOSIS — C01 Malignant neoplasm of base of tongue: Secondary | ICD-10-CM | POA: Diagnosis present

## 2020-08-27 DIAGNOSIS — Z9221 Personal history of antineoplastic chemotherapy: Secondary | ICD-10-CM | POA: Diagnosis not present

## 2020-08-27 DIAGNOSIS — Z95828 Presence of other vascular implants and grafts: Secondary | ICD-10-CM

## 2020-08-27 DIAGNOSIS — K1231 Oral mucositis (ulcerative) due to antineoplastic therapy: Secondary | ICD-10-CM

## 2020-08-27 DIAGNOSIS — E119 Type 2 diabetes mellitus without complications: Secondary | ICD-10-CM | POA: Diagnosis not present

## 2020-08-27 DIAGNOSIS — Z5111 Encounter for antineoplastic chemotherapy: Secondary | ICD-10-CM | POA: Diagnosis not present

## 2020-08-27 MED ORDER — HEPARIN SOD (PORK) LOCK FLUSH 100 UNIT/ML IV SOLN
500.0000 [IU] | Freq: Once | INTRAVENOUS | Status: AC
  Administered 2020-08-27: 500 [IU] via INTRAVENOUS
  Filled 2020-08-27: qty 5

## 2020-08-27 MED ORDER — SODIUM CHLORIDE 0.9 % IV SOLN
Freq: Once | INTRAVENOUS | Status: AC
  Filled 2020-08-27: qty 250

## 2020-08-27 MED ORDER — SODIUM CHLORIDE 0.9% FLUSH
10.0000 mL | Freq: Once | INTRAVENOUS | Status: AC
  Administered 2020-08-27: 10 mL via INTRAVENOUS
  Filled 2020-08-27: qty 10

## 2020-08-27 NOTE — Patient Instructions (Signed)

## 2020-08-28 ENCOUNTER — Emergency Department (HOSPITAL_COMMUNITY): Payer: Managed Care, Other (non HMO)

## 2020-08-28 ENCOUNTER — Inpatient Hospital Stay (HOSPITAL_COMMUNITY)
Admission: EM | Admit: 2020-08-28 | Discharge: 2020-08-30 | DRG: 871 | Disposition: A | Discharge status: Home / Self Care | Payer: Managed Care, Other (non HMO) | Attending: Obstetrics and Gynecology | Admitting: Obstetrics and Gynecology

## 2020-08-28 ENCOUNTER — Encounter: Payer: Self-pay | Admitting: Hematology and Oncology

## 2020-08-28 ENCOUNTER — Encounter (HOSPITAL_COMMUNITY): Payer: Self-pay | Admitting: Emergency Medicine

## 2020-08-28 ENCOUNTER — Other Ambulatory Visit: Payer: Self-pay

## 2020-08-28 DIAGNOSIS — U071 COVID-19: Secondary | ICD-10-CM | POA: Diagnosis present

## 2020-08-28 DIAGNOSIS — Z880 Allergy status to penicillin: Secondary | ICD-10-CM

## 2020-08-28 DIAGNOSIS — D849 Immunodeficiency, unspecified: Secondary | ICD-10-CM | POA: Diagnosis present

## 2020-08-28 DIAGNOSIS — E860 Dehydration: Secondary | ICD-10-CM

## 2020-08-28 DIAGNOSIS — E46 Unspecified protein-calorie malnutrition: Secondary | ICD-10-CM | POA: Diagnosis present

## 2020-08-28 DIAGNOSIS — C01 Malignant neoplasm of base of tongue: Secondary | ICD-10-CM | POA: Diagnosis present

## 2020-08-28 DIAGNOSIS — R531 Weakness: Secondary | ICD-10-CM | POA: Diagnosis not present

## 2020-08-28 DIAGNOSIS — E861 Hypovolemia: Secondary | ICD-10-CM | POA: Diagnosis present

## 2020-08-28 DIAGNOSIS — A419 Sepsis, unspecified organism: Secondary | ICD-10-CM | POA: Diagnosis present

## 2020-08-28 DIAGNOSIS — R112 Nausea with vomiting, unspecified: Secondary | ICD-10-CM | POA: Diagnosis present

## 2020-08-28 DIAGNOSIS — E119 Type 2 diabetes mellitus without complications: Secondary | ICD-10-CM | POA: Diagnosis present

## 2020-08-28 DIAGNOSIS — E871 Hypo-osmolality and hyponatremia: Secondary | ICD-10-CM | POA: Diagnosis present

## 2020-08-28 DIAGNOSIS — C7951 Secondary malignant neoplasm of bone: Secondary | ICD-10-CM | POA: Diagnosis present

## 2020-08-28 DIAGNOSIS — A4189 Other specified sepsis: Secondary | ICD-10-CM | POA: Diagnosis present

## 2020-08-28 DIAGNOSIS — D61818 Other pancytopenia: Secondary | ICD-10-CM | POA: Diagnosis present

## 2020-08-28 LAB — COMPREHENSIVE METABOLIC PANEL
ALT: 16 U/L (ref 0–44)
AST: 18 U/L (ref 15–41)
Albumin: 3.1 g/dL — ABNORMAL LOW (ref 3.5–5.0)
Alkaline Phosphatase: 110 U/L (ref 38–126)
Anion gap: 9 (ref 5–15)
BUN: 12 mg/dL (ref 6–20)
CO2: 28 mmol/L (ref 22–32)
Calcium: 8.9 mg/dL (ref 8.9–10.3)
Chloride: 96 mmol/L — ABNORMAL LOW (ref 98–111)
Creatinine, Ser: 0.38 mg/dL — ABNORMAL LOW (ref 0.61–1.24)
GFR, Estimated: >60 mL/min (ref >60)
Glucose, Bld: 153 mg/dL — ABNORMAL HIGH (ref 70–99)
Potassium: 4.2 mmol/L (ref 3.5–5.1)
Sodium: 133 mmol/L — ABNORMAL LOW (ref 135–145)
Total Bilirubin: 0.2 mg/dL — ABNORMAL LOW (ref 0.3–1.2)
Total Protein: 6.8 g/dL (ref 6.5–8.1)

## 2020-08-28 LAB — URINALYSIS, ROUTINE W REFLEX MICROSCOPIC
Bilirubin Urine: NEGATIVE
Glucose, UA: NEGATIVE mg/dL
Hgb urine dipstick: NEGATIVE
Ketones, ur: NEGATIVE mg/dL
Leukocytes,Ua: NEGATIVE
Nitrite: NEGATIVE
Protein, ur: NEGATIVE mg/dL
Specific Gravity, Urine: 1.014 (ref 1.005–1.030)
pH: 7 (ref 5.0–8.0)

## 2020-08-28 LAB — CBC WITH DIFFERENTIAL/PLATELET
Abs Immature Granulocytes: 0.08 10*3/uL — ABNORMAL HIGH (ref 0.00–0.07)
Basophils Absolute: 0 10*3/uL (ref 0.0–0.1)
Basophils Relative: 1 %
Eosinophils Absolute: 0 10*3/uL (ref 0.0–0.5)
Eosinophils Relative: 1 %
HCT: 21.8 % — ABNORMAL LOW (ref 39.0–52.0)
Hemoglobin: 7.4 g/dL — ABNORMAL LOW (ref 13.0–17.0)
Immature Granulocytes: 8 %
Lymphocytes Relative: 17 %
Lymphs Abs: 0.2 10*3/uL — ABNORMAL LOW (ref 0.7–4.0)
MCH: 32.9 pg (ref 26.0–34.0)
MCHC: 33.9 g/dL (ref 30.0–36.0)
MCV: 96.9 fL (ref 80.0–100.0)
Monocytes Absolute: 0.3 10*3/uL (ref 0.1–1.0)
Monocytes Relative: 28 %
Neutro Abs: 0.5 10*3/uL — ABNORMAL LOW (ref 1.7–7.7)
Neutrophils Relative %: 45 %
Platelets: 42 10*3/uL — ABNORMAL LOW (ref 150–400)
RBC: 2.25 MIL/uL — ABNORMAL LOW (ref 4.22–5.81)
RDW: 14 % (ref 11.5–15.5)
WBC: 1.1 10*3/uL — CL (ref 4.0–10.5)
nRBC: 0 % (ref 0.0–0.2)

## 2020-08-28 LAB — RESP PANEL BY RT-PCR (FLU A&B, COVID) ARPGX2
Influenza A by PCR: NEGATIVE
Influenza B by PCR: NEGATIVE
SARS Coronavirus 2 by RT PCR: POSITIVE — AB

## 2020-08-28 LAB — LACTIC ACID, PLASMA
Lactic Acid, Venous: 0.7 mmol/L (ref 0.5–1.9)
Lactic Acid, Venous: 0.6 mmol/L (ref 0.5–1.9)

## 2020-08-28 IMAGING — DX DG CHEST 1V PORT
1 series · 1 of 1 positions shown · non-contrast
Comparison: None.

CLINICAL DATA: Chemotherapy for cancer.  Cough.

EXAM:
PORTABLE CHEST 1 VIEW

[chest ap]
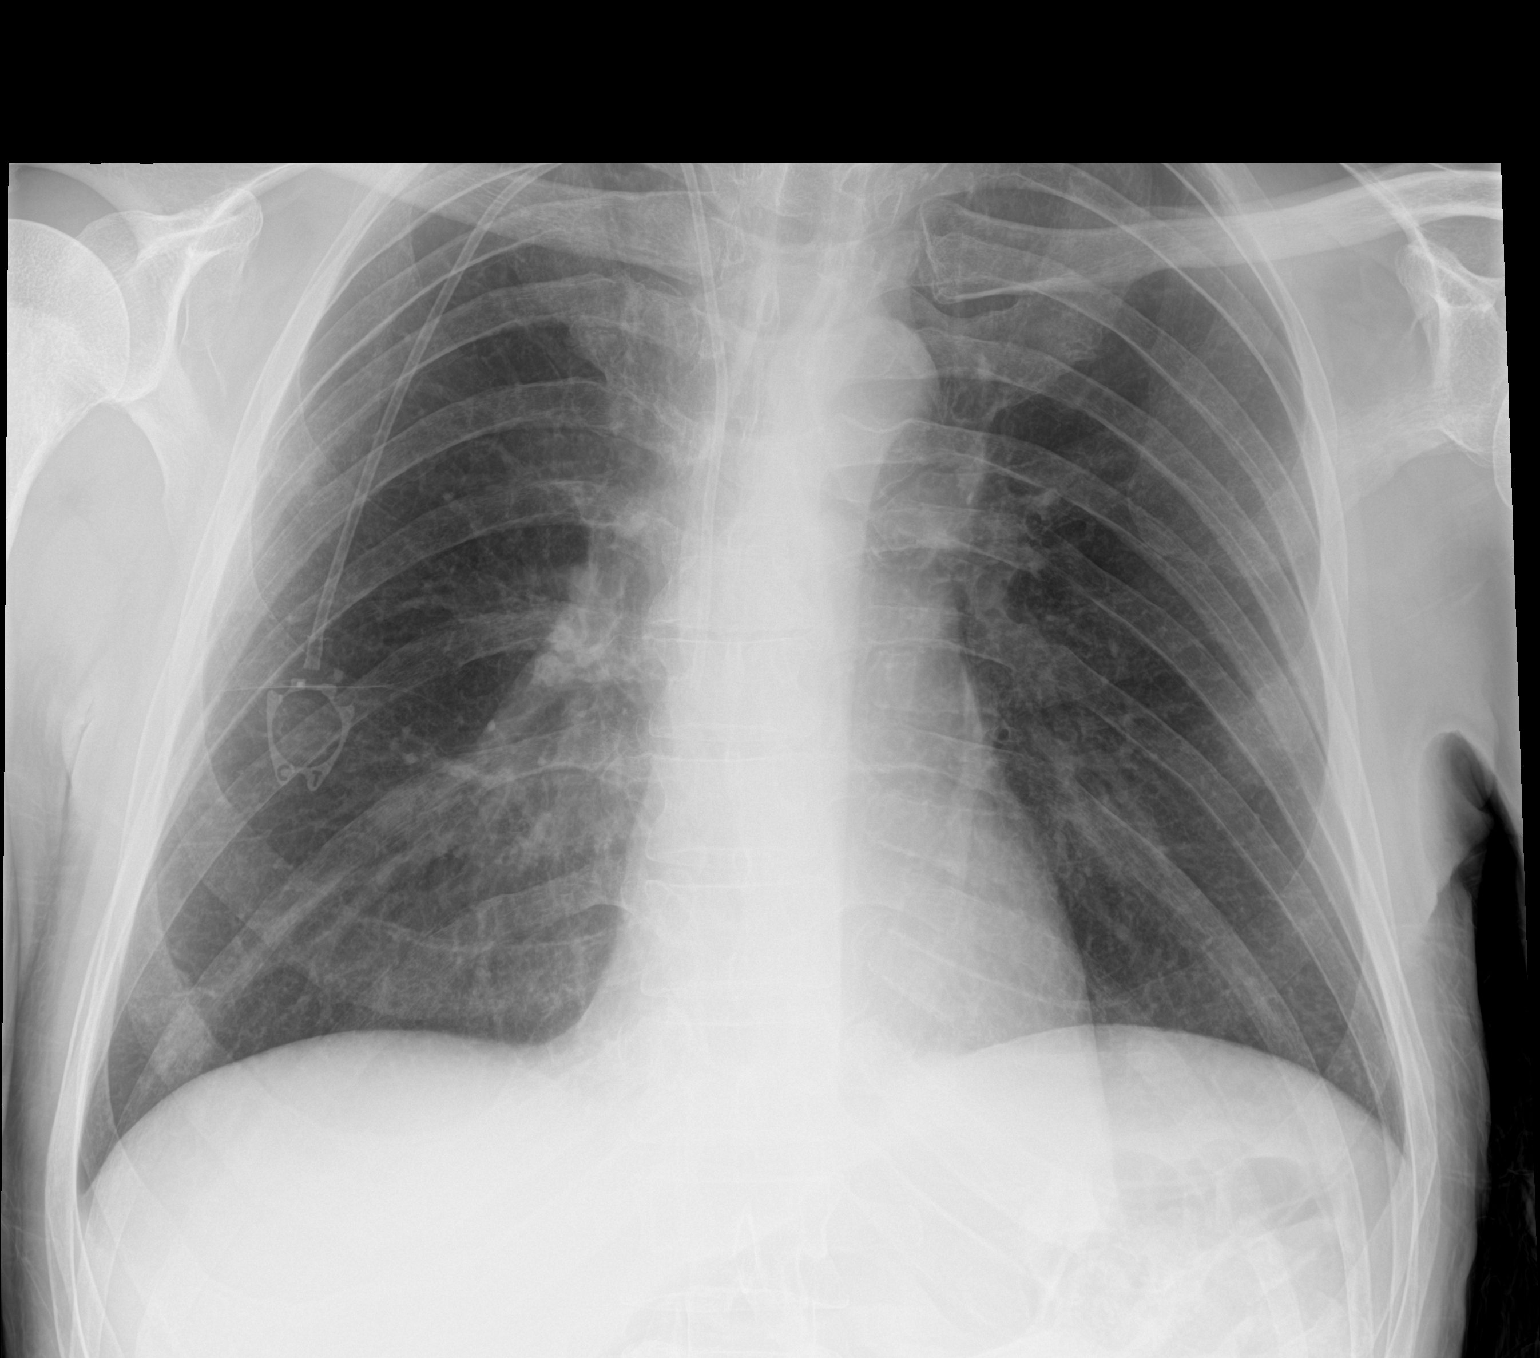

[1 of 1 positions shown; findings below may reference images not displayed]

FINDINGS: A right Port-A-Cath terminates in the central SVC. No pneumothorax.
The heart, hila, mediastinum are normal. No pulmonary nodules or
masses. No focal infiltrates.
IMPRESSION: No active disease.

## 2020-08-28 MED ORDER — ACETAMINOPHEN 650 MG RE SUPP: 650.0000 mg | Freq: Four times a day (QID) | RECTAL | Status: DC | PRN

## 2020-08-28 MED ORDER — SODIUM CHLORIDE 0.9 % IV SOLN
100.0000 mg | Freq: Every day | INTRAVENOUS | Status: DC
  Administered 2020-08-30: 100 mg via INTRAVENOUS
  Filled 2020-08-28: qty 20

## 2020-08-28 MED ORDER — ACETAMINOPHEN 325 MG PO TABS: 650.0000 mg | ORAL_TABLET | Freq: Four times a day (QID) | ORAL | Status: DC | PRN

## 2020-08-28 MED ORDER — ONDANSETRON HCL 4 MG/2ML IJ SOLN: 4.0000 mg | Freq: Four times a day (QID) | INTRAMUSCULAR | Status: DC | PRN

## 2020-08-28 MED ORDER — LACTATED RINGERS IV SOLN: INTRAVENOUS | Status: DC

## 2020-08-28 MED ORDER — SODIUM CHLORIDE 0.9 % IV SOLN
100.0000 mg | Freq: Once | INTRAVENOUS | Status: AC
  Administered 2020-08-29: 100 mg via INTRAVENOUS
  Filled 2020-08-28: qty 20

## 2020-08-28 NOTE — ED Triage Notes (Signed)
Patient c/o fever and cough x2 weeks. Last chemo x10 days ago.

## 2020-08-28 NOTE — ED Notes (Signed)
X-ray at bedside

## 2020-08-28 NOTE — H&P (Signed)
History and Physical    PLEASE NOTE THAT DRAGON DICTATION SOFTWARE WAS USED IN THE CONSTRUCTION OF THIS NOTE.   Albert Flowers:[id number] DOB: 1971/01/01 DOA: 08/28/2020  PCP: [name], MD Patient coming from: home   I have personally briefly reviewed patient's old medical records in [place]  Chief Complaint: Fever  HPI: Albert Flowers is a 50 y.o. 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   The following history is provided via my discussions with the patient as well as my discussions with his wife, who was present at patient's bedside, in addition to my discussions with the emergency department physician and via chart review.  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  Aside from the subjective fever, the patient reports no new acute symptoms relative to that which he typically in the weeks following chemotherapy.  He reports several months of nonproductive cough without any recent changes in terms of the intensity or frequency thereof.  Denies any associated shortness of breath or hemoptysis.  Not associate with any wheezing, no lower extremity erythema, calf tenderness, orthopnea, PND, or new edema in the lower extremities.  He reports that his 1 day of subjective fever has not been associated with any chills, full body rigors, or generalized myalgias.  Denies any recent headache, neck stiffness, rhinitis, rhinorrhea, sore throat, abdominal pain, diarrhea, or rash.  No known recent COVID-19 exposures, no recent traveling.  Denies any recent dysuria, gross hematuria, or  change in urinary urgency/frequency.  Denies any recent chest pain palpitations, diaphoresis, dizziness, presyncope, or syncope.  The patient reports nausea resulting in intermittent vomiting following most recent chemotherapy session on 08/16/2020.  He notes that the nausea and intermittent vomiting are slightly worse and more frequent relative to the following prior chemotherapy sessions, and notes a relative decline in his oral intake over the last 3 to 4 days on this basis.  In this context, the patient reports he was scheduled to present to the outpatient infusion center on the morning of 08/29/2020 for administration of IV fluids for perceived intravascular depletion given this recent increase in GI losses as well as decline in oral intake over the last several days.  Of note, he is prescribed Megace as an outpatient.  He notes generalized weakness over the last 3 to 4 days in the absence of any acute focal weakness, acute paresthesias, acute dysarthria, facial droop, acute change in vision, vertigo.   In terms of his history of squamous cell carcinoma of the tongue base with movement but he has a right-sided Port-A-Cath in place.  He conveys that he has an outpatient PET scan scheduled for 09/01/2020.  Per chart review, most recent CBC performed on the morning of the day of his last chemotherapy, 08/16/2020, was notable for the following: White blood cell count 2800, hemoglobin 9.2, platelets 122.  In the setting of 1 day of subjective fever, and in the absence of any interval administration of acetaminophen, the patient presents to [facility] emergency department for further evaluation.    ED Course:  Vital signs in the ED were notable for the following: temperature  max 100.1; heart rate 97 117, with most recent heart rates noted to be in the low 100s; respiratory rate 16-20; blood pressure 110/78 -129/79; oxygen saturation 99 to 100% on room air.  Labs were notable for the following: CMP was  notable for the following: Sodium 133 relative to most recent prior sodium data point of 136 on 08/16/2020, potassium 4.2, chloride 96, bicarbonate 28, BUN 12, creatinine 0.38, BUN ratio 32, glucose 153, albumin 3.1, but otherwise liver enzymes were found to be within normal limits.  CBC notable for white blood cell count of 1100 with ANC of 500, hemoglobin 7.4, platelets 42.  Urinalysis showed no white blood cells, nitrate negative, leukocyte Estrace negative.    Chest x-ray showed no evidence of acute cardiopulmonary process, including no evidence of infiltrate, edema, effusion, or pneumothorax.  Nasopharyngeal COVID-19 PCR performed in the emergency department today was found to be positive.  Influenza PCR performed in the ED was negative.  Blood cultures x2 were collected and urine culture was added on to sample provided in the ED today.  Socially, the patient was admitted to the med telemetry floor for further evaluation management of COVID-19 infection.    Review of Systems: As per HPI otherwise 10 point review of systems negative.   Past Medical History:  Diagnosis Date   Diabetes mellitus without complication [facility])     Past Surgical History:  Procedure Laterality Date   IR IMAGING GUIDED PORT INSERTION  07/15/2020   left neck dissection Left 05/04/2019   Left Neck Dissection by Dr. [name] at [facility].    neck sugery     as a child "had a knot removed from neck" not sure which side.    RIght neck dissection Right 06/16/2018   TORS and right neck dissection. Dr. [name] at [facility]    Social History:  reports that he has never smoked. He has never used smokeless tobacco. He reports current alcohol use. He reports that he does not use drugs.   Allergies  Allergen Reactions   Penicillins Other (See Comments)    Unknown reaction    Family history reviewed and not pertinent    Prior to Admission medications   Medication Sig Start Date End Date Taking? Authorizing Provider   dexamethasone (DECADRON) 4 MG tablet Take 2 tablets (8 mg total) by mouth daily. Start the day after carboplatin chemotherapy for 3 days. 07/12/20  Yes Benay Pike, MD  diphenhydrAMINE (BENADRYL) 25 MG tablet Take 25 mg by mouth at bedtime.   Yes [provider]  fluconazole (DIFLUCAN) 200 MG tablet Take 1 tablet (200 mg total) by mouth daily for 7 days. 08/25/20 09/01/20 Yes Benay Pike, MD  gabapentin (NEURONTIN) 300 MG capsule Take 900 mg by mouth 3 (three) times daily.   Yes [provider]  guaiFENesin (ROBITUSSIN) 100 MG/5ML SOLN Take 5 mLs (100 mg total) by mouth every 4 (four) hours as needed for cough or to loosen phlegm. 08/24/20  Yes Benay Pike, MD  HYDROcodone-acetaminophen (HYCET) 7.5-325 mg/15 ml solution Take 10 mLs by mouth 4 (four) times daily as needed for moderate pain. 08/23/20  Yes [name], [name], MD  lidocaine (XYLOCAINE) 2 % solution Use as directed 15 mLs in the mouth or throat every 8 (eight) hours as needed for mouth pain. 08/09/20  Yes Benay Pike, MD  lidocaine-prilocaine (EMLA) cream Apply to affected area once 07/12/20  Yes [name], [name], MD  megestrol (MEGACE ES) 625 MG/5ML suspension Take 5 mLs (625 mg total) by mouth  daily. 07/22/20  Yes Benay Pike, MD  mirtazapine (REMERON) 15 MG tablet Take 2 tablets (30 mg total) by mouth at bedtime. 07/19/20  Yes Benay Pike, MD  nystatin (MYCOSTATIN) 100000 UNIT/ML suspension Take 5 mLs (500,000 Units total) by mouth 4 (four) times daily. Swish and spit or swallow until better. 07/29/19  Yes Tanner, Lyndon Code., PA-C  ondansetron (ZOFRAN ODT) 8 MG disintegrating tablet Take 1 tablet (8 mg total) by mouth every 8 (eight) hours as needed for nausea or vomiting. 05/06/20  Yes [name], MD  omeprazole (PRILOSEC) 40 MG capsule TAKE 1 CAPSULE (40 MG TOTAL) BY MOUTH DAILY. Patient not taking: No sig reported 07/20/20   [name] E., PA-C  sodium fluoride (PREVIDENT 5000 PLUS) 1.1 % CREA dental cream  Patient to use as directed on a toothbrush or alternatively in his fluoride trays as instructed.  Patient is to spit out excess and not swallow.  Repeat nightly. Patient not taking: No sig reported 06/01/19   Lenn Cal, DDS  sucralfate (CARAFATE) 1 g tablet Dissolve 1 tablet in 10 mL H20 and swallow 20 min prior to meals and bedtime. Patient not taking: No sig reported 07/06/19   [name], MD     Objective    Physical Exam: Vitals:   08/28/20 2130 08/28/20 2200 08/28/20 2230 08/28/20 2300  BP: 102/76 114/83 113/80 105/68  Pulse: 98 98 97 (!) 102  Resp: _0 Temp:      TempSrc:      SpO2: 100% 99% 92% 99%    General: appears to be stated age; alert, oriented Skin: warm, dry, no rash Head:  AT/[place] Mouth:  Oral mucosa membranes appear dry, normal dentition Neck: supple; trachea midline Heart: Mildly tachycardic, but regular; did not appreciate any M/R/G Lungs: CTAB, did not appreciate any wheezes, rales, or rhonchi Abdomen: + BS; soft, ND, NT Vascular: 2+ pedal pulses b/l; 2+ radial pulses b/l Extremities: no peripheral edema, no muscle wasting Neuro: strength and sensation intact in upper and lower extremities b/l    Labs on Admission: I have personally reviewed following labs and imaging studies  CBC: Recent Labs  Lab 08/28/20 2046  WBC 1.1*  NEUTROABS 0.5*  HGB 7.4*  HCT 21.8*  MCV 96.9  PLT 42*   Basic Metabolic Panel: Recent Labs  Lab 08/28/20 2046  NA 133*  K 4.2  CL 96*  CO2 28  GLUCOSE 153*  BUN 12  CREATININE 0.38*  CALCIUM 8.9   GFR: Estimated Creatinine Clearance: 89.2 mL/min (A) (by C-G formula based on SCr of 0.38 mg/dL (L)). Liver Function Tests: Recent Labs  Lab 08/28/20 2046  AST 18  ALT 16  ALKPHOS 110  BILITOT 0.2*  PROT 6.8  ALBUMIN 3.1*   No results for input(s): LIPASE, AMYLASE in the last 168 hours. No results for input(s): AMMONIA in the last 168 hours. Coagulation Profile: No results for input(s):  INR, PROTIME in the last 168 hours. Cardiac Enzymes: No results for input(s): CKTOTAL, CKMB, CKMBINDEX, TROPONINI in the last 168 hours. BNP (last 3 results) No results for input(s): PROBNP in the last 8760 hours. HbA1C: No results for input(s): HGBA1C in the last 72 hours. CBG: No results for input(s): GLUCAP in the last 168 hours. Lipid Profile: No results for input(s): CHOL, HDL, LDLCALC, TRIG, CHOLHDL, LDLDIRECT in the last 72 hours. Thyroid Function Tests: No results for input(s): TSH, T4TOTAL, FREET4, T3FREE, THYROIDAB in the last 72 hours. Anemia Panel: No results  for input(s): VITAMINB12, FOLATE, FERRITIN, TIBC, IRON, RETICCTPCT in the last 72 hours. Urine analysis: No results found for: COLORURINE, APPEARANCEUR, [place], [place], GLUCOSEU, HGBUR, BILIRUBINUR, KETONESUR, PROTEINUR, UROBILINOGEN, NITRITE, LEUKOCYTESUR  Radiological Exams on Admission: DG Chest Portable 1 View  Result Date: 08/28/2020 CLINICAL DATA:  Chemotherapy for cancer.  Cough. EXAM: PORTABLE CHEST 1 VIEW COMPARISON:  None. FINDINGS: A right Port-A-Cath terminates in the central SVC. No pneumothorax. The heart, hila, mediastinum are normal. No pulmonary nodules or masses. No focal infiltrates. IMPRESSION: No active disease. Electronically Signed   By: Dorise Bullion III M.D   On: 08/28/2020 20:05     Assessment/Plan   Abdou Stocks is a 50 y.o. 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    Principal Problem:   COVID-19 virus infection Active Problems:   Sepsis ([place])   Pancytopenia (HCC)   Nausea & vomiting   Generalized weakness   Acute hyponatremia   Dehydration   Protein calorie malnutrition (HCC)   Diabetes mellitus without complication ([place])     #) COVID-19  infection: diagnosis on the basis of: 1 day of subjective fever with nasopharyngeal COVID-19 PCR performed in the ED today found to be positive. Of note, presentation does not appear to be associated with acute hypoxic respiratory distress/failure, with patient maintaining O2 sats greater than 94% on room air. Overall, it does not appear that criteria are met at the present time for patient's COVID-19 infection to be considered severe in nature. Consequently, there does not appear to be an indication at this time for initiation of dexamethasone per treatment guidance recommendations from [facility]'s Covid Treatment Guidelines.   Of note, in the setting of the patient's current cancer diagnosis, he meets criteria to be considered high risk for a more complicated clinical course of COVID-19 infection, including increased probability for progression of the severity associated with this infection. Therefore, in the setting of symptomatic COVID-19 infection requiring hospitalization for further evaluation and management thereof in this patient with the aforementioned high risk criteria who is felt to be early in the course of their infection given onset of symptoms starting less than 5 days ago, indications are met for initiation of remdesivir per treatment guidance recommendations from [facility]'s Covid Treatment Guidelines. Of note, ALT found to be less than 220. Therefore, there is no contraindication for initiation of remdesivir on the basis of transaminitis.   No known chronic underlying pulmonary pathology. Denies any known or suspected COVID-19 exposures. Will initiate daily linagliptin as DPP-4 inhibitors have been shown to reduce mortality in patients with DM2 and a COVID-19.  Of note, chest x-ray shows no evidence of acute cardiopulmonary process.  We will add on general inflammatory markers to previously collected labs in the ED, and monitor ensuing trend such with repeat inflammatory markers in the  morning.    Plan: Airborne and contact precautions. Monitor continuous pulse oximetry and monitor on telemetry. prn supplemental O2 to maintain O2 sats greater than or equal to 94%.  PRN albuterol inhaler. PRN acetaminophen for fever. Start remdesivir, as above. Refraining from initiation of dexamethasone, as above.  Add on inflammatory markers (fibrinogen, d dimer or fibrin derivatives, crp, ferritin, LDH) while repeating his labs in the morning, as further described above. Check serum magnesium and phosphorus levels. Check CMP and  CBC in the morning. Flutter valve and incentive spirometry. In setting of DM2, will start linagliptin 5 mg PO Qdaily for associated mortality benefit, as above.  Add on procalcitonin level.      #) Sepsis: Appears to be on the basis of COVID-19, as above, with SIRS criteria met via presenting leukopenia and tachycardia.  In the absence of associated evidence of endorgan damage, including a non-elevated initial LA, patient's sepsis does not meet criteria to be considered severe in nature at this time.  No evidence of associated hypotension thus far. In the absence of LA level greater than or equal to 4.0 and in the absence of hypotension, criteria are not met at this time for initiation of a 30 mL/kg IVF bolus. No evidence of additional underlying infectious process beyond COVID-19, including chest x-ray showing no evidence of suggest bacterial pneumonia this is not suggestive of UTI.  Furthermore, patient denies any recent diarrhea, abdominal pain, and demonstrates no evidence of acute transaminitis, as well as no meningismus, and no rash. As patient's sepsis is felt to be viral in nature, will refrain from antibiotic coverage at this time, while closely monitoring absolute neutrophil count and correlating with evaluation for development of fever in the setting of patient's history of malignancy on chemotherapy, as further detailed below..  Plan: Work-up and management of  COVID-19 infection, as above. Check blood cultures x2.  Repeat CBC with differential in the morning.  PRN acetaminophen for fever.  Check procalcitonin.       #) Pancytopenia: Relative to CBC performed on the morning of the patient's last chemotherapy session, demonstrating the following: White blood count 2800, hemoglobin 9.2, platelets 122, tonight's CBC demonstrates interval worsening of this pancytopenia, with a seedings white blood cell count noted to be 1100 with absolute neutrophil count of 500, hemoglobin 7.4, platelets 42.  Of note, dating back to April 2022, it appears that the patient's baseline hemoglobin has been in the range of 9-10.5.  No evidence of active bleed.  While patient complains of 1 day of subjective fever, presentation has not been associated with any objective fever, with a reported temperature max over that time of 100.7, while temperature max vital signs in the ED this evening has been noted to be 100.1.  In the setting of patient's squamous cell carcinoma of the tongue base with bony metastasis undergoing chemotherapy, will monitor closely for development of neutropenic fever.  At this point the patient is not objectively febrile, and does not possess an absolute neutrophil count of less than 500.  Therefore, particularly given confirmed COVID-19 infection, there does not appear at this time to be an overt indication for initiation of broad-spectrum antibiotics, although clinical picture warrants close monitoring of objective fever as well as as close monitoring of ensuing absolute neutrophil count, while correlating to the patient's overall clinical picture.  Plan: Work-up and management of presenting COVID-19 infection, as above.  Repeat CBC with differential in the morning.  Follow for results of blood cultures x2 collected this evening.  SCDs.  Refraining from pharmacologic DVT prophylaxis for now.      #) Generalized weakness: The patient reports 3 to 4 days of  generalized weakness in the absence of any acute focal neurologic deficits to suggest acute stroke.  Suspect that his generalized weakness is multifactorial in nature, with contributions from presenting sepsis due to COVID-19 infection, as further detailed above, in addition to interval worsening of his pancytopenia as well as clinical evidence of dehydration, as further  detailed below.  Of note, TSH was recently checked as an outpatient on 08/09/2020, and found to be within normal limits.  Plan: Further evaluation management of sepsis due to COVID 19 infection, as above, including initiation of remdesivir.  Repeat CBC and CMP in the morning.  Physical therapy consult has been placed.  Gentle IV fluids in the form of lactated Ringer's at 50 cc/h.       #) Nausea and vomiting: In the context of metastatic skeletal carcinoma of the tongue base receiving chemotherapy, the patient has been experiencing intermittent nausea resulting in nonbloody, nonbilious emesis, contributing to clinical evidence of dehydration, as further detailed below.  Plan: As needed Zofran ordered.  Gentle IV fluids, as above.  Add on serum magnesium level.  Repeat CMP in the morning.      #) Dehydration: In the setting of recent nausea/vomiting as well as corresponding decline in oral intake, there are physical exam and additional clinical markers for dehydration at the time of this evening's presentation, including the appearance of dry oral mucous membranes as well as labs reflecting acute prerenal azotemia.  Patient conveys that he was scheduled to present to the outpatient infusion center tomorrow morning for reception of IV fluids.  Given outpatient plan via his oncologist as well as above clinical markers.  Dehydration present at this time, will provide gentle IV fluids in the form of lactic Ringer's at 50 cc/h we will closely monitor ensuing volume status, particularly COVID-19 infection.  No evidence of associated  hypotension.  Plan: Lactated Ringer's at 50 cc/h, as above.  Monitor strict I's and O's and daily weights.  Repeat CMP in the morning.  Close monitoring of ensuing vital signs.      #) Acute hypo-osmolar hyponatremia: Presenting serum sodium noted to be 133 relative to most recent prior value of 136 on 08/16/2020. Suspect an element of hypovolumeia, with suspected contribution from dehydration in the setting of clinical evidence of such as well as report of recent decline in oral intake as well as nausea/vomiting as further detailed above, coinciding with the timeframe of decline of serum sodium levels.  Differential also includes the possibility of a contribution from SIADH in the context of acute pulmonary infection in the setting of positive COVID-19 diagnosis. in general, will provide gentle IV fluids to attend to suspected contribution from dehydration, while further evaluating for any additional contributing factors, including SIADH, as further detailed below.  TSH within normal limits when checked 2 weeks ago, as further detailed above.  No overt pharmacologic contributions. Of note, no evidence of associated acute focal neurologic deficits and no report of recent trauma.     Plan: monitor strict I's and O's and daily weights.  Check random urine sodium, urine osmolality.  Check serum osmolality to confirm suspected hypoosmolar etiology.  Repeat BMP in the morning. Gentle IVF's, as above.         #) Type 2 diabetes mellitus: Documented history of such, currently managed via lifestyle modifications in the absence of any oral hypoglycemic agents or exogenous insulin as an outpatient.  Presenting blood sugar per presenting CMP noted to be 153.  The setting of increased risk for ensuing development of hyperglycemia as a consequence gluconeogenesis resulting from physiologic stress of presenting sepsis due to COVID-19 infection, will monitor ensuing blood sugars 4 AM routine Accu-Cheks, as  further detailed below.  Plan: Accu-Cheks before every meal and at bedtime with very low-dose sliding scale insulin.        #)  Protein calorie malnutrition: In the setting of squamous cell carcinoma of the tongue base with bony metastasis, presenting BMI noted to be 18.  Patient already on an appetite stimulant in the form of Megace as an outpatient.  Plan: Prealbumin to further quantify the severity of his underlying protein calorie malnutrition      DVT prophylaxis: SCDs Code Status: Full code Family Communication: Patient's case was discussed with his wife, who was present at bedside Disposition Plan: Per Rounding Team Consults called: none  Admission status: Inpatient; med telemetry     Of note, this patient was added by me to the following Admit List/Treatment Team: [email or web address].      PLEASE NOTE THAT DRAGON DICTATION SOFTWARE WAS USED IN THE CONSTRUCTION OF THIS NOTE.   [place] [facility] Pager [phone] From [place]  Otherwise, please contact night-coverage  [email or web address] Password [facility]   08/28/2020, 11:23 PM

## 2020-08-28 NOTE — ED Notes (Signed)
CRITICAL RESULT Time: 2129 Test: WBC  Result: 1.1  MD notified: Tegeler, MD Action: Awaiting orders

## 2020-08-28 NOTE — ED Notes (Signed)
Pt request port be accessed but is requesting we wait until his visitor arrives with EMLA cream.

## 2020-08-28 NOTE — ED Provider Notes (Signed)
[name] DEPT Provider Note   CSN: [id number] Arrival date & time: 08/28/20  1833     History Chief Complaint  Patient presents with   Fever   Cough    Albert Flowers is a 50 y.o. male.  The history is provided by the patient, medical records and the spouse.  Fever Max temp prior to arrival:  100.7 Temp source:  Oral Severity:  Moderate Onset quality:  Gradual Duration:  1 day Timing:  Intermittent Progression:  Waxing and waning Chronicity:  New Relieved by:  Nothing Worsened by:  Nothing Ineffective treatments:  None tried Associated symptoms: chills and cough   Associated symptoms: no chest pain, no confusion, no congestion, no diarrhea, no dysuria, no headaches, no myalgias, no nausea, no rash, no rhinorrhea, no sore throat and no vomiting   Cough Cough characteristics:  Non-productive and productive Severity:  Moderate Onset quality:  Gradual Duration:  2 weeks Timing:  Constant Progression:  Waxing and waning Chronicity:  New Relieved by:  Nothing Worsened by:  Nothing Associated symptoms: chills and fever   Associated symptoms: no chest pain, no diaphoresis, no headaches, no myalgias, no rash, no rhinorrhea, no shortness of breath, no sore throat and no wheezing       Past Medical History:  Diagnosis Date   Diabetes mellitus without complication ([place])     Patient Active Problem List   Diagnosis Date Noted   Cough 08/24/2020   Cancer related pain 08/09/2020   Mucositis due to chemotherapy 07/27/2020   Intractable hiccoughs 07/27/2020   Weight loss, unintentional 07/27/2020   Anorexia 07/12/2020   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 [name] at [facility].    neck sugery     as a child "had a knot removed from neck" not sure which side.    RIght neck dissection Right 06/16/2018   TORS and right neck dissection. Dr. [name] at [facility]       No family history on file.  Social History   Tobacco Use   Smoking status: Never   Smokeless tobacco: Never  Vaping Use   Vaping Use: Never used  Substance Use Topics   Alcohol use: Yes    Comment: occasional   Drug use: Never    Home Medications Prior to Admission medications   Medication Sig Start Date End Date Taking? Authorizing Provider  chlorproMAZINE (THORAZINE) 10 MG tablet Take 1 tablet (10 mg total) by mouth 3 (three) times daily as needed for hiccoughs. 07/26/20   Benay Pike, MD  dexamethasone (DECADRON) 4 MG tablet Take 2 tablets (8 mg total) by mouth daily. Start the day after carboplatin chemotherapy for 3 days. 07/12/20   Benay Pike, MD  diphenhydrAMINE (BENADRYL) 25 MG tablet Take 25 mg by mouth at bedtime.    [provider]  fluconazole (DIFLUCAN) 200 MG tablet Take 1 tablet (200 mg total) by mouth daily for 7 days. 08/25/20 09/01/20  Benay Pike, MD  gabapentin (NEURONTIN) 300 MG capsule Take 900 mg by mouth 3 (three) times daily.    [provider]  guaiFENesin (ROBITUSSIN) 100 MG/5ML SOLN Take 5 mLs (100  mg total) by mouth every 4 (four) hours as needed for cough or to loosen phlegm. 08/24/20   Benay Pike, MD  HYDROcodone-acetaminophen (HYCET) 7.5-325 mg/15 ml solution Take 10 mLs by mouth 4 (four) times daily as needed for moderate pain. 08/23/20   Benay Pike, MD  lidocaine (XYLOCAINE) 2 % solution Use as directed 15 mLs in the mouth or throat every 8 (eight) hours as needed for mouth pain. 08/09/20   Benay Pike, MD  lidocaine-prilocaine (EMLA) cream Apply to affected area once 07/12/20   Benay Pike, MD  megestrol (MEGACE ES) 625 MG/5ML suspension Take 5 mLs  (625 mg total) by mouth daily. 07/22/20   Benay Pike, MD  methocarbamol (ROBAXIN) 750 MG tablet Take 1 tablet (750 mg total) by mouth every 6 (six) hours as needed for muscle spasms. 05/17/20   Mcarthur Rossetti, MD  mirtazapine (REMERON) 15 MG tablet Take 2 tablets (30 mg total) by mouth at bedtime. 07/19/20   Benay Pike, MD  nystatin (MYCOSTATIN) 100000 UNIT/ML suspension Take 5 mLs (500,000 Units total) by mouth 4 (four) times daily. Swish and spit or swallow until better. 07/29/19   Tanner, Lyndon Code., PA-C  omeprazole (PRILOSEC) 40 MG capsule TAKE 1 CAPSULE (40 MG TOTAL) BY MOUTH DAILY. 07/20/20   Tanner, Lyndon Code., PA-C  ondansetron (ZOFRAN ODT) 8 MG disintegrating tablet Take 1 tablet (8 mg total) by mouth every 8 (eight) hours as needed for nausea or vomiting. 05/06/20   [name], MD  sodium fluoride (PREVIDENT 5000 PLUS) 1.1 % CREA dental cream Patient to use as directed on a toothbrush or alternatively in his fluoride trays as instructed.  Patient is to spit out excess and not swallow.  Repeat nightly. Patient taking differently: Patient to use as directed on a toothbrush or alternatively in his fluoride trays as instructed.  Patient is to spit out excess and not swallow.  Repeat nightly. 06/01/19   Lenn Cal, DDS  sucralfate (CARAFATE) 1 g tablet Dissolve 1 tablet in 10 mL H20 and swallow 20 min prior to meals and bedtime. 07/06/19   [name], MD    Allergies    Penicillins  Review of Systems   Review of Systems  Constitutional:  Positive for chills, fatigue and fever. Negative for diaphoresis.  HENT:  Negative for congestion, rhinorrhea and sore throat.   Eyes:  Negative for visual disturbance.  Respiratory:  Positive for cough. Negative for chest tightness, shortness of breath and wheezing.   Cardiovascular:  Negative for chest pain, palpitations and leg swelling.  Gastrointestinal:  Negative for constipation, diarrhea, nausea and vomiting.  Genitourinary:  Negative  for dysuria, flank pain and frequency.  Musculoskeletal:  Negative for back pain and myalgias.  Skin:  Negative for rash.  Neurological:  Negative for light-headedness and headaches.  Psychiatric/Behavioral:  Negative for agitation and confusion.   All other systems reviewed and are negative.  Physical Exam Updated Vital Signs BP 116/73 (BP Location: Left Arm)   Pulse (!) 117   Temp 100.1 F (37.8 C) (Oral)   Resp 18   SpO2 100%   Physical Exam Vitals and nursing note reviewed.  Constitutional:      General: He is not in acute distress.    Appearance: He is well-developed. He is not ill-appearing, toxic-appearing or diaphoretic.  HENT:     Head: Normocephalic and atraumatic.     Nose: No congestion or rhinorrhea.  Eyes:     Conjunctiva/sclera: Conjunctivae normal.  Cardiovascular:  Rate and Rhythm: Regular rhythm. Tachycardia present.     Heart sounds: No murmur heard. Pulmonary:     Effort: Pulmonary effort is normal. No respiratory distress.     Breath sounds: Rhonchi present. No wheezing or rales.  Chest:     Chest wall: No tenderness.  Abdominal:     General: Abdomen is flat.     Palpations: Abdomen is soft.     Tenderness: There is no abdominal tenderness. There is no guarding or rebound.  Musculoskeletal:        General: No tenderness.     Cervical back: Neck supple. No tenderness.     Right lower leg: No edema.     Left lower leg: No edema.  Skin:    General: Skin is warm and dry.     Capillary Refill: Capillary refill takes less than 2 seconds.     Findings: No erythema.  Neurological:     General: No focal deficit present.     Mental Status: He is alert.  Psychiatric:        Mood and Affect: Mood normal.    ED Results / Procedures / Treatments   Labs (all labs ordered are listed, but only abnormal results are displayed) Labs Reviewed  RESP PANEL BY RT-PCR (FLU A&B, COVID) ARPGX2 - Abnormal; Notable for the following components:      Result Value    SARS Coronavirus 2 by RT PCR POSITIVE (*)    All other components within normal limits  CBC WITH DIFFERENTIAL/PLATELET - Abnormal; Notable for the following components:   WBC 1.1 (*)    RBC 2.25 (*)    Hemoglobin 7.4 (*)    HCT 21.8 (*)    Platelets 42 (*)    Neutro Abs 0.5 (*)    Lymphs Abs 0.2 (*)    Abs Immature Granulocytes 0.08 (*)    All other components within normal limits  COMPREHENSIVE METABOLIC PANEL - Abnormal; Notable for the following components:   Sodium 133 (*)    Chloride 96 (*)    Glucose, Bld 153 (*)    Creatinine, Ser 0.38 (*)    Albumin 3.1 (*)    Total Bilirubin 0.2 (*)    All other components within normal limits  CULTURE, BLOOD (ROUTINE X 2)  CULTURE, BLOOD (ROUTINE X 2)  URINE CULTURE  LACTIC ACID, PLASMA  LACTIC ACID, PLASMA  URINALYSIS, ROUTINE W REFLEX MICROSCOPIC  PATHOLOGIST SMEAR REVIEW  HIV ANTIBODY (ROUTINE TESTING W REFLEX)  PHOSPHORUS  MAGNESIUM  COMPREHENSIVE METABOLIC PANEL  CBC WITH DIFFERENTIAL/PLATELET  MAGNESIUM  C-REACTIVE PROTEIN  D-DIMER, QUANTITATIVE  FERRITIN  FIBRINOGEN  LACTATE DEHYDROGENASE  C-REACTIVE PROTEIN  D-DIMER, QUANTITATIVE  FERRITIN  FIBRINOGEN  LACTATE DEHYDROGENASE  PROCALCITONIN  SODIUM, URINE, RANDOM  OSMOLALITY, URINE  OSMOLALITY  PREALBUMIN    EKG None  Radiology DG Chest Portable 1 View  Result Date: 08/28/2020 CLINICAL DATA:  Chemotherapy for cancer.  Cough. EXAM: PORTABLE CHEST 1 VIEW COMPARISON:  None. FINDINGS: A right Port-A-Cath terminates in the central SVC. No pneumothorax. The heart, hila, mediastinum are normal. No pulmonary nodules or masses. No focal infiltrates. IMPRESSION: No active disease. Electronically Signed   By: Dorise Bullion III M.D   On: 08/28/2020 20:05    Procedures Procedures   CRITICAL CARE Performed by: Gwenyth Allegra Allard Lightsey Total critical care time: 35 minutes Critical care time was exclusive of separately billable procedures and treating other  patients. Critical care was necessary to treat or prevent imminent or  life-threatening deterioration. Critical care was time spent personally by me on the following activities: development of treatment plan with patient and/or surrogate as well as nursing, discussions with consultants, evaluation of patient's response to treatment, examination of patient, obtaining history from patient or surrogate, ordering and performing treatments and interventions, ordering and review of laboratory studies, ordering and review of radiographic studies, pulse oximetry and re-evaluation of patient's condition.   Medications Ordered in ED Medications  acetaminophen (TYLENOL) tablet 650 mg (has no administration in time range)    Or  acetaminophen (TYLENOL) suppository 650 mg (has no administration in time range)  lactated ringers infusion (has no administration in time range)  ondansetron (ZOFRAN) injection 4 mg (has no administration in time range)  remdesivir 100 mg in sodium chloride 0.9 % 100 mL IVPB (100 mg Intravenous New Bag/Given 08/29/20 0034)    Followed by  remdesivir 100 mg in sodium chloride 0.9 % 100 mL IVPB (has no administration in time range)    Followed by  remdesivir 100 mg in sodium chloride 0.9 % 100 mL IVPB (has no administration in time range)  diphenhydrAMINE (BENADRYL) capsule 25 mg (has no administration in time range)  fluconazole (DIFLUCAN) tablet 200 mg (has no administration in time range)  gabapentin (NEURONTIN) capsule 900 mg (has no administration in time range)  guaiFENesin (ROBITUSSIN) 100 MG/5ML solution 100 mg (has no administration in time range)  lidocaine (XYLOCAINE) 2 % viscous mouth solution 15 mL (has no administration in time range)  nystatin (MYCOSTATIN) 100000 UNIT/ML suspension 500,000 Units (has no administration in time range)  albuterol (VENTOLIN HFA) 108 (90 Base) MCG/ACT inhaler 1-2 puff (has no administration in time range)  sodium chloride flush (NS) 0.9 %  injection 10-40 mL (has no administration in time range)  sodium chloride flush (NS) 0.9 % injection 10-40 mL (has no administration in time range)  megestrol (MEGACE) 400 MG/10ML suspension 800 mg (has no administration in time range)  insulin aspart (novoLOG) injection 0-6 Units (has no administration in time range)    ED Course  I have reviewed the triage vital signs and the nursing notes.  Pertinent labs & imaging results that were available during my care of the patient were reviewed by me and considered in my medical decision making (see chart for details).    MDM Rules/Calculators/A&P                          Albert Flowers is a 50 y.o. 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  Patient reports that he has been having fevers and chills today and had a temperature of 100.7 at home.  He says that he has had cough around 2 weeks that is intermittently productive.  He reports feeling very tired and fatigued but denies any actual chest pain or shortness of breath constantly.  He denies nausea or vomiting but reports when he is having coughing fits he occasionally has some nausea.  Denies constipation, diarrhea, or urinary changes.  Last chemotherapy was reportedly 10 days ago.  Denies any trauma.  Denies other complaints.  Denies any worsening of his chronic neck pains.  On exam, lungs have some faint coarseness in the bases but otherwise relatively clear.  No significant wheezing or rales.  Chest and  back nontender.  Good pulses in extremities.  No lower extremity edema appreciated.  He denied history of DVT or PE and had no leg tenderness.  Good pulses in extremities.  Patient resting comfortably on room air.  Patient is very warm to the touch.  Patient's oral temperature was 100.1 however clinically  I suspect he is actually febrile.  Slightly tachycardic on monitor evaluation.  Clinically I am concerned about pneumonia given this constellation of symptoms in the setting of someone who is on chemotherapy.  We will get a swab to rule out COVID and influenza given the ongoing pandemic and predominance of flu recently but we will get x-ray and labs.  We will also get urinalysis and urine cultures given his immunocompromise status and the fevers.  He denies any headache or new neck pain so low suspicion for meningitis at this time.  Due to his symptoms, anticipate admission for further management when work-up is completed.      11:03 PM Patient's COVID test is positive.  Suspect this is the cause of his symptoms.  X-ray does not show pneumonia and his lactic acid was not elevated.  His CBC shows pancytopenia.  I do not feel safe with him going home but will discuss with medicine if they feel he needs antibiotics but he will need admission for neutropenic fever at home in the setting of new COVID-19 diagnosis with persistent tachypnea but normal oxygen saturations.   Final Clinical Impression(s) / ED Diagnoses Final diagnoses:  COVID-19  Clinical Impression: 1. COVID-19     Disposition: Admit  This note was prepared with assistance of Dragon voice recognition software. Occasional wrong-word or sound-a-like substitutions may have occurred due to the inherent limitations of voice recognition software.     Koltyn Kelsay, Gwenyth Allegra, MD 08/29/20 0110

## 2020-08-28 NOTE — ED Notes (Addendum)
Pt's spouse at bedside with cream for port access

## 2020-08-29 ENCOUNTER — Encounter (HOSPITAL_COMMUNITY): Payer: Self-pay | Admitting: Internal Medicine

## 2020-08-29 DIAGNOSIS — E871 Hypo-osmolality and hyponatremia: Secondary | ICD-10-CM | POA: Diagnosis present

## 2020-08-29 DIAGNOSIS — R531 Weakness: Secondary | ICD-10-CM

## 2020-08-29 DIAGNOSIS — E46 Unspecified protein-calorie malnutrition: Secondary | ICD-10-CM | POA: Diagnosis present

## 2020-08-29 DIAGNOSIS — E860 Dehydration: Secondary | ICD-10-CM | POA: Diagnosis present

## 2020-08-29 DIAGNOSIS — A419 Sepsis, unspecified organism: Secondary | ICD-10-CM | POA: Diagnosis present

## 2020-08-29 DIAGNOSIS — R112 Nausea with vomiting, unspecified: Secondary | ICD-10-CM | POA: Diagnosis present

## 2020-08-29 DIAGNOSIS — E119 Type 2 diabetes mellitus without complications: Secondary | ICD-10-CM

## 2020-08-29 DIAGNOSIS — D61818 Other pancytopenia: Secondary | ICD-10-CM | POA: Diagnosis present

## 2020-08-29 LAB — CBC WITH DIFFERENTIAL/PLATELET
Abs Immature Granulocytes: 0.02 10*3/uL (ref 0.00–0.07)
Basophils Absolute: 0 10*3/uL (ref 0.0–0.1)
Basophils Relative: 1 %
Eosinophils Absolute: 0 10*3/uL (ref 0.0–0.5)
Eosinophils Relative: 2 %
HCT: 20.5 % — ABNORMAL LOW (ref 39.0–52.0)
Hemoglobin: 7.1 g/dL — ABNORMAL LOW (ref 13.0–17.0)
Immature Granulocytes: 2 %
Lymphocytes Relative: 19 %
Lymphs Abs: 0.2 10*3/uL — ABNORMAL LOW (ref 0.7–4.0)
MCH: 33.8 pg (ref 26.0–34.0)
MCHC: 34.6 g/dL (ref 30.0–36.0)
MCV: 97.6 fL (ref 80.0–100.0)
Monocytes Absolute: 0.2 10*3/uL (ref 0.1–1.0)
Monocytes Relative: 26 %
Neutro Abs: 0.4 10*3/uL — CL (ref 1.7–7.7)
Neutrophils Relative %: 50 %
Platelets: 37 10*3/uL — ABNORMAL LOW (ref 150–400)
RBC: 2.1 MIL/uL — ABNORMAL LOW (ref 4.22–5.81)
RDW: 14.2 % (ref 11.5–15.5)
WBC: 0.9 10*3/uL — CL (ref 4.0–10.5)
nRBC: 0 % (ref 0.0–0.2)

## 2020-08-29 LAB — CBG MONITORING, ED
Glucose-Capillary: 102 mg/dL — ABNORMAL HIGH (ref 70–99)
Glucose-Capillary: 119 mg/dL — ABNORMAL HIGH (ref 70–99)

## 2020-08-29 LAB — ABO/RH: ABO/RH(D): O POS

## 2020-08-29 LAB — PREALBUMIN: Prealbumin: 8.3 mg/dL — ABNORMAL LOW (ref 18–38)

## 2020-08-29 LAB — PHOSPHORUS: Phosphorus: 3.3 mg/dL (ref 2.5–4.6)

## 2020-08-29 LAB — COMPREHENSIVE METABOLIC PANEL
ALT: 15 U/L (ref 0–44)
AST: 17 U/L (ref 15–41)
Albumin: 3 g/dL — ABNORMAL LOW (ref 3.5–5.0)
Alkaline Phosphatase: 105 U/L (ref 38–126)
Anion gap: 7 (ref 5–15)
BUN: 8 mg/dL (ref 6–20)
CO2: 27 mmol/L (ref 22–32)
Calcium: 8.7 mg/dL — ABNORMAL LOW (ref 8.9–10.3)
Chloride: 99 mmol/L (ref 98–111)
Creatinine, Ser: 0.44 mg/dL — ABNORMAL LOW (ref 0.61–1.24)
GFR, Estimated: >60 mL/min (ref >60)
Glucose, Bld: 120 mg/dL — ABNORMAL HIGH (ref 70–99)
Potassium: 3.7 mmol/L (ref 3.5–5.1)
Sodium: 133 mmol/L — ABNORMAL LOW (ref 135–145)
Total Bilirubin: 0.4 mg/dL (ref 0.3–1.2)
Total Protein: 6.5 g/dL (ref 6.5–8.1)

## 2020-08-29 LAB — OSMOLALITY: Osmolality: 279 mOsm/kg (ref 275–295)

## 2020-08-29 LAB — OSMOLALITY, URINE: Osmolality, Ur: 547 mOsm/kg (ref 300–900)

## 2020-08-29 LAB — FERRITIN: Ferritin: 907 ng/mL — ABNORMAL HIGH (ref 24–336)

## 2020-08-29 LAB — FIBRINOGEN: Fibrinogen: 554 mg/dL — ABNORMAL HIGH (ref 210–475)

## 2020-08-29 LAB — LACTATE DEHYDROGENASE: LDH: 85 U/L — ABNORMAL LOW (ref 98–192)

## 2020-08-29 LAB — GLUCOSE, CAPILLARY
Glucose-Capillary: 96 mg/dL (ref 70–99)
Glucose-Capillary: 199 mg/dL — ABNORMAL HIGH (ref 70–99)

## 2020-08-29 LAB — MAGNESIUM: Magnesium: 1.8 mg/dL (ref 1.7–2.4)

## 2020-08-29 LAB — PREPARE RBC (CROSSMATCH)

## 2020-08-29 LAB — C-REACTIVE PROTEIN: CRP: 14.8 mg/dL — ABNORMAL HIGH (ref <1.0)

## 2020-08-29 LAB — PROCALCITONIN: Procalcitonin: 0.16 ng/mL

## 2020-08-29 LAB — HIV ANTIBODY (ROUTINE TESTING W REFLEX): HIV Screen 4th Generation wRfx: NONREACTIVE

## 2020-08-29 LAB — D-DIMER, QUANTITATIVE: D-Dimer, Quant: 5.3 ug/mL-FEU — ABNORMAL HIGH (ref 0.00–0.50)

## 2020-08-29 LAB — SODIUM, URINE, RANDOM: Sodium, Ur: 85 mmol/L

## 2020-08-29 MED ORDER — SODIUM CHLORIDE 0.9% FLUSH
10.0000 mL | Freq: Two times a day (BID) | INTRAVENOUS | Status: DC
Start: 2020-08-29 — End: 2020-08-30
  Administered 2020-08-29 – 2020-08-30 (×4): 10 mL

## 2020-08-29 MED ORDER — DIPHENHYDRAMINE HCL 25 MG PO CAPS
25.0000 mg | ORAL_CAPSULE | Freq: Every evening | ORAL | Status: DC | PRN
Start: 2020-08-29 — End: 2020-08-30
  Administered 2020-08-29: 25 mg via ORAL
  Filled 2020-08-29: qty 1

## 2020-08-29 MED ORDER — POLYETHYLENE GLYCOL 3350 17 G PO PACK
17.0000 g | PACK | Freq: Every day | ORAL | Status: DC | PRN
  Administered 2020-08-29 – 2020-08-30 (×2): 17 g via ORAL
  Filled 2020-08-29 (×2): qty 1

## 2020-08-29 MED ORDER — NYSTATIN 100000 UNIT/ML MT SUSP
5.0000 mL | Freq: Four times a day (QID) | OROMUCOSAL | Status: DC
  Administered 2020-08-29 – 2020-08-30 (×4): 500000 [IU] via ORAL
  Filled 2020-08-29 (×5): qty 5

## 2020-08-29 MED ORDER — GUAIFENESIN 100 MG/5ML PO SOLN
5.0000 mL | ORAL | Status: DC | PRN
  Administered 2020-08-29 – 2020-08-30 (×2): 100 mg via ORAL
  Filled 2020-08-29 (×2): qty 10

## 2020-08-29 MED ORDER — MEGESTROL ACETATE 625 MG/5ML PO SUSP: 625.0000 mg | Freq: Every day | ORAL | Status: DC

## 2020-08-29 MED ORDER — LINAGLIPTIN 5 MG PO TABS
5.0000 mg | ORAL_TABLET | Freq: Every day | ORAL | Status: DC
  Administered 2020-08-29 – 2020-08-30 (×2): 5 mg via ORAL
  Filled 2020-08-29 (×2): qty 1

## 2020-08-29 MED ORDER — CHLORHEXIDINE GLUCONATE CLOTH 2 % EX PADS
6.0000 | MEDICATED_PAD | Freq: Every day | CUTANEOUS | Status: DC
  Administered 2020-08-29 – 2020-08-30 (×2): 6 via TOPICAL

## 2020-08-29 MED ORDER — SODIUM CHLORIDE 0.9% IV SOLUTION: Freq: Once | INTRAVENOUS | Status: AC

## 2020-08-29 MED ORDER — MEGESTROL ACETATE 400 MG/10ML PO SUSP
800.0000 mg | Freq: Every day | ORAL | Status: DC
  Administered 2020-08-29 – 2020-08-30 (×2): 800 mg via ORAL
  Filled 2020-08-29 (×2): qty 20

## 2020-08-29 MED ORDER — INSULIN ASPART 100 UNIT/ML IJ SOLN
0.0000 [IU] | Freq: Three times a day (TID) | INTRAMUSCULAR | Status: DC
  Filled 2020-08-29: qty 0.06

## 2020-08-29 MED ORDER — SODIUM CHLORIDE 0.9% FLUSH: 10.0000 mL | INTRAVENOUS | Status: DC | PRN

## 2020-08-29 MED ORDER — LIDOCAINE VISCOUS HCL 2 % MT SOLN
15.0000 mL | Freq: Three times a day (TID) | OROMUCOSAL | Status: DC | PRN
  Filled 2020-08-29: qty 15

## 2020-08-29 MED ORDER — GABAPENTIN 300 MG PO CAPS
900.0000 mg | ORAL_CAPSULE | Freq: Three times a day (TID) | ORAL | Status: DC
  Administered 2020-08-29 – 2020-08-30 (×4): 900 mg via ORAL
  Filled 2020-08-29 (×4): qty 3

## 2020-08-29 MED ORDER — MAGIC MOUTHWASH
10.0000 mL | Freq: Four times a day (QID) | ORAL | Status: DC | PRN
  Administered 2020-08-29: 10 mL via ORAL
  Filled 2020-08-29 (×3): qty 10

## 2020-08-29 MED ORDER — ALBUTEROL SULFATE HFA 108 (90 BASE) MCG/ACT IN AERS: 1.0000 | INHALATION_SPRAY | RESPIRATORY_TRACT | Status: DC | PRN

## 2020-08-29 MED ORDER — FLUCONAZOLE 100 MG PO TABS
200.0000 mg | ORAL_TABLET | Freq: Every day | ORAL | Status: DC
  Administered 2020-08-29 – 2020-08-30 (×2): 200 mg via ORAL
  Filled 2020-08-29: qty 2
  Filled 2020-08-29: qty 1

## 2020-08-29 NOTE — Plan of Care (Signed)
  Problem: Education: Goal: Knowledge of risk factors and measures for prevention of condition will improve Outcome: Progressing   Problem: Coping: Goal: Psychosocial and spiritual needs will be supported Outcome: Progressing   Problem: Respiratory: Goal: Will maintain a patent airway Outcome: Progressing Goal: Complications related to the disease process, condition or treatment will be avoided or minimized Outcome: Progressing   

## 2020-08-29 NOTE — ED Notes (Signed)
Patient was given his lunch tray.

## 2020-08-29 NOTE — Progress Notes (Signed)
Progress Note    Albert Flowers   CBU:[id number]  DOB: 1971-01-10  DOA: 08/28/2020     1  PCP: [name], MD  CC: fever  Hospital Course: Albert Flowers is a 50 yo male with PMH Albert Flowers, DMII who presented to the hospital with fever and generalized malaise/fatigue.  He denied any respiratory symptoms and has had a chronic cough even prior to starting some of his chemo.  His breathing and cough is unchanged and his wife states his cough is actually improved compared to normal. He had a low-grade fever and on presentation he underwent testing was found to be positive for COVID-19. His last chemo session was on 08/16/2020. He was started on remdesivir and admitted for further work-up and treatment.  Interval History:  Seen in the ER awaiting a room.  Still lethargic/tired.  His cough is noted but wife states is improved. Remains on room air.  Appetite still decreased but he was wanting to try some grits this morning.  ROS: Constitutional: positive for fatigue and malaise, negative for chills and fevers, Respiratory: negative for pneumonia and sputum, Cardiovascular: negative for chest pain, and Gastrointestinal: negative for abdominal pain  Assessment & Plan:  COVID-19 virus infection - Main symptoms include fever, malaise, lethargy.  No respiratory symptoms or hypoxia - Given immunocompromised state, patient started on remdesivir on admission. - Hold steroids unless develops hypoxia  Nausea and vomiting - Mixed etiology in context of underlying COVID and chemo use - Patient unable to keep up with nutritional intake.  Continue fluids - Advance diet as tolerated  Sepsis - presumed from viral infection; may have some confounding from state of dehydration contributing - treat covid and dehydration  Pancytopenia - Etiology due to underlying chemotherapy use - Continue neutropenic precautions, ANC less than 500 - Hemoglobin down to 7.1 g/dL this  morning.  Discussed with oncology, transfuse 1 unit PRBC  Hypovolemic hyponatremia - Continue fluids - BMP daily  DMII - SSI and CBG monitoring    Old records reviewed in assessment of this patient  Antimicrobials: Remdesivir 6/12 >> current  DVT prophylaxis: SCDs Start: 08/28/20 2321   Code Status:   Code Status: Full Code Family Communication: wife  Disposition Plan: Status is: Inpatient  Remains inpatient appropriate because:IV treatments appropriate due to intensity of illness or inability to take PO and Inpatient level of care appropriate due to severity of illness  Dispo: The patient is from: Home              Anticipated d/c is to: Home              Patient currently is not medically stable to d/c.   Difficult to place patient No  Risk of unplanned readmission score: Unplanned Admission- Pilot do not use: 17.92   Objective: Blood pressure 135/85, pulse 88, temperature 99.6 F (37.6 C), temperature source Oral, resp. rate 20, height 5\' 10"  (1.778 m), weight 57.7 kg, SpO2 100 %.  Examination: General appearance: alert, cooperative, and fatigued Head: Normocephalic, without obvious abnormality, atraumatic Eyes:  EOMI Lungs: clear to auscultation bilaterally Heart: regular rate and rhythm and S1, S2 normal Abdomen:  thin, soft, ND, BS present Extremities:  no edema Skin: mobility and turgor normal Neurologic: Grossly normal  Consultants:    Procedures:    Data Reviewed: I have personally reviewed following labs and imaging studies Results for orders placed or performed during the hospital encounter of 08/28/20 (from the past  24 hour(s))  CBC with Differential     Status: Abnormal   Collection Time: 08/28/20  8:46 PM  Result Value Ref Range   WBC 1.1 (LL) 4.0 - 10.5 K/uL   RBC 2.25 (L) 4.22 - 5.81 MIL/uL   Hemoglobin 7.4 (L) 13.0 - 17.0 g/dL   HCT 21.8 (L) 39.0 - 52.0 %   MCV 96.9 80.0 - 100.0 fL   MCH 32.9 26.0 - 34.0 pg   MCHC 33.9 30.0 - 36.0 g/dL    RDW 14.0 11.5 - 15.5 %   Platelets 42 (L) 150 - 400 K/uL   nRBC 0.0 0.0 - 0.2 %   Neutrophils Relative % 45 %   Neutro Abs 0.5 (L) 1.7 - 7.7 K/uL   Lymphocytes Relative 17 %   Lymphs Abs 0.2 (L) 0.7 - 4.0 K/uL   Monocytes Relative 28 %   Monocytes Absolute 0.3 0.1 - 1.0 K/uL   Eosinophils Relative 1 %   Eosinophils Absolute 0.0 0.0 - 0.5 K/uL   Basophils Relative 1 %   Basophils Absolute 0.0 0.0 - 0.1 K/uL   Immature Granulocytes 8 %   Abs Immature Granulocytes 0.08 (H) 0.00 - 0.07 K/uL   Reactive, Benign Lymphocytes PRESENT   Comprehensive metabolic panel     Status: Abnormal   Collection Time: 08/28/20  8:46 PM  Result Value Ref Range   Sodium 133 (L) 135 - 145 mmol/L   Potassium 4.2 3.5 - 5.1 mmol/L   Chloride 96 (L) 98 - 111 mmol/L   CO2 28 22 - 32 mmol/L   Glucose, Bld 153 (H) 70 - 99 mg/dL   BUN 12 6 - 20 mg/dL   Creatinine, Ser 0.38 (L) 0.61 - 1.24 mg/dL   Calcium 8.9 8.9 - 10.3 mg/dL   Total Protein 6.8 6.5 - 8.1 g/dL   Albumin 3.1 (L) 3.5 - 5.0 g/dL   AST 18 15 - 41 U/L   ALT 16 0 - 44 U/L   Alkaline Phosphatase 110 38 - 126 U/L   Total Bilirubin 0.2 (L) 0.3 - 1.2 mg/dL   GFR, Estimated >60 >60 mL/min   Anion gap 9 5 - 15  Lactic acid, plasma     Status: None   Collection Time: 08/28/20  8:46 PM  Result Value Ref Range   Lactic Acid, Venous 0.7 0.5 - 1.9 mmol/L  ABO/Rh     Status: None   Collection Time: 08/28/20  8:46 PM  Result Value Ref Range   ABO/RH(D)      O POS Performed at [facility], [place] [address], [place], [place] [address]   Resp Panel by RT-PCR (Flu A&B, Covid) Nasopharyngeal Swab     Status: Abnormal   Collection Time: 08/28/20  8:48 PM   Specimen: Nasopharyngeal Swab; Nasopharyngeal(NP) swabs in vial transport medium  Result Value Ref Range   SARS Coronavirus 2 by RT PCR POSITIVE (A) NEGATIVE   Influenza A by PCR NEGATIVE NEGATIVE   Influenza B by PCR NEGATIVE NEGATIVE  Urinalysis, Routine w reflex microscopic Urine,  Clean Catch     Status: None   Collection Time: 08/28/20 10:45 PM  Result Value Ref Range   Color, Urine YELLOW YELLOW   APPearance CLEAR CLEAR   Specific Gravity, Urine 1.014 1.005 - 1.030   pH 7.0 5.0 - 8.0   Glucose, UA NEGATIVE NEGATIVE mg/dL   Hgb urine dipstick NEGATIVE NEGATIVE   Bilirubin Urine NEGATIVE NEGATIVE   Ketones, ur NEGATIVE NEGATIVE  mg/dL   Protein, ur NEGATIVE NEGATIVE mg/dL   Nitrite NEGATIVE NEGATIVE   Leukocytes,Ua NEGATIVE NEGATIVE  Sodium, urine, random     Status: None   Collection Time: 08/28/20 10:45 PM  Result Value Ref Range   Sodium, Ur 85 mmol/L  Osmolality, urine     Status: None   Collection Time: 08/28/20 10:45 PM  Result Value Ref Range   Osmolality, Ur 547 300 - 900 mOsm/kg  Lactic acid, plasma     Status: None   Collection Time: 08/28/20 10:47 PM  Result Value Ref Range   Lactic Acid, Venous 0.6 0.5 - 1.9 mmol/L  C-reactive protein     Status: Abnormal   Collection Time: 08/29/20 12:12 AM  Result Value Ref Range   CRP 14.8 (H) <1.0 mg/dL  Ferritin     Status: Abnormal   Collection Time: 08/29/20 12:12 AM  Result Value Ref Range   Ferritin 907 (H) 24 - 336 ng/mL  Osmolality     Status: None   Collection Time: 08/29/20 12:12 AM  Result Value Ref Range   Osmolality 279 275 - 295 mOsm/kg  Prealbumin     Status: Abnormal   Collection Time: 08/29/20 12:12 AM  Result Value Ref Range   Prealbumin 8.3 (L) 18 - 38 mg/dL  Procalcitonin - Baseline     Status: None   Collection Time: 08/29/20  5:08 AM  Result Value Ref Range   Procalcitonin 0.16 ng/mL  HIV Antibody (routine testing w rflx)     Status: None   Collection Time: 08/29/20  5:10 AM  Result Value Ref Range   HIV Screen 4th Generation wRfx Non Reactive Non Reactive  Phosphorus     Status: None   Collection Time: 08/29/20  5:10 AM  Result Value Ref Range   Phosphorus 3.3 2.5 - 4.6 mg/dL  Magnesium     Status: None   Collection Time: 08/29/20  5:10 AM  Result Value Ref Range    Magnesium 1.8 1.7 - 2.4 mg/dL  Comprehensive metabolic panel     Status: Abnormal   Collection Time: 08/29/20  5:10 AM  Result Value Ref Range   Sodium 133 (L) 135 - 145 mmol/L   Potassium 3.7 3.5 - 5.1 mmol/L   Chloride 99 98 - 111 mmol/L   CO2 27 22 - 32 mmol/L   Glucose, Bld 120 (H) 70 - 99 mg/dL   BUN 8 6 - 20 mg/dL   Creatinine, Ser 0.44 (L) 0.61 - 1.24 mg/dL   Calcium 8.7 (L) 8.9 - 10.3 mg/dL   Total Protein 6.5 6.5 - 8.1 g/dL   Albumin 3.0 (L) 3.5 - 5.0 g/dL   AST 17 15 - 41 U/L   ALT 15 0 - 44 U/L   Alkaline Phosphatase 105 38 - 126 U/L   Total Bilirubin 0.4 0.3 - 1.2 mg/dL   GFR, Estimated >60 >60 mL/min   Anion gap 7 5 - 15  CBC with Differential/Platelet     Status: Abnormal   Collection Time: 08/29/20  5:10 AM  Result Value Ref Range   WBC 0.9 (LL) 4.0 - 10.5 K/uL   RBC 2.10 (L) 4.22 - 5.81 MIL/uL   Hemoglobin 7.1 (L) 13.0 - 17.0 g/dL   HCT 20.5 (L) 39.0 - 52.0 %   MCV 97.6 80.0 - 100.0 fL   MCH 33.8 26.0 - 34.0 pg   MCHC 34.6 30.0 - 36.0 g/dL   RDW 14.2 11.5 - 15.5 %   Platelets  37 (L) 150 - 400 K/uL   nRBC 0.0 0.0 - 0.2 %   Neutrophils Relative % 50 %   Neutro Abs 0.4 (LL) 1.7 - 7.7 K/uL   Lymphocytes Relative 19 %   Lymphs Abs 0.2 (L) 0.7 - 4.0 K/uL   Monocytes Relative 26 %   Monocytes Absolute 0.2 0.1 - 1.0 K/uL   Eosinophils Relative 2 %   Eosinophils Absolute 0.0 0.0 - 0.5 K/uL   Basophils Relative 1 %   Basophils Absolute 0.0 0.0 - 0.1 K/uL   Immature Granulocytes 2 %   Abs Immature Granulocytes 0.02 0.00 - 0.07 K/uL  D-dimer, quantitative     Status: Abnormal   Collection Time: 08/29/20  5:10 AM  Result Value Ref Range   D-Dimer, Quant 5.30 (H) 0.00 - 0.50 ug/mL-FEU  Fibrinogen     Status: Abnormal   Collection Time: 08/29/20  5:10 AM  Result Value Ref Range   Fibrinogen 554 (H) 210 - 475 mg/dL  Lactate dehydrogenase     Status: Abnormal   Collection Time: 08/29/20  5:10 AM  Result Value Ref Range   LDH 85 (L) 98 - 192 U/L  CBG monitoring,  ED     Status: Abnormal   Collection Time: 08/29/20  9:20 AM  Result Value Ref Range   Glucose-Capillary 102 (H) 70 - 99 mg/dL  Type and screen [place]     Status: None (Preliminary result)   Collection Time: 08/29/20 10:43 AM  Result Value Ref Range   ABO/RH(D) O POS    Antibody Screen NEG    Sample Expiration 09/01/2020,2359    Unit Number [id number]    Blood Component Type RED CELLS,LR    Unit division 00    Status of Unit ALLOCATED    Transfusion Status OK TO TRANSFUSE    Crossmatch Result      Compatible Performed at [facility], [place] [address], [place], [place] [address]   Prepare RBC (crossmatch)     Status: None   Collection Time: 08/29/20 10:43 AM  Result Value Ref Range   Order Confirmation      ORDER PROCESSED BY BLOOD BANK Performed at [facility], [place] [address] [place], [place] [address]   CBG monitoring, ED     Status: Abnormal   Collection Time: 08/29/20 12:17 PM  Result Value Ref Range   Glucose-Capillary 119 (H) 70 - 99 mg/dL    Recent Results (from the past 240 hour(s))  Resp Panel by RT-PCR (Flu A&B, Covid) Nasopharyngeal Swab     Status: Abnormal   Collection Time: 08/28/20  8:48 PM   Specimen: Nasopharyngeal Swab; Nasopharyngeal(NP) swabs in vial transport medium  Result Value Ref Range Status   SARS Coronavirus 2 by RT PCR POSITIVE (A) NEGATIVE Final    Comment: RESULT CALLED TO, READ BACK BY AND VERIFIED WITH: ALYSSA MALLARD AT 2233 ON 08/28/20 BY MAJ (NOTE) SARS-CoV-2 target nucleic acids are DETECTED.  The SARS-CoV-2 RNA is generally detectable in upper respiratory specimens during the acute phase of infection. Positive results are indicative of the presence of the identified virus, but do not rule out bacterial infection or co-infection with other pathogens not detected by the test. Clinical correlation with patient history and other diagnostic information is necessary to  determine patient infection status. The expected result is Negative.  Fact Sheet for Patients: EntrepreneurPulse.com.au  Fact Sheet for Healthcare Providers: IncredibleEmployment.be  This test is not yet approved or cleared  by the [place] and  has been authorized for detection and/or diagnosis of SARS-CoV-2 by FDA under an Emergency Use Authorization (EUA).  This EUA will remain in effect (meaning this tes t can be used) for the duration of  the COVID-19 declaration under Section 564(b)(1) of the Act, 21 U.S.C. section 360bbb-3(b)(1), unless the authorization is terminated or revoked sooner.     Influenza A by PCR NEGATIVE NEGATIVE Final   Influenza B by PCR NEGATIVE NEGATIVE Final    Comment: (NOTE) The Xpert Xpress SARS-CoV-2/FLU/RSV plus assay is intended as an aid in the diagnosis of influenza from Nasopharyngeal swab specimens and should not be used as a sole basis for treatment. Nasal washings and aspirates are unacceptable for Xpert Xpress SARS-CoV-2/FLU/RSV testing.  Fact Sheet for Patients: EntrepreneurPulse.com.au  Fact Sheet for Healthcare Providers: IncredibleEmployment.be  This test is not yet approved or cleared by the [place] FDA and has been authorized for detection and/or diagnosis of SARS-CoV-2 by FDA under an Emergency Use Authorization (EUA). This EUA will remain in effect (meaning this test can be used) for the duration of the COVID-19 declaration under Section 564(b)(1) of the Act, 21 U.S.C. section 360bbb-3(b)(1), unless the authorization is terminated or revoked.  Performed at [facility], [place] [address], [place], [place] [address]      Radiology Studies: DG Chest Portable 1 View  Result Date: 08/28/2020 CLINICAL DATA:  Chemotherapy for cancer.  Cough. EXAM: PORTABLE CHEST 1 VIEW COMPARISON:  None. FINDINGS: A right Port-A-Cath terminates  in the central SVC. No pneumothorax. The heart, hila, mediastinum are normal. No pulmonary nodules or masses. No focal infiltrates. IMPRESSION: No active disease. Electronically Signed   By: Dorise Bullion III M.D   On: 08/28/2020 20:05   DG Chest Portable 1 View  Final Result      Scheduled Meds:  sodium chloride   Intravenous Once   Chlorhexidine Gluconate Cloth  6 each Topical Daily   fluconazole  200 mg Oral Daily   gabapentin  900 mg Oral TID   insulin aspart  0-6 Units Subcutaneous TID WC   linagliptin  5 mg Oral Daily   megestrol  800 mg Oral Daily   nystatin  5 mL Oral QID   sodium chloride flush  10-40 mL Intracatheter Q12H   PRN Meds: acetaminophen **OR** acetaminophen, albuterol, diphenhydrAMINE, guaiFENesin, lidocaine, ondansetron (ZOFRAN) IV, sodium chloride flush Continuous Infusions:  lactated ringers 75 mL/hr at 08/29/20 0844   [START ON 08/30/2020] remdesivir 100 mg in NS 100 mL       LOS: 1 day  Time spent: Greater than 50% of the 35 minute visit was spent in counseling/coordination of care for the patient as laid out in the A&P.   Dwyane Dee, MD [facility] 08/29/2020, 2:27 PM

## 2020-08-29 NOTE — Evaluation (Signed)
Physical Therapy Evaluation Patient Details Name: Albert Flowers MRN: [id number] DOB: 1970-09-30 Today's Date: 08/29/2020   History of Present Illness  50 y.o. male who is admitted to [facility] on 08/28/2020 with COVID-19 infection after presenting from home to [facility] ED complaining of fever.  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  Clinical Impression  Pt ambulated in his room ~80' without an assistive device, no loss of balance, no pain. Pt does report he gets low back pain at site of spinal metastases with prolonged standing, and that he does at times use a cane for longer walks. He denies falls in the past 1 year, denied numbness/tingling in BLEs. He is mobilizing independently at present. As he is covid + and cannot ambulate in the halls, I encouraged him to walk in his room several times a day and instructed him in strengthening exercises (standing squats, marching in standing, ankle pumps, knee extension AROM) to minimize deconditioning during hospitalization. No further PT indicated. Will sign off.     Follow Up Recommendations No PT follow up    Equipment Recommendations  Other (comment) (recommended shower seat for energy conservation, pt to purchase this privately)    Recommendations for Other Services       Precautions / Restrictions Precautions Precautions: None Restrictions Weight Bearing Restrictions: No      Mobility  Bed Mobility Overal bed mobility: Independent                  Transfers Overall transfer level: Independent                  Ambulation/Gait Ambulation/Gait assistance: Independent Gait Distance (Feet): 80 Feet Assistive device: None Gait Pattern/deviations: WFL(Within Functional Limits) Gait velocity: WNL   General Gait Details: steady, no loss of balance, no dyspnea, SaO2 95% on room air  walking  Stairs            Wheelchair Mobility    Modified Rankin (Stroke Patients Only)       Balance Overall balance assessment: Independent                                           Pertinent Vitals/Pain Pain Assessment: No/denies pain    Home Living Family/patient expects to be discharged to:: Private residence Living Arrangements: Spouse/significant other Available Help at Discharge: Family Type of Home: House Home Access: Stairs to enter     Home Layout: Multi-level Home Equipment: Kasandra Knudsen - single point      Prior Function Level of Independence: Independent with assistive device(s)         Comments: independent ADLs, uses cane for longer walks, denies falls in past 1 year     Hand Dominance        Extremity/Trunk Assessment   Upper Extremity Assessment Upper Extremity Assessment: Overall WFL for tasks assessed    Lower Extremity Assessment Lower Extremity Assessment: Overall WFL for tasks assessed    Cervical / Trunk Assessment Cervical / Trunk Assessment: Normal  Communication   Communication: No difficulties  Cognition Arousal/Alertness: Awake/alert Behavior During Therapy: WFL for tasks assessed/performed Overall Cognitive Status: Within Functional Limits for tasks assessed  General Comments      Exercises     Assessment/Plan    PT Assessment Patent does not need any further PT services  PT Problem List         PT Treatment Interventions      PT Goals (Current goals can be found in the Care Plan section)  Acute Rehab PT Goals PT Goal Formulation: All assessment and education complete, DC therapy    Frequency     Barriers to discharge        Co-evaluation               AM-PAC PT "6 Clicks" Mobility  Outcome Measure Help needed turning from your back to your side while in a flat bed without using bedrails?: None Help needed moving from  lying on your back to sitting on the side of a flat bed without using bedrails?: None Help needed moving to and from a bed to a chair (including a wheelchair)?: None Help needed standing up from a chair using your arms (e.g., wheelchair or bedside chair)?: None Help needed to walk in hospital room?: None Help needed climbing 3-5 steps with a railing? : None 6 Click Score: 24    End of Session   Activity Tolerance: Patient tolerated treatment well Patient left: in bed;with call bell/phone within reach;with family/visitor present Nurse Communication: Mobility status      Time: 1448-1856 PT Time Calculation (min) (ACUTE ONLY): 20 min   Charges:   PT Evaluation $PT Eval Low Complexity: 1 Low         Philomena Doheny PT 08/29/2020  Acute Rehabilitation Services Pager [phone] Office [phone]

## 2020-08-30 ENCOUNTER — Other Ambulatory Visit: Payer: Managed Care, Other (non HMO)

## 2020-08-30 ENCOUNTER — Telehealth: Payer: Self-pay | Admitting: Hematology and Oncology

## 2020-08-30 ENCOUNTER — Other Ambulatory Visit: Payer: Self-pay | Admitting: Hematology and Oncology

## 2020-08-30 ENCOUNTER — Encounter: Payer: Managed Care, Other (non HMO) | Admitting: Nutrition

## 2020-08-30 ENCOUNTER — Ambulatory Visit: Payer: Managed Care, Other (non HMO)

## 2020-08-30 ENCOUNTER — Ambulatory Visit: Payer: Managed Care, Other (non HMO) | Admitting: Hematology and Oncology

## 2020-08-30 DIAGNOSIS — U071 COVID-19: Secondary | ICD-10-CM

## 2020-08-30 LAB — CBC WITH DIFFERENTIAL/PLATELET
Abs Immature Granulocytes: 0.09 10*3/uL — ABNORMAL HIGH (ref 0.00–0.07)
Basophils Absolute: 0 10*3/uL (ref 0.0–0.1)
Basophils Relative: 1 %
Eosinophils Absolute: 0 10*3/uL (ref 0.0–0.5)
Eosinophils Relative: 2 %
HCT: 25.2 % — ABNORMAL LOW (ref 39.0–52.0)
Hemoglobin: 8.5 g/dL — ABNORMAL LOW (ref 13.0–17.0)
Immature Granulocytes: 7 %
Lymphocytes Relative: 17 %
Lymphs Abs: 0.2 10*3/uL — ABNORMAL LOW (ref 0.7–4.0)
MCH: 32.3 pg (ref 26.0–34.0)
MCHC: 33.7 g/dL (ref 30.0–36.0)
MCV: 95.8 fL (ref 80.0–100.0)
Monocytes Absolute: 0.2 10*3/uL (ref 0.1–1.0)
Monocytes Relative: 15 %
Neutro Abs: 0.8 10*3/uL — ABNORMAL LOW (ref 1.7–7.7)
Neutrophils Relative %: 58 %
Platelets: 35 10*3/uL — ABNORMAL LOW (ref 150–400)
RBC: 2.63 MIL/uL — ABNORMAL LOW (ref 4.22–5.81)
RDW: 15.4 % (ref 11.5–15.5)
WBC: 1.4 10*3/uL — CL (ref 4.0–10.5)
nRBC: 0 % (ref 0.0–0.2)

## 2020-08-30 LAB — URINE CULTURE: Culture: NO GROWTH

## 2020-08-30 LAB — COMPREHENSIVE METABOLIC PANEL
ALT: 13 U/L (ref 0–44)
AST: 16 U/L (ref 15–41)
Albumin: 2.9 g/dL — ABNORMAL LOW (ref 3.5–5.0)
Alkaline Phosphatase: 96 U/L (ref 38–126)
Anion gap: 7 (ref 5–15)
BUN: 8 mg/dL (ref 6–20)
CO2: 28 mmol/L (ref 22–32)
Calcium: 9 mg/dL (ref 8.9–10.3)
Chloride: 99 mmol/L (ref 98–111)
Creatinine, Ser: 0.58 mg/dL — ABNORMAL LOW (ref 0.61–1.24)
GFR, Estimated: >60 mL/min (ref >60)
Glucose, Bld: 107 mg/dL — ABNORMAL HIGH (ref 70–99)
Potassium: 4 mmol/L (ref 3.5–5.1)
Sodium: 134 mmol/L — ABNORMAL LOW (ref 135–145)
Total Bilirubin: 0.3 mg/dL (ref 0.3–1.2)
Total Protein: 6.4 g/dL — ABNORMAL LOW (ref 6.5–8.1)

## 2020-08-30 LAB — BPAM RBC
Blood Product Expiration Date: 202207152359
ISSUE DATE / TIME: 202206131831
Unit Type and Rh: 5100

## 2020-08-30 LAB — TYPE AND SCREEN
ABO/RH(D): O POS
Antibody Screen: NEGATIVE
Unit division: 0

## 2020-08-30 LAB — LACTATE DEHYDROGENASE: LDH: 82 U/L — ABNORMAL LOW (ref 98–192)

## 2020-08-30 LAB — D-DIMER, QUANTITATIVE: D-Dimer, Quant: 6.73 ug/mL-FEU — ABNORMAL HIGH (ref 0.00–0.50)

## 2020-08-30 LAB — PATHOLOGIST SMEAR REVIEW

## 2020-08-30 LAB — GLUCOSE, CAPILLARY: Glucose-Capillary: 103 mg/dL — ABNORMAL HIGH (ref 70–99)

## 2020-08-30 LAB — C-REACTIVE PROTEIN: CRP: 12.8 mg/dL — ABNORMAL HIGH (ref <1.0)

## 2020-08-30 LAB — MAGNESIUM: Magnesium: 1.9 mg/dL (ref 1.7–2.4)

## 2020-08-30 MED ORDER — HEPARIN SOD (PORK) LOCK FLUSH 100 UNIT/ML IV SOLN
500.0000 [IU] | INTRAVENOUS | Status: DC | PRN
  Filled 2020-08-30: qty 5

## 2020-08-30 NOTE — Progress Notes (Signed)
I connected by phone with Albert Flowers on 08/30/2020 at 11:13 AM to discuss the potential use of a new treatment for mild to moderate COVID-19 viral infection in non-hospitalized patients.  This patient is a 50 y.o. male that meets the FDA criteria for Emergency Use Authorization of COVID monoclonal antibody bebtelovimab. Has a (+) direct SARS-CoV-2 viral test result Has mild or moderate COVID-19  Is NOT hospitalized due to COVID-19 Is within 10 days of symptom onset Has at least one of the high risk factor(s) for progression to severe COVID-19 and/or hospitalization as defined in EUA. Specific high risk criteria : Immunosuppressive Disease or Treatment   I have spoken and communicated the following to the patient or parent/caregiver regarding COVID monoclonal antibody treatment:  FDA has authorized the emergency use for the treatment of mild to moderate COVID-19 in adults and pediatric patients with positive results of direct SARS-CoV-2 viral testing who are 60 years of age and older weighing at least 40 kg, and who are at high risk for progressing to severe COVID-19 and/or hospitalization.  The significant known and potential risks and benefits of COVID monoclonal antibody, and the extent to which such potential risks and benefits are unknown.  Information on available alternative treatments and the risks and benefits of those alternatives, including clinical trials.  Patients treated with COVID monoclonal antibody should continue to self-isolate and use infection control measures (e.g., wear mask, isolate, social distance, avoid sharing personal items, clean and disinfect "high touch" surfaces, and frequent handwashing) according to CDC guidelines.   The patient or parent/caregiver has the option to accept or refuse COVID monoclonal antibody treatment.  Discussion about the monoclonal antibody infusion does not ensure treatment. The patient will be placed on a list and scheduled according  to risk, symptom onset and availability. A scheduler will reach to the patient to let them know if we can accommodate their infusion or not.  After reviewing this information with the patient, the patient has agreed to receive one of the available covid 19 monoclonal antibodies and will be provided an appropriate fact sheet prior to infusion.   Benay Pike, MD 08/30/2020 11:13 AM

## 2020-08-30 NOTE — Progress Notes (Signed)
Pt iv  de-accessed by IV TEAM.  AVS provided and reviewed with patient and spouse. All questions answered.   NT Manuela Schwartz assisted spouse to pack belongings.

## 2020-08-30 NOTE — Telephone Encounter (Signed)
Scheduled appointment per 06/14 sch msg. Left message.

## 2020-08-30 NOTE — Discharge Summary (Signed)
Albert Flowers WLN:[id number] DOB: [birth year] DOA: 08/28/2020  PCP: [name], MD  Admit date: 08/28/2020 Discharge date: 08/30/2020  Time spent: 35 minutes  Recommendations for Outpatient Follow-up:  Close oncology f/u, oncology office to investigate covid therapeutic options for patient  Consider test-based strategy to determine duration of isolation    Discharge Diagnoses:  Principal Problem:   COVID-19 virus infection Active Problems:   Sepsis ([place])   Pancytopenia ([place])   Nausea & vomiting   Generalized weakness   Acute hyponatremia   Dehydration   Protein calorie malnutrition ([place])   Diabetes mellitus without complication ([place])   Discharge Condition: fair  Diet recommendation: regular  Filed Weights   08/29/20 0519 08/30/20 0500  Weight: 57.7 kg 63.2 kg    History of present illness:  Albert Flowers is a 50 y.o. 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    The following history is provided via my discussions with the patient as well as my discussions with his wife, who was present at patient's bedside, in addition to my discussions with the emergency department physician and via chart review.   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  Aside from the subjective fever, the patient reports no new acute symptoms relative to that which he typically in the weeks following chemotherapy.  He reports several months of nonproductive cough without any recent changes in terms of the intensity or frequency thereof.  Denies any associated shortness of breath or hemoptysis.   Not associate with any wheezing, no lower extremity erythema, calf tenderness, orthopnea, PND, or new edema in the lower extremities.  He reports that his 1 day of subjective fever has not been associated with any chills, full body rigors, or generalized myalgias.  Denies any recent headache, neck stiffness, rhinitis, rhinorrhea, sore throat, abdominal pain, diarrhea, or rash.  No known recent COVID-19 exposures, no recent traveling.  Denies any recent dysuria, gross hematuria, or change in urinary urgency/frequency.  Denies any recent chest pain palpitations, diaphoresis, dizziness, presyncope, or syncope.   The patient reports nausea resulting in intermittent vomiting following most recent chemotherapy session on 08/16/2020.  He notes that the nausea and intermittent vomiting are slightly worse and more frequent relative to the following prior chemotherapy sessions, and notes a relative decline in his oral intake over the last 3 to 4 days on this basis.  In this context, the patient reports he was scheduled to present to the outpatient infusion center on the morning of 08/29/2020 for administration of IV fluids for perceived intravascular depletion given this recent increase in GI losses as well as decline in oral intake over the last several days.  Of note, he is prescribed Megace as an outpatient.   He notes generalized weakness over the last 3 to 4 days in the absence of any acute focal weakness, acute paresthesias, acute dysarthria, facial droop, acute change in vision, vertigo.    In terms of his history of squamous cell carcinoma of the tongue base with movement but he has a right-sided Port-A-Cath in place.  He conveys that he has an outpatient PET scan scheduled for 09/01/2020.  Per chart review, most recent CBC performed on the morning of the day of his last chemotherapy, 08/16/2020, was notable for the following: White blood cell count 2800, hemoglobin 9.2, platelets 122.   In the setting of 1 day of  subjective fever, and in the absence of any interval administration of acetaminophen, the patient presents to [facility] emergency department for further evaluation.  Hospital Course:  Hx metastatic SCC on active chemo here with fever, found to be covid positive. Tolerating fluids, no sig n/v/d. No shortness of breath or o2 requirement. Was admitted given immune compromise, was treated with remdesevir. Was transfused 1 unit for worsening chronic anemia (hgb 7.1, improved to 8.5). Case discussed with Dr. Driscilla Grammes (pt's oncologist). Will plan on discharge with close oncology f/u. Their office will contact the patient regarding covid therapeutic options (antibody infusion, possibly paxlovid, etc.). given immune compromise, consider test-based strategy for determining duration of isolation.  Procedures: none   Consultations: none  Discharge Exam: Vitals:   08/29/20 2151 08/30/20 0214  BP: 116/86 105/68  Pulse: 95 82  Resp: 18 14  Temp: 98.5 F (36.9 C) 98.4 F (36.9 C)  SpO2: 100% 100%    General: chronically ill appearing, NAD Cardiovascular: RRR Respiratory: ctab  Discharge Instructions   Discharge Instructions     Diet - low sodium heart healthy   Complete by: As directed    Increase activity slowly   Complete by: As directed       Allergies as of 08/30/2020       Reactions   Penicillins Other (See Comments)   Unknown reaction        Medication List     STOP taking these medications    omeprazole 40 MG capsule Commonly known as: PRILOSEC   sodium fluoride 1.1 % Crea dental cream Commonly known as: PreviDent 5000 Plus   sucralfate 1 g tablet Commonly known as: Carafate       TAKE these medications    dexamethasone 4 MG tablet Commonly known as: DECADRON Take 2 tablets (8 mg total) by mouth daily. Start the day after carboplatin chemotherapy for 3 days.   diphenhydrAMINE 25 MG tablet Commonly known as: BENADRYL Take 25 mg by mouth at bedtime.    fluconazole 200 MG tablet Commonly known as: DIFLUCAN Take 1 tablet (200 mg total) by mouth daily for 7 days.   gabapentin 300 MG capsule Commonly known as: NEURONTIN Take 900 mg by mouth 3 (three) times daily.   guaiFENesin 100 MG/5ML Soln Commonly known as: ROBITUSSIN Take 5 mLs (100 mg total) by mouth every 4 (four) hours as needed for cough or to loosen phlegm.   HYDROcodone-acetaminophen 7.5-325 mg/15 ml solution Commonly known as: HYCET Take 10 mLs by mouth 4 (four) times daily as needed for moderate pain.   lidocaine 2 % solution Commonly known as: XYLOCAINE Use as directed 15 mLs in the mouth or throat every 8 (eight) hours as needed for mouth pain.   lidocaine-prilocaine cream Commonly known as: EMLA Apply to affected area once   megestrol 625 MG/5ML suspension Commonly known as: MEGACE ES Take 5 mLs (625 mg total) by mouth daily.   mirtazapine 15 MG tablet Commonly known as: REMERON Take 2 tablets (30 mg total) by mouth at bedtime.   nystatin 100000 UNIT/ML suspension Commonly known as: MYCOSTATIN Take 5 mLs (500,000 Units total) by mouth 4 (four) times daily. Swish and spit or swallow until better.   ondansetron 8 MG disintegrating tablet Commonly known  as: Zofran ODT Take 1 tablet (8 mg total) by mouth every 8 (eight) hours as needed for nausea or vomiting.       Allergies  Allergen Reactions   Penicillins Other (See Comments)    Unknown reaction      The results of significant diagnostics from this hospitalization (including imaging, microbiology, ancillary and laboratory) are listed below for reference.    Significant Diagnostic Studies: DG Chest Portable 1 View  Result Date: 08/28/2020 CLINICAL DATA:  Chemotherapy for cancer.  Cough. EXAM: PORTABLE CHEST 1 VIEW COMPARISON:  None. FINDINGS: A right Port-A-Cath terminates in the central SVC. No pneumothorax. The heart, hila, mediastinum are normal. No pulmonary nodules or masses. No focal  infiltrates. IMPRESSION: No active disease. Electronically Signed   By: Dorise Bullion III M.D   On: 08/28/2020 20:05    Microbiology: Recent Results (from the past 240 hour(s))  Blood culture (routine x 2)     Status: None (Preliminary result)   Collection Time: 08/28/20  8:46 PM   Specimen: BLOOD  Result Value Ref Range Status   Specimen Description   Final    BLOOD LEFT ANTECUBITAL Performed at [facility], [place] [address] [place], [place] [address]    Special Requests   Final    BOTTLES DRAWN AEROBIC AND ANAEROBIC Blood Culture adequate volume Performed at [place] [address], [place], [place] [address]    Culture   Final    NO GROWTH 1 DAY Performed at [facility], [place] [address], [place], [place] [address]    Report Status PENDING  Incomplete  Resp Panel by RT-PCR (Flu A&B, Covid) Nasopharyngeal Swab     Status: Abnormal   Collection Time: 08/28/20  8:48 PM   Specimen: Nasopharyngeal Swab; Nasopharyngeal(NP) swabs in vial transport medium  Result Value Ref Range Status   SARS Coronavirus 2 by RT PCR POSITIVE (A) NEGATIVE Final    Comment: RESULT CALLED TO, READ BACK BY AND VERIFIED WITH: ALYSSA MALLARD AT 2233 ON 08/28/20 BY MAJ (NOTE) SARS-CoV-2 target nucleic acids are DETECTED.  The SARS-CoV-2 RNA is generally detectable in upper respiratory specimens during the acute phase of infection. Positive results are indicative of the presence of the identified virus, but do not rule out bacterial infection or co-infection with other pathogens not detected by the test. Clinical correlation with patient history and other diagnostic information is necessary to determine patient infection status. The expected result is Negative.  Fact Sheet for Patients: EntrepreneurPulse.com.au  Fact Sheet for Healthcare Providers: IncredibleEmployment.be  This test is not yet approved or cleared by  the [place] FDA and  has been authorized for detection and/or diagnosis of SARS-CoV-2 by FDA under an Emergency Use Authorization (EUA).  This EUA will remain in effect (meaning this tes t can be used) for the duration of  the COVID-19 declaration under Section 564(b)(1) of the Act, 21 U.S.C. section 360bbb-3(b)(1), unless the authorization is terminated or revoked sooner.     Influenza A by PCR NEGATIVE NEGATIVE Final   Influenza B by PCR NEGATIVE NEGATIVE Final    Comment: (NOTE) The Xpert Xpress SARS-CoV-2/FLU/RSV plus assay is intended as an aid in the diagnosis of influenza from Nasopharyngeal swab specimens and should not be used as a sole basis for treatment. Nasal washings and aspirates are unacceptable for Xpert Xpress SARS-CoV-2/FLU/RSV testing.  Fact Sheet for Patients: EntrepreneurPulse.com.au  Fact Sheet for Healthcare Providers: IncredibleEmployment.be  This test is not yet approved  or cleared by the [place] and has been authorized for detection and/or diagnosis of SARS-CoV-2 by FDA under an Emergency Use Authorization (EUA). This EUA will remain in effect (meaning this test can be used) for the duration of the COVID-19 declaration under Section 564(b)(1) of the Act, 21 U.S.C. section 360bbb-3(b)(1), unless the authorization is terminated or revoked.  Performed at [facility], [place] [address] [place], [place] [address]   Blood culture (routine x 2)     Status: None (Preliminary result)   Collection Time: 08/28/20  8:57 PM   Specimen: BLOOD  Result Value Ref Range Status   Specimen Description   Final    BLOOD RIGHT ANTECUBITAL Performed at [place] [address] [place], [place] [address]    Special Requests   Final    BOTTLES DRAWN AEROBIC AND ANAEROBIC Blood Culture adequate volume Performed at [place] [address] [place],  [place] [address]    Culture   Final    NO GROWTH 1 DAY Performed at [facility], [place] [address] [place], [place] [address]    Report Status PENDING  Incomplete  Urine culture     Status: None   Collection Time: 08/28/20 10:45 PM   Specimen: Urine, Random  Result Value Ref Range Status   Specimen Description   Final    URINE, RANDOM Performed at [place] [address], [place], [place] [address]    Special Requests   Final    URINE, CLEAN CATCH Performed at [facility], [place] [address], [place], [place] [address]    Culture   Final    NO GROWTH Performed at [facility], [place] [address], [place], [place] [address]    Report Status 08/30/2020 FINAL  Final     Labs: Basic Metabolic Panel: Recent Labs  Lab 08/28/20 2046 08/29/20 0510 08/30/20 0355  NA 133* 133* 134*  K 4.2 3.7 4.0  CL 96* 99 99  CO2 28 27 28   GLUCOSE 153* 120* 107*  BUN 12 8 8   CREATININE 0.38* 0.44* 0.58*  CALCIUM 8.9 8.7* 9.0  MG  --  1.8 1.9  PHOS  --  3.3  --    Liver Function Tests: Recent Labs  Lab 08/28/20 2046 08/29/20 0510 08/30/20 0355  AST 18 17 16   ALT 16 15 13   ALKPHOS 110 105 96  BILITOT 0.2* 0.4 0.3  PROT 6.8 6.5 6.4*  ALBUMIN 3.1* 3.0* 2.9*   No results for input(s): LIPASE, AMYLASE in the last 168 hours. No results for input(s): AMMONIA in the last 168 hours. CBC: Recent Labs  Lab 08/28/20 2046 08/29/20 0510 08/30/20 0355  WBC 1.1* 0.9* 1.4*  NEUTROABS 0.5* 0.4* 0.8*  HGB 7.4* 7.1* 8.5*  HCT 21.8* 20.5* 25.2*  MCV 96.9 97.6 95.8  PLT 42* 37* 35*   Cardiac Enzymes: No results for input(s): CKTOTAL, CKMB, CKMBINDEX, TROPONINI in the last 168 hours. BNP: BNP (last 3 results) No results for input(s): BNP in the last 8760 hours.  ProBNP (last 3 results) No results for input(s): PROBNP in the last 8760 hours.  CBG: Recent Labs  Lab 08/29/20 0920 08/29/20 1217 08/29/20 1720 08/29/20 2118 08/30/20 0741   GLUCAP 102* 119* 96 199* 103*       Signed:  Desma Maxim MD.  [facility] 08/30/2020, 10:50 AM

## 2020-08-31 ENCOUNTER — Telehealth (HOSPITAL_BASED_OUTPATIENT_CLINIC_OR_DEPARTMENT_OTHER): Payer: Managed Care, Other (non HMO) | Admitting: Hematology and Oncology

## 2020-08-31 ENCOUNTER — Ambulatory Visit (INDEPENDENT_AMBULATORY_CARE_PROVIDER_SITE_OTHER): Payer: Managed Care, Other (non HMO)

## 2020-08-31 ENCOUNTER — Encounter: Payer: Self-pay | Admitting: Hematology and Oncology

## 2020-08-31 DIAGNOSIS — R34 Anuria and oliguria: Secondary | ICD-10-CM | POA: Diagnosis not present

## 2020-08-31 DIAGNOSIS — C7951 Secondary malignant neoplasm of bone: Secondary | ICD-10-CM

## 2020-08-31 DIAGNOSIS — E119 Type 2 diabetes mellitus without complications: Secondary | ICD-10-CM | POA: Diagnosis not present

## 2020-08-31 DIAGNOSIS — K1231 Oral mucositis (ulcerative) due to antineoplastic therapy: Secondary | ICD-10-CM | POA: Diagnosis not present

## 2020-08-31 DIAGNOSIS — Z5111 Encounter for antineoplastic chemotherapy: Secondary | ICD-10-CM | POA: Diagnosis not present

## 2020-08-31 DIAGNOSIS — C01 Malignant neoplasm of base of tongue: Secondary | ICD-10-CM | POA: Diagnosis present

## 2020-08-31 DIAGNOSIS — K1230 Oral mucositis (ulcerative), unspecified: Secondary | ICD-10-CM | POA: Diagnosis not present

## 2020-08-31 DIAGNOSIS — U071 COVID-19: Secondary | ICD-10-CM | POA: Diagnosis not present

## 2020-08-31 DIAGNOSIS — Z79899 Other long term (current) drug therapy: Secondary | ICD-10-CM | POA: Diagnosis not present

## 2020-08-31 DIAGNOSIS — Z9221 Personal history of antineoplastic chemotherapy: Secondary | ICD-10-CM | POA: Diagnosis not present

## 2020-08-31 DIAGNOSIS — Z5112 Encounter for antineoplastic immunotherapy: Secondary | ICD-10-CM | POA: Diagnosis not present

## 2020-08-31 MED ORDER — DIPHENHYDRAMINE HCL 50 MG/ML IJ SOLN
50.0000 mg | Freq: Once | INTRAMUSCULAR | Status: AC | PRN
Start: 2020-08-31 — End: 2020-08-31

## 2020-08-31 MED ORDER — ALBUTEROL SULFATE HFA 108 (90 BASE) MCG/ACT IN AERS: 2.0000 | INHALATION_SPRAY | Freq: Once | RESPIRATORY_TRACT | Status: AC | PRN

## 2020-08-31 MED ORDER — FAMOTIDINE IN NACL 20-0.9 MG/50ML-% IV SOLN: 20.0000 mg | Freq: Once | INTRAVENOUS | Status: AC | PRN

## 2020-08-31 MED ORDER — SODIUM CHLORIDE 0.9 % IV SOLN
INTRAVENOUS | Status: DC | PRN
Start: 2020-08-31 — End: 2020-09-23

## 2020-08-31 MED ORDER — METHYLPREDNISOLONE SODIUM SUCC 125 MG IJ SOLR: 125.0000 mg | Freq: Once | INTRAMUSCULAR | Status: AC | PRN

## 2020-08-31 MED ORDER — EPINEPHRINE 0.3 MG/0.3ML IJ SOAJ: 0.3000 mg | Freq: Once | INTRAMUSCULAR | Status: AC | PRN

## 2020-08-31 MED ORDER — HEPARIN SOD (PORK) LOCK FLUSH 100 UNIT/ML IV SOLN
500.0000 [IU] | Freq: Once | INTRAVENOUS | Status: AC
  Administered 2020-08-31: 500 [IU] via INTRAVENOUS
  Filled 2020-08-31: qty 5

## 2020-08-31 MED ORDER — BEBTELOVIMAB 175 MG/2 ML IV (EUA)
175.0000 mg | Freq: Once | INTRAMUSCULAR | Status: AC
  Administered 2020-08-31: 175 mg via INTRAVENOUS

## 2020-08-31 NOTE — Assessment & Plan Note (Signed)
This is a very pleasant 50 year old male patient with metastatic HPV positive squamous cell carcinoma currently on treatment with 5-fluorouracil, carboplatin and Keytruda who is here for an interim visit after his recent hospitalization for COVID-19 infection.  He has been doing very well, no concerns for acute severe COVID illness.  He will also receive antibody tomorrow given high risk and immunocompromised status He had severe mucositis with both his cycles of chemotherapy despite dose reduction.  Hence we have plan to omit 5-fluorouracil from his future treatment cycles.  He does agree to this plan, he understands that there might be some loss of benefit from omitting 5-fluorouracil but given how severe the mucositis was, he would rather proceed with carboplatin and Keytruda alone.  Repeat imaging scheduled on 6/29.  Based on the response, we will make further recommendations.  Okay to proceed with that treatment on 6/27 if labs and visit appeared to be satisfactory.

## 2020-08-31 NOTE — Progress Notes (Signed)
Diagnosis: COVID  Provider:  Marshell Garfinkel, MD  Procedure: Infusion  IV Type: Port a Cath, IV Location: R Chest  Bebtelovimab, Dose: 175 mg  Infusion Start Time: 4199  Infusion Stop Time: 1444  Post Infusion IV Care: Observation period completed and Port a Cath Deaccessed/Flushed  Discharge: Condition: Good, Destination: Home . AVS provided to patient.   Performed by:  Koren Shiver, RN

## 2020-08-31 NOTE — Patient Instructions (Signed)
10 Things You Can Do to Manage Your COVID-19 Symptoms at Home If you have possible or confirmed COVID-19 Stay home except to get medical care. Monitor your symptoms carefully. If your symptoms get worse, call your healthcare provider immediately. Get rest and stay hydrated. If you have a medical appointment, call the healthcare provider ahead of time and tell them that you have or may have COVID-19. For medical emergencies, call 911 and notify the dispatch personnel that you have or may have COVID-19. Cover your cough and sneezes with a tissue or use the inside of your elbow. Wash your hands often with soap and water for at least 20 seconds or clean your hands with an alcohol-based hand sanitizer that contains at least 60% alcohol. As much as possible, stay in a specific room and away from other people in your home. Also, you should use a separate bathroom, if available. If you need to be around other people in or outside of the home, wear a mask. Avoid sharing personal items with other people in your household, like dishes, towels, and bedding. Clean all surfaces that are touched often, like counters, tabletops, and doorknobs. Use household cleaning sprays or wipes according to the label instructions. [email or web address] 10/02/2019 This information is not intended to replace advice given to you by your health care provider. Make sure you discuss any questions you have with your healthcare provider. Document Revised: 04/22/2020 Document Reviewed: 04/22/2020 Elsevier Patient Education  [place].  What types of side effects do monoclonal antibody drugs cause?  Common side effects  In general, the more common side effects caused by monoclonal antibody drugs include: Allergic reactions, such as hives or itching Flu-like signs and symptoms, including chills, fatigue, fever, and muscle aches and pains Nausea, vomiting Diarrhea Skin rashes Low blood pressure   The CDC is recommending  patients who receive monoclonal antibody treatments wait at least 90 days before being vaccinated.  Currently, there are no data on the safety and efficacy of mRNA COVID-19 vaccines in persons who received monoclonal antibodies or convalescent plasma as part of COVID-19 treatment. Based on the estimated half-life of such therapies as well as evidence suggesting that reinfection is uncommon in the 90 days after initial infection, vaccination should be deferred for at least 90 days, as a precautionary measure until additional information becomes available, to avoid interference of the antibody treatment with vaccine-induced immune responses.

## 2020-08-31 NOTE — Progress Notes (Signed)
[place] NOTE  Patient Care Team: [name], MD as PCP - General (Family Medicine) [name], MD as Consulting Physician (Otolaryngology) [name], MD as Attending Physician (Radiation Oncology) Leota Sauers, RN (Inactive) as Registered Nurse (Oncology) Wynelle Beckmann, Melodie Bouillon, PT as Physical Therapist (Physical Therapy) Sharen Counter, CCC-SLP as Speech Language Pathologist (Speech Pathology) Karie Mainland, RD as Dietitian (Nutrition) Malmfelt, Stephani Police, RN as Oncology Nurse Navigator (Oncology)  CHIEF COMPLAINTS/PURPOSE OF CONSULTATION:   Follow up after recent hospitalization for COVID  ASSESSMENT & PLAN:   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  He has been doing very well, no concerns for acute severe COVID illness.  He will also receive antibody tomorrow given high risk and immunocompromised status He had severe mucositis with both his cycles of chemotherapy despite dose reduction.  Hence we have plan to omit 5-fluorouracil from his future treatment cycles.  He does agree to this plan, he understands that there might be some loss of benefit from omitting 5-fluorouracil but given how severe the mucositis was, he would rather proceed with carboplatin and Keytruda alone.  Repeat imaging scheduled on 6/29.  Based on the response, we will make further recommendations.  Okay to proceed with that treatment on 6/27 if labs and visit appeared to be satisfactory.  Mucositis due to chemotherapy Improving Continue liquid hydrocodone as needed He is able to eat some food now.   COVID-19 virus infection No concern for severe infection Will proceed with antibody infusion given his  immunocompromised status.  No orders of the defined types were placed in this encounter.   HISTORY OF PRESENTING ILLNESS:   Albert Flowers 50 y.o. male is here because of metastatic HPV positive BOT cancer  Oncology History Overview Note   In summary, patient presents with Dr. Constance Holster of ENT in late 03/2018 for evaluation of an enlarging right neck mass.  CT neck showed necrotic right Level II and III necrotic LN's, possibly from oropharyngeal oral cavity primary, but the study was limited due to streak artifacts.  He underwent FNA of the R Level II LN, which showed squamous cell carcinoma, p16+.  PET in 05/2018 showed asymmetric FDG uptake in the right tonsil/base of the tongue, likely the primary malignancy.  In addition, there were two FDG-avid R Level II LN's without evidence of contralateral cervical LN or metastatic disease.  Given the relatively low volume disease, Dr Maylon Peppers recommended upfront surgery, followed by adjuvant therapy based on the final pathology. He had TORS on 06/16/2018.  Pathology from the procedure showed: invasive p16-positive (HPV-related) squamous cell carcinoma, 1.4 cm, negative margins. Out of 24 biopsied lymph nodes, only one was positive for metastatic carcinoma (1/24).  This node was 4.5cm but with no extracapsular extension.  No LVSI and no PNI.  He underwent right neck dissection but not left neck dissection.  At postop follow up, they agreed to proceed with observation and reserve radiation therapy for any possible future recurrence.   At follow up on 04/07/2019, Dr. [name] noted a new suspicious left upper neck lymph node. Neck CT performed that day revealed: new heterogeneous 2.2 cm contralateral left level 2a lymph node suspicious for contralateral nodal metastasis; indeterminate mild interval increase in size of several left level  2a and 2b lymph nodes.   PET scan performed on 04/16/2019 showed: single hypermetabolic left cervical lymph node; mild asymmetric FDG  uptake in left glossotonsillar sulcus; focal FDG uptake in L1 vertebral body without CT correlate; otherwise, no malignant-range FDG uptake elsewhere.  He underwent left tonsillectomy and left neck dissection on 05/04/2019 with pathology revealing: benign left tonsil. Out of a total of 25 biopsied lymph nodes, only one was positive for metastatic carcinoma but negative for extracapsular extension and positive for p16.  He was advised adjuvant radiation at this time. He completed radiation on 07/24/2019. He was seen for FU by Dr Isidore Moos in December when he was doing well. He then started complaining of back pain in February. This led to MRI and PET imaging.  04/26/2020 MRI showed extensive metastatic disease throughout the lumbar spine as well as the sacrum and left iliac bone. No pathologic fracture or epidural Tumor.  05/04/2020 PET CT showed widespread hypermetabolic bone mets. 3.5 cm necrotic hypermetabolic met lesion in right liver. Hypermetabolic uptake in right hilum associated with 2 hypermetabolic lung nodules. Patchy/nodular ground-glass opacity in the right lung having a tree-in-bud configuration. This would be an atypical appearance for metastatic disease and infectious etiology is favored, potentially Atypical.  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 Right level 2 necrotic nodal mass and level 3 necrotic lymph node are highly concerning for nodal metastasis from head and neck primary neoplasm likely oropharynx or the oral cavity. However, evaluation for primary neoplasm in these regions is markedly limited due to streak artifacts from dental amalgam and closed airway possibly secondary to patient's holding breath/swallowing. Recommend direct visual inspection and possibly PET CT scan for further evaluation.   2. Mild asymmetry and fullness of the right nasopharyngeal region, nonspecific.  3. 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    05/07/2018 Procedure   FNA of the R IJ Level II LN     05/07/2018 Pathology Results   ACCESSION NUMBER: [id number]  Right IJ chain lymph node, FNA Squamous cell carcinoma, metastatic; p16+     05/28/2018 Imaging   PET: IMPRESSION: 1. Asymmetric hypermetabolic activity in the RIGHT lingular tonsil/base of tongue region favored primary carcinoma. 2. Two hypermetabolic RIGHT level II metastatic lymph nodes. 3. No LEFT cervical lymphadenopathy.  No distant metastatic disease.    Malignant neoplasm of base of tongue ([place])  04/24/2018 Imaging   CT neck w/ contrast: 1. Right level 2 necrotic nodal mass and level 3 necrotic lymph node are highly concerning for nodal metastasis from head and neck primary neoplasm likely oropharynx or the oral cavity. However, evaluation for primary neoplasm in these regions is markedly limited due to streak artifacts from dental amalgam and closed airway possibly secondary to patient's holding breath/swallowing. Recommend direct visual inspection and possibly PET CT scan for further evaluation.  2. Mild asymmetry and fullness of the right nasopharyngeal region, nonspecific.  3. 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    05/07/2018 Procedure   FNA of the right cervical LN    05/07/2018 Pathology Results   ACCESSION NUMBER: [id number]  Right IJ chain lymph node, FNA Squamous cell carcinoma, metastatic; p16+     05/28/2018 Imaging   PET: IMPRESSION: 1. Asymmetric hypermetabolic activity in the RIGHT lingular tonsil/base of  tongue region favored primary carcinoma. 2. Two hypermetabolic RIGHT level II metastatic lymph nodes. 3. No LEFT cervical lymphadenopathy.  No distant metastatic disease.    06/02/2018 Initial Diagnosis   Malignant neoplasm of base of tongue ([place])    06/02/2018 Cancer  Staging   Staging form: Pharynx - HPV-Mediated Oropharynx, AJCC 8th Edition - Clinical: Stage I (cT2, cN1, cM0, p16+) - Signed by [name], MD on 06/02/2018    05/29/2019 Cancer Staging   Staging form: Pharynx - HPV-Mediated Oropharynx, AJCC 8th Edition - Pathologic stage from 05/29/2019: Stage I (rpT0, pN1, cM0, p16+) - Signed by [name], MD on 05/29/2019    06/27/2020 - 06/27/2020 Chemotherapy          07/18/2020 -  Chemotherapy    Patient is on Treatment Plan: HEAD/NECK PEMBROLIZUMAB + CARBOPLATIN + 5FU Q21D X 6 CYCLES / PEMBROLIZUMAB Q21D       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  He was admitted with COVID-19 infection when he presented to the ER with fever.  He denies any worsening fevers, chest pain or shortness of breath.  According to wife mucositis better, he is able to eat some food this morning.  No nausea or vomiting or diarrhea.  He has not been taking much pain medicine currently.  He is a little anxious about the antibody that he is going to get for COVID-19.  His scans and appointments have been scheduled according to the protocol given COVID-19 precautions.  Rest of the pertinent review of systems reviewed and unremarkable.  MEDICAL HISTORY:  Past Medical History:  Diagnosis Date   Diabetes mellitus without complication ([place])    Malignant neoplasm of base of tongue ([place]) 06/02/2018    SURGICAL HISTORY: Past Surgical History:  Procedure Laterality Date   IR IMAGING GUIDED PORT INSERTION  07/15/2020   left neck dissection Left 05/04/2019   Left Neck Dissection by Dr. [name] at [facility].    neck sugery     as a child "had a knot removed from neck" not sure which side.     RIght neck dissection Right 06/16/2018   TORS and right neck dissection. Dr. [name] at [facility]    SOCIAL HISTORY: Social History   Socioeconomic History   Marital status: Married    Spouse name: Not on file   Number of children: 4   Years of education: Not on file   Highest education level: Not on file  Occupational History   Not on file  Tobacco Use   Smoking status: Never   Smokeless tobacco: Never  Vaping Use   Vaping Use: Never used  Substance and Sexual Activity   Alcohol use: Yes    Comment: occasional   Drug use: Never   Sexual activity: Not on file  Other Topics Concern   Not on file  Social History Narrative   Not on file   Social Determinants of Health   Financial Resource Strain: Not on file  Food Insecurity: Not on file  Transportation Needs: Not on file  Physical Activity: Not on file  Stress: Not on file  Social Connections: Not on file  Intimate Partner Violence:  Not on file    FAMILY HISTORY: No family history on file.  ALLERGIES:  is allergic to penicillins.  MEDICATIONS:  Current Outpatient Medications  Medication Sig Dispense Refill   dexamethasone (DECADRON) 4 MG tablet Take 2 tablets (8 mg total) by mouth daily. Start the day after carboplatin chemotherapy for 3 days. 30 tablet 1   diphenhydrAMINE (BENADRYL) 25 MG tablet Take 25 mg by mouth at bedtime.     fluconazole (DIFLUCAN) 200 MG tablet Take 1 tablet (200 mg total) by mouth daily for 7 days. 7 tablet 0   gabapentin (NEURONTIN) 300 MG capsule Take 900 mg by mouth 3 (three) times daily.     guaiFENesin (ROBITUSSIN) 100 MG/5ML SOLN Take 5 mLs (100 mg total) by mouth every 4 (four) hours as needed for cough or to loosen phlegm. 236 mL 0   HYDROcodone-acetaminophen (HYCET) 7.5-325 mg/15 ml solution Take 10 mLs by mouth 4 (four) times daily as needed for moderate pain. 473 mL 0   lidocaine (XYLOCAINE) 2 % solution Use as directed 15 mLs in the mouth or throat every 8 (eight) hours as  needed for mouth pain. 300 mL 0   lidocaine-prilocaine (EMLA) cream Apply to affected area once 30 g 3   megestrol (MEGACE ES) 625 MG/5ML suspension Take 5 mLs (625 mg total) by mouth daily. 150 mL 0   mirtazapine (REMERON) 15 MG tablet Take 2 tablets (30 mg total) by mouth at bedtime. 60 tablet 0   nystatin (MYCOSTATIN) 100000 UNIT/ML suspension Take 5 mLs (500,000 Units total) by mouth 4 (four) times daily. Swish and spit or swallow until better. 240 mL 0   ondansetron (ZOFRAN ODT) 8 MG disintegrating tablet Take 1 tablet (8 mg total) by mouth every 8 (eight) hours as needed for nausea or vomiting. 30 tablet 3   No current facility-administered medications for this visit.   Facility-Administered Medications Ordered in Other Visits  Medication Dose Route Frequency Provider Last Rate Last Admin   sodium chloride flush (NS) 0.9 % injection 10 mL  10 mL Intracatheter PRN Khoen Genet, [name], MD   10 mL at 07/22/20 1638     PHYSICAL EXAMINATION: ECOG PERFORMANCE STATUS: 2 - Symptomatic, <50% confined to bed  There were no vitals filed for this visit.  There were no vitals filed for this visit.  PE and VS not done, tele visit He looks well today. No distress.  LABORATORY DATA:  I have reviewed the data as listed Lab Results  Component Value Date   WBC 1.4 (LL) 08/30/2020   HGB 8.5 (L) 08/30/2020   HCT 25.2 (L) 08/30/2020   MCV 95.8 08/30/2020   PLT 35 (L) 08/30/2020     Chemistry      Component Value Date/Time   NA 134 (L) 08/30/2020 0355   K 4.0 08/30/2020 0355   CL 99 08/30/2020 0355   CO2 28 08/30/2020 0355   BUN 8 08/30/2020 0355   CREATININE 0.58 (L) 08/30/2020 0355   CREATININE 0.70 08/16/2020 1129      Component Value Date/Time   CALCIUM 9.0 08/30/2020 0355   ALKPHOS 96 08/30/2020 0355   AST 16 08/30/2020 0355   AST 23 08/16/2020 1129   ALT 13 08/30/2020 0355   ALT 12 08/16/2020 1129   BILITOT 0.3 08/30/2020 0355   BILITOT 0.3 08/16/2020 1129      RADIOGRAPHIC  STUDIES: I have personally reviewed the radiological images as listed and agreed with the findings in the report.  DG Chest  Portable 1 View  Result Date: 08/28/2020 CLINICAL DATA:  Chemotherapy for cancer.  Cough. EXAM: PORTABLE CHEST 1 VIEW COMPARISON:  None. FINDINGS: A right Port-A-Cath terminates in the central SVC. No pneumothorax. The heart, hila, mediastinum are normal. No pulmonary nodules or masses. No focal infiltrates. IMPRESSION: No active disease. Electronically Signed   By: Dorise Bullion III M.D   On: 08/28/2020 20:05    All questions were answered. The patient knows to call the clinic with any problems, questions or concerns.     Benay Pike, MD 08/31/2020 1:22 PM

## 2020-08-31 NOTE — Assessment & Plan Note (Signed)
Improving Continue liquid hydrocodone as needed He is able to eat some food now.

## 2020-08-31 NOTE — Assessment & Plan Note (Signed)
No concern for severe infection Will proceed with antibody infusion given his immunocompromised status.

## 2020-09-01 ENCOUNTER — Ambulatory Visit (HOSPITAL_COMMUNITY): Payer: Managed Care, Other (non HMO)

## 2020-09-03 LAB — CULTURE, BLOOD (ROUTINE X 2)
Culture: NO GROWTH
Culture: NO GROWTH
Special Requests: ADEQUATE
Special Requests: ADEQUATE

## 2020-09-05 ENCOUNTER — Telehealth: Payer: Self-pay | Admitting: Hematology and Oncology

## 2020-09-05 NOTE — Telephone Encounter (Signed)
Rescheduled 06/27 appointments per 06/17 staff and scheduled messages, patient has been called and notified of scheduling changes.

## 2020-09-06 ENCOUNTER — Other Ambulatory Visit: Payer: Managed Care, Other (non HMO)

## 2020-09-06 ENCOUNTER — Encounter: Payer: Managed Care, Other (non HMO) | Admitting: Nutrition

## 2020-09-06 ENCOUNTER — Ambulatory Visit: Payer: Managed Care, Other (non HMO) | Admitting: Hematology and Oncology

## 2020-09-06 ENCOUNTER — Inpatient Hospital Stay: Payer: Managed Care, Other (non HMO)

## 2020-09-12 ENCOUNTER — Inpatient Hospital Stay: Payer: Managed Care, Other (non HMO)

## 2020-09-12 ENCOUNTER — Other Ambulatory Visit: Payer: Self-pay

## 2020-09-12 ENCOUNTER — Ambulatory Visit: Payer: Managed Care, Other (non HMO)

## 2020-09-12 ENCOUNTER — Ambulatory Visit: Payer: Managed Care, Other (non HMO) | Admitting: Nurse Practitioner

## 2020-09-12 ENCOUNTER — Encounter: Payer: Self-pay | Admitting: Nutrition

## 2020-09-12 ENCOUNTER — Other Ambulatory Visit: Payer: Managed Care, Other (non HMO)

## 2020-09-12 ENCOUNTER — Encounter: Payer: Managed Care, Other (non HMO) | Admitting: Nutrition

## 2020-09-12 ENCOUNTER — Inpatient Hospital Stay (HOSPITAL_BASED_OUTPATIENT_CLINIC_OR_DEPARTMENT_OTHER): Payer: Managed Care, Other (non HMO) | Admitting: Adult Health

## 2020-09-12 VITALS — BP 107/68 | HR 93 | Temp 98.3°F | Resp 18 | Wt 145.5 lb

## 2020-09-12 DIAGNOSIS — C01 Malignant neoplasm of base of tongue: Secondary | ICD-10-CM | POA: Diagnosis not present

## 2020-09-12 DIAGNOSIS — C090 Malignant neoplasm of tonsillar fossa: Secondary | ICD-10-CM

## 2020-09-12 LAB — CMP (CANCER CENTER ONLY)
ALT: 12 U/L (ref 0–44)
AST: 15 U/L (ref 15–41)
Albumin: 2.5 g/dL — ABNORMAL LOW (ref 3.5–5.0)
Alkaline Phosphatase: 119 U/L (ref 38–126)
Anion gap: 9 (ref 5–15)
BUN: 11 mg/dL (ref 6–20)
CO2: 25 mmol/L (ref 22–32)
Calcium: 8.6 mg/dL — ABNORMAL LOW (ref 8.9–10.3)
Chloride: 102 mmol/L (ref 98–111)
Creatinine: 0.67 mg/dL (ref 0.61–1.24)
GFR, Estimated: >60 mL/min (ref >60)
Glucose, Bld: 224 mg/dL — ABNORMAL HIGH (ref 70–99)
Potassium: 4.4 mmol/L (ref 3.5–5.1)
Sodium: 136 mmol/L (ref 135–145)
Total Bilirubin: <0.2 mg/dL — ABNORMAL LOW (ref 0.3–1.2)
Total Protein: 6.5 g/dL (ref 6.5–8.1)

## 2020-09-12 LAB — TSH: TSH: 1.117 u[IU]/mL (ref 0.320–4.118)

## 2020-09-12 LAB — CBC WITH DIFFERENTIAL (CANCER CENTER ONLY)
Abs Immature Granulocytes: 0.33 10*3/uL — ABNORMAL HIGH (ref 0.00–0.07)
Basophils Absolute: 0 10*3/uL (ref 0.0–0.1)
Basophils Relative: 1 %
Eosinophils Absolute: 0 10*3/uL (ref 0.0–0.5)
Eosinophils Relative: 0 %
HCT: 22.8 % — ABNORMAL LOW (ref 39.0–52.0)
Hemoglobin: 7.7 g/dL — ABNORMAL LOW (ref 13.0–17.0)
Immature Granulocytes: 7 %
Lymphocytes Relative: 8 %
Lymphs Abs: 0.4 10*3/uL — ABNORMAL LOW (ref 0.7–4.0)
MCH: 32.6 pg (ref 26.0–34.0)
MCHC: 33.8 g/dL (ref 30.0–36.0)
MCV: 96.6 fL (ref 80.0–100.0)
Monocytes Absolute: 0.7 10*3/uL (ref 0.1–1.0)
Monocytes Relative: 14 %
Neutro Abs: 3.4 10*3/uL (ref 1.7–7.7)
Neutrophils Relative %: 70 %
Platelet Count: 130 10*3/uL — ABNORMAL LOW (ref 150–400)
RBC: 2.36 MIL/uL — ABNORMAL LOW (ref 4.22–5.81)
RDW: 15.7 % — ABNORMAL HIGH (ref 11.5–15.5)
WBC Count: 4.9 10*3/uL (ref 4.0–10.5)
nRBC: 0.4 % — ABNORMAL HIGH (ref 0.0–0.2)

## 2020-09-12 MED ORDER — PALONOSETRON HCL INJECTION 0.25 MG/5ML
0.2500 mg | Freq: Once | INTRAVENOUS | Status: AC
Start: 2020-09-12 — End: 2020-09-12
  Administered 2020-09-12: 0.25 mg via INTRAVENOUS

## 2020-09-12 MED ORDER — SODIUM CHLORIDE 0.9 % IV SOLN
200.0000 mg | Freq: Once | INTRAVENOUS | Status: AC
  Administered 2020-09-12: 200 mg via INTRAVENOUS
  Filled 2020-09-12: qty 8

## 2020-09-12 MED ORDER — SODIUM CHLORIDE 0.9 % IV SOLN
Freq: Once | INTRAVENOUS | Status: AC
  Filled 2020-09-12: qty 250

## 2020-09-12 MED ORDER — HEPARIN SOD (PORK) LOCK FLUSH 100 UNIT/ML IV SOLN
500.0000 [IU] | Freq: Once | INTRAVENOUS | Status: AC | PRN
Start: 2020-09-12 — End: 2020-09-12
  Administered 2020-09-12: 500 [IU]
  Filled 2020-09-12: qty 5

## 2020-09-12 MED ORDER — SODIUM CHLORIDE 0.9 % IV SOLN
10.0000 mg | Freq: Once | INTRAVENOUS | Status: AC
  Administered 2020-09-12: 10 mg via INTRAVENOUS
  Filled 2020-09-12: qty 10

## 2020-09-12 MED ORDER — PALONOSETRON HCL INJECTION 0.25 MG/5ML
INTRAVENOUS | Status: AC
  Filled 2020-09-12: qty 5

## 2020-09-12 MED ORDER — SODIUM CHLORIDE 0.9 % IV SOLN
150.0000 mg | Freq: Once | INTRAVENOUS | Status: AC
  Administered 2020-09-12: 150 mg via INTRAVENOUS
  Filled 2020-09-12: qty 150

## 2020-09-12 MED ORDER — SODIUM CHLORIDE 0.9% FLUSH
10.0000 mL | INTRAVENOUS | Status: DC | PRN
  Administered 2020-09-12: 10 mL
  Filled 2020-09-12: qty 10

## 2020-09-12 MED ORDER — SODIUM CHLORIDE 0.9 % IV SOLN
522.0000 mg | Freq: Once | INTRAVENOUS | Status: AC
  Administered 2020-09-12: 520 mg via INTRAVENOUS
  Filled 2020-09-12: qty 52

## 2020-09-12 NOTE — Progress Notes (Signed)
Per RN, patient is COVID positive and is in an isolation bed. RD will follow up with patient by telephone on Wednesday.

## 2020-09-12 NOTE — Progress Notes (Signed)
Ok to treat with low hbg of 7.7 per Thedore Mins, NP.

## 2020-09-12 NOTE — Assessment & Plan Note (Addendum)
This is a very pleasant 50 year old male patient with metastatic HPV positive squamous cell carcinoma currently on treatment with 5-fluorouracil, carboplatin and Keytruda who is here for an interim visit after his recent hospitalization for COVID-19 infection.    Metastatic squamous cell carcinoma: He will proceed with treatment today with Carbo and Keytruda.  We are holding the 5FU for this cycle.  He has scans on 09/14/2020.  Chemotherapy induced anemia: His hemoglobin is 7.7 today.  He is ok to proceed with chemo, but we will get repeat lab next week and transfuse if his hemoglobin is less than 7 or if he has worsening anemia and symptoms.    Mucositis: This is improving.  We are holding the infusional 5FU this cycle which I double checked today.    Recent COVID 19 infection: He continues to slowly improve.  Continue supportive care.

## 2020-09-12 NOTE — Progress Notes (Signed)
[facility] Follow up:    [name], MD [place] [address]  I connected with Sahej Schrieber on 09/13/20 at  1:30 PM EDT by telephone and verified that I am speaking with the correct person using two identifiers.  I discussed the limitations, risks, security and privacy concerns of performing an evaluation and management service by telephone and the availability of in person appointments.  I also discussed with the patient that there may be a patient responsible charge related to this service. The patient expressed understanding and agreed to proceed.   Patient location: [facility] infusion room Provider location: [facility] provider office Others participating in call: nurse maygan, patients wife  I did not have the appropriate PPE available to conduct a face to face visit as planned.    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 Constance Holster of ENT in late 03/2018 for evaluation of an enlarging right neck mass.  CT neck showed necrotic right Level II and III necrotic LN's, possibly from oropharyngeal oral cavity primary, but the study was limited due to streak artifacts.  He underwent FNA of the R Level  II LN, which showed squamous cell carcinoma, p16+.  PET in 05/2018 showed asymmetric FDG uptake in the right tonsil/base of the tongue, likely the primary malignancy.  In addition, there were two FDG-avid R Level II LN's without evidence of contralateral cervical LN or metastatic disease.  Given the relatively low volume disease, Dr Maylon Peppers recommended upfront surgery, followed by adjuvant therapy based on the final pathology. He had TORS on 06/16/2018.  Pathology from the procedure showed: invasive p16-positive (HPV-related) squamous cell carcinoma, 1.4 cm, negative margins. Out of 24 biopsied lymph nodes, only one was positive for metastatic carcinoma (1/24).  This node was 4.5cm but with no extracapsular extension.  No LVSI and no PNI.  He underwent right neck dissection but not left neck dissection.  At postop follow up, they agreed to proceed with observation and reserve radiation therapy for any possible future recurrence.   At follow up on 04/07/2019, Dr. [name] noted a new suspicious left upper neck lymph node. Neck CT performed that day revealed: new heterogeneous 2.2 cm contralateral left level 2a lymph node suspicious for contralateral nodal metastasis; indeterminate mild interval increase in size of several left level 2a and 2b lymph nodes.   PET scan performed on 04/16/2019 showed: single hypermetabolic left cervical lymph node; mild asymmetric FDG uptake in left glossotonsillar sulcus; focal FDG uptake in L1 vertebral body without CT correlate; otherwise, no malignant-range FDG uptake elsewhere.  He underwent left tonsillectomy and left neck dissection on 05/04/2019 with pathology revealing: benign left tonsil. Out of a total of 25 biopsied lymph nodes, only one was positive for metastatic carcinoma but negative for  extracapsular extension and positive for p16.  He was advised adjuvant radiation at this time. He completed radiation on 07/24/2019. He was seen for FU by Dr Isidore Moos in December when  he was doing well. He then started complaining of back pain in February. This led to MRI and PET imaging.  04/26/2020 MRI showed extensive metastatic disease throughout the lumbar spine as well as the sacrum and left iliac bone. No pathologic fracture or epidural Tumor.  05/04/2020 PET CT showed widespread hypermetabolic bone mets. 3.5 cm necrotic hypermetabolic met lesion in right liver. Hypermetabolic uptake in right hilum associated with 2 hypermetabolic lung nodules. Patchy/nodular ground-glass opacity in the right lung having a tree-in-bud configuration. This would be an atypical appearance for metastatic disease and infectious etiology is favored, potentially Atypical.  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 Right level 2 necrotic nodal mass and level 3 necrotic lymph node are highly concerning for nodal metastasis from head and neck primary neoplasm likely oropharynx or the oral cavity. However, evaluation for primary neoplasm in these regions is markedly limited due to streak artifacts from dental amalgam and closed airway possibly secondary to patient's holding breath/swallowing. Recommend direct visual inspection and possibly PET CT scan for further evaluation.  2. Mild asymmetry and fullness of the right nasopharyngeal region, nonspecific.  3. 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    05/07/2018 Procedure   FNA of the R IJ Level II LN     05/07/2018 Pathology Results   ACCESSION NUMBER: [id number]  Right IJ chain lymph node, FNA Squamous cell carcinoma, metastatic; p16+     05/28/2018 Imaging   PET: IMPRESSION: 1. Asymmetric hypermetabolic activity in the RIGHT lingular tonsil/base of tongue region favored primary carcinoma. 2. Two hypermetabolic RIGHT level II metastatic  lymph nodes. 3. No LEFT cervical lymphadenopathy.  No distant metastatic disease.    Malignant neoplasm of base of tongue ([place])  04/24/2018 Imaging   CT neck w/ contrast: 1. Right level 2 necrotic nodal mass and level 3 necrotic lymph node are highly concerning for nodal metastasis from head and neck primary neoplasm likely oropharynx or the oral cavity. However, evaluation for primary neoplasm in these regions is markedly limited due to streak artifacts from dental amalgam and closed airway possibly secondary to patient's holding breath/swallowing. Recommend direct visual inspection and possibly PET CT scan for further evaluation.  2. Mild asymmetry and fullness of the right nasopharyngeal region, nonspecific.  3. 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    05/07/2018 Procedure   FNA of the right cervical LN    05/07/2018 Pathology Results   ACCESSION NUMBER: [id number]  Right IJ chain lymph node, FNA Squamous cell carcinoma, metastatic; p16+     05/28/2018 Imaging   PET: IMPRESSION: 1. Asymmetric hypermetabolic activity in the RIGHT lingular tonsil/base of tongue region favored primary carcinoma. 2. Two hypermetabolic RIGHT level II metastatic lymph nodes. 3. No LEFT cervical lymphadenopathy.  No distant metastatic disease.    06/02/2018 Initial Diagnosis   Malignant neoplasm of base of tongue ([place])    06/02/2018 Cancer Staging   Staging form: Pharynx - HPV-Mediated Oropharynx, AJCC 8th Edition - Clinical: Stage I (cT2, cN1, cM0, p16+) - Signed by [name], MD on 06/02/2018    05/29/2019 Cancer Staging  Staging form: Pharynx - HPV-Mediated Oropharynx, AJCC 8th Edition - Pathologic stage from 05/29/2019: Stage I (rpT0, pN1, cM0, p16+) - Signed by Lonie Peak, MD on 05/29/2019    06/27/2020 - 06/27/2020 Chemotherapy          07/18/2020 -  Chemotherapy    Patient is on Treatment Plan: HEAD/NECK PEMBROLIZUMAB + CARBOPLATIN + 5FU Q21D X 6 CYCLES /  PEMBROLIZUMAB Q21D       Malignant neoplasm of tonsillar fossa (HCC)  06/27/2020 - 06/27/2020 Chemotherapy          07/18/2020 -  Chemotherapy    Patient is on Treatment Plan: HEAD/NECK PEMBROLIZUMAB + CARBOPLATIN + 5FU Q21D X 6 CYCLES / PEMBROLIZUMAB Q21D         CURRENT THERAPY: Pembrolizumab/Carbo--(5-FU on hold)  INTERVAL HISTORY: Tracen Mahler 50 y.o. male returns for evaluation prior to receiving his next cycle of chemotherapy.  He was diagnosed with COVID with sx onset approximately 2 weeks ago.  He is improving slowly, he still has a residual cough on occasion and slowly improving fatigue.  He notes that he has healing mouth ulcers, and he is not supposed to receive the infusional 5-FU with this cycle due to it.  He has some back pain and takes Gabapentin for it which helps.  He is eating and drinking, but does endorse taste changes, and spicy foods in particular are a challenge to eat, so he avoids these.  He has a PET scan scheduled for 09/14/2020.     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  MEDICAL HISTORY: Past Medical History:  Diagnosis Date   Diabetes mellitus without complication (HCC)    Malignant  neoplasm of base of tongue (HCC) 06/02/2018    SURGICAL HISTORY: Past Surgical History:  Procedure Laterality Date   IR IMAGING GUIDED PORT INSERTION  07/15/2020   left neck dissection Left 05/04/2019   Left Neck Dis[facility] by Dr[facility]zzie [facility]ump at [facility].    neck sugery     as a child "had a knot removed from neck" not sure which side.    RIght neck dissection Right 06/16/2018   TORS and right neck dissection. Dr[facility]zz[facility]ump [facility]    SOCIAL HISTORY: Social History   Socioeconomic History   Marital status: Married    Spouse name: Not on file   Number of children: 4   Years of education: Not on file   Highest education level: Not on file  Occupational History   Not on file  Tobacco Use   Smoking status: Never   Smokeless tobacco: Never  Vaping Use   Vaping Use: Never used  Substance and Sexual Activity   Alcohol use: Yes    Comment: occasional   Drug use: Never  Sexual activity: Not on file  Other Topics Concern   Not on file  Social History Narrative   Not on file   Social Determinants of Health   Financial Resource Strain: Not on file  Food Insecurity: Not on file  Transportation Needs: Not on file  Physical Activity: Not on file  Stress: Not on file  Social Connections: Not on file  Intimate Partner Violence: Not on file    FAMILY HISTORY: Non contributory  Review of Systems  Constitutional:  Positive for fatigue. Negative for appetite change, chills, fever and unexpected weight change.  HENT:   Positive for mouth sores (healing). Negative for hearing loss, lump/mass and trouble swallowing.   Eyes:  Negative for eye problems and icterus.  Respiratory:  Negative for chest tightness, cough and shortness of breath.   Cardiovascular:  Negative for chest pain, leg swelling and palpitations.  Gastrointestinal:  Negative for abdominal distention, abdominal pain, constipation, diarrhea, nausea and vomiting.  Endocrine: Negative for hot flashes.  Genitourinary:   Negative for difficulty urinating.   Musculoskeletal:  Positive for back pain. Negative for arthralgias.  Skin:  Negative for itching and rash.  Neurological:  Negative for dizziness, extremity weakness, headaches and numbness.  Hematological:  Negative for adenopathy. Does not bruise/bleed easily.  Psychiatric/Behavioral:  Negative for depression. The patient is not nervous/anxious.      PHYSICAL EXAMINATION  ECOG PERFORMANCE STATUS: 1 - Symptomatic but completely ambulatory See CHL for vitals from the infusion room Patient sounded well, in no apparent distress, breathing was non labored, speech normal, mood and behavior normal.  The RN noted he had some small healing ulcers in his mouth, but overall appeared well and without concerns.      LABORATORY DATA:  CBC    Component Value Date/Time   WBC 4.9 09/12/2020 1351   WBC 1.4 (LL) 08/30/2020 0355   RBC 2.36 (L) 09/12/2020 1351   HGB 7.7 (L) 09/12/2020 1351   HCT 22.8 (L) 09/12/2020 1351   PLT 130 (L) 09/12/2020 1351   MCV 96.6 09/12/2020 1351   MCH 32.6 09/12/2020 1351   MCHC 33.8 09/12/2020 1351   RDW 15.7 (H) 09/12/2020 1351   LYMPHSABS 0.4 (L) 09/12/2020 1351   MONOABS 0.7 09/12/2020 1351   EOSABS 0.0 09/12/2020 1351   BASOSABS 0.0 09/12/2020 1351    CMP     Component Value Date/Time   NA 136 09/12/2020 1351   K 4.4 09/12/2020 1351   CL 102 09/12/2020 1351   CO2 25 09/12/2020 1351   GLUCOSE 224 (H) 09/12/2020 1351   BUN 11 09/12/2020 1351   CREATININE 0.67 09/12/2020 1351   CALCIUM 8.6 (L) 09/12/2020 1351   PROT 6.5 09/12/2020 1351   ALBUMIN 2.5 (L) 09/12/2020 1351   AST 15 09/12/2020 1351   ALT 12 09/12/2020 1351   ALKPHOS 119 09/12/2020 1351   BILITOT <0.2 (L) 09/12/2020 1351   GFRNONAA >60 09/12/2020 1351   GFRAA >60 11/12/2019 1144         ASSESSMENT and THERAPY PLAN:   Malignant neoplasm of base of tongue ([place]) This is a very pleasant 50 year old male patient with metastatic HPV positive  squamous cell carcinoma currently on treatment with 5-fluorouracil, carboplatin and Keytruda who is here for an interim visit after his recent hospitalization for COVID-19 infection.    Metastatic squamous cell carcinoma: He will proceed with treatment today with Carbo and Keytruda.  We are holding the 5FU for this cycle.  He has scans on 09/14/2020.  Chemotherapy induced anemia: His hemoglobin is 7.7 today.  He is ok to proceed with chemo, but we will get repeat lab next week and transfuse if his hemoglobin is less than 7 or if he has worsening anemia and symptoms.    Mucositis: This is improving.  We are holding the infusional 5FU this cycle which I double checked today.    Recent COVID 19 infection: He continues to slowly improve.  Continue supportive care.         All questions were answered. The patient knows to call the clinic with any problems, questions or concerns. We can certainly see the patient much sooner if necessary.  Follow up instructions:    -Return to cancer center in one week for labs  -PET scan 09/14/2020   The patient was provided an opportunity to ask questions and all were answered. The patient agreed with the plan and demonstrated an understanding of the instructions.   The patient was advised to call back or seek an in-person evaluation if the symptoms worsen or if the condition fails to improve as anticipated.   I provided 15 minutes of non face-to-face telephone visit time during this encounter, and > 50% was spent counseling as documented under my assessment & plan.  Wilber Bihari, NP 09/13/20 9:01 AM Medical Oncology and Hematology [facility] [place], [place] [address] Tel. [phone]    Fax. [phone]

## 2020-09-12 NOTE — Patient Instructions (Signed)
[facility] ONCOLOGY  Discharge Instructions: Thank you for choosing [facility] to provide your oncology and hematology care.   If you have a lab appointment with the [facility], please go directly to the [facility] and check in at the registration area.   Wear comfortable clothing and clothing appropriate for easy access to any Portacath or PICC line.   We strive to give you quality time with your provider. You may need to reschedule your appointment if you arrive late (15 or more minutes).  Arriving late affects you and other patients whose appointments are after yours.  Also, if you miss three or more appointments without notifying the office, you may be dismissed from the clinic at the provider's discretion.      For prescription refill requests, have your pharmacy contact our office and allow 72 hours for refills to be completed.    Today you received the following chemotherapy and/or immunotherapy agents keytruda/carboplatin      To help prevent nausea and vomiting after your treatment, we encourage you to take your nausea medication as directed.  BELOW ARE SYMPTOMS THAT SHOULD BE REPORTED IMMEDIATELY: *FEVER GREATER THAN 100.4 F (38 C) OR HIGHER *CHILLS OR SWEATING *NAUSEA AND VOMITING THAT IS NOT CONTROLLED WITH YOUR NAUSEA MEDICATION *UNUSUAL SHORTNESS OF BREATH *UNUSUAL BRUISING OR BLEEDING *URINARY PROBLEMS (pain or burning when urinating, or frequent urination) *BOWEL PROBLEMS (unusual diarrhea, constipation, pain near the anus) TENDERNESS IN MOUTH AND THROAT WITH OR WITHOUT PRESENCE OF ULCERS (sore throat, sores in mouth, or a toothache) UNUSUAL RASH, SWELLING OR PAIN  UNUSUAL VAGINAL DISCHARGE OR ITCHING   Items with * indicate a potential emergency and should be followed up as soon as possible or go to the Emergency Department if any problems should occur.  Please show the CHEMOTHERAPY ALERT CARD or IMMUNOTHERAPY ALERT CARD at  check-in to the Emergency Department and triage nurse.  Should you have questions after your visit or need to cancel or reschedule your appointment, please contact [facility]  Dept: [phone]  and follow the prompts.  Office hours are 8:00 a.m. to 4:30 p.m. Monday - Friday. Please note that voicemails left after 4:00 p.m. may not be returned until the following business day.  We are closed weekends and major holidays. You have access to a nurse at all times for urgent questions. Please call the main number to the clinic Dept: [phone] and follow the prompts.   For any non-urgent questions, you may also contact your provider using MyChart. We now offer e-Visits for anyone 51 and older to request care online for non-urgent symptoms. For details visit [email or web address].   Also download the MyChart app! Go to the app store, search "MyChart", open the app, select [facility], and log in with your MyChart username and password.  Due to Covid, a mask is required upon entering the hospital/clinic. If you do not have a mask, one will be given to you upon arrival. For doctor visits, patients may have 1 support person aged 51 or older with them. For treatment visits, patients cannot have anyone with them due to current Covid guidelines and our immunocompromised population.

## 2020-09-13 ENCOUNTER — Encounter: Payer: Self-pay | Admitting: Hematology and Oncology

## 2020-09-13 ENCOUNTER — Telehealth: Payer: Self-pay | Admitting: Nurse Practitioner

## 2020-09-13 ENCOUNTER — Encounter: Payer: Self-pay | Admitting: Adult Health

## 2020-09-13 LAB — T4: T4, Total: 4.6 ug/dL (ref 4.5–12.0)

## 2020-09-13 NOTE — Telephone Encounter (Signed)
Sch per 06/27 los, pt aware 

## 2020-09-14 ENCOUNTER — Encounter (HOSPITAL_COMMUNITY): Payer: Self-pay

## 2020-09-14 ENCOUNTER — Ambulatory Visit: Payer: Managed Care, Other (non HMO) | Admitting: Dietician

## 2020-09-14 ENCOUNTER — Encounter (HOSPITAL_COMMUNITY)
Admission: RE | Admit: 2020-09-14 | Discharge: 2020-09-14 | Disposition: A | Discharge status: Home / Self Care | Payer: Managed Care, Other (non HMO) | Source: Ambulatory Visit | Attending: Hematology and Oncology | Admitting: Hematology and Oncology

## 2020-09-14 ENCOUNTER — Other Ambulatory Visit: Payer: Self-pay

## 2020-09-14 DIAGNOSIS — C7951 Secondary malignant neoplasm of bone: Secondary | ICD-10-CM

## 2020-09-14 DIAGNOSIS — C01 Malignant neoplasm of base of tongue: Secondary | ICD-10-CM | POA: Insufficient documentation

## 2020-09-14 LAB — GLUCOSE, CAPILLARY
Glucose-Capillary: 315 mg/dL — ABNORMAL HIGH (ref 70–99)
Glucose-Capillary: 330 mg/dL — ABNORMAL HIGH (ref 70–99)
Glucose-Capillary: 321 mg/dL — ABNORMAL HIGH (ref 70–99)

## 2020-09-14 MED ORDER — FLUDEOXYGLUCOSE F - 18 (FDG) INJECTION: 7.0000 | Freq: Once | INTRAVENOUS | Status: DC | PRN

## 2020-09-14 MED ORDER — MIRTAZAPINE 15 MG PO TABS: 30.0000 mg | ORAL_TABLET | Freq: Every day | ORAL | 0 refills | Status: DC

## 2020-09-14 NOTE — Progress Notes (Signed)
Nutrition Follow-up:  Patient with metastatic base of tongue cancer. Patient receiving pembrolizumab/carboplatin (5-FU on hold)  Spoke with patient via telephone. He reports doing pretty good after recent (6/12-6/14) hospital admission due to COVID-19 infection. Patient reports mouth sores are better, but have not healed. Patient reports his appetite has "majorly picked up" He is taking appetite appetite stimulant. He reports usually eating 3 bowls of cheerios and 2 bags of instant grits for breakfast, eats out for lunch recalls soups and dinner meals vary at home. Patient is scheduled for PET scan this afternoon. He reports eating 1 bowl of cheerios with whole fat milk, 2 bags instant grits, oatmeal cream pie and Boost at 5 am. Patient recently started drinking Boost again, says they taste great. He is drinking lots of water, reports this soothes throat and cleans mouth. He is using magic mouthwash, patient is not using baking soda/salt water rinses currently.  Medications: Hycet, Megace ES, Remeron, Decadron  Labs: 6/27 Glucose 224  Anthropometrics: Weight 145 lb 8 oz on 6/27 increased from 139 lb 3.3 oz on 6/14   5/17 - 141 lb  4/29 - 150 lb 8 oz 4/11 - 148 lb 4 oz  NUTRITION DIAGNOSIS: Inadequate oral intake improving  INTERVENTION:  Encouraged high calorie, high protein foods to promote healing Discussed foods best tolerated with sore mouth, soft and moist high protein foods handout provided Suggested baking soda/salt water rinses several times/day High calorie shake recipes provided Samples of Ensure with handouts left at registration desk for pickup on 6/29 Patient has contact information   MONITORING, EVALUATION, GOAL: weight trends, intake   NEXT VISIT: Tuesday July 19 in infusion

## 2020-09-15 ENCOUNTER — Ambulatory Visit (HOSPITAL_COMMUNITY)
Admission: RE | Admit: 2020-09-15 | Discharge: 2020-09-15 | Disposition: A | Discharge status: Home / Self Care | Payer: Managed Care, Other (non HMO) | Source: Ambulatory Visit | Attending: Hematology and Oncology | Admitting: Hematology and Oncology

## 2020-09-15 DIAGNOSIS — R918 Other nonspecific abnormal finding of lung field: Secondary | ICD-10-CM | POA: Insufficient documentation

## 2020-09-15 DIAGNOSIS — C7951 Secondary malignant neoplasm of bone: Secondary | ICD-10-CM | POA: Insufficient documentation

## 2020-09-15 DIAGNOSIS — C01 Malignant neoplasm of base of tongue: Secondary | ICD-10-CM | POA: Insufficient documentation

## 2020-09-15 DIAGNOSIS — C787 Secondary malignant neoplasm of liver and intrahepatic bile duct: Secondary | ICD-10-CM | POA: Insufficient documentation

## 2020-09-15 LAB — GLUCOSE, CAPILLARY: Glucose-Capillary: 136 mg/dL — ABNORMAL HIGH (ref 70–99)

## 2020-09-15 IMAGING — CT NM PET TUM IMG RESTAG (PS) SKULL BASE T - THIGH
1 of 9 series · 2 of 25 positions shown · non-contrast
Comparison: PET-CT [DATE]

CLINICAL DATA: Subsequent treatment strategy for head neck cancer.

EXAM:
NUCLEAR MEDICINE PET SKULL BASE TO THIGH
TECHNIQUE: 6.89 mCi F-18 FDG was injected intravenously. Full-ring PET imaging
was performed from the skull base to thigh after the radiotracer. CT
data was obtained and used for attenuation correction and anatomic
localization.
Fasting blood glucose: 136 mg/dl

[Series 4: ct hn_sk_th 5.0 bf37 · axial · 5.0mm · 0.98mm/px · z∈[-329,-93]mm · 2 of 237 slices shown]
[im 178/237  brain]
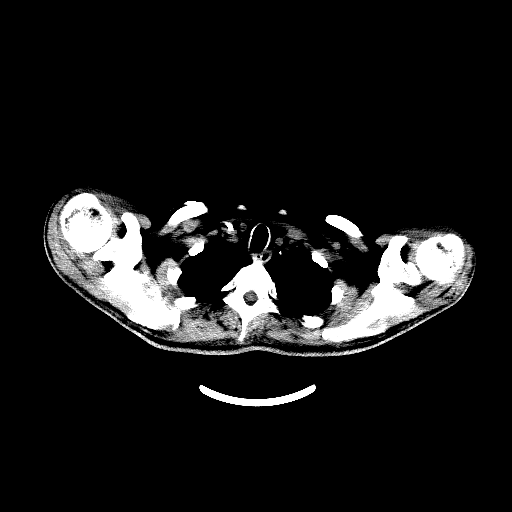
[im 237/237  brain]
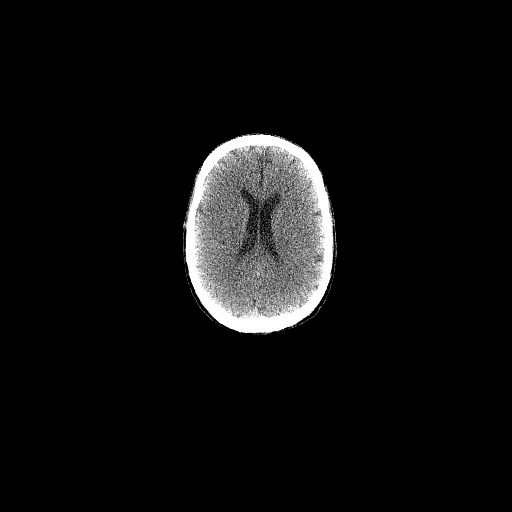

[2 of 25 positions shown; findings below may reference images not displayed]

FINDINGS: Mediastinal blood pool activity: SUV max

Liver activity: SUV max NA

NECK: No hypermetabolic lymph nodes in the neck.

Incidental CT findings: none

CHEST: Inferior to the RIGHT hilum in the medial aspect of the RIGHT
lower lobe there is new hypermetabolic consolidation measuring
cm with SUV max equal 6.2 on image 90.

There is newly enlarged hypermetabolic nodule in the RIGHT middle
lobe measuring 12 mm (image 41/CT series 8) with SUV max equal
( fused data [DATE]).

There is interval resolution in the previously seen patchy
ground-glass nodularity in the RIGHT upper lobe. Decreased metabolic
activity of RIGHT hilar lymph node.

Incidental CT findings: none

ABDOMEN/PELVIS: The large hypermetabolic lesion in the inferior
RIGHT hepatic lobe is improved significantly with minimal residual
metabolic activity (SUV max equal 3.6 decreased from SUV max equal
11.5). The lesion is measurably smaller measuring 20 mm (image 129
CT series 4) compared with 35 mm. No new hepatic lesions.

Incidental CT findings: none

SKELETON: Marked improvement skeletal metastasis. Hypermetabolic
lesions in the sacrum and bilateral iliac bones have completely
resolved. Metabolic activity at the L3 vertebral body has near
completely resolved and now normal background level. Sclerotic bone
lesion remaining.

Likewise intense radiotracer activity was present previously in the
RIGHT humeral head with SUV max equal 10.1 now reduced to SUV max
equal 4.4. Sclerosis of the bone has increased.

Incidental CT findings: none
IMPRESSION: 1. Mixed response to therapy.
2. Marked improvement in skeletal metastasis with near resolution of
metabolic activity of previous multifocal skeletal metastasis.
Sclerotic lesions remain in the underlying bone.
3. Marked improvement in solitary hypermetabolic hepatic metastasis
reduced in metabolic activity and size.
4. Two new lesions in the RIGHT lung which are hypermetabolic. One
RIGHT upper lobe nodule and a new focus of hypermetabolic nodular
consolidation in the RIGHT infrahilar lower lobe.
5. Improvement in ground-glass nodularity in the RIGHT upper lobe
suggest resolved pulmonary infection.

## 2020-09-15 MED ORDER — FLUDEOXYGLUCOSE F - 18 (FDG) INJECTION
6.8000 | Freq: Once | INTRAVENOUS | Status: AC | PRN
  Administered 2020-09-15: 6.89 via INTRAVENOUS

## 2020-09-16 ENCOUNTER — Telehealth: Payer: Self-pay

## 2020-09-16 ENCOUNTER — Other Ambulatory Visit: Payer: Self-pay

## 2020-09-16 ENCOUNTER — Inpatient Hospital Stay: Payer: Managed Care, Other (non HMO) | Attending: Hematology and Oncology

## 2020-09-16 DIAGNOSIS — C01 Malignant neoplasm of base of tongue: Secondary | ICD-10-CM | POA: Diagnosis present

## 2020-09-16 DIAGNOSIS — Z9221 Personal history of antineoplastic chemotherapy: Secondary | ICD-10-CM | POA: Insufficient documentation

## 2020-09-16 DIAGNOSIS — Z79899 Other long term (current) drug therapy: Secondary | ICD-10-CM | POA: Diagnosis not present

## 2020-09-16 DIAGNOSIS — G893 Neoplasm related pain (acute) (chronic): Secondary | ICD-10-CM | POA: Insufficient documentation

## 2020-09-16 DIAGNOSIS — Z794 Long term (current) use of insulin: Secondary | ICD-10-CM | POA: Insufficient documentation

## 2020-09-16 DIAGNOSIS — C7951 Secondary malignant neoplasm of bone: Secondary | ICD-10-CM | POA: Insufficient documentation

## 2020-09-16 DIAGNOSIS — C090 Malignant neoplasm of tonsillar fossa: Secondary | ICD-10-CM

## 2020-09-16 DIAGNOSIS — Z923 Personal history of irradiation: Secondary | ICD-10-CM | POA: Insufficient documentation

## 2020-09-16 DIAGNOSIS — R21 Rash and other nonspecific skin eruption: Secondary | ICD-10-CM | POA: Insufficient documentation

## 2020-09-16 DIAGNOSIS — M549 Dorsalgia, unspecified: Secondary | ICD-10-CM | POA: Diagnosis not present

## 2020-09-16 DIAGNOSIS — J90 Pleural effusion, not elsewhere classified: Secondary | ICD-10-CM | POA: Insufficient documentation

## 2020-09-16 DIAGNOSIS — Z8701 Personal history of pneumonia (recurrent): Secondary | ICD-10-CM | POA: Insufficient documentation

## 2020-09-16 DIAGNOSIS — E119 Type 2 diabetes mellitus without complications: Secondary | ICD-10-CM | POA: Diagnosis not present

## 2020-09-16 LAB — CMP (CANCER CENTER ONLY)
ALT: 41 U/L (ref 0–44)
AST: 27 U/L (ref 15–41)
Albumin: 2.8 g/dL — ABNORMAL LOW (ref 3.5–5.0)
Alkaline Phosphatase: 140 U/L — ABNORMAL HIGH (ref 38–126)
Anion gap: 10 (ref 5–15)
BUN: 16 mg/dL (ref 6–20)
CO2: 27 mmol/L (ref 22–32)
Calcium: 9.2 mg/dL (ref 8.9–10.3)
Chloride: 97 mmol/L — ABNORMAL LOW (ref 98–111)
Creatinine: 0.75 mg/dL (ref 0.61–1.24)
GFR, Estimated: >60 mL/min (ref >60)
Glucose, Bld: 320 mg/dL — ABNORMAL HIGH (ref 70–99)
Potassium: 3.8 mmol/L (ref 3.5–5.1)
Sodium: 134 mmol/L — ABNORMAL LOW (ref 135–145)
Total Bilirubin: 0.2 mg/dL — ABNORMAL LOW (ref 0.3–1.2)
Total Protein: 7.1 g/dL (ref 6.5–8.1)

## 2020-09-16 LAB — CBC WITH DIFFERENTIAL (CANCER CENTER ONLY)
Abs Immature Granulocytes: 0.25 10*3/uL — ABNORMAL HIGH (ref 0.00–0.07)
Basophils Absolute: 0 10*3/uL (ref 0.0–0.1)
Basophils Relative: 0 %
Eosinophils Absolute: 0 10*3/uL (ref 0.0–0.5)
Eosinophils Relative: 0 %
HCT: 25.3 % — ABNORMAL LOW (ref 39.0–52.0)
Hemoglobin: 8.6 g/dL — ABNORMAL LOW (ref 13.0–17.0)
Immature Granulocytes: 3 %
Lymphocytes Relative: 4 %
Lymphs Abs: 0.3 10*3/uL — ABNORMAL LOW (ref 0.7–4.0)
MCH: 32.3 pg (ref 26.0–34.0)
MCHC: 34 g/dL (ref 30.0–36.0)
MCV: 95.1 fL (ref 80.0–100.0)
Monocytes Absolute: 0.7 10*3/uL (ref 0.1–1.0)
Monocytes Relative: 9 %
Neutro Abs: 6.5 10*3/uL (ref 1.7–7.7)
Neutrophils Relative %: 84 %
Platelet Count: 170 10*3/uL (ref 150–400)
RBC: 2.66 MIL/uL — ABNORMAL LOW (ref 4.22–5.81)
RDW: 15.9 % — ABNORMAL HIGH (ref 11.5–15.5)
WBC Count: 7.7 10*3/uL (ref 4.0–10.5)
nRBC: 0.5 % — ABNORMAL HIGH (ref 0.0–0.2)

## 2020-09-16 LAB — SAMPLE TO BLOOD BANK

## 2020-09-16 LAB — TSH: TSH: 2.216 u[IU]/mL (ref 0.320–4.118)

## 2020-09-16 NOTE — Telephone Encounter (Signed)
Patient came in today for his lab appointment. Hgb as of today is 8.6. According to the last OV note, patient was only to be transfused if his Hgb is less than 7. The patient had an appointment tomorrow for a blood transfusion and per Dr. Chryl Heck we have cancelled that appointment. I tried calling the patient and his wife with only the option to leave a VM. I advised the patient on voicemail that we were cancelling his appointment for blood tomorrow given that his Hgb is better and advised him to contact us if he had any questions/concerns.

## 2020-09-16 NOTE — Progress Notes (Signed)
Conveyed the results of PET and labs to the patient. We will consider radiating the two lung areas since he had remarkable response to the other lesions.

## 2020-09-17 ENCOUNTER — Emergency Department (HOSPITAL_COMMUNITY): Payer: Managed Care, Other (non HMO)

## 2020-09-17 ENCOUNTER — Encounter (HOSPITAL_COMMUNITY): Payer: Self-pay | Admitting: Emergency Medicine

## 2020-09-17 ENCOUNTER — Other Ambulatory Visit: Payer: Self-pay

## 2020-09-17 ENCOUNTER — Inpatient Hospital Stay (HOSPITAL_COMMUNITY)
Admission: EM | Admit: 2020-09-17 | Discharge: 2020-09-23 | DRG: 871 | Disposition: A | Discharge status: Home / Self Care | Payer: Managed Care, Other (non HMO) | Attending: Internal Medicine | Admitting: Internal Medicine

## 2020-09-17 DIAGNOSIS — E878 Other disorders of electrolyte and fluid balance, not elsewhere classified: Secondary | ICD-10-CM | POA: Diagnosis present

## 2020-09-17 DIAGNOSIS — D6481 Anemia due to antineoplastic chemotherapy: Secondary | ICD-10-CM | POA: Diagnosis present

## 2020-09-17 DIAGNOSIS — R1310 Dysphagia, unspecified: Secondary | ICD-10-CM | POA: Diagnosis present

## 2020-09-17 DIAGNOSIS — Z79899 Other long term (current) drug therapy: Secondary | ICD-10-CM

## 2020-09-17 DIAGNOSIS — R627 Adult failure to thrive: Secondary | ICD-10-CM | POA: Diagnosis not present

## 2020-09-17 DIAGNOSIS — C7951 Secondary malignant neoplasm of bone: Secondary | ICD-10-CM | POA: Diagnosis present

## 2020-09-17 DIAGNOSIS — Z8249 Family history of ischemic heart disease and other diseases of the circulatory system: Secondary | ICD-10-CM

## 2020-09-17 DIAGNOSIS — D84821 Immunodeficiency due to drugs: Secondary | ICD-10-CM | POA: Diagnosis present

## 2020-09-17 DIAGNOSIS — R739 Hyperglycemia, unspecified: Secondary | ICD-10-CM | POA: Diagnosis not present

## 2020-09-17 DIAGNOSIS — J159 Unspecified bacterial pneumonia: Secondary | ICD-10-CM | POA: Diagnosis present

## 2020-09-17 DIAGNOSIS — T451X5A Adverse effect of antineoplastic and immunosuppressive drugs, initial encounter: Secondary | ICD-10-CM | POA: Diagnosis present

## 2020-09-17 DIAGNOSIS — A419 Sepsis, unspecified organism: Principal | ICD-10-CM | POA: Diagnosis present

## 2020-09-17 DIAGNOSIS — Z880 Allergy status to penicillin: Secondary | ICD-10-CM | POA: Diagnosis not present

## 2020-09-17 DIAGNOSIS — J189 Pneumonia, unspecified organism: Secondary | ICD-10-CM | POA: Diagnosis present

## 2020-09-17 DIAGNOSIS — E43 Unspecified severe protein-calorie malnutrition: Secondary | ICD-10-CM | POA: Diagnosis present

## 2020-09-17 DIAGNOSIS — C787 Secondary malignant neoplasm of liver and intrahepatic bile duct: Secondary | ICD-10-CM | POA: Diagnosis present

## 2020-09-17 DIAGNOSIS — Y95 Nosocomial condition: Secondary | ICD-10-CM | POA: Diagnosis present

## 2020-09-17 DIAGNOSIS — Z923 Personal history of irradiation: Secondary | ICD-10-CM

## 2020-09-17 DIAGNOSIS — Z8616 Personal history of COVID-19: Secondary | ICD-10-CM

## 2020-09-17 DIAGNOSIS — I959 Hypotension, unspecified: Secondary | ICD-10-CM | POA: Diagnosis present

## 2020-09-17 DIAGNOSIS — M25511 Pain in right shoulder: Secondary | ICD-10-CM | POA: Diagnosis not present

## 2020-09-17 DIAGNOSIS — E1165 Type 2 diabetes mellitus with hyperglycemia: Secondary | ICD-10-CM | POA: Diagnosis present

## 2020-09-17 DIAGNOSIS — C01 Malignant neoplasm of base of tongue: Secondary | ICD-10-CM

## 2020-09-17 DIAGNOSIS — C7952 Secondary malignant neoplasm of bone marrow: Secondary | ICD-10-CM | POA: Diagnosis present

## 2020-09-17 DIAGNOSIS — R0602 Shortness of breath: Secondary | ICD-10-CM | POA: Diagnosis not present

## 2020-09-17 DIAGNOSIS — C760 Malignant neoplasm of head, face and neck: Secondary | ICD-10-CM | POA: Diagnosis not present

## 2020-09-17 DIAGNOSIS — J181 Lobar pneumonia, unspecified organism: Secondary | ICD-10-CM | POA: Diagnosis not present

## 2020-09-17 DIAGNOSIS — C7801 Secondary malignant neoplasm of right lung: Secondary | ICD-10-CM | POA: Diagnosis present

## 2020-09-17 DIAGNOSIS — K1230 Oral mucositis (ulcerative), unspecified: Secondary | ICD-10-CM | POA: Diagnosis present

## 2020-09-17 DIAGNOSIS — E871 Hypo-osmolality and hyponatremia: Secondary | ICD-10-CM | POA: Diagnosis present

## 2020-09-17 DIAGNOSIS — E119 Type 2 diabetes mellitus without complications: Secondary | ICD-10-CM

## 2020-09-17 DIAGNOSIS — R64 Cachexia: Secondary | ICD-10-CM | POA: Diagnosis present

## 2020-09-17 DIAGNOSIS — Z681 Body mass index (BMI) 19 or less, adult: Secondary | ICD-10-CM | POA: Diagnosis not present

## 2020-09-17 DIAGNOSIS — C109 Malignant neoplasm of oropharynx, unspecified: Secondary | ICD-10-CM | POA: Diagnosis not present

## 2020-09-17 DIAGNOSIS — C090 Malignant neoplasm of tonsillar fossa: Secondary | ICD-10-CM

## 2020-09-17 DIAGNOSIS — D649 Anemia, unspecified: Secondary | ICD-10-CM | POA: Diagnosis not present

## 2020-09-17 DIAGNOSIS — R918 Other nonspecific abnormal finding of lung field: Secondary | ICD-10-CM | POA: Diagnosis not present

## 2020-09-17 DIAGNOSIS — R079 Chest pain, unspecified: Secondary | ICD-10-CM | POA: Diagnosis not present

## 2020-09-17 LAB — CBC
HCT: 27.2 % — ABNORMAL LOW (ref 39.0–52.0)
Hemoglobin: 9.2 g/dL — ABNORMAL LOW (ref 13.0–17.0)
MCH: 33.2 pg (ref 26.0–34.0)
MCHC: 33.8 g/dL (ref 30.0–36.0)
MCV: 98.2 fL (ref 80.0–100.0)
Platelets: 204 10*3/uL (ref 150–400)
RBC: 2.77 MIL/uL — ABNORMAL LOW (ref 4.22–5.81)
RDW: 15.9 % — ABNORMAL HIGH (ref 11.5–15.5)
WBC: 10.5 10*3/uL (ref 4.0–10.5)
nRBC: 0.3 % — ABNORMAL HIGH (ref 0.0–0.2)

## 2020-09-17 LAB — URINALYSIS, ROUTINE W REFLEX MICROSCOPIC
Bilirubin Urine: NEGATIVE
Glucose, UA: NEGATIVE mg/dL
Hgb urine dipstick: NEGATIVE
Ketones, ur: NEGATIVE mg/dL
Leukocytes,Ua: NEGATIVE
Nitrite: NEGATIVE
Protein, ur: NEGATIVE mg/dL
Specific Gravity, Urine: 1.02 (ref 1.005–1.030)
pH: 8 (ref 5.0–8.0)

## 2020-09-17 LAB — RESP PANEL BY RT-PCR (FLU A&B, COVID) ARPGX2
Influenza A by PCR: NEGATIVE
Influenza B by PCR: NEGATIVE
SARS Coronavirus 2 by RT PCR: POSITIVE — AB

## 2020-09-17 LAB — BASIC METABOLIC PANEL
Anion gap: 9 (ref 5–15)
BUN: 12 mg/dL (ref 6–20)
CO2: 26 mmol/L (ref 22–32)
Calcium: 8.8 mg/dL — ABNORMAL LOW (ref 8.9–10.3)
Chloride: 95 mmol/L — ABNORMAL LOW (ref 98–111)
Creatinine, Ser: 0.69 mg/dL (ref 0.61–1.24)
GFR, Estimated: >60 mL/min (ref >60)
Glucose, Bld: 208 mg/dL — ABNORMAL HIGH (ref 70–99)
Potassium: 4.2 mmol/L (ref 3.5–5.1)
Sodium: 130 mmol/L — ABNORMAL LOW (ref 135–145)

## 2020-09-17 LAB — APTT: aPTT: 32 seconds (ref 24–36)

## 2020-09-17 LAB — TROPONIN I (HIGH SENSITIVITY)
Troponin I (High Sensitivity): 14 ng/L (ref <18)
Troponin I (High Sensitivity): 8 ng/L (ref <18)

## 2020-09-17 LAB — LACTIC ACID, PLASMA: Lactic Acid, Venous: 1.1 mmol/L (ref 0.5–1.9)

## 2020-09-17 LAB — MRSA NEXT GEN BY PCR, NASAL: MRSA by PCR Next Gen: NOT DETECTED

## 2020-09-17 LAB — PROTIME-INR
INR: 1.2 (ref 0.8–1.2)
Prothrombin Time: 14.9 seconds (ref 11.4–15.2)

## 2020-09-17 LAB — T4: T4, Total: 4.9 ug/dL (ref 4.5–12.0)

## 2020-09-17 IMAGING — CT CT ANGIO CHEST
2 of 7 series · 18 of 46 positions shown · IV contrast (omnipaque)
Comparison: [DATE] PET CT

CLINICAL DATA: 50-year-old male with cough, shortness of breath and
RIGHT chest pain.

EXAM:
CT ANGIOGRAPHY CHEST WITH CONTRAST
TECHNIQUE: Multidetector CT imaging of the chest was performed using the
standard protocol during bolus administration of intravenous
contrast. Multiplanar CT image reconstructions and MIPs were
obtained to evaluate the vascular anatomy.
CONTRAST:  51mL OMNIPAQUE IOHEXOL 350 MG/ML SOLN

[Series 8: thins · axial · 0.85mm/px · z∈[+935,+1209]mm · 15 of 308 slices shown]
[im 17/308  lung]
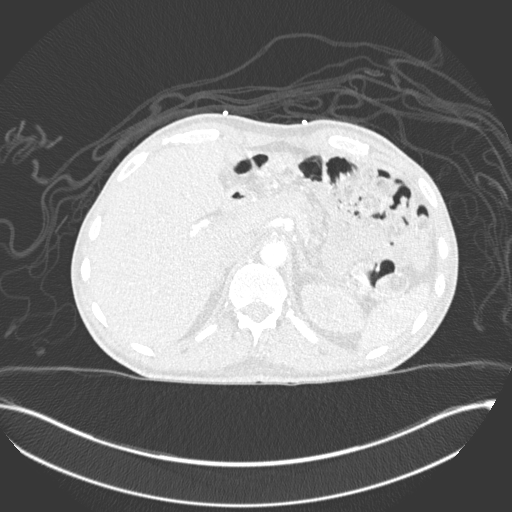
[im 33/308  soft-tissue]
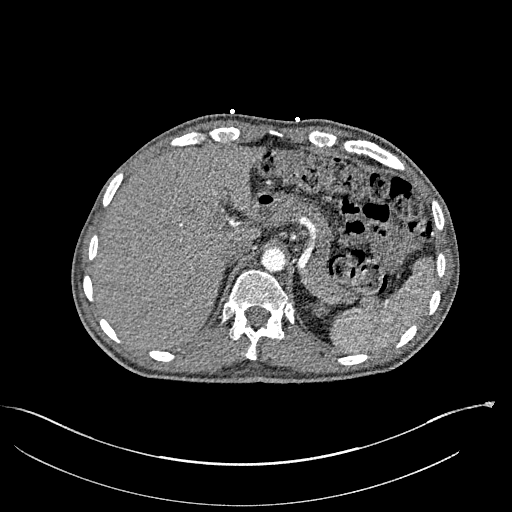
[im 65/308  lung]
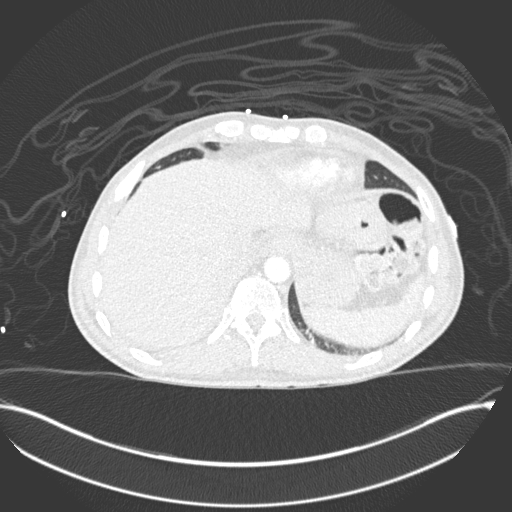
[im 81/308  soft-tissue]
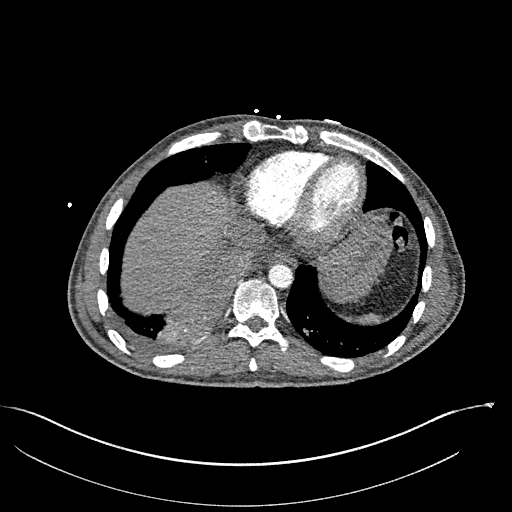
[im 97/308  lung]
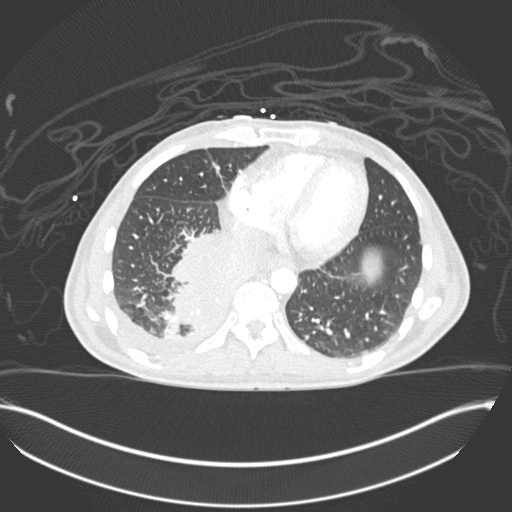
[im 114/308  soft-tissue]
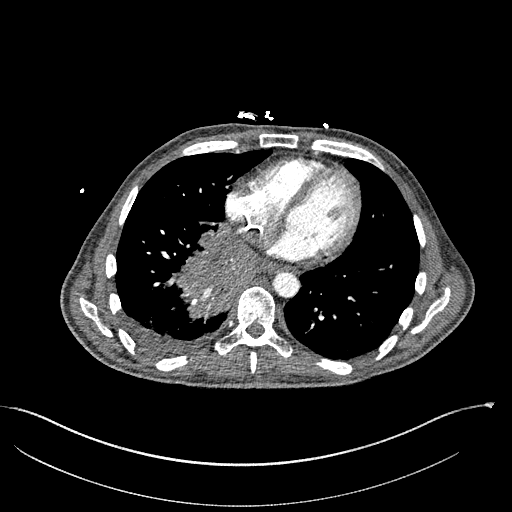
[im 130/308  lung]
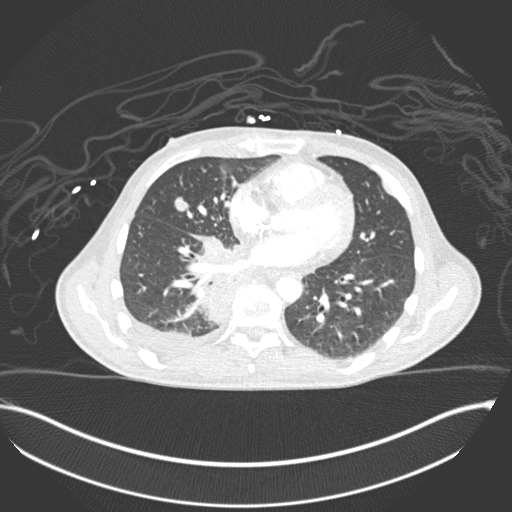
[im 162/308  soft-tissue]
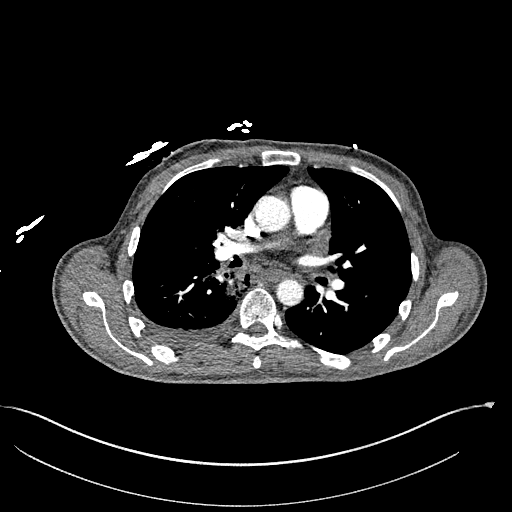
[im 178/308  lung]
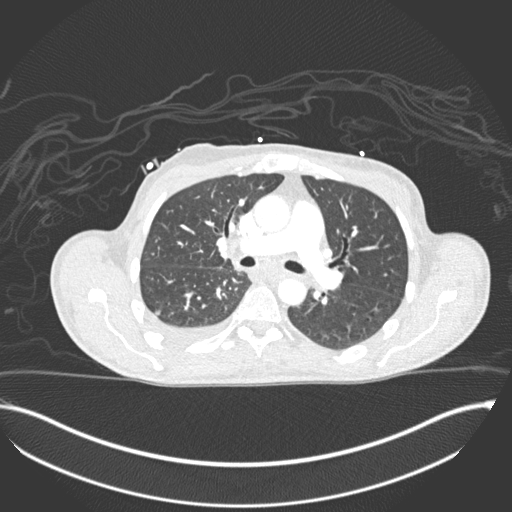
[im 194/308  soft-tissue]
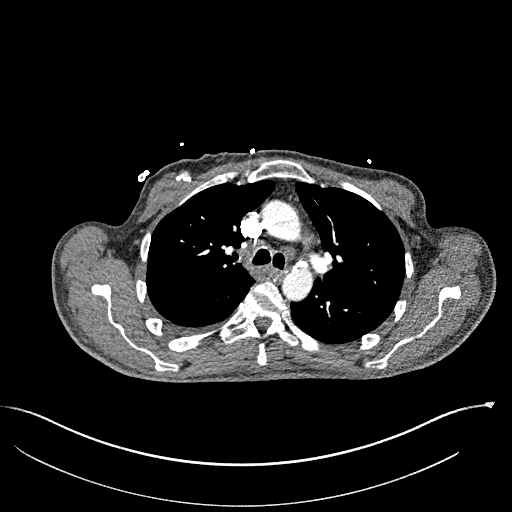
[im 211/308  lung]
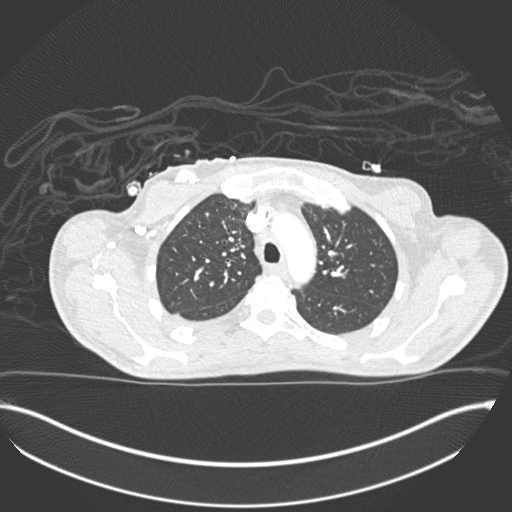
[im 227/308  soft-tissue]
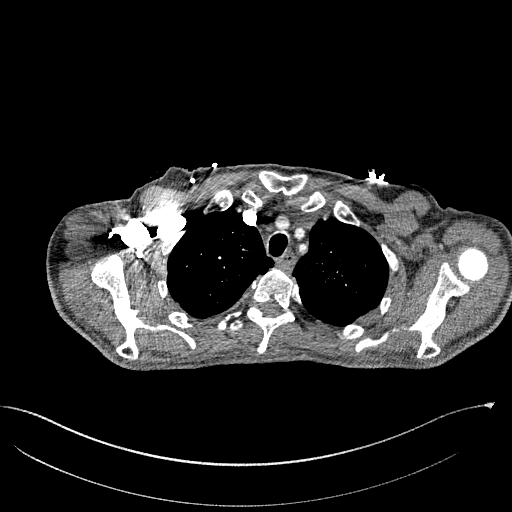
[im 259/308  lung]
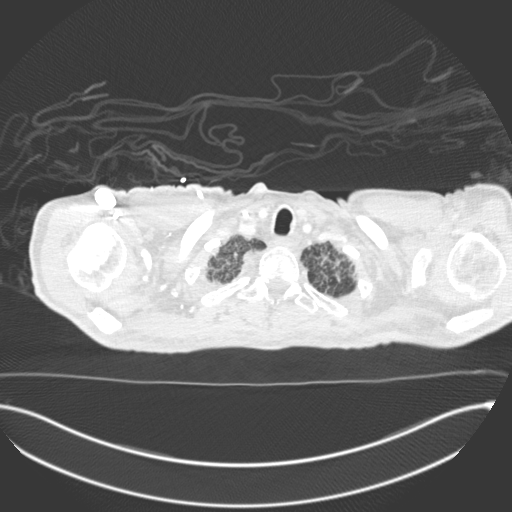
[im 275/308  soft-tissue]
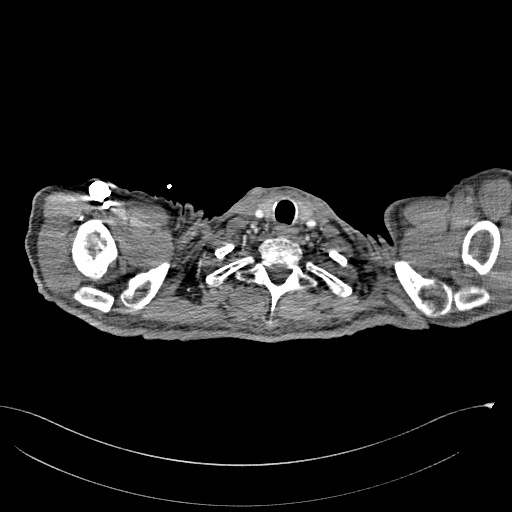
[im 291/308  lung]
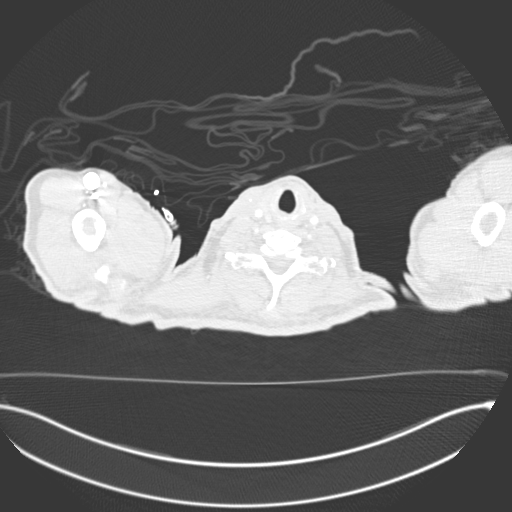

[Series 10: coronal mpr · coronal · 0.63mm/px · 3 of 117 slices shown]
[im 30/117  soft-tissue]
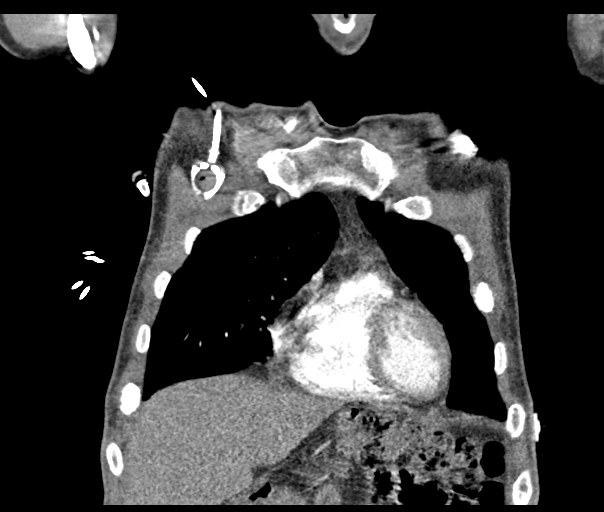
[im 59/117  soft-tissue]
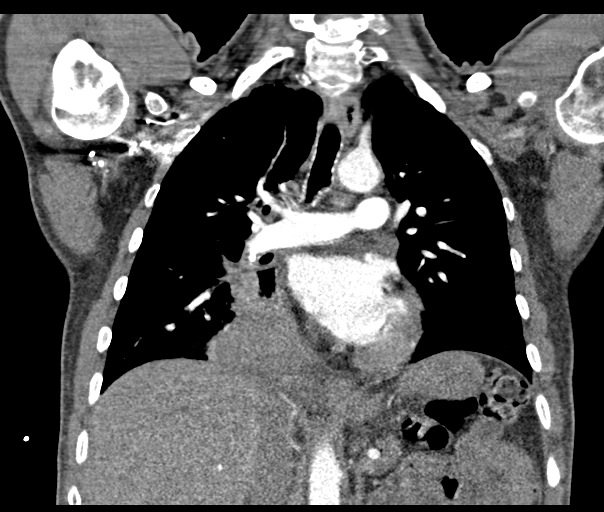
[im 88/117  soft-tissue]
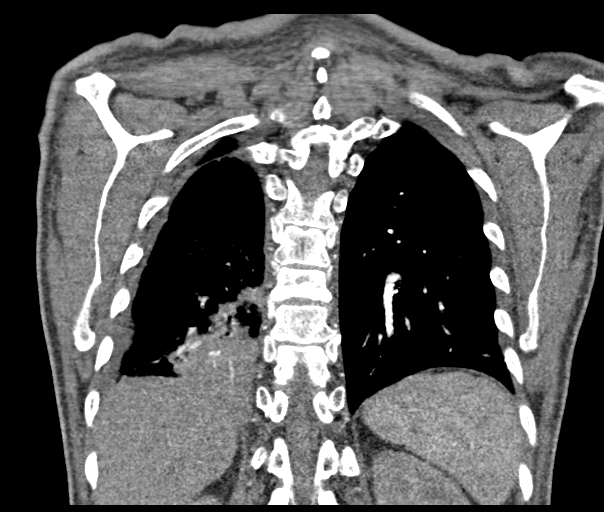

[18 of 46 positions shown; findings below may reference images not displayed]

FINDINGS: Cardiovascular: Satisfactory opacification of the pulmonary arteries
to the segmental level. No evidence of pulmonary embolism. Normal
heart size. No pericardial effusion.

Mediastinum/Nodes: No significant change. Enlarged RIGHT hilar lymph
nodes are identified.

Lungs/Pleura: Masslike consolidation within the RIGHT LOWER lobe now
measures 10.3 x 5.1 cm, previously 2.7 x 2.5 cm on [DATE] study.

A 12 mm RIGHT middle lobe nodule is unchanged. A small RIGHT pleural
effusion is now identified.

No pneumothorax or LEFT pleural effusion.

Upper Abdomen: No acute abnormality.

Musculoskeletal: Sclerotic lesions within bilateral ribs, proximal
RIGHT humerus, sternum and thoracic spine again noted

Review of the MIP images confirms the above findings.
IMPRESSION: 1. No evidence of pulmonary emboli.
2. Rapid increase in large RIGHT LOWER lobe masslike consolidation
since [DATE] almost certainly related to infection/pneumonia
given significant change over 2 days. Component of underlying
malignancy is not excluded however. New small RIGHT pleural
effusion.
3. Unchanged 12 mm RIGHT middle lobe nodule and sclerotic lesions
within bilateral ribs, proximal RIGHT humerus, sternum and thoracic
spine compatible with metastatic disease.

## 2020-09-17 IMAGING — CR DG CHEST 2V
2 series · 2 of 2 positions shown · non-contrast
Comparison: [DATE] PET-CT, [DATE] chest radiograph and
prior studies

CLINICAL DATA: RIGHT chest pain with cough. History of head and
neck cancer.

EXAM:
CHEST - 2 VIEW

[chest pa]
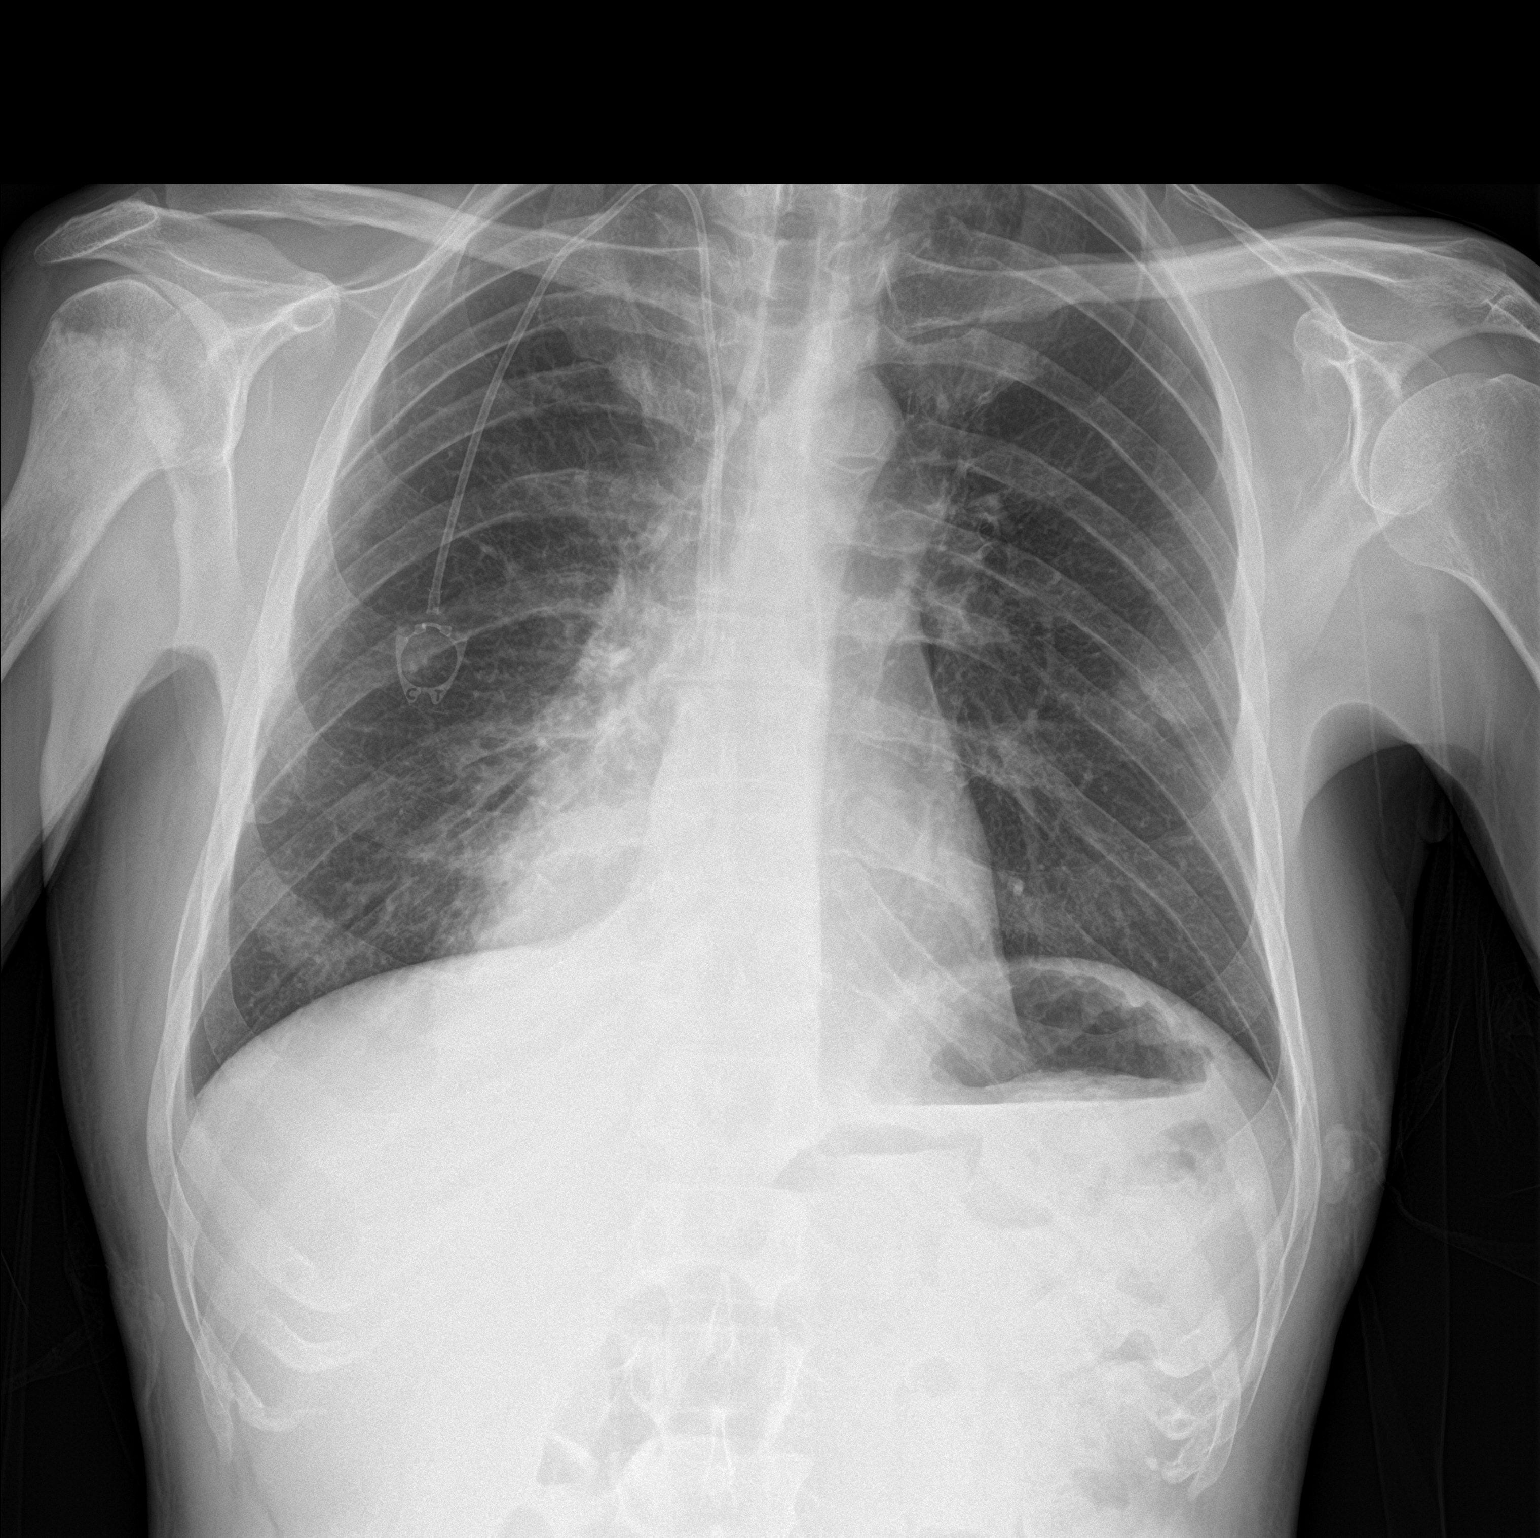

[chest lat]
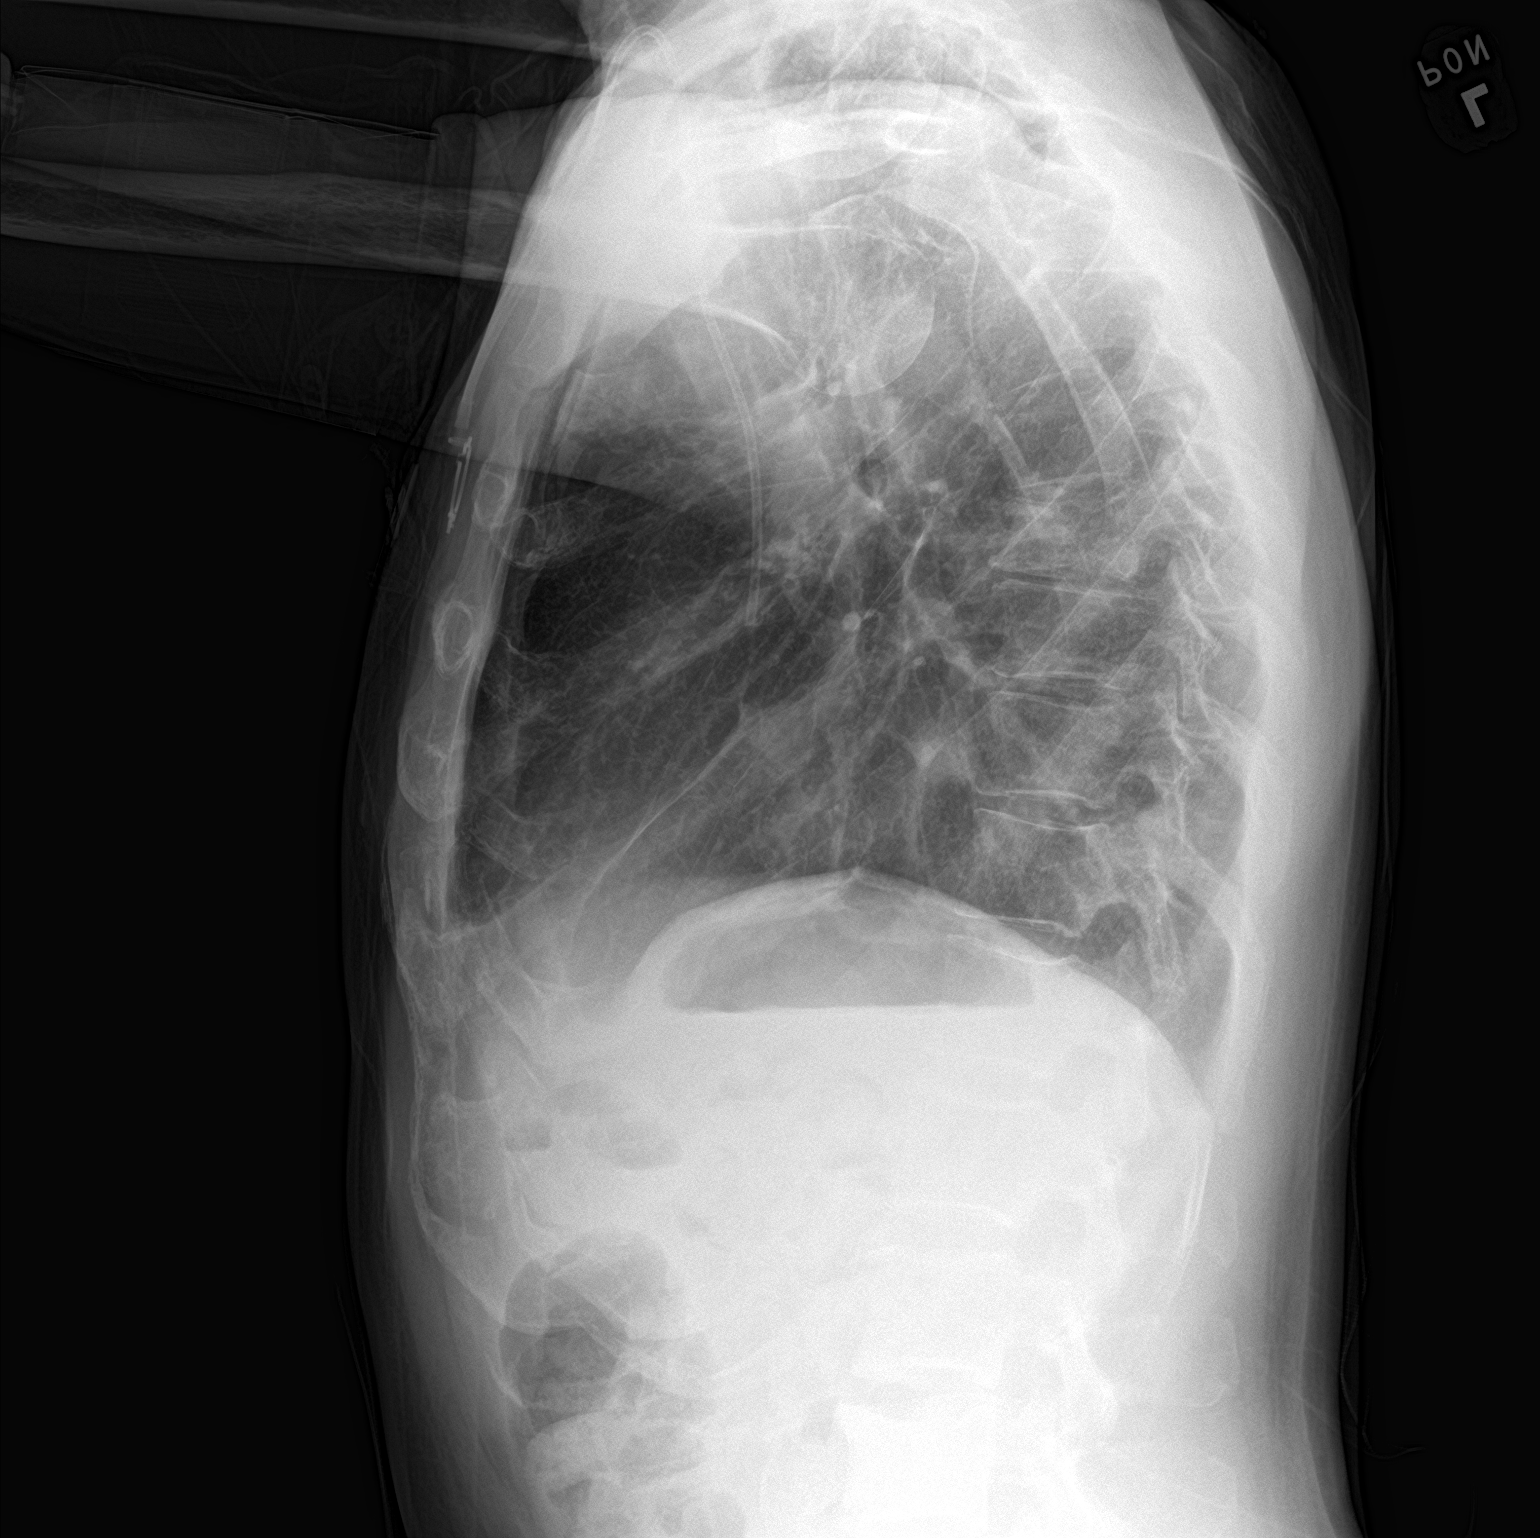

[2 of 2 positions shown; findings below may reference images not displayed]

FINDINGS: RIGHT MEDIAL basilar opacity/consolidation appears increased from
recent studies.

Cardiomediastinal silhouette is unchanged.

No pleural effusion or pneumothorax identified.

A RIGHT Port-A-Cath with tip overlying SUPERIOR cavoatrial junction
is again noted.

Sclerotic lesion involving anterior LEFT 4th rib is again noted.
IMPRESSION: RIGHT MEDIAL basilar opacity/consolidation, increased from recent
studies and may represent infection, aspiration and/or atelectasis.

No other significant change.

## 2020-09-17 MED ORDER — BENZONATATE 100 MG PO CAPS
200.0000 mg | ORAL_CAPSULE | Freq: Three times a day (TID) | ORAL | Status: DC
  Administered 2020-09-17 – 2020-09-23 (×18): 200 mg via ORAL
  Filled 2020-09-17 (×18): qty 2

## 2020-09-17 MED ORDER — GABAPENTIN 300 MG PO CAPS
900.0000 mg | ORAL_CAPSULE | Freq: Three times a day (TID) | ORAL | Status: DC
  Administered 2020-09-17 – 2020-09-23 (×16): 900 mg via ORAL
  Filled 2020-09-17 (×18): qty 3

## 2020-09-17 MED ORDER — HYDROCODONE-ACETAMINOPHEN 7.5-325 MG/15ML PO SOLN: 10.0000 mL | Freq: Four times a day (QID) | ORAL | Status: DC | PRN

## 2020-09-17 MED ORDER — MEGESTROL ACETATE 625 MG/5ML PO SUSP
625.0000 mg | Freq: Every day | ORAL | Status: DC
  Administered 2020-09-18: 625 mg via ORAL
  Filled 2020-09-17 (×2): qty 5

## 2020-09-17 MED ORDER — SODIUM CHLORIDE 0.9 % IV SOLN
2.0000 g | Freq: Three times a day (TID) | INTRAVENOUS | Status: DC
  Administered 2020-09-17 – 2020-09-23 (×19): 2 g via INTRAVENOUS
  Filled 2020-09-17 (×19): qty 2

## 2020-09-17 MED ORDER — MEGESTROL ACETATE 625 MG/5ML PO SUSP: 625.0000 mg | Freq: Every day | ORAL | Status: DC

## 2020-09-17 MED ORDER — MIRTAZAPINE 15 MG PO TABS
30.0000 mg | ORAL_TABLET | Freq: Every day | ORAL | Status: DC
  Administered 2020-09-17 – 2020-09-18 (×2): 30 mg via ORAL
  Administered 2020-09-20 – 2020-09-22 (×3): 15 mg via ORAL
  Filled 2020-09-17 (×4): qty 2
  Filled 2020-09-17: qty 1
  Filled 2020-09-17: qty 2

## 2020-09-17 MED ORDER — GUAIFENESIN 100 MG/5ML PO SOLN
5.0000 mL | ORAL | Status: DC | PRN
  Administered 2020-09-18 – 2020-09-19 (×2): 100 mg via ORAL
  Filled 2020-09-17 (×2): qty 5

## 2020-09-17 MED ORDER — ACETAMINOPHEN 325 MG PO TABS
650.0000 mg | ORAL_TABLET | Freq: Once | ORAL | Status: AC
  Administered 2020-09-17: 650 mg via ORAL
  Filled 2020-09-17: qty 2

## 2020-09-17 MED ORDER — INSULIN ASPART 100 UNIT/ML IJ SOLN
0.0000 [IU] | Freq: Three times a day (TID) | INTRAMUSCULAR | Status: DC
Start: 2020-09-18 — End: 2020-09-23
  Administered 2020-09-18: 3 [IU] via SUBCUTANEOUS
  Administered 2020-09-18 – 2020-09-19 (×3): 2 [IU] via SUBCUTANEOUS
  Administered 2020-09-19: 5 [IU] via SUBCUTANEOUS
  Administered 2020-09-19: 3 [IU] via SUBCUTANEOUS
  Administered 2020-09-20 (×2): 5 [IU] via SUBCUTANEOUS
  Administered 2020-09-21: 8 [IU] via SUBCUTANEOUS
  Administered 2020-09-21: 5 [IU] via SUBCUTANEOUS
  Administered 2020-09-21: 3 [IU] via SUBCUTANEOUS
  Administered 2020-09-22 (×2): 5 [IU] via SUBCUTANEOUS
  Administered 2020-09-22 – 2020-09-23 (×2): 3 [IU] via SUBCUTANEOUS
  Administered 2020-09-23 (×2): 8 [IU] via SUBCUTANEOUS

## 2020-09-17 MED ORDER — IOHEXOL 350 MG/ML SOLN
51.0000 mL | Freq: Once | INTRAVENOUS | Status: AC | PRN
  Administered 2020-09-17: 51 mL via INTRAVENOUS

## 2020-09-17 MED ORDER — ONDANSETRON 4 MG PO TBDP: 8.0000 mg | ORAL_TABLET | Freq: Three times a day (TID) | ORAL | Status: DC | PRN

## 2020-09-17 MED ORDER — LACTATED RINGERS IV SOLN: INTRAVENOUS | Status: DC

## 2020-09-17 MED ORDER — LEVOFLOXACIN IN D5W 750 MG/150ML IV SOLN: 750.0000 mg | Freq: Once | INTRAVENOUS | Status: DC

## 2020-09-17 MED ORDER — IPRATROPIUM-ALBUTEROL 20-100 MCG/ACT IN AERS
1.0000 | INHALATION_SPRAY | Freq: Four times a day (QID) | RESPIRATORY_TRACT | Status: DC
  Administered 2020-09-18 (×4): 1 via RESPIRATORY_TRACT
  Filled 2020-09-17: qty 4

## 2020-09-17 MED ORDER — ENOXAPARIN SODIUM 40 MG/0.4ML IJ SOSY
40.0000 mg | PREFILLED_SYRINGE | INTRAMUSCULAR | Status: DC
  Administered 2020-09-17 – 2020-09-22 (×6): 40 mg via SUBCUTANEOUS
  Filled 2020-09-17 (×5): qty 0.4

## 2020-09-17 MED ORDER — LEVOFLOXACIN IN D5W 750 MG/150ML IV SOLN: 750.0000 mg | INTRAVENOUS | Status: DC

## 2020-09-17 MED ORDER — IPRATROPIUM-ALBUTEROL 0.5-2.5 (3) MG/3ML IN SOLN
3.0000 mL | Freq: Four times a day (QID) | RESPIRATORY_TRACT | Status: DC
  Administered 2020-09-17: 3 mL via RESPIRATORY_TRACT
  Filled 2020-09-17: qty 3

## 2020-09-17 MED ORDER — ENSURE ENLIVE PO LIQD
237.0000 mL | Freq: Two times a day (BID) | ORAL | Status: DC
  Administered 2020-09-19: 237 mL via ORAL
  Filled 2020-09-17: qty 237

## 2020-09-17 MED ORDER — SODIUM CHLORIDE 0.9 % IV SOLN
500.0000 mg | INTRAVENOUS | Status: DC
  Administered 2020-09-17 – 2020-09-23 (×7): 500 mg via INTRAVENOUS
  Filled 2020-09-17 (×7): qty 500

## 2020-09-17 MED ORDER — LIDOCAINE VISCOUS HCL 2 % MT SOLN
15.0000 mL | Freq: Three times a day (TID) | OROMUCOSAL | Status: DC | PRN
  Filled 2020-09-17: qty 15

## 2020-09-17 MED ORDER — ONDANSETRON HCL 4 MG PO TABS: 4.0000 mg | ORAL_TABLET | Freq: Four times a day (QID) | ORAL | Status: DC | PRN

## 2020-09-17 MED ORDER — LACTATED RINGERS IV BOLUS (SEPSIS)
1000.0000 mL | Freq: Once | INTRAVENOUS | Status: AC
  Administered 2020-09-17: 1000 mL via INTRAVENOUS

## 2020-09-17 MED ORDER — DIPHENHYDRAMINE HCL 25 MG PO CAPS
25.0000 mg | ORAL_CAPSULE | Freq: Every day | ORAL | Status: DC
  Administered 2020-09-17 – 2020-09-18 (×2): 25 mg via ORAL
  Filled 2020-09-17 (×5): qty 1

## 2020-09-17 MED ORDER — DEXAMETHASONE 4 MG PO TABS: 8.0000 mg | ORAL_TABLET | Freq: Every day | ORAL | Status: DC

## 2020-09-17 MED ORDER — ONDANSETRON HCL 4 MG/2ML IJ SOLN: 4.0000 mg | Freq: Four times a day (QID) | INTRAMUSCULAR | Status: DC | PRN

## 2020-09-17 NOTE — ED Notes (Signed)
Pt to CT, alert, NAD, calm, interactive, VS improved, HR lower 95. IVF and abx infusing.

## 2020-09-17 NOTE — H&P (Signed)
History and Physical    Albert Flowers YQM:[id number] DOB: 1970-08-17 DOA: 09/17/2020  PCP: [name], MD (Confirm with patient/family/NH records and if not entered, this has to be entered at [facility] point of entry) Patient coming from: Home  I have personally briefly reviewed patient's old medical records in [place]  Chief Complaint: Chest pain  HPI: Albert Flowers is a 50 y.o. 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  Patient woke up this morning with new onset of right-sided sharp-like chest pain, worsening with cough and deep breath.  He also reported subjective fever since last night.  Patient also has a chronic cough which is dry, and he has lost about 20 to 3 pound since January this year.  Patient has had multiple PET scan since last year, previous one showed suspicious right lung nodules/mass, debating between oncology at [place] for aspiration pneumonia (x2) versus malignancy.  However most recent PET showed significant enlargement of the mass on the right lung indicating malignancy.  And patient oncology at [facility] been talking to radiation oncology to set up radiation therapy of the right lung.'s chemotherapy regimen used to be carboplatin plus 5-FU, immunotherapy with Keytruda.  5-FU was dropped recently due to severe recurrent mucositis.   ED Course: Fever 101.9. CT angiogram showed right-sided lung mass overlying with possible pneumonia.  Review of Systems: As per HPI otherwise 14 point review of systems negative.    Past Medical History:  Diagnosis Date   Diabetes mellitus without complication ([place])    Malignant neoplasm of base of tongue ([place])  06/02/2018    Past Surgical History:  Procedure Laterality Date   IR IMAGING GUIDED PORT INSERTION  07/15/2020   left neck dissection Left 05/04/2019   Left Neck Dissection by Dr. [name] at [facility].    neck sugery     as a child "had a knot removed from neck" not sure which side.    RIght neck dissection Right 06/16/2018   TORS and right neck dissection. Dr. [name] at [facility]     reports that he has never smoked. He has never used smokeless tobacco. He reports current alcohol use. He reports that he does not use drugs.  Allergies  Allergen Reactions   Penicillins Other (See Comments)    Unknown reaction    No family history on file.   Prior to Admission medications   Medication Sig Start Date End Date Taking? Authorizing Provider  dexamethasone (DECADRON) 4 MG tablet Take 2 tablets (8 mg total) by mouth daily. Start the day after carboplatin chemotherapy for 3 days. 07/12/20   Benay Pike, MD  diphenhydrAMINE (BENADRYL) 25 MG tablet Take 25 mg by mouth at bedtime.    [provider]  gabapentin (NEURONTIN) 300 MG capsule Take 900 mg by mouth 3 (three) times daily.    [provider]  guaiFENesin (ROBITUSSIN) 100 MG/5ML SOLN Take 5 mLs (100 mg total) by mouth every 4 (four) hours as needed for cough or to loosen phlegm. 08/24/20   Benay Pike, MD  HYDROcodone-acetaminophen (HYCET) 7.5-325 mg/15 ml solution Take 10 mLs by mouth 4 (four) times daily as needed for moderate pain. 08/23/20   Benay Pike, MD  lidocaine (XYLOCAINE) 2 % solution  Use as directed 15 mLs in the mouth or throat every 8 (eight) hours as needed for mouth pain. 08/09/20   Benay Pike, MD  lidocaine-prilocaine (EMLA) cream Apply to affected area once 07/12/20   Benay Pike, MD  megestrol (MEGACE ES) 625 MG/5ML suspension Take 5 mLs (625 mg total) by mouth daily. 07/22/20   Benay Pike, MD  mirtazapine (REMERON) 15 MG tablet Take 2 tablets (30 mg total) by mouth at bedtime. 09/14/20    Benay Pike, MD  nystatin (MYCOSTATIN) 100000 UNIT/ML suspension Take 5 mLs (500,000 Units total) by mouth 4 (four) times daily. Swish and spit or swallow until better. 07/29/19   Tanner, Lyndon Code., PA-C  ondansetron (ZOFRAN ODT) 8 MG disintegrating tablet Take 1 tablet (8 mg total) by mouth every 8 (eight) hours as needed for nausea or vomiting. 05/06/20   [name], MD    Physical Exam: Vitals:   09/17/20 1545 09/17/20 1600 09/17/20 1656 09/17/20 1900  BP: 104/75 113/79  97/62  Pulse: 95 94  100  Resp: (!) 27   (!) 24  Temp:   (!) 101.9 F (38.8 C)   TempSrc:   Oral   SpO2: 100% 100%  97%  Weight:      Height:        Constitutional: NAD, calm, comfortable Vitals:   09/17/20 1545 09/17/20 1600 09/17/20 1656 09/17/20 1900  BP: 104/75 113/79  97/62  Pulse: 95 94  100  Resp: (!) 27   (!) 24  Temp:   (!) 101.9 F (38.8 C)   TempSrc:   Oral   SpO2: 100% 100%  97%  Weight:      Height:       Eyes: PERRL, lids and conjunctivae normal ENMT: Mucous membranes are moist. Posterior pharynx clear of any exudate or lesions.Normal dentition.  Neck: normal, supple, no masses, no thyromegaly Respiratory: clear to auscultation bilaterally, no wheezing, right sided crackles. Normal respiratory effort. No accessory muscle use.  Cardiovascular: Regular rate and rhythm, no murmurs / rubs / gallops. No extremity edema. 2+ pedal pulses. No carotid bruits.  Abdomen: no tenderness, no masses palpated. No hepatosplenomegaly. Bowel sounds positive.  Musculoskeletal: no clubbing / cyanosis. No joint deformity upper and lower extremities. Good ROM, no contractures. Normal muscle tone.  Skin: no rashes, lesions, ulcers. No induration Neurologic: CN 2-12 grossly intact. Sensation intact, DTR normal. Strength 5/5 in all 4.  Psychiatric: Normal judgment and insight. Alert and oriented x 3. Normal mood.     Labs on Admission: I have personally reviewed following labs and imaging studies  CBC: Recent  Labs  Lab 09/12/20 1351 09/16/20 1118 09/17/20 1204  WBC 4.9 7.7 10.5  NEUTROABS 3.4 6.5  --   HGB 7.7* 8.6* 9.2*  HCT 22.8* 25.3* 27.2*  MCV 96.6 95.1 98.2  PLT 130* 170 782   Basic Metabolic Panel: Recent Labs  Lab 09/12/20 1351 09/16/20 1118 09/17/20 1204  NA 136 134* 130*  K 4.4 3.8 4.2  CL 102 97* 95*  CO2 25 27 26   GLUCOSE 224* 320* 208*  BUN 11 16 12   CREATININE 0.67 0.75 0.69  CALCIUM 8.6* 9.2 8.8*   GFR: Estimated Creatinine Clearance: 101.7 mL/min (by C-G formula based on SCr of 0.69 mg/dL). Liver Function Tests: Recent Labs  Lab 09/12/20 1351 09/16/20 1118  AST 15 27  ALT 12 41  ALKPHOS 119 140*  BILITOT <0.2* 0.2*  PROT 6.5 7.1  ALBUMIN 2.5* 2.8*   No results for input(s):  LIPASE, AMYLASE in the last 168 hours. No results for input(s): AMMONIA in the last 168 hours. Coagulation Profile: Recent Labs  Lab 09/17/20 1346  INR 1.2   Cardiac Enzymes: No results for input(s): CKTOTAL, CKMB, CKMBINDEX, TROPONINI in the last 168 hours. BNP (last 3 results) No results for input(s): PROBNP in the last 8760 hours. HbA1C: No results for input(s): HGBA1C in the last 72 hours. CBG: Recent Labs  Lab 09/14/20 1304 09/14/20 1306 09/14/20 1338 09/15/20 0737  GLUCAP 330* 315* 321* 136*   Lipid Profile: No results for input(s): CHOL, HDL, LDLCALC, TRIG, CHOLHDL, LDLDIRECT in the last 72 hours. Thyroid Function Tests: Recent Labs    09/16/20 1117 09/16/20 1118  TSH 2.216  --   T4TOTAL  --  4.9   Anemia Panel: No results for input(s): VITAMINB12, FOLATE, FERRITIN, TIBC, IRON, RETICCTPCT in the last 72 hours. Urine analysis:    Component Value Date/Time   COLORURINE YELLOW 08/28/2020 2245   APPEARANCEUR CLEAR 08/28/2020 2245   LABSPEC 1.014 08/28/2020 2245   PHURINE 7.0 08/28/2020 2245   GLUCOSEU NEGATIVE 08/28/2020 2245   HGBUR NEGATIVE 08/28/2020 2245   BILIRUBINUR NEGATIVE 08/28/2020 2245   KETONESUR NEGATIVE 08/28/2020 2245   PROTEINUR  NEGATIVE 08/28/2020 2245   NITRITE NEGATIVE 08/28/2020 2245   LEUKOCYTESUR NEGATIVE 08/28/2020 2245    Radiological Exams on Admission: DG Chest 2 View  Result Date: 09/17/2020 CLINICAL DATA:  RIGHT chest pain with cough. History of head and neck cancer. EXAM: CHEST - 2 VIEW COMPARISON:  09/15/2020 PET-CT, 08/28/2020 chest radiograph and prior studies FINDINGS: RIGHT MEDIAL basilar opacity/consolidation appears increased from recent studies. Cardiomediastinal silhouette is unchanged. No pleural effusion or pneumothorax identified. A RIGHT Port-A-Cath with tip overlying SUPERIOR cavoatrial junction is again noted. Sclerotic lesion involving anterior LEFT 4th rib is again noted. IMPRESSION: RIGHT MEDIAL basilar opacity/consolidation, increased from recent studies and may represent infection, aspiration and/or atelectasis. No other significant change. Electronically Signed   By: Margarette Canada M.D.   On: 09/17/2020 12:27   CT Angio Chest PE W and/or Wo Contrast  Result Date: 09/17/2020 CLINICAL DATA:  50 year old male with cough, shortness of breath and RIGHT chest pain. EXAM: CT ANGIOGRAPHY CHEST WITH CONTRAST TECHNIQUE: Multidetector CT imaging of the chest was performed using the standard protocol during bolus administration of intravenous contrast. Multiplanar CT image reconstructions and MIPs were obtained to evaluate the vascular anatomy. CONTRAST:  39mL OMNIPAQUE IOHEXOL 350 MG/ML SOLN COMPARISON:  09/15/2020 PET CT FINDINGS: Cardiovascular: Satisfactory opacification of the pulmonary arteries to the segmental level. No evidence of pulmonary embolism. Normal heart size. No pericardial effusion. Mediastinum/Nodes: No significant change. Enlarged RIGHT hilar lymph nodes are identified. Lungs/Pleura: Masslike consolidation within the RIGHT LOWER lobe now measures 10.3 x 5.1 cm, previously 2.7 x 2.5 cm on 09/15/2020 study. A 12 mm RIGHT middle lobe nodule is unchanged. A small RIGHT pleural effusion is now  identified. No pneumothorax or LEFT pleural effusion. Upper Abdomen: No acute abnormality. Musculoskeletal: Sclerotic lesions within bilateral ribs, proximal RIGHT humerus, sternum and thoracic spine again noted Review of the MIP images confirms the above findings. IMPRESSION: 1. No evidence of pulmonary emboli. 2. Rapid increase in large RIGHT LOWER lobe masslike consolidation since 60/12/9321 almost certainly related to infection/pneumonia given significant change over 2 days. Component of underlying malignancy is not excluded however. New small RIGHT pleural effusion. 3. Unchanged 12 mm RIGHT middle lobe nodule and sclerotic lesions within bilateral ribs, proximal RIGHT humerus, sternum and thoracic spine compatible with  metastatic disease. Electronically Signed   By: Margarette Canada M.D.   On: 09/17/2020 17:02    EKG: Independently reviewed.  Normal sinus rhythm, no acute ST changes  Assessment/Plan Active Problems:   PNA (pneumonia)  (please populate well all problems here in Problem List. (For example, if patient is on BP meds at home and you resume or decide to hold them, it is a problem that needs to be her. Same for CAD, COPD, HLD and so on)  HCAP, bacterial -Given most recent hospital stay, will treat as HCAP.  Concerning with immune suppression with ongoing chemotherapy, cover resistant organism, continue current regimen cefepime plus azithromycin. -Check sputum culture, Legionella, mycoplasma and strep antigen. -Discussed with patient and his wife at bedside regarding clinical course, likely there is underneath lung metastasis, recommend repeat CT chest in 4 to 6 weeks versus as per his oncologist plan for radiation therapy after pneumonia treatment. -Other supporting therapy, Tessalon, breathing treatment  SIRS -Impending sepsis, tachycardia and tachypneic and fever, infection source as right-sided HCAP.  Elevated glucose -Probably secondary to oncology related steroid use -Sliding scale  for now, may change to p.o. diabetic meds  Hyponatremia -Euvolemic, probably related to metastatic cancer.  Fluid restriction.  Anemia -Check iron study, denies any bleeding or black tarry stool.  Probably related to bone marrow mets.  Severe protein calorie malnutrition/cachexia -Ensure, prognosis poor.   DVT prophylaxis: Lovenox Code Status: Full code Family Communication: Wife at bedside Disposition Plan: Expect more than 2 midnight hospital stay to treat HCAP Consults called: None Admission status: Tele admit   Lequita Halt MD [facility] Pager [phone]  09/17/2020, 7:23 PM

## 2020-09-17 NOTE — ED Provider Notes (Signed)
[facility] EMERGENCY DEPARTMENT Provider Note   CSN: [id number] Arrival date & time: 09/17/20  1125     History No chief complaint on file.   Arturo Sofranko is a 50 y.o. male.  The history is provided by the patient, the spouse and medical records. No language interpreter was used.    50 year old male with significant history of tongue cancer currently undergoing chemotherapy who presents for evaluation of shortness of breath.  Patient report having pleuritic chest pain on the right side of his chest, having shortness of breath with deep breathing, with some associated nausea and body aches since yesterday.  Pain in his chest radiates to his shoulder and to his back.  Symptoms moderate in severity waxing waning worse with movement.  Endorsing chills without fever.  No runny nose sneezing sore throat dysuria vomiting or diarrhea.  He has been vaccinated for COVID-19.  He had COVID infection last month.  He denies any leg swelling or calf pain.  No prior history of PE or DVT.    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    Patient Active Problem List   Diagnosis Date Noted   Sepsis ([place]) 08/29/2020   Pancytopenia ([place]) 08/29/2020   Nausea & vomiting 08/29/2020   Generalized weakness 08/29/2020   Acute hyponatremia 08/29/2020   Dehydration 08/29/2020   Protein calorie malnutrition ([place]) 08/29/2020   Diabetes mellitus without complication ([place])    OZHYQ-[id number] virus infection 08/28/2020   Cough 08/24/2020   Cancer related pain 08/09/2020   Mucositis due to chemotherapy 07/27/2020   Intractable hiccoughs 07/27/2020   Weight loss, unintentional 07/27/2020   Anorexia 07/12/2020   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 [name] at [facility].    neck sugery     as a child "had a knot removed from neck" not sure which side.    RIght neck dissection Right 06/16/2018   TORS and right neck dissection. Dr. [name] at [facility]       No family history on file.  Social History   Tobacco Use   Smoking status: Never   Smokeless tobacco: Never  Vaping Use   Vaping Use: Never used  Substance Use Topics   Alcohol use: Yes    Comment: occasional   Drug use: Never    Home Medications Prior to Admission medications   Medication Sig Start Date End Date Taking? Authorizing Provider  dexamethasone (DECADRON) 4 MG tablet Take 2 tablets (8 mg total) by mouth daily. Start the day after carboplatin chemotherapy for 3 days. 07/12/20   Benay Pike, MD  diphenhydrAMINE (BENADRYL) 25 MG tablet Take 25 mg by mouth at bedtime.    [provider]  gabapentin (NEURONTIN) 300 MG capsule Take 900 mg by mouth 3 (three) times daily.    [provider]  guaiFENesin (ROBITUSSIN) 100 MG/5ML SOLN Take 5 mLs (100 mg total) by mouth every 4 (four) hours as needed for cough or to loosen phlegm. 08/24/20   Benay Pike, MD  HYDROcodone-acetaminophen (HYCET) 7.5-325 mg/15  ml solution Take 10 mLs by mouth 4 (four) times daily as needed for moderate pain. 08/23/20   Benay Pike, MD  lidocaine (XYLOCAINE) 2 % solution Use as directed 15 mLs in the mouth or throat every 8 (eight) hours as needed for mouth pain. 08/09/20   Benay Pike, MD  lidocaine-prilocaine (EMLA) cream Apply to affected area once 07/12/20   Benay Pike, MD  megestrol (MEGACE ES) 625 MG/5ML suspension Take 5 mLs (625 mg total) by mouth daily. 07/22/20   Benay Pike, MD  mirtazapine (REMERON) 15 MG tablet Take 2 tablets (30 mg total) by mouth at bedtime.  09/14/20   Benay Pike, MD  nystatin (MYCOSTATIN) 100000 UNIT/ML suspension Take 5 mLs (500,000 Units total) by mouth 4 (four) times daily. Swish and spit or swallow until better. 07/29/19   Tanner, Lyndon Code., PA-C  ondansetron (ZOFRAN ODT) 8 MG disintegrating tablet Take 1 tablet (8 mg total) by mouth every 8 (eight) hours as needed for nausea or vomiting. 05/06/20   [name], MD    Allergies    Penicillins  Review of Systems   Review of Systems  All other systems reviewed and are negative.  Physical Exam Updated Vital Signs BP 96/69   Pulse (!) 106   Temp 98.6 F (37 C) (Oral)   Resp (!) 21   Ht 5\' 10"  (1.778 m)   Wt 65.8 kg   SpO2 100%   BMI 20.81 kg/m   Physical Exam Vitals and nursing note reviewed.  Constitutional:      General: He is not in acute distress.    Appearance: He is well-developed.  HENT:     Head: Atraumatic.     Mouth/Throat:     Mouth: Mucous membranes are moist.  Eyes:     Conjunctiva/sclera: Conjunctivae normal.  Cardiovascular:     Rate and Rhythm: Tachycardia present.     Pulses: Normal pulses.     Heart sounds: Normal heart sounds.  Pulmonary:     Effort: Pulmonary effort is normal.     Breath sounds: Normal breath sounds. No wheezing, rhonchi or rales.  Chest:     Chest wall: Tenderness (Tenderness to right chest wall on palpation without overlying skin changes.) present.  Musculoskeletal:     Cervical back: Neck supple.  Skin:    Findings: No rash.  Neurological:     Mental Status: He is alert.    ED Results / Procedures / Treatments   Labs (all labs ordered are listed, but only abnormal results are displayed) Labs Reviewed  CBC - Abnormal; Notable for the following components:      Result Value   RBC 2.77 (*)    Hemoglobin 9.2 (*)    HCT 27.2 (*)    RDW 15.9 (*)    nRBC 0.3 (*)    All other components within normal limits  BASIC METABOLIC PANEL  TROPONIN I (HIGH SENSITIVITY)    EKG None  Radiology DG Chest 2  View  Result Date: 09/17/2020 CLINICAL DATA:  RIGHT chest pain with cough. History of head and neck cancer. EXAM: CHEST - 2 VIEW COMPARISON:  09/15/2020 PET-CT, 08/28/2020 chest radiograph and prior studies FINDINGS: RIGHT MEDIAL basilar opacity/consolidation appears increased from recent studies. Cardiomediastinal silhouette is unchanged. No pleural effusion or pneumothorax identified. A RIGHT Port-A-Cath with tip overlying SUPERIOR cavoatrial junction is again noted. Sclerotic lesion involving anterior LEFT 4th rib is again noted. IMPRESSION: RIGHT MEDIAL basilar opacity/consolidation, increased from recent studies and may represent infection,  aspiration and/or atelectasis. No other significant change. Electronically Signed   By: Margarette Canada M.D.   On: 09/17/2020 12:27    Procedures .Critical Care  Date/Time: 09/17/2020 3:48 PM Performed by: Domenic Moras, PA-C Authorized by: Domenic Moras, PA-C   Critical care provider statement:    Critical care time (minutes):  34   Critical care was time spent personally by me on the following activities:  Discussions with consultants, evaluation of patient's response to treatment, examination of patient, ordering and performing treatments and interventions, ordering and review of laboratory studies, ordering and review of radiographic studies, pulse oximetry, re-evaluation of patient's condition, obtaining history from patient or surrogate and review of old charts   Medications Ordered in ED Medications  lactated ringers infusion ( Intravenous New Bag/Given 09/17/20 1517)  ceFEPIme (MAXIPIME) 2 g in sodium chloride 0.9 % 100 mL IVPB (2 g Intravenous New Bag/Given 09/17/20 1503)  azithromycin (ZITHROMAX) 500 mg in sodium chloride 0.9 % 250 mL IVPB (500 mg Intravenous New Bag/Given 09/17/20 1518)  lactated ringers bolus 1,000 mL (1,000 mLs Intravenous New Bag/Given 09/17/20 1502)    And  lactated ringers bolus 1,000 mL (1,000 mLs Intravenous New Bag/Given 09/17/20 1503)     ED Course  I have reviewed the triage vital signs and the nursing notes.  Pertinent labs & imaging results that were available during my care of the patient were reviewed by me and considered in my medical decision making (see chart for details).    MDM Rules/Calculators/A&P                          BP 108/72   Pulse 96   Temp 98.6 F (37 C) (Oral)   Resp (!) 34   Ht 5\' 10"  (1.778 m)   Wt 65.8 kg   SpO2 100%   BMI 20.81 kg/m   Final Clinical Impression(s) / ED Diagnoses Final diagnoses:  Community acquired pneumonia of right middle lobe of lung    Rx / DC Orders ED Discharge Orders     None      Patient will significant history of tongue cancer who currently receiving chemotherapy therefore which makes him immunocompromised.  He is here with pleuritic chest pain in the right side of the chest as well as cough and chills.  He felt warm to the touch.  Chest x-ray shows right focal opacity concerning for pneumonia.  Will treat with antibiotic.  However with history of cancer patient is at increased risk of PE.  Will obtain chest CT angiogram to rule out PE.  Patient given antibiotic, code sepsis initiated, lactic acid is currently pending, rectal exam has been ordered.  IV fluid given at 30 mL/kg.  Patient was hypotensive tachycardic and tachypneic however not hypoxic.  Patient signed out to oncoming provider who will follow up on the remainder of the labs and will determine disposition.  Anticipate possible admission.   Domenic Moras, PA-C 09/17/20 1548    Fredia Sorrow, MD 09/18/20 [phone]

## 2020-09-17 NOTE — ED Triage Notes (Signed)
Pt is on chemo.  Fell asleep in recliner last night and woke up with R sided chest pain during the night.  Took Gabapentin without relief.  Reports SOB this morning.  No pain at present.  Pain worse with movement and deep inspiration.

## 2020-09-17 NOTE — ED Notes (Signed)
Admitting MD at [facility].  

## 2020-09-17 NOTE — Progress Notes (Signed)
Elink monitoring for sepsis protocol 

## 2020-09-17 NOTE — ED Notes (Signed)
Admitting MD at [facility]. VSS. Pt alert, NAD, calm, interactive. Wife at [facility].

## 2020-09-17 NOTE — ED Provider Notes (Signed)
  Physical Exam  BP 113/79   Pulse 94   Temp (!) 101.9 F (38.8 C) (Oral)   Resp (!) 27   Ht 5\' 10"  (1.778 m)   Wt 65.8 kg   SpO2 100%   BMI 20.81 kg/m   Physical Exam  ED Course/Procedures   Clinical Course as of 09/17/20 1811  Sat Sep 17, 2020  1809 Consult to hospitalist, Dr. Roosevelt Locks, who was agreeable to seeing this patient and admitting to his service.  Appreciate his collaboration in the care of this patient. [RS]    Clinical Course User Index [RS] Yarnell Arvidson, Gypsy Balsam, PA-C    Procedures  MDM   Care of this patient was assumed from preceding ED provider Domenic Moras, PA-C at time of shift change.  Please see his associated note for further insight into the patient's ED course.    In brief, patient is a 50 year old male who is currently undergoing chemotherapy for lingual cancer who presents with concern for right-sided chest pain onset last night with associated shortness of breath and cough.  Pain is exacerbated with deep inspiration and movement.  CBC without leukocytosis, patient is immunocompromised on chemotherapy, hemoglobin 9.2, near patient's baseline.  BMP with hyponatremia 130 and hypochloremia.  Code sepsis was activated as he was tachycardic, hypotensive, and tachypneic.  Code sepsis labs pending at this time.  Chest x-ray did reveal right medial basilar opacity versus consolidation which could represent infection or aspiration.  Given hypotension and tachycardia, CT angiogram to rule out PE was ordered.  At time of shift change patient is pending CTA and code sepsis labs.  He has already received IV antibiotic therapy.  Per preceding ED provider, will likely require admission to the hospital for pneumonia with hemodynamic instability given his compromised status.  Lactic acid is normal, 1.1, coag studies are normal.  MRSA not detected, the patient is positive for COVID-19.  CT angiogram revealed Rapid increase in large right lower lobe masslike consolidation since  09/15/2020.  Almost certainly related to infection or pneumonia given changes over the last 2 days.  Given hemodynamic instability with tachycardia, tachypnea, and borderline blood pressures, feel patient warrants admission to the hospital for further stabilization.  Consult to hospitalist as above.  Patient COVID-positive 2 weeks ago, not new.  Taelyn voiced understanding of his medical evaluation and treatment plan.  Each of his questions was answered to his expressed satisfaction.  He is amenable to plan for admission at this time.  This chart was dictated using voice recognition software, Dragon. Despite the best efforts of this provider to proofread and correct errors, errors may still occur which can change documentation meaning.    Emeline Darling, PA-C 09/17/20 1811    Lucrezia Starch, MD 09/18/20 [phone]

## 2020-09-18 LAB — RETICULOCYTES
Immature Retic Fract: 42.5 % — ABNORMAL HIGH (ref 2.3–15.9)
RBC.: 2.27 MIL/uL — ABNORMAL LOW (ref 4.22–5.81)
Retic Count, Absolute: 48.8 10*3/uL (ref 19.0–186.0)
Retic Ct Pct: 2.2 % (ref 0.4–3.1)

## 2020-09-18 LAB — BASIC METABOLIC PANEL
Anion gap: 6 (ref 5–15)
BUN: 8 mg/dL (ref 6–20)
CO2: 24 mmol/L (ref 22–32)
Calcium: 8.3 mg/dL — ABNORMAL LOW (ref 8.9–10.3)
Chloride: 99 mmol/L (ref 98–111)
Creatinine, Ser: 0.54 mg/dL — ABNORMAL LOW (ref 0.61–1.24)
GFR, Estimated: >60 mL/min (ref >60)
Glucose, Bld: 219 mg/dL — ABNORMAL HIGH (ref 70–99)
Potassium: 3.8 mmol/L (ref 3.5–5.1)
Sodium: 129 mmol/L — ABNORMAL LOW (ref 135–145)

## 2020-09-18 LAB — LACTIC ACID, PLASMA: Lactic Acid, Venous: 1.4 mmol/L (ref 0.5–1.9)

## 2020-09-18 LAB — HEMOGLOBIN A1C
Hgb A1c MFr Bld: 7.7 % — ABNORMAL HIGH (ref 4.8–5.6)
Mean Plasma Glucose: 174.29 mg/dL

## 2020-09-18 LAB — IRON AND TIBC
Iron: 16 ug/dL — ABNORMAL LOW (ref 45–182)
Saturation Ratios: 9 % — ABNORMAL LOW (ref 17.9–39.5)
TIBC: 172 ug/dL — ABNORMAL LOW (ref 250–450)
UIBC: 156 ug/dL

## 2020-09-18 LAB — CBC
HCT: 22.5 % — ABNORMAL LOW (ref 39.0–52.0)
Hemoglobin: 7.4 g/dL — ABNORMAL LOW (ref 13.0–17.0)
MCH: 32 pg (ref 26.0–34.0)
MCHC: 32.9 g/dL (ref 30.0–36.0)
MCV: 97.4 fL (ref 80.0–100.0)
Platelets: 168 10*3/uL (ref 150–400)
RBC: 2.31 MIL/uL — ABNORMAL LOW (ref 4.22–5.81)
RDW: 16.1 % — ABNORMAL HIGH (ref 11.5–15.5)
WBC: 8 10*3/uL (ref 4.0–10.5)
nRBC: 0 % (ref 0.0–0.2)

## 2020-09-18 LAB — GLUCOSE, CAPILLARY
Glucose-Capillary: 167 mg/dL — ABNORMAL HIGH (ref 70–99)
Glucose-Capillary: 137 mg/dL — ABNORMAL HIGH (ref 70–99)

## 2020-09-18 MED ORDER — SENNA 8.6 MG PO TABS: 1.0000 | ORAL_TABLET | Freq: Every day | ORAL | Status: DC | PRN

## 2020-09-18 MED ORDER — MAGIC MOUTHWASH
15.0000 mL | Freq: Three times a day (TID) | ORAL | Status: DC
  Administered 2020-09-18 – 2020-09-23 (×15): 15 mL via ORAL
  Filled 2020-09-18 (×17): qty 15

## 2020-09-18 MED ORDER — CHLORHEXIDINE GLUCONATE CLOTH 2 % EX PADS
6.0000 | MEDICATED_PAD | Freq: Every day | CUTANEOUS | Status: DC
  Administered 2020-09-18 – 2020-09-23 (×6): 6 via TOPICAL

## 2020-09-18 MED ORDER — ACETAMINOPHEN 325 MG PO TABS: 650.0000 mg | ORAL_TABLET | Freq: Four times a day (QID) | ORAL | Status: DC | PRN

## 2020-09-18 MED ORDER — DOCUSATE SODIUM 100 MG PO CAPS
100.0000 mg | ORAL_CAPSULE | Freq: Two times a day (BID) | ORAL | Status: DC
  Administered 2020-09-18 – 2020-09-23 (×9): 100 mg via ORAL
  Filled 2020-09-18 (×10): qty 1

## 2020-09-18 MED ORDER — MAGIC MOUTHWASH
5.0000 mL | Freq: Three times a day (TID) | ORAL | Status: DC | PRN
  Filled 2020-09-18: qty 5

## 2020-09-18 MED ORDER — IPRATROPIUM-ALBUTEROL 20-100 MCG/ACT IN AERS
1.0000 | INHALATION_SPRAY | Freq: Three times a day (TID) | RESPIRATORY_TRACT | Status: DC
  Administered 2020-09-19 – 2020-09-23 (×12): 1 via RESPIRATORY_TRACT
  Filled 2020-09-18: qty 4

## 2020-09-18 MED ORDER — MEGESTROL ACETATE 400 MG/10ML PO SUSP
800.0000 mg | Freq: Every day | ORAL | Status: DC
  Administered 2020-09-19 – 2020-09-23 (×5): 800 mg via ORAL
  Filled 2020-09-18 (×5): qty 20

## 2020-09-18 NOTE — Progress Notes (Signed)
Progress Note    Albert Flowers  NIO:[id number] DOB: April 08, 1970  DOA: 09/17/2020 PCP: [name], MD    Brief Narrative:     Medical records reviewed and are as summarized below:  Albert Flowers is an 50 y.o. 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  Assessment/Plan:   Active Problems:   Community acquired pneumonia of right middle lobe of lung   Pneumonia -recent COVID-19 -CT Scan: Rapid increase in large RIGHT LOWER lobe masslike consolidation since 81/82/9937 almost certainly related to infection/pneumonia given significant change over 2 days. Component of underlying malignancy is not excluded however. New small RIGHT pleural effusion. -Concerning with immune suppression with ongoing chemotherapy, cover resistant organism, continue current regimen cefepime plus azithromycin. -Check sputum culture, Legionella, mycoplasma and strep antigen. -Other supporting therapy, Tessalon, breathing treatment -added oncology to treatment plan due to metastatic cancer   Elevated glucose -Probably secondary to oncology related steroid use -SSI   Hyponatremia -follow   Anemia -was due for transfusion prior to admission  -transfuse if lower tomorrow    Severe protein calorie malnutrition/cachexia -Ensure       Family Communication/Anticipated D/C date and plan/Code Status    Code Status: Full Code.  Family Communication: wife at bedside Disposition Plan: Status is: Inpatient    Dispo: The patient is from: Home              Anticipated d/c is to: Home              Patient currently is not medically stable to d/c.   Difficult to place patient No          Medical Consultants:   Added oncology to treatment team   Subjective:   Breathing improved  Objective:    Vitals:   09/17/20 2200 09/17/20 2315 09/18/20 0429 09/18/20 0744  BP: 102/84 105/64 125/73 109/72  Pulse:  89 (!) 105 100  Resp:  20 20 19   Temp: 98.5 F (36.9 C)  97.8 F (36.6 C) 98.2 F (36.8 C)  TempSrc: Oral  Oral Oral  SpO2:  96% 97% 97%  Weight:      Height:        Intake/Output Summary (Last 24 hours) at 09/18/2020 1333 Last data filed at 09/18/2020 0745 Gross per 24 hour  Intake 2300 ml  Output 1800 ml  Net 500 ml   Filed Weights   09/17/20 1316  Weight: 65.8 kg    Exam:  General: Appearance:    Well developed, well nourished male in no acute distress     Lungs:     respirations unlabored, diminished on right side  Heart:    Tachycardic. Normal rhythm. No murmurs, rubs, or gallops.   MS:   All extremities are intact.    Neurologic:   Awake, alert, oriented x 3. No apparent focal neurological           defect.      Data Reviewed:   I have personally reviewed following labs and imaging studies:  Labs: Labs show the following:   Basic Metabolic Panel: Recent Labs  Lab 09/12/20 1351 09/16/20 1118 09/17/20 1204 09/18/20 0040  NA 136 134* 130* 129*  K 4.4 3.8 4.2 3.8  CL 102 97* 95* 99  CO2 25 27 26 24   GLUCOSE 224* 320* 208* 219*  BUN 11 16 12 8   CREATININE 0.67 0.75 0.69 0.54*  CALCIUM 8.6* 9.2 8.8* 8.3*   GFR Estimated Creatinine Clearance: 101.7 mL/min (A) (by C-G formula based on SCr of 0.54 mg/dL (L)). Liver Function Tests: Recent Labs  Lab 09/12/20 1351 09/16/20 1118  AST 15 27  ALT 12 41  ALKPHOS 119 140*  BILITOT <0.2* 0.2*  PROT 6.5 7.1  ALBUMIN 2.5* 2.8*   No results for input(s): LIPASE, AMYLASE in the last 168 hours. No results for input(s): AMMONIA in the last 168 hours. Coagulation profile Recent Labs  Lab 09/17/20 1346  INR 1.2    CBC: Recent Labs  Lab 09/12/20 1351 09/16/20 1118  09/17/20 1204 09/18/20 0040  WBC 4.9 7.7 10.5 8.0  NEUTROABS 3.4 6.5  --   --   HGB 7.7* 8.6* 9.2* 7.4*  HCT 22.8* 25.3* 27.2* 22.5*  MCV 96.6 95.1 98.2 97.4  PLT 130* 170 204 168   Cardiac Enzymes: No results for input(s): CKTOTAL, CKMB, CKMBINDEX, TROPONINI in the last 168 hours. BNP (last 3 results) No results for input(s): PROBNP in the last 8760 hours. CBG: Recent Labs  Lab 09/14/20 1304 09/14/20 1306 09/14/20 1338 09/15/20 0737 09/18/20 1300  GLUCAP 330* 315* 321* 136* 167*   D-Dimer: No results for input(s): DDIMER in the last 72 hours. Hgb A1c: Recent Labs    09/18/20 0040  HGBA1C 7.7*   Lipid Profile: No results for input(s): CHOL, HDL, LDLCALC, TRIG, CHOLHDL, LDLDIRECT in the last 72 hours. Thyroid function studies: Recent Labs    09/16/20 1117 09/16/20 1118  TSH 2.216  --   T4TOTAL  --  4.9   Anemia work up: Recent Labs    09/18/20 0040  TIBC 172*  IRON 16*  RETICCTPCT 2.2   Sepsis Labs: Recent Labs  Lab 09/12/20 1351 09/16/20 1118 09/17/20 1204 09/17/20 1345 09/18/20 0040  WBC 4.9 7.7 10.5  --  8.0  LATICACIDVEN  --   --   --  1.1 1.4    Microbiology Recent Results (from the past 240 hour(s))  Blood Culture (routine x 2)     Status: None (Preliminary result)   Collection Time: 09/17/20  2:40 PM   Specimen: BLOOD  Result Value Ref Range Status   Specimen Description BLOOD PORTA CATH  Final   Special Requests   Final    BOTTLES DRAWN AEROBIC AND ANAEROBIC Blood Culture results may not be optimal due to an inadequate volume of blood received in culture bottles   Culture   Final    NO GROWTH < 24 HOURS Performed at [facility], [address], [place], [place] [address]    Report Status PENDING  Incomplete  MRSA Next Gen by PCR, Nasal     Status: None   Collection Time: 09/17/20  2:41 PM   Specimen: Nasopharyngeal Swab; Nasal Swab  Result Value Ref Range Status   MRSA by PCR Next Gen NOT DETECTED NOT DETECTED Final     Comment: (NOTE) The GeneXpert MRSA Assay (FDA approved for NASAL specimens only), is one component of a comprehensive MRSA colonization surveillance program. It is not intended to diagnose MRSA infection nor to guide or monitor treatment for MRSA infections. Test performance is not FDA approved in patients less than 34 years old. Performed at [facility], [place] [address] [place], [place] [address]   Blood  Culture (routine x 2)     Status: None (Preliminary result)   Collection Time: 09/17/20  3:00 PM   Specimen: BLOOD  Result Value Ref Range Status   Specimen Description BLOOD RIGHT ANTECUBITAL  Final   Special Requests   Final    BOTTLES DRAWN AEROBIC AND ANAEROBIC Blood Culture results may not be optimal due to an inadequate volume of blood received in culture bottles   Culture   Final    NO GROWTH < 24 HOURS Performed at [facility], [place] [address], [place], [place] [address]    Report Status PENDING  Incomplete  Resp Panel by RT-PCR (Flu A&B, Covid) Nasopharyngeal Swab     Status: Abnormal   Collection Time: 09/17/20  3:15 PM   Specimen: Nasopharyngeal Swab; Nasopharyngeal(NP) swabs in vial transport medium  Result Value Ref Range Status   SARS Coronavirus 2 by RT PCR POSITIVE (A) NEGATIVE Final    Comment: RESULT CALLED TO, READ BACK BY AND VERIFIED WITH: RN J.COOK ON [id number] AT [id number] BY E.PARRISH (NOTE) SARS-CoV-2 target nucleic acids are DETECTED.  The SARS-CoV-2 RNA is generally detectable in upper respiratory specimens during the acute phase of infection. Positive results are indicative of the presence of the identified virus, but do not rule out bacterial infection or co-infection with other pathogens not detected by the test. Clinical correlation with patient history and other diagnostic information is necessary to determine patient infection status. The expected result is Negative.  Fact Sheet for  Patients: EntrepreneurPulse.com.au  Fact Sheet for Healthcare Providers: IncredibleEmployment.be  This test is not yet approved or cleared by the [place] FDA and  has been authorized for detection and/or diagnosis of SARS-CoV-2 by FDA under an Emergency Use Authorization (EUA).  This EUA will remain in effect (meaning this te st can be used) for the duration of  the COVID-19 declaration under Section 564(b)(1) of the Act, 21 U.S.C. section 360bbb-3(b)(1), unless the authorization is terminated or revoked sooner.     Influenza A by PCR NEGATIVE NEGATIVE Final   Influenza B by PCR NEGATIVE NEGATIVE Final    Comment: (NOTE) The Xpert Xpress SARS-CoV-2/FLU/RSV plus assay is intended as an aid in the diagnosis of influenza from Nasopharyngeal swab specimens and should not be used as a sole basis for treatment. Nasal washings and aspirates are unacceptable for Xpert Xpress SARS-CoV-2/FLU/RSV testing.  Fact Sheet for Patients: EntrepreneurPulse.com.au  Fact Sheet for Healthcare Providers: IncredibleEmployment.be  This test is not yet approved or cleared by the [place] FDA and has been authorized for detection and/or diagnosis of SARS-CoV-2 by FDA under an Emergency Use Authorization (EUA). This EUA will remain in effect (meaning this test can be used) for the duration of the COVID-19 declaration under Section 564(b)(1) of the Act, 21 U.S.C. section 360bbb-3(b)(1), unless the authorization is terminated or revoked.  Performed at [facility], [place] [address] [place], [place] [address]     Procedures and diagnostic studies:  DG Chest 2 View  Result Date: 09/17/2020 CLINICAL DATA:  RIGHT chest pain with cough. History of head and neck cancer. EXAM: CHEST - 2 VIEW COMPARISON:  09/15/2020 PET-CT, 08/28/2020 chest radiograph and prior studies FINDINGS: RIGHT MEDIAL basilar opacity/consolidation  appears increased from recent studies. Cardiomediastinal silhouette is unchanged. No pleural effusion or pneumothorax identified. A RIGHT Port-A-Cath with tip overlying SUPERIOR cavoatrial junction is again noted. Sclerotic lesion involving anterior LEFT 4th rib is again noted. IMPRESSION: RIGHT MEDIAL basilar opacity/consolidation, increased from recent  studies and may represent infection, aspiration and/or atelectasis. No other significant change. Electronically Signed   By: Margarette Canada M.D.   On: 09/17/2020 12:27   CT Angio Chest PE W and/or Wo Contrast  Result Date: 09/17/2020 CLINICAL DATA:  50 year old male with cough, shortness of breath and RIGHT chest pain. EXAM: CT ANGIOGRAPHY CHEST WITH CONTRAST TECHNIQUE: Multidetector CT imaging of the chest was performed using the standard protocol during bolus administration of intravenous contrast. Multiplanar CT image reconstructions and MIPs were obtained to evaluate the vascular anatomy. CONTRAST:  29mL OMNIPAQUE IOHEXOL 350 MG/ML SOLN COMPARISON:  09/15/2020 PET CT FINDINGS: Cardiovascular: Satisfactory opacification of the pulmonary arteries to the segmental level. No evidence of pulmonary embolism. Normal heart size. No pericardial effusion. Mediastinum/Nodes: No significant change. Enlarged RIGHT hilar lymph nodes are identified. Lungs/Pleura: Masslike consolidation within the RIGHT LOWER lobe now measures 10.3 x 5.1 cm, previously 2.7 x 2.5 cm on 09/15/2020 study. A 12 mm RIGHT middle lobe nodule is unchanged. A small RIGHT pleural effusion is now identified. No pneumothorax or LEFT pleural effusion. Upper Abdomen: No acute abnormality. Musculoskeletal: Sclerotic lesions within bilateral ribs, proximal RIGHT humerus, sternum and thoracic spine again noted Review of the MIP images confirms the above findings. IMPRESSION: 1. No evidence of pulmonary emboli. 2. Rapid increase in large RIGHT LOWER lobe masslike consolidation since 27/25/3664 almost certainly  related to infection/pneumonia given significant change over 2 days. Component of underlying malignancy is not excluded however. New small RIGHT pleural effusion. 3. Unchanged 12 mm RIGHT middle lobe nodule and sclerotic lesions within bilateral ribs, proximal RIGHT humerus, sternum and thoracic spine compatible with metastatic disease. Electronically Signed   By: Margarette Canada M.D.   On: 09/17/2020 17:02    Medications:    benzonatate  200 mg Oral TID   Chlorhexidine Gluconate Cloth  6 each Topical Daily   diphenhydrAMINE  25 mg Oral QHS   enoxaparin (LOVENOX) injection  40 mg Subcutaneous Q24H   feeding supplement  237 mL Oral BID BM   gabapentin  900 mg Oral TID   insulin aspart  0-15 Units Subcutaneous TID WC   Ipratropium-Albuterol  1 puff Inhalation Q6H   [START ON 09/19/2020] megestrol  800 mg Oral Daily   mirtazapine  30 mg Oral QHS   Continuous Infusions:  azithromycin Stopped (09/17/20 1653)   ceFEPime (MAXIPIME) IV 2 g (09/18/20 0631)     LOS: 1 day   Geradine Girt  [facility]   How to contact the [facility] Attending or Consulting provider [place] or covering provider during after hours [place], for this patient?  Check the care team in [facility] and look for a) attending/consulting [facility] provider listed and b) the [facility] team listed Log into [email or web address] and use [place]'s universal password to access. If you do not have the password, please contact the hospital operator. Locate the [facility] provider you are looking for under [facility] and page to a number that you can be directly reached. If you still have difficulty reaching the provider, please page the [facility] (Director on Call) for the Hospitalists listed on amion for assistance.  09/18/2020, 1:33 PM

## 2020-09-19 LAB — BASIC METABOLIC PANEL
Anion gap: 7 (ref 5–15)
BUN: 6 mg/dL (ref 6–20)
CO2: 25 mmol/L (ref 22–32)
Calcium: 8.7 mg/dL — ABNORMAL LOW (ref 8.9–10.3)
Chloride: 98 mmol/L (ref 98–111)
Creatinine, Ser: 0.55 mg/dL — ABNORMAL LOW (ref 0.61–1.24)
GFR, Estimated: >60 mL/min (ref >60)
Glucose, Bld: 179 mg/dL — ABNORMAL HIGH (ref 70–99)
Potassium: 4 mmol/L (ref 3.5–5.1)
Sodium: 130 mmol/L — ABNORMAL LOW (ref 135–145)

## 2020-09-19 LAB — URINE CULTURE: Culture: NO GROWTH

## 2020-09-19 LAB — CBC
HCT: 24.5 % — ABNORMAL LOW (ref 39.0–52.0)
Hemoglobin: 8.2 g/dL — ABNORMAL LOW (ref 13.0–17.0)
MCH: 32.7 pg (ref 26.0–34.0)
MCHC: 33.5 g/dL (ref 30.0–36.0)
MCV: 97.6 fL (ref 80.0–100.0)
Platelets: 177 10*3/uL (ref 150–400)
RBC: 2.51 MIL/uL — ABNORMAL LOW (ref 4.22–5.81)
RDW: 16.3 % — ABNORMAL HIGH (ref 11.5–15.5)
WBC: 8.9 10*3/uL (ref 4.0–10.5)
nRBC: 0.2 % (ref 0.0–0.2)

## 2020-09-19 LAB — GLUCOSE, CAPILLARY
Glucose-Capillary: 138 mg/dL — ABNORMAL HIGH (ref 70–99)
Glucose-Capillary: 242 mg/dL — ABNORMAL HIGH (ref 70–99)
Glucose-Capillary: 154 mg/dL — ABNORMAL HIGH (ref 70–99)
Glucose-Capillary: 248 mg/dL — ABNORMAL HIGH (ref 70–99)

## 2020-09-19 MED ORDER — BOOST PLUS PO LIQD
237.0000 mL | Freq: Two times a day (BID) | ORAL | Status: DC
  Administered 2020-09-19 – 2020-09-22 (×6): 237 mL via ORAL
  Filled 2020-09-19 (×2): qty 237
  Filled 2020-09-19: qty 474
  Filled 2020-09-19 (×6): qty 237

## 2020-09-19 MED ORDER — BOOST PLUS PO LIQD
237.0000 mL | Freq: Three times a day (TID) | ORAL | Status: DC
  Filled 2020-09-19: qty 237

## 2020-09-19 MED ORDER — INSULIN ASPART 100 UNIT/ML IJ SOLN
3.0000 [IU] | Freq: Three times a day (TID) | INTRAMUSCULAR | Status: DC
  Administered 2020-09-20 – 2020-09-23 (×9): 3 [IU] via SUBCUTANEOUS

## 2020-09-19 NOTE — Progress Notes (Addendum)
Progress Note    Shahan Starks  NTI:[id number] DOB: [birth year]  DOA: 09/17/2020 PCP: [name], MD    Brief Narrative:     Medical records reviewed and are as summarized below:  Albert Flowers is an 50 y.o. 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  Assessment/Plan:   Active Problems:   Community acquired pneumonia of right middle lobe of lung   Pneumonia -recent COVID-19 -CT Scan: Rapid increase in large RIGHT LOWER lobe masslike consolidation since 86/76/1950 almost certainly related to infection/pneumonia given significant change over 2 days. Component of underlying malignancy is not excluded however. New small RIGHT pleural effusion. -Concerning with immune suppression with ongoing chemotherapy, cover resistant organism, continue current regimen cefepime plus azithromycin. -Check sputum culture, Legionella, mycoplasma and strep antigen. -Other supporting therapy, Tessalon, breathing treatment -added oncology to treatment plan due to metastatic cancer- will discuss with them in the AM   Elevated glucose- HgbA1c now 7.7 -Probably secondary to oncology related steroid use -SSI   Hyponatremia -follow   Anemia -was due for transfusion prior to admission  -transfuse if lower tomorrow    Severe protein calorie malnutrition/cachexia -Ensure       Family Communication/Anticipated D/C date and plan/Code Status    Code Status: Full Code.  Family Communication: wife at bedside Disposition Plan: Status is: Inpatient    Dispo: The patient is from: Home              Anticipated d/c is to: Home              Patient currently is not medically stable to  d/c.   Difficult to place patient No         Medical Consultants:   Added oncology to treatment team- will call in the AM   Subjective:   No complaints  Objective:    Vitals:   09/18/20 2328 09/19/20 0346 09/19/20 0745 09/19/20 0918  BP: 109/68 108/68 90/64   Pulse: 90 90 95   Resp: 20 20 19    Temp: 98 F (36.7 C) 98.2 F (36.8 C) 98.3 F (36.8 C)   TempSrc: Oral Oral Oral   SpO2: 94% 96% 97% 99%  Weight:  61.5 kg    Height:        Intake/Output Summary (Last 24 hours) at 09/19/2020 1155 Last data filed at 09/19/2020 0502 Gross per 24 hour  Intake 350 ml  Output 1400 ml  Net -1050 ml   Filed Weights   09/17/20 1316 09/19/20 0346  Weight: 65.8 kg 61.5 kg    Exam:   General: Appearance:    Thin male in no acute distress     Lungs:     respirations unlabored, diminished right side  Heart:    Normal heart rate.     MS:   All extremities are intact.    Neurologic:   Awake, alert, oriented x 3. No apparent focal neurological           defect.        Data Reviewed:   I have personally reviewed following labs and imaging studies:  Labs: Labs show the following:   Basic Metabolic Panel: Recent Labs  Lab 09/12/20 1351 09/16/20 1118 09/17/20 1204  09/18/20 0040 09/19/20 0042  NA 136 134* 130* 129* 130*  K 4.4 3.8 4.2 3.8 4.0  CL 102 97* 95* 99 98  CO2 25 27 26 24 25   GLUCOSE 224* 320* 208* 219* 179*  BUN 11 16 12 8 6   CREATININE 0.67 0.75 0.69 0.54* 0.55*  CALCIUM 8.6* 9.2 8.8* 8.3* 8.7*   GFR Estimated Creatinine Clearance: 95 mL/min (A) (by C-G formula based on SCr of 0.55 mg/dL (L)). Liver Function Tests: Recent Labs  Lab 09/12/20 1351 09/16/20 1118  AST 15 27  ALT 12 41  ALKPHOS 119 140*  BILITOT <0.2* 0.2*  PROT 6.5 7.1  ALBUMIN 2.5* 2.8*   No results for input(s): LIPASE, AMYLASE in the last 168 hours. No results for input(s): AMMONIA in the last 168 hours. Coagulation profile Recent Labs  Lab 09/17/20 1346  INR 1.2     CBC: Recent Labs  Lab 09/12/20 1351 09/16/20 1118 09/17/20 1204 09/18/20 0040 09/19/20 0042  WBC 4.9 7.7 10.5 8.0 8.9  NEUTROABS 3.4 6.5  --   --   --   HGB 7.7* 8.6* 9.2* 7.4* 8.2*  HCT 22.8* 25.3* 27.2* 22.5* 24.5*  MCV 96.6 95.1 98.2 97.4 97.6  PLT 130* 170 204 168 177   Cardiac Enzymes: No results for input(s): CKTOTAL, CKMB, CKMBINDEX, TROPONINI in the last 168 hours. BNP (last 3 results) No results for input(s): PROBNP in the last 8760 hours. CBG: Recent Labs  Lab 09/15/20 0737 09/18/20 1300 09/18/20 1636 09/19/20 0747 09/19/20 1112  GLUCAP 136* 167* 137* 138* 242*   D-Dimer: No results for input(s): DDIMER in the last 72 hours. Hgb A1c: Recent Labs    09/18/20 0040  HGBA1C 7.7*   Lipid Profile: No results for input(s): CHOL, HDL, LDLCALC, TRIG, CHOLHDL, LDLDIRECT in the last 72 hours. Thyroid function studies: No results for input(s): TSH, T4TOTAL, T3FREE, THYROIDAB in the last 72 hours.  Invalid input(s): FREET3  Anemia work up: Recent Labs    09/18/20 0040  TIBC 172*  IRON 16*  RETICCTPCT 2.2   Sepsis Labs: Recent Labs  Lab 09/16/20 1118 09/17/20 1204 09/17/20 1345 09/18/20 0040 09/19/20 0042  WBC 7.7 10.5  --  8.0 8.9  LATICACIDVEN  --   --  1.1 1.4  --     Microbiology Recent Results (from the past 240 hour(s))  Blood Culture (routine x 2)     Status: None (Preliminary result)   Collection Time: 09/17/20  2:40 PM   Specimen: BLOOD  Result Value Ref Range Status   Specimen Description BLOOD PORTA CATH  Final   Special Requests   Final    BOTTLES DRAWN AEROBIC AND ANAEROBIC Blood Culture results may not be optimal due to an inadequate volume of blood received in culture bottles   Culture   Final    NO GROWTH 2 DAYS Performed at [facility], [place] [address] [place], [place] [address]    Report Status PENDING  Incomplete  MRSA Next Gen by PCR, Nasal     Status: None   Collection Time: 09/17/20  2:41 PM   Specimen:  Nasopharyngeal Swab; Nasal Swab  Result Value Ref Range Status   MRSA by PCR Next Gen NOT DETECTED NOT DETECTED Final    Comment: (NOTE) The GeneXpert MRSA Assay (FDA approved for NASAL specimens only), is one component of a comprehensive MRSA colonization surveillance program. It is not intended to diagnose MRSA infection nor to guide or monitor treatment for MRSA infections.  Test performance is not FDA approved in patients less than 71 years old. Performed at [facility], [place] [address], [place], [place] [address]   Blood Culture (routine x 2)     Status: None (Preliminary result)   Collection Time: 09/17/20  3:00 PM   Specimen: BLOOD  Result Value Ref Range Status   Specimen Description BLOOD RIGHT ANTECUBITAL  Final   Special Requests   Final    BOTTLES DRAWN AEROBIC AND ANAEROBIC Blood Culture results may not be optimal due to an inadequate volume of blood received in culture bottles   Culture   Final    NO GROWTH 2 DAYS Performed at [facility], [place] [address] [place], [place] [address]    Report Status PENDING  Incomplete  Resp Panel by RT-PCR (Flu A&B, Covid) Nasopharyngeal Swab     Status: Abnormal   Collection Time: 09/17/20  3:15 PM   Specimen: Nasopharyngeal Swab; Nasopharyngeal(NP) swabs in vial transport medium  Result Value Ref Range Status   SARS Coronavirus 2 by RT PCR POSITIVE (A) NEGATIVE Final    Comment: RESULT CALLED TO, READ BACK BY AND VERIFIED WITH: RN J.COOK ON [id number] AT [id number] BY E.PARRISH (NOTE) SARS-CoV-2 target nucleic acids are DETECTED.  The SARS-CoV-2 RNA is generally detectable in upper respiratory specimens during the acute phase of infection. Positive results are indicative of the presence of the identified virus, but do not rule out bacterial infection or co-infection with other pathogens not detected by the test. Clinical correlation with patient history and other diagnostic information is necessary to determine  patient infection status. The expected result is Negative.  Fact Sheet for Patients: EntrepreneurPulse.com.au  Fact Sheet for Healthcare Providers: IncredibleEmployment.be  This test is not yet approved or cleared by the [place] FDA and  has been authorized for detection and/or diagnosis of SARS-CoV-2 by FDA under an Emergency Use Authorization (EUA).  This EUA will remain in effect (meaning this te st can be used) for the duration of  the COVID-19 declaration under Section 564(b)(1) of the Act, 21 U.S.C. section 360bbb-3(b)(1), unless the authorization is terminated or revoked sooner.     Influenza A by PCR NEGATIVE NEGATIVE Final   Influenza B by PCR NEGATIVE NEGATIVE Final    Comment: (NOTE) The Xpert Xpress SARS-CoV-2/FLU/RSV plus assay is intended as an aid in the diagnosis of influenza from Nasopharyngeal swab specimens and should not be used as a sole basis for treatment. Nasal washings and aspirates are unacceptable for Xpert Xpress SARS-CoV-2/FLU/RSV testing.  Fact Sheet for Patients: EntrepreneurPulse.com.au  Fact Sheet for Healthcare Providers: IncredibleEmployment.be  This test is not yet approved or cleared by the [place] FDA and has been authorized for detection and/or diagnosis of SARS-CoV-2 by FDA under an Emergency Use Authorization (EUA). This EUA will remain in effect (meaning this test can be used) for the duration of the COVID-19 declaration under Section 564(b)(1) of the Act, 21 U.S.C. section 360bbb-3(b)(1), unless the authorization is terminated or revoked.  Performed at [facility], [place] [address] [place], [place] [address]   Urine culture     Status: None   Collection Time: 09/17/20  9:40 PM   Specimen: In/Out Cath Urine  Result Value Ref Range Status   Specimen Description IN/OUT CATH URINE  Final   Special Requests NONE  Final   Culture   Final    NO  GROWTH Performed at [facility], [place],  [place] [address]    Report Status 09/19/2020 FINAL  Final    Procedures and diagnostic studies:  DG Chest 2 View  Result Date: 09/17/2020 CLINICAL DATA:  RIGHT chest pain with cough. History of head and neck cancer. EXAM: CHEST - 2 VIEW COMPARISON:  09/15/2020 PET-CT, 08/28/2020 chest radiograph and prior studies FINDINGS: RIGHT MEDIAL basilar opacity/consolidation appears increased from recent studies. Cardiomediastinal silhouette is unchanged. No pleural effusion or pneumothorax identified. A RIGHT Port-A-Cath with tip overlying SUPERIOR cavoatrial junction is again noted. Sclerotic lesion involving anterior LEFT 4th rib is again noted. IMPRESSION: RIGHT MEDIAL basilar opacity/consolidation, increased from recent studies and may represent infection, aspiration and/or atelectasis. No other significant change. Electronically Signed   By: Margarette Canada M.D.   On: 09/17/2020 12:27   CT Angio Chest PE W and/or Wo Contrast  Result Date: 09/17/2020 CLINICAL DATA:  50 year old male with cough, shortness of breath and RIGHT chest pain. EXAM: CT ANGIOGRAPHY CHEST WITH CONTRAST TECHNIQUE: Multidetector CT imaging of the chest was performed using the standard protocol during bolus administration of intravenous contrast. Multiplanar CT image reconstructions and MIPs were obtained to evaluate the vascular anatomy. CONTRAST:  110mL OMNIPAQUE IOHEXOL 350 MG/ML SOLN COMPARISON:  09/15/2020 PET CT FINDINGS: Cardiovascular: Satisfactory opacification of the pulmonary arteries to the segmental level. No evidence of pulmonary embolism. Normal heart size. No pericardial effusion. Mediastinum/Nodes: No significant change. Enlarged RIGHT hilar lymph nodes are identified. Lungs/Pleura: Masslike consolidation within the RIGHT LOWER lobe now measures 10.3 x 5.1 cm, previously 2.7 x 2.5 cm on 09/15/2020 study. A 12 mm RIGHT middle lobe nodule is unchanged. A small  RIGHT pleural effusion is now identified. No pneumothorax or LEFT pleural effusion. Upper Abdomen: No acute abnormality. Musculoskeletal: Sclerotic lesions within bilateral ribs, proximal RIGHT humerus, sternum and thoracic spine again noted Review of the MIP images confirms the above findings. IMPRESSION: 1. No evidence of pulmonary emboli. 2. Rapid increase in large RIGHT LOWER lobe masslike consolidation since 61/95/0932 almost certainly related to infection/pneumonia given significant change over 2 days. Component of underlying malignancy is not excluded however. New small RIGHT pleural effusion. 3. Unchanged 12 mm RIGHT middle lobe nodule and sclerotic lesions within bilateral ribs, proximal RIGHT humerus, sternum and thoracic spine compatible with metastatic disease. Electronically Signed   By: Margarette Canada M.D.   On: 09/17/2020 17:02    Medications:    benzonatate  200 mg Oral TID   Chlorhexidine Gluconate Cloth  6 each Topical Daily   diphenhydrAMINE  25 mg Oral QHS   docusate sodium  100 mg Oral BID   enoxaparin (LOVENOX) injection  40 mg Subcutaneous Q24H   feeding supplement  237 mL Oral BID BM   gabapentin  900 mg Oral TID   insulin aspart  0-15 Units Subcutaneous TID WC   Ipratropium-Albuterol  1 puff Inhalation TID   magic mouthwash  15 mL Oral TID   megestrol  800 mg Oral Daily   mirtazapine  30 mg Oral QHS   Continuous Infusions:  azithromycin Stopped (09/18/20 1701)   ceFEPime (MAXIPIME) IV 2 g (09/19/20 0502)     LOS: 2 days   Geradine Girt  [facility]   How to contact the [facility] Attending or Consulting provider [place] or covering provider during after hours [place], for this patient?  Check the care team in [facility] and look for a) attending/consulting [facility] provider listed and b) the [facility] team listed Log into [email or web address] and use [place]'s universal  password to access. If you do not have the password, please contact the hospital operator. Locate the [facility] provider  you are looking for under [facility] and page to a number that you can be directly reached. If you still have difficulty reaching the provider, please page the [facility] (Director on Call) for the Hospitalists listed on amion for assistance.  09/19/2020, 11:55 AM

## 2020-09-20 ENCOUNTER — Other Ambulatory Visit: Payer: Managed Care, Other (non HMO)

## 2020-09-20 ENCOUNTER — Ambulatory Visit: Payer: Managed Care, Other (non HMO)

## 2020-09-20 DIAGNOSIS — R739 Hyperglycemia, unspecified: Secondary | ICD-10-CM

## 2020-09-20 LAB — GLUCOSE, CAPILLARY
Glucose-Capillary: 144 mg/dL — ABNORMAL HIGH (ref 70–99)
Glucose-Capillary: 250 mg/dL — ABNORMAL HIGH (ref 70–99)
Glucose-Capillary: 202 mg/dL — ABNORMAL HIGH (ref 70–99)
Glucose-Capillary: 242 mg/dL — ABNORMAL HIGH (ref 70–99)

## 2020-09-20 LAB — LEGIONELLA PNEUMOPHILA SEROGP 1 UR AG
L. pneumophila Serogp 1 Ur Ag: NEGATIVE
L. pneumophila Serogp 1 Ur Ag: NEGATIVE

## 2020-09-20 LAB — MYCOPLASMA PNEUMONIAE ANTIBODY, IGM: Mycoplasma pneumo IgM: <770 U/mL (ref 0–769)

## 2020-09-20 MED ORDER — POLYETHYLENE GLYCOL 3350 17 G PO PACK
17.0000 g | PACK | Freq: Every day | ORAL | Status: DC
  Administered 2020-09-20 – 2020-09-23 (×4): 17 g via ORAL
  Filled 2020-09-20 (×4): qty 1

## 2020-09-20 NOTE — Progress Notes (Signed)
Progress Note    Shafin Pollio  MAU:[id number] DOB: 1970/04/09  DOA: 09/17/2020 PCP: [name], MD    Brief Narrative:     Medical records reviewed and are as summarized below:  Aster Eckrich is an 50 y.o. 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 Found to have large right lower lobe mass-like consolidation.    Assessment/Plan:   Active Problems:   Community acquired pneumonia of right middle lobe of lung   Pneumonia -recent COVID-19 -CT Scan: Rapid increase in large RIGHT LOWER lobe masslike consolidation since 56/38/9373 almost certainly related to infection/pneumonia given significant change over 2 days. Component of underlying malignancy is not excluded however. New small RIGHT pleural effusion. -Concerning with immune suppression with ongoing chemotherapy, cover resistant organism, continue current regimen cefepime plus azithromycin. -Other supporting therapy, Tessalon, breathing treatment -added oncology to treatment plan due to metastatic cancer- await consult -repeat x ray in the AM   Elevated glucose- HgbA1c now 7.7 -Probably secondary to oncology related steroid use -SSI for now -will need carb mod diet and close follow up   Hyponatremia -follow   Anemia -was due for transfusion prior to admission  -transfuse if < 7   Severe protein calorie malnutrition/cachexia -Ensure       Family Communication/Anticipated D/C date and plan/Code Status    Code Status: Full Code.  Family Communication: wife at bedside Disposition Plan: Status is: Inpatient    Dispo: The patient is from: Home              Anticipated d/c is to: Home               Patient currently is not medically stable to d/c.   Difficult to place patient No         Medical Consultants:   Added oncology to treatment team- consult pending   Subjective:   Thinks breathing is better but still has pain  Objective:    Vitals:   09/20/20 0028 09/20/20 0351 09/20/20 0717 09/20/20 1113  BP: 107/70 107/75 91/61 94/67   Pulse: 91 80 87 100  Resp: 20 18 18 20   Temp: 98 F (36.7 C) 98.6 F (37 C) 98.5 F (36.9 C) 98.4 F (36.9 C)  TempSrc: Oral Oral Oral Oral  SpO2: 100% 100% 97% 98%  Weight:      Height:        Intake/Output Summary (Last 24 hours) at 09/20/2020 1344 Last data filed at 09/20/2020 0718 Gross per 24 hour  Intake 650 ml  Output 1557 ml  Net -907 ml   Filed Weights   09/17/20 1316 09/19/20 0346  Weight: 65.8 kg 61.5 kg    Exam:   General: Appearance:    Thin male chronically ill appearing     Lungs:     Diminished respirations unlabored  Heart:    Tachycardic.   MS:   All extremities are intact.    Neurologic:   Awake, alert, oriented x 3. No apparent focal neurological           defect.          Data Reviewed:   I have personally reviewed following labs and imaging studies:  Labs: Labs show the following:  Basic Metabolic Panel: Recent Labs  Lab 09/16/20 1118 09/17/20 1204 09/18/20 0040 09/19/20 0042  NA 134* 130* 129* 130*  K 3.8 4.2 3.8 4.0  CL 97* 95* 99 98  CO2 27 26 24 25   GLUCOSE 320* 208* 219* 179*  BUN 16 12 8 6   CREATININE 0.75 0.69 0.54* 0.55*  CALCIUM 9.2 8.8* 8.3* 8.7*   GFR Estimated Creatinine Clearance: 95 mL/min (A) (by C-G formula based on SCr of 0.55 mg/dL (L)). Liver Function Tests: Recent Labs  Lab 09/16/20 1118  AST 27  ALT 41  ALKPHOS 140*  BILITOT 0.2*  PROT 7.1  ALBUMIN 2.8*   No results for input(s): LIPASE, AMYLASE in the last 168 hours. No results for input(s): AMMONIA in the last 168 hours. Coagulation profile Recent Labs  Lab 09/17/20 1346  INR 1.2     CBC: Recent Labs  Lab 09/16/20 1118 09/17/20 1204 09/18/20 0040 09/19/20 0042  WBC 7.7 10.5 8.0 8.9  NEUTROABS 6.5  --   --   --   HGB 8.6* 9.2* 7.4* 8.2*  HCT 25.3* 27.2* 22.5* 24.5*  MCV 95.1 98.2 97.4 97.6  PLT 170 204 168 177   Cardiac Enzymes: No results for input(s): CKTOTAL, CKMB, CKMBINDEX, TROPONINI in the last 168 hours. BNP (last 3 results) No results for input(s): PROBNP in the last 8760 hours. CBG: Recent Labs  Lab 09/19/20 1112 09/19/20 1626 09/19/20 2042 09/20/20 0559 09/20/20 1137  GLUCAP 242* 154* 248* 144* 250*   D-Dimer: No results for input(s): DDIMER in the last 72 hours. Hgb A1c: Recent Labs    09/18/20 0040  HGBA1C 7.7*   Lipid Profile: No results for input(s): CHOL, HDL, LDLCALC, TRIG, CHOLHDL, LDLDIRECT in the last 72 hours. Thyroid function studies: No results for input(s): TSH, T4TOTAL, T3FREE, THYROIDAB in the last 72 hours.  Invalid input(s): FREET3  Anemia work up: Recent Labs    09/18/20 0040  TIBC 172*  IRON 16*  RETICCTPCT 2.2   Sepsis Labs: Recent Labs  Lab 09/16/20 1118 09/17/20 1204 09/17/20 1345 09/18/20 0040 09/19/20 0042  WBC 7.7 10.5  --  8.0 8.9  LATICACIDVEN  --   --  1.1 1.4  --     Microbiology Recent Results (from the past 240 hour(s))  Blood Culture (routine x 2)     Status: None (Preliminary result)   Collection Time: 09/17/20  2:40 PM   Specimen: BLOOD  Result Value Ref Range Status   Specimen Description BLOOD PORTA CATH  Final   Special Requests   Final    BOTTLES DRAWN AEROBIC AND ANAEROBIC Blood Culture results may not be optimal due to an inadequate volume of blood received in culture bottles   Culture   Final    NO GROWTH 3 DAYS Performed at [facility], [place] [address], [place], [place] [address]    Report Status PENDING  Incomplete  MRSA Next Gen by PCR, Nasal     Status: None   Collection Time: 09/17/20  2:41 PM   Specimen: Nasopharyngeal Swab; Nasal Swab  Result Value  Ref Range Status   MRSA by PCR Next Gen NOT DETECTED NOT DETECTED Final    Comment: (NOTE) The GeneXpert MRSA Assay (FDA approved for NASAL specimens only), is one component of a comprehensive MRSA colonization surveillance program. It is not intended to diagnose MRSA infection nor to guide or monitor treatment for MRSA infections. Test performance is not FDA approved in patients less than 81 years old.  Performed at [facility], [place] [address] [place], [place] [address]   Blood Culture (routine x 2)     Status: None (Preliminary result)   Collection Time: 09/17/20  3:00 PM   Specimen: BLOOD  Result Value Ref Range Status   Specimen Description BLOOD RIGHT ANTECUBITAL  Final   Special Requests   Final    BOTTLES DRAWN AEROBIC AND ANAEROBIC Blood Culture results may not be optimal due to an inadequate volume of blood received in culture bottles   Culture   Final    NO GROWTH 3 DAYS Performed at [facility], [place] [address], [place], [place] [address]    Report Status PENDING  Incomplete  Resp Panel by RT-PCR (Flu A&B, Covid) Nasopharyngeal Swab     Status: Abnormal   Collection Time: 09/17/20  3:15 PM   Specimen: Nasopharyngeal Swab; Nasopharyngeal(NP) swabs in vial transport medium  Result Value Ref Range Status   SARS Coronavirus 2 by RT PCR POSITIVE (A) NEGATIVE Final    Comment: RESULT CALLED TO, READ BACK BY AND VERIFIED WITH: RN J.COOK ON [id number] AT 0350 BY E.PARRISH (NOTE) SARS-CoV-2 target nucleic acids are DETECTED.  The SARS-CoV-2 RNA is generally detectable in upper respiratory specimens during the acute phase of infection. Positive results are indicative of the presence of the identified virus, but do not rule out bacterial infection or co-infection with other pathogens not detected by the test. Clinical correlation with patient history and other diagnostic information is necessary to determine patient infection status. The expected result is  Negative.  Fact Sheet for Patients: EntrepreneurPulse.com.au  Fact Sheet for Healthcare Providers: IncredibleEmployment.be  This test is not yet approved or cleared by the [place] FDA and  has been authorized for detection and/or diagnosis of SARS-CoV-2 by FDA under an Emergency Use Authorization (EUA).  This EUA will remain in effect (meaning this te st can be used) for the duration of  the COVID-19 declaration under Section 564(b)(1) of the Act, 21 U.S.C. section 360bbb-3(b)(1), unless the authorization is terminated or revoked sooner.     Influenza A by PCR NEGATIVE NEGATIVE Final   Influenza B by PCR NEGATIVE NEGATIVE Final    Comment: (NOTE) The Xpert Xpress SARS-CoV-2/FLU/RSV plus assay is intended as an aid in the diagnosis of influenza from Nasopharyngeal swab specimens and should not be used as a sole basis for treatment. Nasal washings and aspirates are unacceptable for Xpert Xpress SARS-CoV-2/FLU/RSV testing.  Fact Sheet for Patients: EntrepreneurPulse.com.au  Fact Sheet for Healthcare Providers: IncredibleEmployment.be  This test is not yet approved or cleared by the [place] FDA and has been authorized for detection and/or diagnosis of SARS-CoV-2 by FDA under an Emergency Use Authorization (EUA). This EUA will remain in effect (meaning this test can be used) for the duration of the COVID-19 declaration under Section 564(b)(1) of the Act, 21 U.S.C. section 360bbb-3(b)(1), unless the authorization is terminated or revoked.  Performed at [facility], [place] [address] [place], [place] [address]   Urine culture     Status: None   Collection Time: 09/17/20  9:40 PM   Specimen: In/Out Cath Urine  Result Value Ref Range Status   Specimen Description IN/OUT CATH URINE  Final   Special Requests NONE  Final   Culture   Final    NO GROWTH Performed at [facility],  [place] [address] [place], [place] [address]    Report Status 09/19/2020 FINAL  Final  Procedures and diagnostic studies:  No results found.  Medications:    benzonatate  200 mg Oral TID   Chlorhexidine Gluconate Cloth  6 each Topical Daily   diphenhydrAMINE  25 mg Oral QHS   docusate sodium  100 mg Oral BID   enoxaparin (LOVENOX) injection  40 mg Subcutaneous Q24H   gabapentin  900 mg Oral TID   insulin aspart  0-15 Units Subcutaneous TID WC   insulin aspart  3 Units Subcutaneous TID WC   Ipratropium-Albuterol  1 puff Inhalation TID   lactose free nutrition  237 mL Oral BID BM   magic mouthwash  15 mL Oral TID   megestrol  800 mg Oral Daily   mirtazapine  30 mg Oral QHS   Continuous Infusions:  azithromycin 500 mg (09/20/20 1111)   ceFEPime (MAXIPIME) IV 2 g (09/20/20 1333)     LOS: 3 days   Geradine Girt  [facility]   How to contact the [facility] Attending or Consulting provider [place] or covering provider during after hours [place], for this patient?  Check the care team in [facility] and look for a) attending/consulting TR[facility] provider listed and b) the [facility] team listed Log into [email or web address] and use [place]'s universal password to access. If you do not have the password, please contact the hospital operator. Locate the [facility] provider you are looking for under [facility] and page to a number that you can be directly reached. If you still have difficulty reaching the provider, please page the [facility]: Lambert Rhonda W (Director on Call) for the Hospitalists listed on amion for assistance.  09/20/2020, 1:44 PM

## 2020-09-21 ENCOUNTER — Inpatient Hospital Stay (HOSPITAL_COMMUNITY): Payer: Managed Care, Other (non HMO)

## 2020-09-21 ENCOUNTER — Encounter: Payer: Self-pay | Admitting: Hematology and Oncology

## 2020-09-21 DIAGNOSIS — C787 Secondary malignant neoplasm of liver and intrahepatic bile duct: Secondary | ICD-10-CM

## 2020-09-21 DIAGNOSIS — C760 Malignant neoplasm of head, face and neck: Secondary | ICD-10-CM

## 2020-09-21 DIAGNOSIS — J181 Lobar pneumonia, unspecified organism: Secondary | ICD-10-CM

## 2020-09-21 DIAGNOSIS — C109 Malignant neoplasm of oropharynx, unspecified: Secondary | ICD-10-CM

## 2020-09-21 DIAGNOSIS — R0602 Shortness of breath: Secondary | ICD-10-CM

## 2020-09-21 DIAGNOSIS — C7951 Secondary malignant neoplasm of bone: Secondary | ICD-10-CM

## 2020-09-21 DIAGNOSIS — M25511 Pain in right shoulder: Secondary | ICD-10-CM

## 2020-09-21 DIAGNOSIS — R079 Chest pain, unspecified: Secondary | ICD-10-CM

## 2020-09-21 LAB — CBC
HCT: 22 % — ABNORMAL LOW (ref 39.0–52.0)
Hemoglobin: 7.5 g/dL — ABNORMAL LOW (ref 13.0–17.0)
MCH: 32.8 pg (ref 26.0–34.0)
MCHC: 34.1 g/dL (ref 30.0–36.0)
MCV: 96.1 fL (ref 80.0–100.0)
Platelets: 168 10*3/uL (ref 150–400)
RBC: 2.29 MIL/uL — ABNORMAL LOW (ref 4.22–5.81)
RDW: 16.1 % — ABNORMAL HIGH (ref 11.5–15.5)
WBC: 5.4 10*3/uL (ref 4.0–10.5)
nRBC: 0 % (ref 0.0–0.2)

## 2020-09-21 LAB — BASIC METABOLIC PANEL
Anion gap: 7 (ref 5–15)
BUN: 6 mg/dL (ref 6–20)
CO2: 24 mmol/L (ref 22–32)
Calcium: 8.9 mg/dL (ref 8.9–10.3)
Chloride: 101 mmol/L (ref 98–111)
Creatinine, Ser: 0.47 mg/dL — ABNORMAL LOW (ref 0.61–1.24)
GFR, Estimated: >60 mL/min (ref >60)
Glucose, Bld: 213 mg/dL — ABNORMAL HIGH (ref 70–99)
Potassium: 4 mmol/L (ref 3.5–5.1)
Sodium: 132 mmol/L — ABNORMAL LOW (ref 135–145)

## 2020-09-21 LAB — GLUCOSE, CAPILLARY
Glucose-Capillary: 157 mg/dL — ABNORMAL HIGH (ref 70–99)
Glucose-Capillary: 227 mg/dL — ABNORMAL HIGH (ref 70–99)
Glucose-Capillary: 259 mg/dL — ABNORMAL HIGH (ref 70–99)
Glucose-Capillary: 334 mg/dL — ABNORMAL HIGH (ref 70–99)

## 2020-09-21 IMAGING — DX DG CHEST 2V
2 series · 2 of 2 positions shown · non-contrast
Comparison: [DATE]

CLINICAL DATA: 50-year-old male with shortness of breath, weakness.
History of head neck cancer.

EXAM:
CHEST - 2 VIEW

[x chest ap]
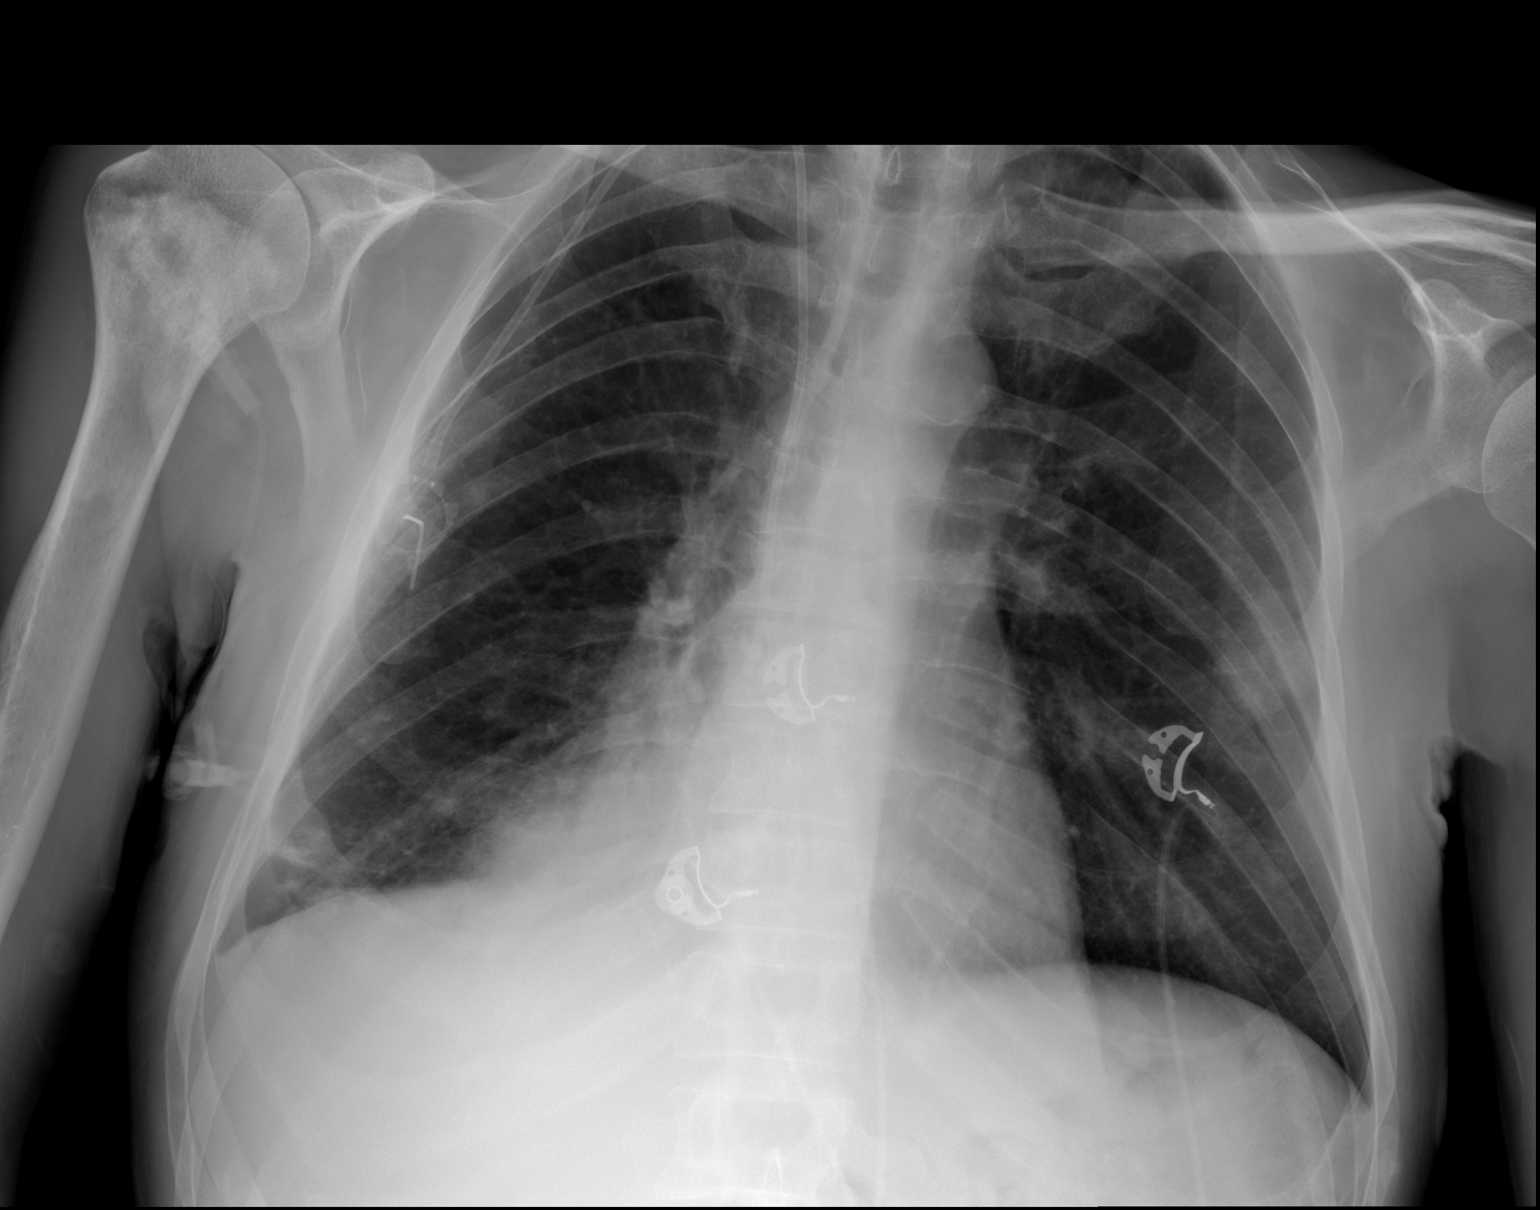

[w chest lat]
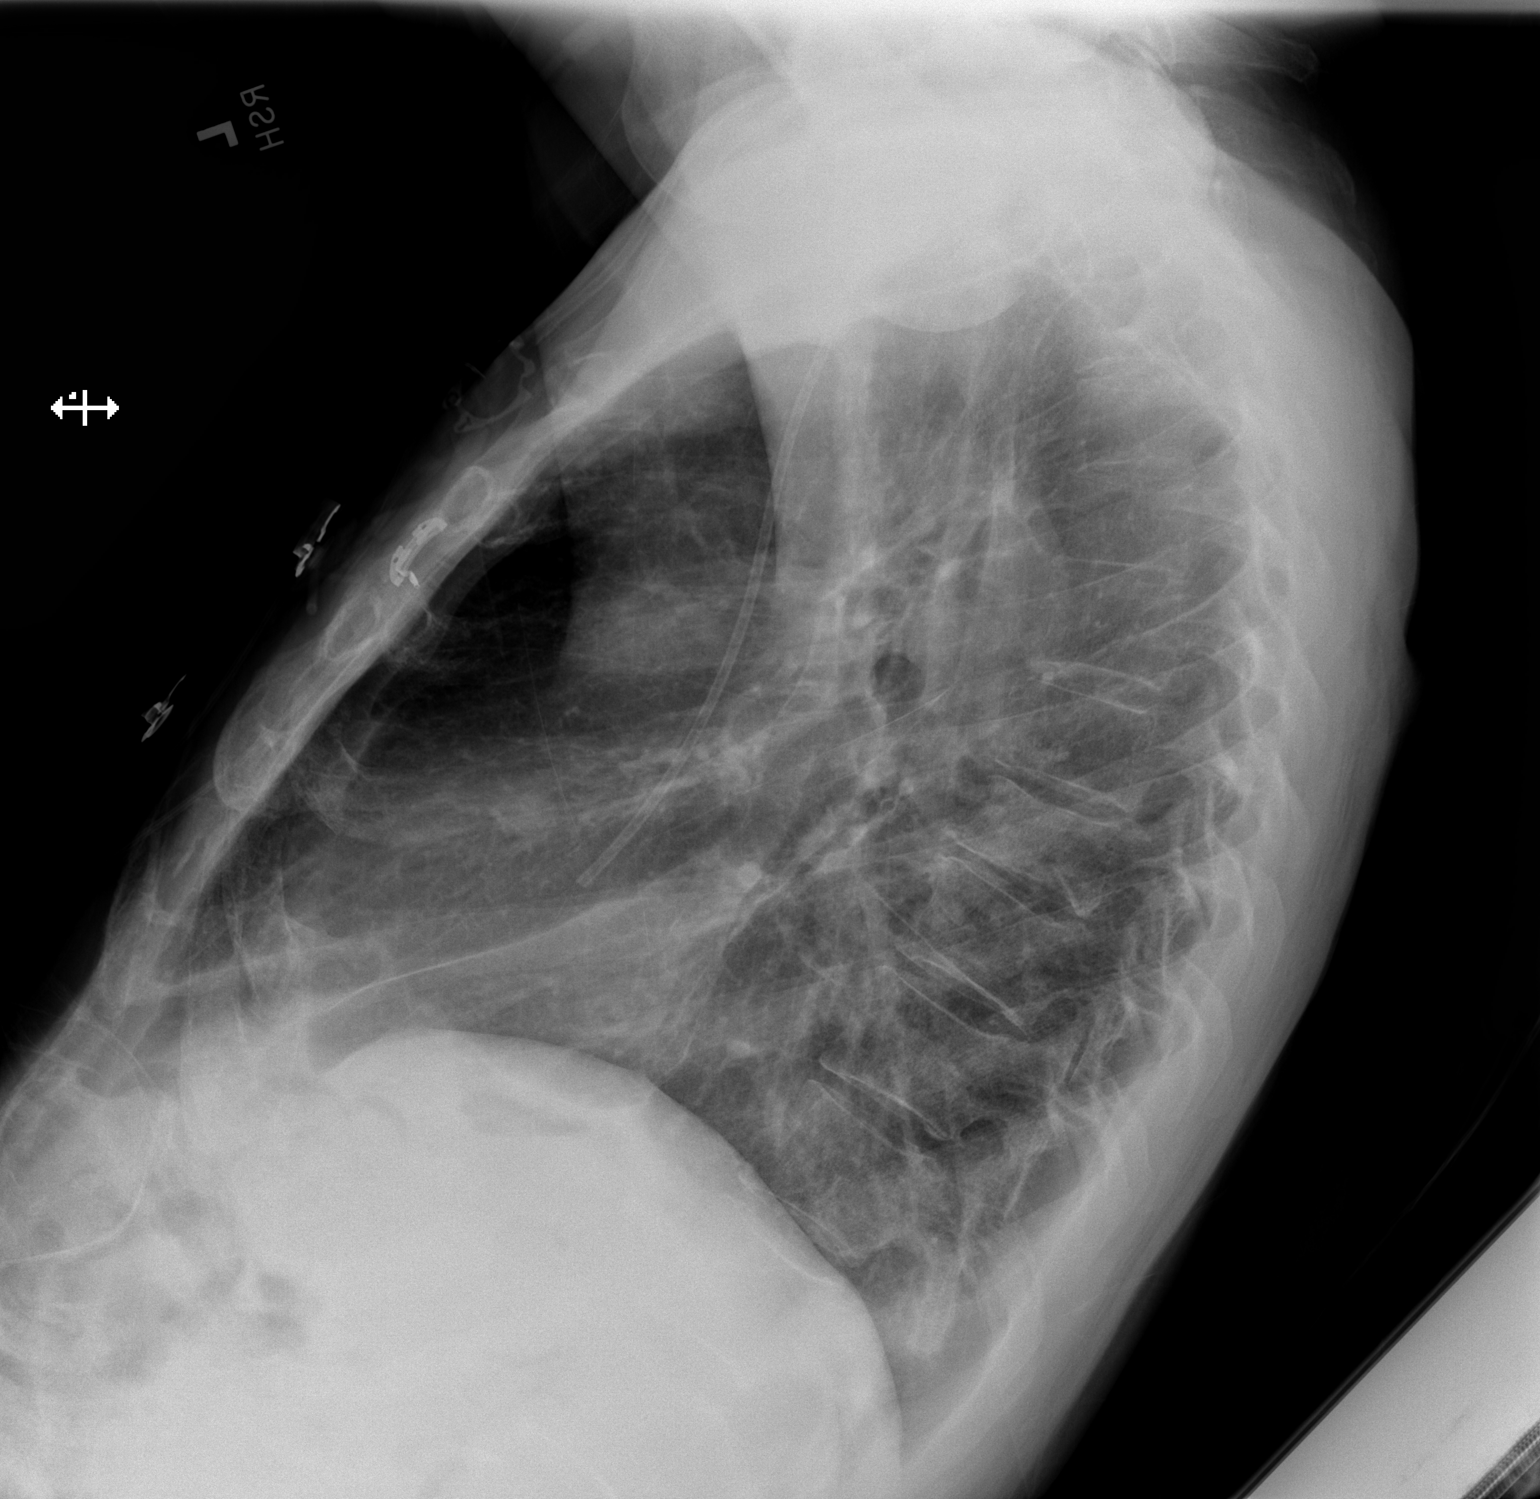

[2 of 2 positions shown; findings below may reference images not displayed]

FINDINGS: Right chest wall Port-A-Cath in place with catheter tip near the
cavoatrial junction, unchanged. The mediastinal contours are within
normal limits. No cardiomegaly. Similar appearing right lower lobe
perihilar opacity. Interval development of streaky peripheral
opacities in the right lung base. The left lung is clear. No pleural
effusion or pneumothorax. Similar appearing sclerotic opacity in the
proximal right humerus.
IMPRESSION: 1. Similar appearing right lower lobe perihilar masslike
opacification with development of right lower lobe peripheral
subsegmental atelectasis. Query sequela of aspiration, persistent
pneumonia, or pulmonary neoplasm.
2. Similar appearing proximal right humeral osseous metastasis.

## 2020-09-21 NOTE — Consult Note (Signed)
[place]  [phone] Fax:([phone]) [place]    Referral MD  Reason for Referral: Dr Eliseo Squires  No chief complaint on file. Pleuritic chest pain, SOB  HPI:   This is a very well known patient to me 50 year old male patient with metastatic HPV positive squamous cell carcinoma currently on treatment with caroplatin and Keytruda ( received 2 cycles of 5 FU with severe mucositis despite dose reduction) now admitted with SOB, right flank pain. He recently had COVID and was briefly hospitalized, no complication. He complained of worsening SOB, right shoulder pain and right flank pain or lower posterior chest pain to the wife on Saturday hence came to the hospital. He had a CT PE protocol which showed Rapid increase in large RIGHT LOWER lobe masslike consolidation since 92/42/6834 almost certainly related to infection/pneumonia given significant change over 2 days. Component of underlying malignancy is not excluded however. I got a message from Dr Eliseo Squires yesterday, responded to her and we discussed that this is likely pneumonia, he is clinically improving, Medical oncology was consulted for additional recommendations.  Today, he was not on oxygen, was comfortably having a conversation,  Vitals are stable while standing No other complaints for me. No major mucositis. Appetite is better, weight is better Rest of the pertinent 10 point ROS reviewed and negative.   Past Medical History:  Diagnosis Date   Diabetes mellitus without complication ([place])    Malignant neoplasm of base of tongue ([place]) 06/02/2018  :   Past Surgical History:  Procedure Laterality Date   IR IMAGING GUIDED PORT INSERTION  07/15/2020   left neck dissection Left 05/04/2019   Left Neck Dissection by Dr. [name] at [facility].    neck sugery     as a child "had a knot removed from neck" not sure which side.    RIght neck dissection Right 06/16/2018    TORS and right neck dissection. Dr. [name] at [facility]  :   Current Facility-Administered Medications  Medication Dose Route Frequency Provider Last Rate Last Admin   acetaminophen (TYLENOL) tablet 650 mg  650 mg Oral Q6H PRN Eulogio Bear U, DO       azithromycin (ZITHROMAX) 500 mg in sodium chloride 0.9 % 250 mL IVPB  500 mg Intravenous Q24H Domenic Moras, PA-C 250 mL/hr at 09/21/20 0935 500 mg at 09/21/20 0935   benzonatate (TESSALON) capsule 200 mg  200 mg Oral TID Wynetta Fines T, MD   200 mg at 09/21/20 1626   ceFEPIme (MAXIPIME) 2 g in sodium chloride 0.9 % 100 mL IVPB  2 g Intravenous Q8H Domenic Moras, PA-C 200 mL/hr at 09/21/20 1209 2 g at 09/21/20 1209   Chlorhexidine Gluconate Cloth 2 % PADS 6 each  6 each Topical Daily Eulogio Bear U, DO   6 each at 09/21/20 0936   diphenhydrAMINE (BENADRYL) capsule 25 mg  25 mg Oral QHS Wynetta Fines T, MD   25 mg at 09/18/20 2223   docusate sodium (COLACE) capsule 100 mg  100 mg Oral BID Eulogio Bear U, DO   100 mg at 09/21/20 0929   enoxaparin (LOVENOX) injection 40 mg  40 mg Subcutaneous Q24H Wynetta Fines T, MD   40 mg at 09/20/20 2159   gabapentin (NEURONTIN) capsule 900 mg  900 mg Oral TID Wynetta Fines T, MD   900 mg at 09/21/20 1626   guaiFENesin (ROBITUSSIN) 100 MG/5ML solution 100 mg  5 mL Oral Q4H PRN [place],  Ralene Cork, MD   100 mg at 09/19/20 2019   HYDROcodone-acetaminophen (HYCET) 7.5-325 mg/15 ml solution 10 mL  10 mL Oral QID PRN Lequita Halt, MD       insulin aspart (novoLOG) injection 0-15 Units  0-15 Units Subcutaneous TID WC Lequita Halt, MD   8 Units at 09/21/20 1627   insulin aspart (novoLOG) injection 3 Units  3 Units Subcutaneous TID WC Eulogio Bear U, DO   3 Units at 09/21/20 1627   Ipratropium-Albuterol (COMBIVENT) respimat 1 puff  1 puff Inhalation TID Eulogio Bear U, DO   1 puff at 09/21/20 1210   lactose free nutrition (BOOST PLUS) liquid 237 mL  237 mL Oral BID BM Vann, Jessica U, DO   237 mL at 09/21/20 1210   magic mouthwash  15  mL Oral TID Eulogio Bear U, DO   15 mL at 09/21/20 1627   megestrol (MEGACE) 400 MG/10ML suspension 800 mg  800 mg Oral Daily Eulogio Bear U, DO   800 mg at 09/21/20 0931   mirtazapine (REMERON) tablet 30 mg  30 mg Oral QHS Wynetta Fines T, MD   15 mg at 09/20/20 2157   ondansetron (ZOFRAN) tablet 4 mg  4 mg Oral Q6H PRN Lequita Halt, MD       Or   ondansetron [facility]) injection 4 mg  4 mg Intravenous Q6H PRN Wynetta Fines T, MD       ondansetron (ZOFRAN-ODT) disintegrating tablet 8 mg  8 mg Oral Q8H PRN Wynetta Fines T, MD       polyethylene glycol (MIRALAX / GLYCOLAX) packet 17 g  17 g Oral Daily Vann, Jessica U, DO   17 g at 09/21/20 0930   senna (SENOKOT) tablet 8.6 mg  1 tablet Oral Daily PRN Geradine Girt, DO       Facility-Administered Medications Ordered in Other Encounters  Medication Dose Route Frequency Provider Last Rate Last Admin   sodium chloride flush (NS) 0.9 % injection 10 mL  10 mL Intracatheter PRN Benay Pike, MD   10 mL at 07/22/20 1638      Allergies  Allergen Reactions   Penicillins Other (See Comments)    Unknown reaction  :  No family history on file.:   Social History   Socioeconomic History   Marital status: Married    Spouse name: Not on file   Number of children: 4   Years of education: Not on file   Highest education level: Not on file  Occupational History   Not on file  Tobacco Use   Smoking status: Never   Smokeless tobacco: Never  Vaping Use   Vaping Use: Never used  Substance and Sexual Activity   Alcohol use: Yes    Comment: occasional   Drug use: Never   Sexual activity: Not on file  Other Topics Concern   Not on file  Social History Narrative   Not on file   Social Determinants of Health   Financial Resource Strain: Not on file  Food Insecurity: Not on file  Transportation Needs: Not on file  Physical Activity: Not on file  Stress: Not on file  Social Connections: Not on file  Intimate Partner Violence: Not on file     Exam: Patient Vitals for the past 24 hrs:  BP Temp Temp src Pulse Resp SpO2  09/21/20 1617 116/66 98.7 F (37.1 C) Oral 88 20 100 %  09/21/20 0936 110/77 98.3 F (36.8 C) Oral 99  20 98 %  09/21/20 0631 99/71 97.7 F (36.5 C) Oral 89 -- 98 %  09/21/20 0026 101/67 97.9 F (36.6 C) Oral 86 20 100 %  09/20/20 1942 101/75 98.4 F (36.9 C) Axillary 100 20 100 %    Physical Exam Constitutional:      Comments: Frail   HENT:     Head: Normocephalic.  Cardiovascular:     Rate and Rhythm: Normal rate and regular rhythm.     Pulses: Normal pulses.     Heart sounds: Normal heart sounds.  Pulmonary:     Effort: Pulmonary effort is normal.     Breath sounds: Normal breath sounds. No wheezing or rales.  Musculoskeletal:        General: No swelling or tenderness. Normal range of motion.     Cervical back: Normal range of motion and neck supple. No rigidity.  Skin:    General: Skin is warm and dry.  Neurological:     General: No focal deficit present.     Mental Status: He is alert.  Psychiatric:        Mood and Affect: Mood normal.      Lab Results  Component Value Date   WBC 5.4 09/21/2020   HGB 7.5 (L) 09/21/2020   HCT 22.0 (L) 09/21/2020   PLT 168 09/21/2020   GLUCOSE 213 (H) 09/21/2020   ALT 41 09/16/2020   AST 27 09/16/2020   NA 132 (L) 09/21/2020   K 4.0 09/21/2020   CL 101 09/21/2020   CREATININE 0.47 (L) 09/21/2020   BUN 6 09/21/2020   CO2 24 09/21/2020    DG Chest 2 View  Result Date: 09/21/2020 CLINICAL DATA:  50 year old male with shortness of breath, weakness. History of head neck cancer. EXAM: CHEST - 2 VIEW COMPARISON:  09/17/2020 FINDINGS: Right chest wall Port-A-Cath in place with catheter tip near the cavoatrial junction, unchanged. The mediastinal contours are within normal limits. No cardiomegaly. Similar appearing right lower lobe perihilar opacity. Interval development of streaky peripheral opacities in the right lung base. The left lung is clear.  No pleural effusion or pneumothorax. Similar appearing sclerotic opacity in the proximal right humerus. IMPRESSION: 1. Similar appearing right lower lobe perihilar masslike opacification with development of right lower lobe peripheral subsegmental atelectasis. Query sequela of aspiration, persistent pneumonia, or pulmonary neoplasm. 2. 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 Electronically Signed   By: Ruthann Cancer MD   On: 09/21/2020 08:40   DG Chest 2 View  Result Date: 09/17/2020 CLINICAL DATA:  RIGHT chest pain with cough. History of head and neck cancer. EXAM: CHEST - 2 VIEW COMPARISON:  09/15/2020 PET-CT, 08/28/2020 chest radiograph and prior studies FINDINGS: RIGHT MEDIAL basilar opacity/consolidation appears increased from recent studies. Cardiomediastinal silhouette is unchanged. No pleural effusion or pneumothorax identified. A RIGHT Port-A-Cath with tip overlying SUPERIOR cavoatrial junction is again noted. Sclerotic lesion involving anterior LEFT 4th rib is again noted. IMPRESSION: RIGHT MEDIAL basilar opacity/consolidation, increased from recent studies and may represent infection, aspiration and/or atelectasis. No other significant change. Electronically Signed   By: Margarette Canada M.D.   On: 09/17/2020 12:27   CT Angio Chest PE W and/or Wo Contrast  Result Date: 09/17/2020 CLINICAL DATA:  50 year old male with cough, shortness of breath and RIGHT chest pain. EXAM: CT ANGIOGRAPHY CHEST WITH CONTRAST TECHNIQUE: Multidetector CT imaging of the chest was performed using the standard protocol during bolus administration of intravenous contrast. Multiplanar CT image reconstructions and MIPs were obtained to  evaluate the vascular anatomy. CONTRAST:  38mL OMNIPAQUE IOHEXOL 350 MG/ML SOLN COMPARISON:  09/15/2020 PET CT FINDINGS: Cardiovascular: Satisfactory opacification of the pulmonary arteries to the segmental level. No evidence of pulmonary embolism. Normal heart size. No  pericardial effusion. Mediastinum/Nodes: No significant change. Enlarged RIGHT hilar lymph nodes are identified. Lungs/Pleura: Masslike consolidation within the RIGHT LOWER lobe now measures 10.3 x 5.1 cm, previously 2.7 x 2.5 cm on 09/15/2020 study. A 12 mm RIGHT middle lobe nodule is unchanged. A small RIGHT pleural effusion is now identified. No pneumothorax or LEFT pleural effusion. Upper Abdomen: No acute abnormality. Musculoskeletal: Sclerotic lesions within bilateral ribs, proximal RIGHT humerus, sternum and thoracic spine again noted Review of the MIP images confirms the above findings. IMPRESSION: 1. No evidence of pulmonary emboli. 2. Rapid increase in large RIGHT LOWER lobe masslike consolidation since 96/28/3662 almost certainly related to infection/pneumonia given significant change over 2 days. Component of underlying malignancy is not excluded however. New small RIGHT pleural effusion. 3. Unchanged 12 mm RIGHT middle lobe nodule and sclerotic lesions within bilateral ribs, proximal RIGHT humerus, sternum and thoracic spine compatible with metastatic disease. Electronically Signed   By: Margarette Canada M.D.   On: 09/17/2020 17:02   NM PET Image Restag (PS) Skull Base To Thigh  Result Date: 09/16/2020 CLINICAL DATA:  Subsequent treatment strategy for head neck cancer. EXAM: NUCLEAR MEDICINE PET SKULL BASE TO THIGH TECHNIQUE: 6.89 mCi F-18 FDG was injected intravenously. Full-ring PET imaging was performed from the skull base to thigh after the radiotracer. CT data was obtained and used for attenuation correction and anatomic localization. Fasting blood glucose: 136 mg/dl COMPARISON:  PET-CT 05/04/2020 FINDINGS: Mediastinal blood pool activity: SUV max 2.25 Liver activity: SUV max NA NECK: No hypermetabolic lymph nodes in the neck. Incidental CT findings: none CHEST: Inferior to the RIGHT hilum in the medial aspect of the RIGHT lower lobe there is new hypermetabolic consolidation measuring 2.7 cm with SUV  max equal 6.2 on image 90. There is newly enlarged hypermetabolic nodule in the RIGHT middle lobe measuring 12 mm (image 41/CT series 8) with SUV max equal 5.85 ( fused data set image 93). There is interval resolution in the previously seen patchy ground-glass nodularity in the RIGHT upper lobe. Decreased metabolic activity of RIGHT hilar lymph node. Incidental CT findings: none ABDOMEN/PELVIS: The large hypermetabolic lesion in the inferior RIGHT hepatic lobe is improved significantly with minimal residual metabolic activity (SUV max equal 3.6 decreased from SUV max equal 11.5). The lesion is measurably smaller measuring 20 mm (image 129 CT series 4) compared with 35 mm. No new hepatic lesions. Incidental CT findings: none SKELETON: Marked improvement skeletal metastasis. Hypermetabolic lesions in the sacrum and bilateral iliac bones have completely resolved. Metabolic activity at the L3 vertebral body has near completely resolved and now normal background level. Sclerotic bone lesion remaining. Likewise intense radiotracer activity was present previously in the RIGHT humeral head with SUV max equal 10.1 now reduced to SUV max equal 4.4. Sclerosis of the bone has increased. Incidental CT findings: none IMPRESSION: 1. Mixed response to therapy. 2. Marked improvement in skeletal metastasis with near resolution of metabolic activity of previous multifocal skeletal metastasis. Sclerotic lesions remain in the underlying bone. 3. Marked improvement in solitary hypermetabolic hepatic metastasis reduced in metabolic activity and size. 4. Two new lesions in the RIGHT lung which are hypermetabolic. One RIGHT upper lobe nodule and a new focus of hypermetabolic nodular consolidation in the RIGHT infrahilar lower lobe. 5. Improvement in  ground-glass nodularity in the RIGHT upper lobe suggest resolved pulmonary infection. Electronically Signed   By: Suzy Bouchard M.D.   On: 09/16/2020 09:03   DG Chest Portable 1  View  Result Date: 08/28/2020 CLINICAL DATA:  Chemotherapy for cancer.  Cough. EXAM: PORTABLE CHEST 1 VIEW COMPARISON:  None. FINDINGS: A right Port-A-Cath terminates in the central SVC. No pneumothorax. The heart, hila, mediastinum are normal. No pulmonary nodules or masses. No focal infiltrates. IMPRESSION: No active disease. Electronically Signed   By: Dorise Bullion III M.D   On: 08/28/2020 20:05     DG Chest 2 View  Result Date: 09/21/2020 CLINICAL DATA:  50 year old male with shortness of breath, weakness. History of head neck cancer. EXAM: CHEST - 2 VIEW COMPARISON:  09/17/2020 FINDINGS: Right chest wall Port-A-Cath in place with catheter tip near the cavoatrial junction, unchanged. The mediastinal contours are within normal limits. No cardiomegaly. Similar appearing right lower lobe perihilar opacity. Interval development of streaky peripheral opacities in the right lung base. The left lung is clear. No pleural effusion or pneumothorax. Similar appearing sclerotic opacity in the proximal right humerus. IMPRESSION: 1. Similar appearing right lower lobe perihilar masslike opacification with development of right lower lobe peripheral subsegmental atelectasis. Query sequela of aspiration, persistent pneumonia, or pulmonary neoplasm. 2. 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 Electronically Signed   By: Ruthann Cancer MD   On: 09/21/2020 08:40   DG Chest 2 View  Result Date: 09/17/2020 CLINICAL DATA:  RIGHT chest pain with cough. History of head and neck cancer. EXAM: CHEST - 2 VIEW COMPARISON:  09/15/2020 PET-CT, 08/28/2020 chest radiograph and prior studies FINDINGS: RIGHT MEDIAL basilar opacity/consolidation appears increased from recent studies. Cardiomediastinal silhouette is unchanged. No pleural effusion or pneumothorax identified. A RIGHT Port-A-Cath with tip overlying SUPERIOR cavoatrial junction is again noted. Sclerotic lesion involving anterior LEFT 4th rib is again  noted. IMPRESSION: RIGHT MEDIAL basilar opacity/consolidation, increased from recent studies and may represent infection, aspiration and/or atelectasis. No other significant change. Electronically Signed   By: Margarette Canada M.D.   On: 09/17/2020 12:27   CT Angio Chest PE W and/or Wo Contrast  Result Date: 09/17/2020 CLINICAL DATA:  50 year old male with cough, shortness of breath and RIGHT chest pain. EXAM: CT ANGIOGRAPHY CHEST WITH CONTRAST TECHNIQUE: Multidetector CT imaging of the chest was performed using the standard protocol during bolus administration of intravenous contrast. Multiplanar CT image reconstructions and MIPs were obtained to evaluate the vascular anatomy. CONTRAST:  1mL OMNIPAQUE IOHEXOL 350 MG/ML SOLN COMPARISON:  09/15/2020 PET CT FINDINGS: Cardiovascular: Satisfactory opacification of the pulmonary arteries to the segmental level. No evidence of pulmonary embolism. Normal heart size. No pericardial effusion. Mediastinum/Nodes: No significant change. Enlarged RIGHT hilar lymph nodes are identified. Lungs/Pleura: Masslike consolidation within the RIGHT LOWER lobe now measures 10.3 x 5.1 cm, previously 2.7 x 2.5 cm on 09/15/2020 study. A 12 mm RIGHT middle lobe nodule is unchanged. A small RIGHT pleural effusion is now identified. No pneumothorax or LEFT pleural effusion. Upper Abdomen: No acute abnormality. Musculoskeletal: Sclerotic lesions within bilateral ribs, proximal RIGHT humerus, sternum and thoracic spine again noted Review of the MIP images confirms the above findings. IMPRESSION: 1. No evidence of pulmonary emboli. 2. Rapid increase in large RIGHT LOWER lobe masslike consolidation since 24/23/5361 almost certainly related to infection/pneumonia given significant change over 2 days. Component of underlying malignancy is not excluded however. New small RIGHT pleural effusion. 3. Unchanged 12 mm RIGHT middle lobe nodule and sclerotic lesions  within bilateral ribs, proximal RIGHT  humerus, sternum and thoracic spine compatible with metastatic disease. Electronically Signed   By: Margarette Canada M.D.   On: 09/17/2020 17:02   NM PET Image Restag (PS) Skull Base To Thigh  Result Date: 09/16/2020 CLINICAL DATA:  Subsequent treatment strategy for head neck cancer. EXAM: NUCLEAR MEDICINE PET SKULL BASE TO THIGH TECHNIQUE: 6.89 mCi F-18 FDG was injected intravenously. Full-ring PET imaging was performed from the skull base to thigh after the radiotracer. CT data was obtained and used for attenuation correction and anatomic localization. Fasting blood glucose: 136 mg/dl COMPARISON:  PET-CT 05/04/2020 FINDINGS: Mediastinal blood pool activity: SUV max 2.25 Liver activity: SUV max NA NECK: No hypermetabolic lymph nodes in the neck. Incidental CT findings: none CHEST: Inferior to the RIGHT hilum in the medial aspect of the RIGHT lower lobe there is new hypermetabolic consolidation measuring 2.7 cm with SUV max equal 6.2 on image 90. There is newly enlarged hypermetabolic nodule in the RIGHT middle lobe measuring 12 mm (image 41/CT series 8) with SUV max equal 5.85 ( fused data set image 93). There is interval resolution in the previously seen patchy ground-glass nodularity in the RIGHT upper lobe. Decreased metabolic activity of RIGHT hilar lymph node. Incidental CT findings: none ABDOMEN/PELVIS: The large hypermetabolic lesion in the inferior RIGHT hepatic lobe is improved significantly with minimal residual metabolic activity (SUV max equal 3.6 decreased from SUV max equal 11.5). The lesion is measurably smaller measuring 20 mm (image 129 CT series 4) compared with 35 mm. No new hepatic lesions. Incidental CT findings: none SKELETON: Marked improvement skeletal metastasis. Hypermetabolic lesions in the sacrum and bilateral iliac bones have completely resolved. Metabolic activity at the L3 vertebral body has near completely resolved and now normal background level. Sclerotic bone lesion remaining.  Likewise intense radiotracer activity was present previously in the RIGHT humeral head with SUV max equal 10.1 now reduced to SUV max equal 4.4. Sclerosis of the bone has increased. Incidental CT findings: none IMPRESSION: 1. Mixed response to therapy. 2. Marked improvement in skeletal metastasis with near resolution of metabolic activity of previous multifocal skeletal metastasis. Sclerotic lesions remain in the underlying bone. 3. Marked improvement in solitary hypermetabolic hepatic metastasis reduced in metabolic activity and size. 4. Two new lesions in the RIGHT lung which are hypermetabolic. One RIGHT upper lobe nodule and a new focus of hypermetabolic nodular consolidation in the RIGHT infrahilar lower lobe. 5. Improvement in ground-glass nodularity in the RIGHT upper lobe suggest resolved pulmonary infection. Electronically Signed   By: Suzy Bouchard M.D.   On: 09/16/2020 09:03   DG Chest Portable 1 View  Result Date: 08/28/2020 CLINICAL DATA:  Chemotherapy for cancer.  Cough. EXAM: PORTABLE CHEST 1 VIEW COMPARISON:  None. FINDINGS: A right Port-A-Cath terminates in the central SVC. No pneumothorax. The heart, hila, mediastinum are normal. No pulmonary nodules or masses. No focal infiltrates. IMPRESSION: No active disease. Electronically Signed   By: Dorise Bullion III M.D   On: 08/28/2020 20:05    Assessment and Plan:   This is a very pleasant 50 yr old male patient with metastatic SCC oropharynx with bone, liver lesions currently on chemo immunotherapy, last cycle given on 09/12/20 admitted with worsening SOB, right chest pain and right shoulder pain He most recently had PET imaging which showed excellent respone in all area except for two new lesions in the right chest which were thought to be hypermetabolic, report suggested mixed response. When I called him to  discuss PET Results, he didn't have any other concerns suggesting a pneumonia. So we though these could be two areas of refractory  malignancy and consider radiation to these area while continuing systemic chemotherapy But given the more recent CT, his symptoms, I do agree this is likely an active pneumonia. Of course, hard question is if there is underlying tumor. The fact that he has responded so well in all other areas and he is clinically improving with abx now suggests likely an active pneumonia which cannot be differentiated on PET from malignancy. I agree with abx, follow up with out outpatient and consider repeat imaging with CT chest in about 3/4 weeks. He is anxious about the wait, but I have assured him that we will monitor. We can also consider bronchoscopy to examine this location to rule out underlying malignancy. He is agreeable to what ever we recommend If he shows of no radiological improvement please consider a bronch inpatient, clinically he looks very stable to me. If he doesn't get a bronch, I will order a short interval CT outpatient.  I spent 50 minutes in the care of this patient, including H and P, review of records, counseling and coordination of care.  Thank you for this referral.

## 2020-09-21 NOTE — Progress Notes (Signed)
Mobility Specialist: Progress Note   09/21/20 1704  Mobility  Activity Ambulated in hall  Level of Assistance Independent  Assistive Device None  Distance Ambulated (ft) 470 ft  Mobility Ambulated independently in hallway  Mobility Response Tolerated well  Mobility performed by Mobility specialist  $Mobility charge 1 Mobility   Pre-Mobility: 104 HR, 96% SpO2 During Mobility: 106 HR, 99-100% SpO2 Post-Mobility: 100 HR, 98% SpO2  Pt asx throughout ambulation. Pt is sitting EOB after walk with family member present in the room and call bell at his side.   [facility] Linzy Darling Mobility Specialist Mobility Specialist Phone: [phone]

## 2020-09-21 NOTE — Progress Notes (Signed)
PROGRESS NOTE    Albert Flowers  IEP:[id number] DOB: 1970/07/08 DOA: 09/17/2020 PCP: [name], MD    No chief complaint on file.   Brief Narrative:  Albert Flowers is an 50 y.o. 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 Found to have large right lower lobe mass-like consolidation.      Subjective:   Continue to have nonproductive cough, reports cough more after a meal Reports h/o trouble swallowing Denies chest pain, no edema, no fever, no hypoxia at rest Wife and daughter at bedside   Assessment & Plan:   Active Problems:   Community acquired pneumonia of right middle lobe of lung   Lobar pneumonia -Recent history of COVID-19 infection -CT scan on presentation showed right lower lobe masslike consolidation, not able to rule out underlying malignancy -He appear clinically improved on antibiotics, he report history of swallowing difficulties, will get swallow eval -Continue current antibiotics -We will need to repeat chest CT in a few weeks after antibiotic treatment  Impaired fasting blood glucose A1c 7.7 No prior diagnosis of diabetes Noticed recent steroid use Currently on meal coverage and sliding scale insulin  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 -Hemoglobin 7.5, diastolic appear to have overt external bleeding -Monitor  metastatic HPV positive squamous cell carcinoma  on treatment with caroplatin and Keytruda Case discussed with oncology today Oncology will see patient, will follow recommendation  Body mass index is 19.45 kg/m.Marland Kitchen      Unresulted Labs (From admission, onward)     Start     Ordered   09/17/20 1915  Expectorated Sputum Assessment  w Gram Stain, Rflx to Resp Cult  Once,   R        09/17/20 1914   09/17/20 1915  Strep pneumoniae urinary antigen  Once,   STAT        09/17/20 1914              DVT prophylaxis: enoxaparin (LOVENOX) injection 40 mg Start: 09/17/20 2043   Code Status: Full Family Communication: Wife and daughter at bedside Disposition:   Status is: Inpatient  Dispo: The patient is from: Home              Anticipated d/c is to: Home              Anticipated d/c date is: To be determined                Consultants:  Oncology  Procedures:  None  Antimicrobials:   Anti-infectives (From admission, onward)    Start     Dose/Rate Route Frequency Ordered Stop   09/17/20 1542  azithromycin (ZITHROMAX) 500 mg in sodium chloride 0.9 % 250 mL IVPB        500 mg 250 mL/hr over 60 Minutes Intravenous Every 24 hours 09/17/20 1441     09/17/20 1512  ceFEPIme (MAXIPIME) 2 g in sodium chloride 0.9 % 100 mL IVPB        2 g 200 mL/hr over 30 Minutes Intravenous Every 8 hours 09/17/20 1441     09/17/20 1415  levofloxacin (LEVAQUIN) IVPB 750 mg  Status:  Discontinued        750 mg 100 mL/hr over 90 Minutes Intravenous Every  24 hours 09/17/20 1412 09/17/20 1441   09/17/20 1400  levofloxacin (LEVAQUIN) IVPB 750 mg  Status:  Discontinued        750 mg 100 mL/hr over 90 Minutes Intravenous  Once 09/17/20 1346 09/17/20 1412           Objective: Vitals:   09/20/20 1942 09/21/20 0026 09/21/20 0631 09/21/20 0936  BP: 101/75 101/67 99/71 110/77  Pulse: 100 86 89 99  Resp: 20 20  20   Temp: 98.4 F (36.9 C) 97.9 F (36.6 C) 97.7 F (36.5 C) 98.3 F (36.8 C)  TempSrc: Axillary Oral Oral Oral  SpO2: 100% 100% 98% 98%  Weight:      Height:        Intake/Output Summary (Last 24 hours) at 09/21/2020 1256 Last data filed at 09/21/2020 6734 Gross per 24 hour  Intake 550 ml  Output 900 ml  Net -350 ml   Filed Weights   09/17/20 1316 09/19/20 0346  Weight: 65.8 kg 61.5 kg     Examination:  General exam: chronically ill appearing calm, NAD Respiratory system: diminished right lower lobe, Respiratory effort normal. Cardiovascular system: S1 & S2 heard, slightly sinus tachycardia,  No pedal edema. Gastrointestinal system: Abdomen is nondistended, soft and nontender.  Normal bowel sounds heard. Central nervous system: Alert and oriented. No focal neurological deficits. Extremities: generalized weakness, no edema Skin: No rashes, lesions or ulcers Psychiatry: Judgement and insight appear normal. Mood & affect appropriate.     Data Reviewed: I have personally reviewed following labs and imaging studies  CBC: Recent Labs  Lab 09/16/20 1118 09/17/20 1204 09/18/20 0040 09/19/20 0042 09/21/20 0248  WBC 7.7 10.5 8.0 8.9 5.4  NEUTROABS 6.5  --   --   --   --   HGB 8.6* 9.2* 7.4* 8.2* 7.5*  HCT 25.3* 27.2* 22.5* 24.5* 22.0*  MCV 95.1 98.2 97.4 97.6 96.1  PLT 170 204 168 177 193    Basic Metabolic Panel: Recent Labs  Lab 09/16/20 1118 09/17/20 1204 09/18/20 0040 09/19/20 0042 09/21/20 0248  NA 134* 130* 129* 130* 132*  K 3.8 4.2 3.8 4.0 4.0  CL 97* 95* 99 98 101  CO2 27 26 24 25 24   GLUCOSE 320* 208* 219* 179* 213*  BUN 16 12 8 6 6   CREATININE 0.75 0.69 0.54* 0.55* 0.47*  CALCIUM 9.2 8.8* 8.3* 8.7* 8.9    GFR: Estimated Creatinine Clearance: 95 mL/min (A) (by C-G formula based on SCr of 0.47 mg/dL (L)).  Liver Function Tests: Recent Labs  Lab 09/16/20 1118  AST 27  ALT 41  ALKPHOS 140*  BILITOT 0.2*  PROT 7.1  ALBUMIN 2.8*    CBG: Recent Labs  Lab 09/20/20 1137 09/20/20 1614 09/20/20 2129 09/21/20 0632 09/21/20 1100  GLUCAP 250* 202* 242* 157* 227*     Recent Results (from the past 240 hour(s))  Blood Culture (routine x 2)     Status: None (Preliminary result)   Collection Time: 09/17/20  2:40 PM   Specimen: BLOOD  Result Value Ref Range Status   Specimen Description BLOOD PORTA CATH  Final   Special Requests    Final    BOTTLES DRAWN AEROBIC AND ANAEROBIC Blood Culture results may not be optimal due to an inadequate volume of blood received in culture bottles   Culture   Final    NO GROWTH 4 DAYS Performed at [facility], [place] [name], [place], [place] [address]    Report Status PENDING  Incomplete  MRSA Next Gen by PCR, Nasal     Status: None   Collection Time: 09/17/20  2:41 PM   Specimen: Nasopharyngeal Swab; Nasal Swab  Result Value Ref Range Status   MRSA by PCR Next Gen NOT DETECTED NOT DETECTED Final    Comment: (NOTE) The GeneXpert MRSA Assay (FDA approved for NASAL specimens only), is one component of a comprehensive MRSA colonization surveillance program. It is not intended to diagnose MRSA infection nor to guide or monitor treatment for MRSA infections. Test performance is not FDA approved in patients less than 18 years old. Performed at [facility], [place] [address] [place], [place] [address]   Blood Culture (routine x 2)     Status: None (Preliminary result)   Collection Time: 09/17/20  3:00 PM   Specimen: BLOOD  Result Value Ref Range Status   Specimen Description BLOOD RIGHT ANTECUBITAL  Final   Special Requests   Final    BOTTLES DRAWN AEROBIC AND ANAEROBIC Blood Culture results may not be optimal due to an inadequate volume of blood received in culture bottles   Culture   Final    NO GROWTH 4 DAYS Performed at [facility], [place] [address], [place], [place] [address]    Report Status PENDING  Incomplete  Resp Panel by RT-PCR (Flu A&B, Covid) Nasopharyngeal Swab     Status: Abnormal   Collection Time: 09/17/20  3:15 PM   Specimen: Nasopharyngeal Swab; Nasopharyngeal(NP) swabs in vial transport medium  Result Value Ref Range Status   SARS Coronavirus 2 by RT PCR POSITIVE (A) NEGATIVE Final    Comment: RESULT CALLED TO, READ BACK BY AND VERIFIED WITH: RN J.COOK ON [id number] AT 2482 BY E.PARRISH (NOTE) SARS-CoV-2 target nucleic acids are  DETECTED.  The SARS-CoV-2 RNA is generally detectable in upper respiratory specimens during the acute phase of infection. Positive results are indicative of the presence of the identified virus, but do not rule out bacterial infection or co-infection with other pathogens not detected by the test. Clinical correlation with patient history and other diagnostic information is necessary to determine patient infection status. The expected result is Negative.  Fact Sheet for Patients: EntrepreneurPulse.com.au  Fact Sheet for Healthcare Providers: IncredibleEmployment.be  This test is not yet approved or cleared by the [place] FDA and  has been authorized for detection and/or diagnosis of SARS-CoV-2 by FDA under an Emergency Use Authorization (EUA).  This EUA will remain in effect (meaning this te st can be used) for the duration of  the COVID-19 declaration under Section 564(b)(1) of the Act, 21 U.S.C. section 360bbb-3(b)(1), unless the authorization is terminated or revoked sooner.     Influenza A by PCR NEGATIVE NEGATIVE Final   Influenza B by PCR NEGATIVE NEGATIVE Final    Comment: (NOTE) The Xpert Xpress SARS-CoV-2/FLU/RSV plus assay is intended as an aid in the diagnosis of influenza from Nasopharyngeal swab specimens and should not be used as a sole basis for treatment. Nasal washings and aspirates are unacceptable for Xpert Xpress SARS-CoV-2/FLU/RSV testing.  Fact Sheet for Patients: EntrepreneurPulse.com.au  Fact Sheet for Healthcare Providers: IncredibleEmployment.be  This test is not yet approved or cleared by the [place] FDA and has been authorized for detection and/or diagnosis of SARS-CoV-2 by FDA under an Emergency Use Authorization (EUA). This EUA will remain in effect (meaning this test can be used) for the duration of the COVID-19 declaration under Section 564(b)(1) of the Act, 21  U.S.C. section 360bbb-3(b)(1),  unless the authorization is terminated or revoked.  Performed at [facility], [place] [address], [place], [place] [address]   Urine culture     Status: None   Collection Time: 09/17/20  9:40 PM   Specimen: In/Out Cath Urine  Result Value Ref Range Status   Specimen Description IN/OUT CATH URINE  Final   Special Requests NONE  Final   Culture   Final    NO GROWTH Performed at [facility], [place] [address], [place], [place] [address]    Report Status 09/19/2020 FINAL  Final         Radiology Studies: DG Chest 2 View  Result Date: 09/21/2020 CLINICAL DATA:  50 year old male with shortness of breath, weakness. History of head neck cancer. EXAM: CHEST - 2 VIEW COMPARISON:  09/17/2020 FINDINGS: Right chest wall Port-A-Cath in place with catheter tip near the cavoatrial junction, unchanged. The mediastinal contours are within normal limits. No cardiomegaly. Similar appearing right lower lobe perihilar opacity. Interval development of streaky peripheral opacities in the right lung base. The left lung is clear. No pleural effusion or pneumothorax. Similar appearing sclerotic opacity in the proximal right humerus. IMPRESSION: 1. Similar appearing right lower lobe perihilar masslike opacification with development of right lower lobe peripheral subsegmental atelectasis. Query sequela of aspiration, persistent pneumonia, or pulmonary neoplasm. 2. 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 Electronically Signed   By: Ruthann Cancer MD   On: 09/21/2020 08:40        Scheduled Meds:  benzonatate  200 mg Oral TID   Chlorhexidine Gluconate Cloth  6 each Topical Daily   diphenhydrAMINE  25 mg Oral QHS   docusate sodium  100 mg Oral BID   enoxaparin (LOVENOX) injection  40 mg Subcutaneous Q24H   gabapentin  900 mg Oral TID   insulin aspart  0-15 Units Subcutaneous TID WC   insulin aspart  3 Units Subcutaneous TID WC   Ipratropium-Albuterol   1 puff Inhalation TID   lactose free nutrition  237 mL Oral BID BM   magic mouthwash  15 mL Oral TID   megestrol  800 mg Oral Daily   mirtazapine  30 mg Oral QHS   polyethylene glycol  17 g Oral Daily   Continuous Infusions:  azithromycin 500 mg (09/21/20 0935)   ceFEPime (MAXIPIME) IV 2 g (09/21/20 1209)     LOS: 4 days   Time spent: 22mins Greater than 50% of this time was spent in counseling, explanation of diagnosis, planning of further management, and coordination of care.   Voice Recognition Viviann Spare dictation system was used to create this note, attempts have been made to correct errors. Please contact the author with questions and/or clarifications.   Florencia Reasons, MD PhD FACP [facility]  Available via Epic secure chat 7am-7pm for nonurgent issues Please page for urgent issues To page the attending provider between 7A-7P or the covering provider during after hours 7P-7A, please log into the web site [email or web address] and access using universal [place] password for that web site. If you do not have the password, please call the hospital operator.    09/21/2020, 12:56 PM

## 2020-09-21 NOTE — Progress Notes (Signed)
Inpatient Diabetes Program Recommendations  AACE/ADA: New Consensus Statement on Inpatient Glycemic Control (2015)  Target Ranges:  Prepandial:   less than 140 mg/dL      Peak postprandial:   less than 180 mg/dL (1-2 hours)      Critically ill patients:  140 - 180 mg/dL   Lab Results  Component Value Date   GLUCAP 227 (H) 09/21/2020   HGBA1C 7.7 (H) 09/18/2020    Review of Glycemic Control Results for Albert Flowers, Albert Flowers (MRN [id number]) as of 09/21/2020 11:58  Ref. Range 09/20/2020 16:14 09/20/2020 21:29 09/21/2020 06:32 09/21/2020 11:00  Glucose-Capillary Latest Ref Range: 70 - 99 mg/dL 202 (H) 242 (H) 157 (H) 227 (H)   Diabetes history: steroid induced? Outpatient Diabetes medications: none Current orders for Inpatient glycemic control: Novolog 3 units TID, Novolog 0-15 units TID  Inpatient Diabetes Program Recommendations:    Consider  -Levemir 8 units QD -Boost nutritional supplement contains 41 g of CHO per serving. Assess alternatives?  Thanks, Bronson Curb, MSN, RNC-OB Diabetes Coordinator [phone] (8a-5p)

## 2020-09-22 DIAGNOSIS — R918 Other nonspecific abnormal finding of lung field: Secondary | ICD-10-CM

## 2020-09-22 LAB — CULTURE, BLOOD (ROUTINE X 2)
Culture: NO GROWTH
Culture: NO GROWTH

## 2020-09-22 LAB — GLUCOSE, CAPILLARY
Glucose-Capillary: 158 mg/dL — ABNORMAL HIGH (ref 70–99)
Glucose-Capillary: 239 mg/dL — ABNORMAL HIGH (ref 70–99)
Glucose-Capillary: 250 mg/dL — ABNORMAL HIGH (ref 70–99)
Glucose-Capillary: 299 mg/dL — ABNORMAL HIGH (ref 70–99)

## 2020-09-22 MED ORDER — GLUCERNA SHAKE PO LIQD: 237.0000 mL | Freq: Three times a day (TID) | ORAL | Status: DC

## 2020-09-22 NOTE — Consult Note (Signed)
NAME:  Albert Flowers, MRN:  [id number], DOB:  [birth year], LOS: 5 ADMISSION DATE:  09/17/2020, CONSULTATION DATE:  09/22/2020 REFERRING MD:  Dr. Erlinda Hong, [facility], CHIEF COMPLAINT:  Abnormal CT chest   History of Present Illness:  50 yo male with HPV mediated malignancy at the base of his tongue diagnosed in March 2020.  Found to have metastatic lesions to bone (appendicular skeleton, trunk, skull, and facial bones), and to liver.  He had palliative radiation to spine in March 2022.  Treated with carboplatin, 5FU, and keytruda.  He had COVID 19 infection 08/28/20, and treated with remdesivir.   He recovered from this.  He had PET scan as outpt on 09/15/20.  This showed significant response to most of his metastatic lesions.  He did have new hypermetabolic nodule 12 mm in RML with SUV 5.85 and Rt perihilar area of hypermetabolic activity.  He started getting pleuritic pain on his right chest with radiation to his right shoulder blade on 09/16/20.  He was brought to the emergency room.  He had fever 101.51F.  CT chest from 09/17/20 showed marked increase in Rt lower lobe perihilar mass-like consolidation (10.3 x 5.1 cm from 2.7 x 2.5 cm), and no change to 12 mm RML nodule.  He was started on broad spectrum antibiotics.  Since hospital admission his fatigue has improved and he is no longer having right pleuritic chest pain.  He has cough, but this has been chronic since he was diagnosed with cancer.  He does not bringing up small amounts of yellow sputum more recently.      Pertinent  Medical History  DM type 2  Significant Hospital Events: Including procedures, antibiotic start and stop dates in addition to other pertinent events   7/02 Admit, start cefepime and zithromax 7/06 Oncology consulted  Interim History / Subjective:    Objective   Blood pressure 108/86, pulse 89, temperature 98.2 F (36.8 C), temperature source Oral, resp. rate 20, height 5\' 10"  (1.778 m), weight 61.5 kg, SpO2 100 %.         Intake/Output Summary (Last 24 hours) at 09/22/2020 1138 Last data filed at 09/22/2020 2703 Gross per 24 hour  Intake 730 ml  Output 700 ml  Net 30 ml   Filed Weights   09/17/20 1316 09/19/20 0346  Weight: 65.8 kg 61.5 kg    Examination:  General - alert Eyes - pupils reactive ENT - no sinus tenderness, no stridor Cardiac - regular rate/rhythm, no murmur Chest - bronchial breath sounds Rt base, no wheeze Abdomen - soft, non tender, + bowel sounds Extremities - no cyanosis, clubbing, or edema Skin - no rashes Neuro - normal strength, moves extremities, follows commands Psych - normal mood and behavior Lymphatics - no lymphadenopathy   Discussion  He symptom constellation at this time is most consistent with bacterial pneumonia in setting of metastatic tongue cancer and recent COVID 19 infection.  It is difficult to determine whether new findings in Rt lung on PET scan from 09/15/20 represented early stages of pneumonia versus metastatic disease, or both.  He is clinically improving with antibiotic therapy.  We discussed proceeding with bronchoscopy now versus short term radiographic follow up.  Explained there is likely no downside to waiting on bronchoscopy, but if radiographic appearance improves after treatment for pneumonia then he might not need procedure.  If he needs bronchoscopy, then likely would need to arrange for electro-navigational bronchoscopy to adequately sample 12 mm nodule in right middle lobe.  Assessment &  Plan:   Right lower lung infiltrate likely from bacterial pneumonia. - complete course of antibiotics per primary team - he has been scheduled for pulmonary office follow up with Dr. Randall Hiss on 09/27/20 at 9:30 am  Metastatic HPV associated tongue base cancer. - followed by Dr. Chryl Heck with [facility]  Updated pt's wife at bedside about plan.   Labs   CBC: Recent Labs  Lab 09/16/20 1118 09/17/20 1204 09/18/20 0040 09/19/20 0042  09/21/20 0248  WBC 7.7 10.5 8.0 8.9 5.4  NEUTROABS 6.5  --   --   --   --   HGB 8.6* 9.2* 7.4* 8.2* 7.5*  HCT 25.3* 27.2* 22.5* 24.5* 22.0*  MCV 95.1 98.2 97.4 97.6 96.1  PLT 170 204 168 177 371    Basic Metabolic Panel: Recent Labs  Lab 09/16/20 1118 09/17/20 1204 09/18/20 0040 09/19/20 0042 09/21/20 0248  NA 134* 130* 129* 130* 132*  K 3.8 4.2 3.8 4.0 4.0  CL 97* 95* 99 98 101  CO2 27 26 24 25 24   GLUCOSE 320* 208* 219* 179* 213*  BUN 16 12 8 6 6   CREATININE 0.75 0.69 0.54* 0.55* 0.47*  CALCIUM 9.2 8.8* 8.3* 8.7* 8.9   GFR: Estimated Creatinine Clearance: 95 mL/min (A) (by C-G formula based on SCr of 0.47 mg/dL (L)). Recent Labs  Lab 09/17/20 1204 09/17/20 1345 09/18/20 0040 09/19/20 0042 09/21/20 0248  WBC 10.5  --  8.0 8.9 5.4  LATICACIDVEN  --  1.1 1.4  --   --     Liver Function Tests: Recent Labs  Lab 09/16/20 1118  AST 27  ALT 41  ALKPHOS 140*  BILITOT 0.2*  PROT 7.1  ALBUMIN 2.8*   No results for input(s): LIPASE, AMYLASE in the last 168 hours. No results for input(s): AMMONIA in the last 168 hours.  ABG No results found for: PHART, PCO2ART, PO2ART, HCO3, TCO2, ACIDBASEDEF, O2SAT   Coagulation Profile: Recent Labs  Lab 09/17/20 1346  INR 1.2    Cardiac Enzymes: No results for input(s): CKTOTAL, CKMB, CKMBINDEX, TROPONINI in the last 168 hours.  HbA1C: Hgb A1c MFr Bld  Date/Time Value Ref Range Status  09/18/2020 12:40 AM 7.7 (H) 4.8 - 5.6 % Final    Comment:    (NOTE) Pre diabetes:          5.7%-6.4%  Diabetes:              >6.4%  Glycemic control for   <7.0% adults with diabetes     CBG: Recent Labs  Lab 09/21/20 1100 09/21/20 1619 09/21/20 2127 09/22/20 0632 09/22/20 1135  GLUCAP 227* 259* 334* 158* 239*    Review of Systems:   Reviewed and negative  Past Medical History:  He,  has a past medical history of Diabetes mellitus without complication ([place]) and Malignant neoplasm of base of tongue ([place]) (06/02/2018).    Surgical History:   Past Surgical History:  Procedure Laterality Date   IR IMAGING GUIDED PORT INSERTION  07/15/2020   left neck dissection Left 05/04/2019   Left Neck Dissection by Dr. [name] at [facility].    neck sugery     as a child "had a knot removed from neck" not sure which side.    RIght neck dissection Right 06/16/2018   TORS and right neck dissection. Dr. [name] at [facility]     Social History:   reports that he has never smoked. He has never used smokeless tobacco. He reports current alcohol  use. He reports that he does not use drugs.   Family History:  His family history is significant for hypertension.  Allergies Allergies  Allergen Reactions   Penicillins Other (See Comments)    Unknown reaction     Home Medications  Prior to Admission medications   Medication Sig Start Date End Date Taking? Authorizing Provider  dexamethasone (DECADRON) 4 MG tablet Take 2 tablets (8 mg total) by mouth daily. Start the day after carboplatin chemotherapy for 3 days. 07/12/20  Yes Benay Pike, MD  gabapentin (NEURONTIN) 300 MG capsule Take 900 mg by mouth 3 (three) times daily.   Yes [provider]  lidocaine-prilocaine (EMLA) cream Apply to affected area once Patient taking differently: Apply 1 application topically See admin instructions. Apply to port when needed 07/12/20  Yes [name], [name], MD  magic mouthwash SOLN Take 15 mLs by mouth 3 (three) times daily.   Yes [provider]  megestrol (MEGACE ES) 625 MG/5ML suspension Take 5 mLs (625 mg total) by mouth daily. 07/22/20  Yes Benay Pike, MD  mirtazapine (REMERON) 15 MG tablet Take 2 tablets (30 mg total) by mouth at bedtime. 09/14/20  Yes Benay Pike, MD  ondansetron (ZOFRAN ODT) 8 MG disintegrating tablet Take 1 tablet (8 mg total) by mouth every 8 (eight) hours as needed for nausea or vomiting. 05/06/20  Yes [name], MD     Signature:  Chesley Mires, MD [place] Pager  - [phone] 09/22/2020, 12:05 PM

## 2020-09-22 NOTE — Evaluation (Signed)
Clinical/Bedside Swallow Evaluation Patient Details  Name: Albert Flowers MRN: [id number] Date of Birth: 09-18-1970  Today's Date: 09/22/2020 Time: SLP Start Time (ACUTE ONLY): 14 SLP Stop Time (ACUTE ONLY): 1130 SLP Time Calculation (min) (ACUTE ONLY): 28 min  Past Medical History:  Past Medical History:  Diagnosis Date   Diabetes mellitus without complication ([place])    Malignant neoplasm of base of tongue ([place]) 06/02/2018   Past Surgical History:  Past Surgical History:  Procedure Laterality Date   IR IMAGING GUIDED PORT INSERTION  07/15/2020   left neck dissection Left 05/04/2019   Left Neck Dissection by Dr. [name] at [facility]    neck sugery     as a child "had a knot removed from neck" not sure which side.    RIght neck dissection Right 06/16/2018   TORS and right neck dissection. Dr. [name] at [facility]   HPI:  50 y.o. 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     Patient woke up this morning with new onset of right-sided sharp-like chest pain, worsening with cough and deep breath.  He also reported subjective fever since last night.  Patient also has a chronic cough which is dry, and he has lost about 20 to 3 pound since January this year.     Patient has had multiple PET scan since last year, previous one showed suspicious right lung nodules/mass, debating between oncology at [place] for aspiration pneumonia (x2) versus malignancy.  However most recent PET showed significant enlargement of the mass on the right lung indicating malignancy.  And patient oncology at [facility] been talking to radiation oncology to set up radiation therapy of the right lung.'s chemotherapy regimen  used to be carboplatin plus 5-FU, immunotherapy with Keytruda. 09/21/20 CXR indicated Similar appearing right lower lobe perihilar masslike opacification with development of right lower lobe peripheral subsegmental atelectasis. Query sequela of aspiration, persistent pneumonia, or pulmonary neoplasm. 2. 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    Assessment / Plan / Recommendation Clinical Impression  Pt seen for clinical swallowing evaluation with delayed cough indicated after solid consumption only, but no other overt s/s of aspiration noted throughout assessment. Pt with hx of head/neck CA and subsequent OP ST for dysphagia.  Pt had MBS in September of 2021 with results indicating mild oropharyngeal dysphagia with recommendations given for breath hold/repetitive swallows intermittently during meals to clear pharyngeal stasis.  Pt penetrated residue during study and this technique assisted during MBS.  Albert Flowers instructed to use liquids prior to PO intake of solids to moisten oral/pharyngeal area to improve transition/propulsion through pharynx.  Pt with overall generalized weakness in setting of recent Covid/hx of head/neck CA which may be resulting in current swallowing strategy needs to compensate for weakness.  Pt remembered these strategies and in agreement to use during PO intake.  Swallowing precautions sheet reviewed and left in room with pt.  Recommend continue Regular/thin liquids.  ST will s/o at this time.  Discussed potential for OP MBS prn if symptoms persist with delayed coughing and swallowing strategies are not successful.  Thank you for this consultation. SLP Visit Diagnosis: Dysphagia, unspecified (R13.10)    Aspiration Risk  Mild aspiration risk;Risk for inadequate nutrition/hydration    Diet Recommendation   Regular/thin liquids  Medication Administration: Whole meds with liquid    Other  Recommendations Oral  Care Recommendations: Patient independent with oral care   Follow up Recommendations Other (comment) (OP SLP prn)      Frequency and Duration  (evaluation only)          Prognosis Prognosis for Safe Diet Advancement: Good      Swallow Study   General Date of Onset: 09/17/20 HPI: 50 y.o. 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     Patient woke up this morning with new onset of right-sided sharp-like chest pain, worsening with cough and deep breath.  He also reported subjective fever since last night.  Patient also has a chronic cough which is dry, and he has lost about 20 to 3 pound since January this year.     Patient has had multiple PET scan since last year, previous one showed suspicious right lung nodules/mass, debating between oncology at [place] for aspiration pneumonia (x2) versus malignancy.  However most recent PET showed significant enlargement of the mass on the right lung indicating malignancy.  And patient oncology at [facility] been talking to radiation oncology to set up radiation therapy of the right lung.'s chemotherapy regimen used to be carboplatin plus 5-FU, immunotherapy with Keytruda. Type of Study: Bedside Swallow Evaluation Previous Swallow Assessment: MBS 11/2019 indicating mild oral pharyngeal dysphagia Diet Prior to this Study: Regular;Thin liquids Temperature Spikes Noted: No Respiratory Status: Room air History of Recent Intubation: No Behavior/Cognition: Alert;Cooperative;Pleasant mood Oral Cavity Assessment: Dry Oral Care Completed by SLP: Other (Comment) (recent completion by pt) Oral Cavity - Dentition: Adequate natural dentition Vision:  Functional for self-feeding Self-Feeding Abilities: Able to feed self Patient Positioning: Upright in bed Baseline Vocal Quality: Hoarse (slight) Volitional Cough: Strong Volitional Swallow: Able to elicit    Oral/Motor/Sensory Function Overall Oral Motor/Sensory Function: Within functional limits   Ice Chips Ice chips: Not tested   Thin Liquid Thin Liquid: Within functional limits Presentation: Cup;Straw    Nectar Thick Nectar Thick Liquid: Not tested   Honey Thick Honey Thick Liquid: Not tested   Puree Puree: Not tested   Solid     Solid: Impaired Presentation: Self Fed Pharyngeal Phase Impairments: Cough - Delayed      Elvina Sidle, M.S., CCC-SLP 09/22/2020,1:36 PM

## 2020-09-22 NOTE — Progress Notes (Signed)
Inpatient Diabetes Program Recommendations  AACE/ADA: New Consensus Statement on Inpatient Glycemic Control (2015)  Target Ranges:  Prepandial:   less than 140 mg/dL      Peak postprandial:   less than 180 mg/dL (1-2 hours)      Critically ill patients:  140 - 180 mg/dL   Lab Results  Component Value Date   GLUCAP 239 (H) 09/22/2020   HGBA1C 7.7 (H) 09/18/2020    Review of Glycemic Control Results for EEAN, BUSS (MRN [id number]) as of 09/22/2020 14:06  Ref. Range 09/21/2020 16:19 09/21/2020 21:27 09/22/2020 06:32 09/22/2020 11:35  Glucose-Capillary Latest Ref Range: 70 - 99 mg/dL 259 (H) 334 (H) 158 (H) 239 (H)   Diabetes history: Type 2 DM Outpatient Diabetes medications: none Current orders for Inpatient glycemic control: Novolog 3 units TID, Novolog 0-15 units TID   Inpatient Diabetes Program Recommendations:     Consider -Levemir 8 units QD -Boost nutritional supplement contains 41 g of CHO per serving. Assess alternatives?  Spoke with patient and wife regarding blood glucose trends while inpatient. Patient verifies that he was diagnosed around two years ago and planned to control with diet, however, blood sugar remained in target goal range because he was experiencing poor oral intake as a result of chemotherapy. Also, of note, patient has been on steroids recently.  Reviewed patient's current A1c of 7.7%. Explained what a A1c is and what it measures. Also reviewed goal A1c with patient, importance of good glucose control @ home, and blood sugar goals. Reviewed patho of DM, role of pancreas, possible side effects of Keytruda to beta cells of pancreas, survival skills, interventions, vascular changes and commorbidities.  Patient will need a glucose meter at discharge. Blood glucose meter kit (#[id number]). Reviewed recommended frequency of blood sugar checks and target goals.  Patient requesting information on [facility]. Education provided on risks vs benefits, application,  recommended location for placement and cost.  Orders received verbally at bedside by Dr Erlinda Hong. Also, discussed plan of care, recommendations and plan for discharge. At discharge, with possibility of steroids in future and along with current glucose trends may want to consider sliding scale insulin for outpatient use. Educated patient and spouse on insulin pen use at home. Reviewed contents of insulin flexpen starter kit. Reviewed all steps if insulin pen including attachment of needle, 2-unit air shot, dialing up dose, giving injection, removing needle, disposal of sharps, storage of unused insulin, disposal of insulin etc. Patient able to provide successful return demonstration. Also reviewed troubleshooting with insulin pen. MD to give patient Rxs for insulin pens and insulin pen needles.  Thanks, Bronson Curb, MSN, RNC-OB Diabetes Coordinator [phone] (8a-5p)

## 2020-09-22 NOTE — Progress Notes (Signed)
Mobility Specialist: Progress Note   09/22/20 1422  Mobility  Activity Ambulated in hall  Level of Assistance Independent  Assistive Device None  Distance Ambulated (ft) 1300 ft  Mobility Ambulated independently in hallway  Mobility Response Tolerated well  Mobility performed by Mobility specialist  $Mobility charge 1 Mobility   Pre-Mobility: 97 HR Post-Mobility: 97 HR, 100% SpO2  Pt asx throughout ambulation. Pt is sitting EOB after walk with RN and family member present in the room.   [facility] 2 Chasidy Janak Mobility Specialist Mobility Specialist Phone: [phone]

## 2020-09-22 NOTE — Progress Notes (Signed)
PROGRESS NOTE    Albert Flowers  MWN:[id number] DOB: 24-Sep-1970 DOA: 09/17/2020 PCP: [name], MD    No chief complaint on file.   Brief Narrative:  Albert Flowers is an 50 y.o. 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 Found to have large right lower lobe mass-like consolidation.      Subjective:   Continue to have nonproductive cough, overall has improved, less tachycardia,  He is seen by speech therapist and diabetes coordinator  Denies chest pain, no edema, no fever, no hypoxia at rest Wife at bedside   Assessment & Plan:   Active Problems:   Community acquired pneumonia of right middle lobe of lung   Lobar pneumonia -Recent history of COVID-19 infection -CT scan on presentation showed right lower lobe masslike consolidation, not able to rule out underlying malignancy -He appear clinically improved on antibiotics, he report history of swallowing difficulties, received swallow eval has mild aspiration risk , aspiration prevention education provided, he is continued on regular diet/thin liquids -Continue current antibiotics -pulmonary consult per oncology recommendation to decide inpatient bronch vs outpatient follow up -We will need to repeat chest CT in a few weeks after antibiotic treatment  Previously diet controlled diabetes A1c 7.7 Noticed recent steroid use Currently on meal coverage and sliding scale insulin Diabetes education, change boost to glucerna, patient agreed to discharge home on sliding scale insulin, will prescribe diabetes supplies and ssi  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 -Hemoglobin 7.5, diastolic appear to have overt external  bleeding -Monitor  metastatic HPV positive squamous cell carcinoma  on treatment with caroplatin and Keytruda Seen by oncology here,    Body mass index is 19.45 kg/m.Marland Kitchen      DVT prophylaxis: enoxaparin (LOVENOX) injection 40 mg Start: 09/17/20 2043   Code Status: Full Family Communication: Wife at bedside Disposition:   Status is: Inpatient  Dispo: The patient is from: Home              Anticipated d/c is to: Home              Anticipated d/c date is: Likely tomorrow                Consultants:  Oncology Pulmonary   Procedures:  None  Antimicrobials:   Anti-infectives (From admission, onward)    Start     Dose/Rate Route Frequency Ordered Stop   09/17/20 1542  azithromycin (ZITHROMAX) 500 mg in sodium chloride 0.9 % 250 mL IVPB        500 mg 250 mL/hr over 60 Minutes Intravenous Every 24 hours 09/17/20 1441     09/17/20 1512  ceFEPIme (MAXIPIME) 2 g in sodium chloride 0.9 % 100 mL IVPB        2 g 200 mL/hr over 30 Minutes Intravenous Every 8 hours 09/17/20 1441     09/17/20 1415  levofloxacin (LEVAQUIN) IVPB 750 mg  Status:  Discontinued        750 mg 100 mL/hr over 90 Minutes Intravenous Every 24 hours 09/17/20 1412 09/17/20 1441   09/17/20 1400  levofloxacin (LEVAQUIN) IVPB 750 mg  Status:  Discontinued        750 mg 100 mL/hr over 90 Minutes Intravenous  Once 09/17/20 1346  09/17/20 1412           Objective: Vitals:   09/21/20 2124 09/22/20 0110 09/22/20 0306 09/22/20 0743  BP: 102/69 106/75 98/73 108/86  Pulse: 92 85 80 89  Resp: 19 20 20 20   Temp: 98.4 F (36.9 C) 98.4 F (36.9 C) 98.2 F (36.8 C)   TempSrc: Oral Oral Oral Oral  SpO2: 100% 100% 100% 100%  Weight:      Height:        Intake/Output Summary (Last 24 hours) at 09/22/2020 1634 Last data filed at 09/22/2020 1236 Gross per 24 hour  Intake 970 ml  Output 700 ml  Net 270 ml   Filed Weights   09/17/20 1316 09/19/20 0346  Weight: 65.8 kg 61.5 kg    Examination:  General exam:  chronically ill appearing calm, NAD Respiratory system: diminished right lower lobe, Respiratory effort normal. Cardiovascular system: S1 & S2 heard, less sinus tachycardia,  No pedal edema. Gastrointestinal system: Abdomen is nondistended, soft and nontender.  Normal bowel sounds heard. Central nervous system: Alert and oriented. No focal neurological deficits. Extremities: generalized weakness, no edema Skin: No rashes, lesions or ulcers Psychiatry: Judgement and insight appear normal. Mood & affect appropriate.     Data Reviewed: I have personally reviewed following labs and imaging studies  CBC: Recent Labs  Lab 09/16/20 1118 09/17/20 1204 09/18/20 0040 09/19/20 0042 09/21/20 0248  WBC 7.7 10.5 8.0 8.9 5.4  NEUTROABS 6.5  --   --   --   --   HGB 8.6* 9.2* 7.4* 8.2* 7.5*  HCT 25.3* 27.2* 22.5* 24.5* 22.0*  MCV 95.1 98.2 97.4 97.6 96.1  PLT 170 204 168 177 017    Basic Metabolic Panel: Recent Labs  Lab 09/16/20 1118 09/17/20 1204 09/18/20 0040 09/19/20 0042 09/21/20 0248  NA 134* 130* 129* 130* 132*  K 3.8 4.2 3.8 4.0 4.0  CL 97* 95* 99 98 101  CO2 27 26 24 25 24   GLUCOSE 320* 208* 219* 179* 213*  BUN 16 12 8 6 6   CREATININE 0.75 0.69 0.54* 0.55* 0.47*  CALCIUM 9.2 8.8* 8.3* 8.7* 8.9    GFR: Estimated Creatinine Clearance: 95 mL/min (A) (by C-G formula based on SCr of 0.47 mg/dL (L)).  Liver Function Tests: Recent Labs  Lab 09/16/20 1118  AST 27  ALT 41  ALKPHOS 140*  BILITOT 0.2*  PROT 7.1  ALBUMIN 2.8*    CBG: Recent Labs  Lab 09/21/20 1100 09/21/20 1619 09/21/20 2127 09/22/20 0632 09/22/20 1135  GLUCAP 227* 259* 334* 158* 239*     Recent Results (from the past 240 hour(s))  Blood Culture (routine x 2)     Status: None   Collection Time: 09/17/20  2:40 PM   Specimen: BLOOD  Result Value Ref Range Status   Specimen Description BLOOD PORTA CATH  Final   Special Requests   Final    BOTTLES DRAWN AEROBIC AND ANAEROBIC Blood Culture  results may not be optimal due to an inadequate volume of blood received in culture bottles   Culture   Final    NO GROWTH 5 DAYS Performed at [facility], [place] [address] [place], [place] [address]    Report Status 09/22/2020 FINAL  Final  MRSA Next Gen by PCR, Nasal     Status: None   Collection Time: 09/17/20  2:41 PM   Specimen: Nasopharyngeal Swab; Nasal Swab  Result Value Ref Range Status   MRSA by PCR Next Gen NOT  DETECTED NOT DETECTED Final    Comment: (NOTE) The GeneXpert MRSA Assay (FDA approved for NASAL specimens only), is one component of a comprehensive MRSA colonization surveillance program. It is not intended to diagnose MRSA infection nor to guide or monitor treatment for MRSA infections. Test performance is not FDA approved in patients less than 50 years old. Performed at [facility], [place] [address] [place], [place] [address]   Blood Culture (routine x 2)     Status: None   Collection Time: 09/17/20  3:00 PM   Specimen: BLOOD  Result Value Ref Range Status   Specimen Description BLOOD RIGHT ANTECUBITAL  Final   Special Requests   Final    BOTTLES DRAWN AEROBIC AND ANAEROBIC Blood Culture results may not be optimal due to an inadequate volume of blood received in culture bottles   Culture   Final    NO GROWTH 5 DAYS Performed at [facility], [place] [address] [place], [place] [address]    Report Status 09/22/2020 FINAL  Final  Resp Panel by RT-PCR (Flu A&B, Covid) Nasopharyngeal Swab     Status: Abnormal   Collection Time: 09/17/20  3:15 PM   Specimen: Nasopharyngeal Swab; Nasopharyngeal(NP) swabs in vial transport medium  Result Value Ref Range Status   SARS Coronavirus 2 by RT PCR POSITIVE (A) NEGATIVE Final    Comment: RESULT CALLED TO, READ BACK BY AND VERIFIED WITH: RN J.COOK ON [id number] AT [id number] BY E.PARRISH (NOTE) SARS-CoV-2 target nucleic acids are DETECTED.  The SARS-CoV-2 RNA is generally detectable in upper  respiratory specimens during the acute phase of infection. Positive results are indicative of the presence of the identified virus, but do not rule out bacterial infection or co-infection with other pathogens not detected by the test. Clinical correlation with patient history and other diagnostic information is necessary to determine patient infection status. The expected result is Negative.  Fact Sheet for Patients: EntrepreneurPulse.com.au  Fact Sheet for Healthcare Providers: IncredibleEmployment.be  This test is not yet approved or cleared by the [place] FDA and  has been authorized for detection and/or diagnosis of SARS-CoV-2 by FDA under an Emergency Use Authorization (EUA).  This EUA will remain in effect (meaning this te st can be used) for the duration of  the COVID-19 declaration under Section 564(b)(1) of the Act, 21 U.S.C. section 360bbb-3(b)(1), unless the authorization is terminated or revoked sooner.     Influenza A by PCR NEGATIVE NEGATIVE Final   Influenza B by PCR NEGATIVE NEGATIVE Final    Comment: (NOTE) The Xpert Xpress SARS-CoV-2/FLU/RSV plus assay is intended as an aid in the diagnosis of influenza from Nasopharyngeal swab specimens and should not be used as a sole basis for treatment. Nasal washings and aspirates are unacceptable for Xpert Xpress SARS-CoV-2/FLU/RSV testing.  Fact Sheet for Patients: EntrepreneurPulse.com.au  Fact Sheet for Healthcare Providers: IncredibleEmployment.be  This test is not yet approved or cleared by the [place] FDA and has been authorized for detection and/or diagnosis of SARS-CoV-2 by FDA under an Emergency Use Authorization (EUA). This EUA will remain in effect (meaning this test can be used) for the duration of the COVID-19 declaration under Section 564(b)(1) of the Act, 21 U.S.C. section 360bbb-3(b)(1), unless the authorization is  terminated or revoked.  Performed at [facility], [place] [address] [place], [place] [address]   Urine culture     Status: None   Collection Time: 09/17/20  9:40 PM   Specimen: In/Out Cath  Urine  Result Value Ref Range Status   Specimen Description IN/OUT CATH URINE  Final   Special Requests NONE  Final   Culture   Final    NO GROWTH Performed at [facility], [address], [place], [place] [address]    Report Status 09/19/2020 FINAL  Final         Radiology Studies: DG Chest 2 View  Result Date: 09/21/2020 CLINICAL DATA:  50 year old male with shortness of breath, weakness. History of head neck cancer. EXAM: CHEST - 2 VIEW COMPARISON:  09/17/2020 FINDINGS: Right chest wall Port-A-Cath in place with catheter tip near the cavoatrial junction, unchanged. The mediastinal contours are within normal limits. No cardiomegaly. Similar appearing right lower lobe perihilar opacity. Interval development of streaky peripheral opacities in the right lung base. The left lung is clear. No pleural effusion or pneumothorax. Similar appearing sclerotic opacity in the proximal right humerus. IMPRESSION: 1. Similar appearing right lower lobe perihilar masslike opacification with development of right lower lobe peripheral subsegmental atelectasis. Query sequela of aspiration, persistent pneumonia, or pulmonary neoplasm. 2. 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 Electronically Signed   By: Ruthann Cancer MD   On: 09/21/2020 08:40        Scheduled Meds:  benzonatate  200 mg Oral TID   Chlorhexidine Gluconate Cloth  6 each Topical Daily   diphenhydrAMINE  25 mg Oral QHS   docusate sodium  100 mg Oral BID   enoxaparin (LOVENOX) injection  40 mg Subcutaneous Q24H   feeding supplement (GLUCERNA SHAKE)  237 mL Oral TID BM   gabapentin  900 mg Oral TID   insulin aspart  0-15 Units Subcutaneous TID WC   insulin aspart  3 Units Subcutaneous TID WC   Ipratropium-Albuterol   1 puff Inhalation TID   magic mouthwash  15 mL Oral TID   megestrol  800 mg Oral Daily   mirtazapine  30 mg Oral QHS   polyethylene glycol  17 g Oral Daily   Continuous Infusions:  azithromycin 500 mg (09/22/20 1119)   ceFEPime (MAXIPIME) IV 2 g (09/22/20 1427)     LOS: 5 days   Time spent: 58mins Greater than 50% of this time was spent in counseling, explanation of diagnosis, planning of further management, and coordination of care.   Voice Recognition Viviann Spare dictation system was used to create this note, attempts have been made to correct errors. Please contact the author with questions and/or clarifications.   Florencia Reasons, MD PhD FACP [facility]  Available via Epic secure chat 7am-7pm for nonurgent issues Please page for urgent issues To page the attending provider between 7A-7P or the covering provider during after hours 7P-7A, please log into the web site [email or web address] and access using universal [place] password for that web site. If you do not have the password, please call the hospital operator.    09/22/2020, 4:34 PM

## 2020-09-22 NOTE — Plan of Care (Signed)
  Problem: Health Behavior/Discharge Planning: Goal: Ability to manage health-related needs will improve Outcome: Progressing   Problem: Clinical Measurements: Goal: Ability to maintain clinical measurements within normal limits will improve Outcome: Progressing Goal: Will remain free from infection Outcome: Progressing Goal: Diagnostic test results will improve Outcome: Progressing   

## 2020-09-23 DIAGNOSIS — R627 Adult failure to thrive: Secondary | ICD-10-CM

## 2020-09-23 DIAGNOSIS — D649 Anemia, unspecified: Secondary | ICD-10-CM

## 2020-09-23 LAB — CBC WITH DIFFERENTIAL/PLATELET
Abs Immature Granulocytes: 0.22 10*3/uL — ABNORMAL HIGH (ref 0.00–0.07)
Basophils Absolute: 0 10*3/uL (ref 0.0–0.1)
Basophils Relative: 0 %
Eosinophils Absolute: 0 10*3/uL (ref 0.0–0.5)
Eosinophils Relative: 0 %
HCT: 21.8 % — ABNORMAL LOW (ref 39.0–52.0)
Hemoglobin: 7.2 g/dL — ABNORMAL LOW (ref 13.0–17.0)
Immature Granulocytes: 5 %
Lymphocytes Relative: 6 %
Lymphs Abs: 0.3 10*3/uL — ABNORMAL LOW (ref 0.7–4.0)
MCH: 32.6 pg (ref 26.0–34.0)
MCHC: 33 g/dL (ref 30.0–36.0)
MCV: 98.6 fL (ref 80.0–100.0)
Monocytes Absolute: 0.5 10*3/uL (ref 0.1–1.0)
Monocytes Relative: 10 %
Neutro Abs: 3.6 10*3/uL (ref 1.7–7.7)
Neutrophils Relative %: 79 %
Platelets: 179 10*3/uL (ref 150–400)
RBC: 2.21 MIL/uL — ABNORMAL LOW (ref 4.22–5.81)
RDW: 16.4 % — ABNORMAL HIGH (ref 11.5–15.5)
WBC: 4.5 10*3/uL (ref 4.0–10.5)
nRBC: 0 % (ref 0.0–0.2)

## 2020-09-23 LAB — EXPECTORATED SPUTUM ASSESSMENT W GRAM STAIN, RFLX TO RESP C

## 2020-09-23 LAB — BASIC METABOLIC PANEL
Anion gap: 6 (ref 5–15)
BUN: 7 mg/dL (ref 6–20)
CO2: 27 mmol/L (ref 22–32)
Calcium: 8.9 mg/dL (ref 8.9–10.3)
Chloride: 100 mmol/L (ref 98–111)
Creatinine, Ser: 0.44 mg/dL — ABNORMAL LOW (ref 0.61–1.24)
GFR, Estimated: >60 mL/min (ref >60)
Glucose, Bld: 199 mg/dL — ABNORMAL HIGH (ref 70–99)
Potassium: 4.3 mmol/L (ref 3.5–5.1)
Sodium: 133 mmol/L — ABNORMAL LOW (ref 135–145)

## 2020-09-23 LAB — MAGNESIUM: Magnesium: 2 mg/dL (ref 1.7–2.4)

## 2020-09-23 LAB — GLUCOSE, CAPILLARY
Glucose-Capillary: 166 mg/dL — ABNORMAL HIGH (ref 70–99)
Glucose-Capillary: 211 mg/dL — ABNORMAL HIGH (ref 70–99)
Glucose-Capillary: 274 mg/dL — ABNORMAL HIGH (ref 70–99)

## 2020-09-23 LAB — PREPARE RBC (CROSSMATCH)

## 2020-09-23 MED ORDER — SODIUM CHLORIDE 0.9% IV SOLUTION: Freq: Once | INTRAVENOUS | Status: AC

## 2020-09-23 MED ORDER — GUAIFENESIN 100 MG/5ML PO SOLN
5.0000 mL | ORAL | 0 refills | Status: DC | PRN
Start: 2020-09-23 — End: 2020-10-03

## 2020-09-23 MED ORDER — HEPARIN SOD (PORK) LOCK FLUSH 100 UNIT/ML IV SOLN
500.0000 [IU] | INTRAVENOUS | Status: AC | PRN
Start: 2020-09-23 — End: 2020-09-23
  Administered 2020-09-23: 500 [IU]
  Filled 2020-09-23: qty 5

## 2020-09-23 MED ORDER — GLUCERNA SHAKE PO LIQD: 237.0000 mL | Freq: Three times a day (TID) | ORAL | 0 refills | Status: DC

## 2020-09-23 MED ORDER — INSULIN ASPART 100 UNIT/ML FLEXPEN: PEN_INJECTOR | SUBCUTANEOUS | 0 refills | Status: DC

## 2020-09-23 MED ORDER — SACCHAROMYCES BOULARDII 250 MG PO CAPS: 250.0000 mg | ORAL_CAPSULE | Freq: Two times a day (BID) | ORAL | 0 refills | Status: AC

## 2020-09-23 MED ORDER — DIPHENHYDRAMINE HCL 25 MG PO TABS: 25.0000 mg | ORAL_TABLET | Freq: Every evening | ORAL | Status: DC

## 2020-09-23 MED ORDER — BLOOD GLUCOSE MONITOR KIT: PACK | 0 refills | Status: DC

## 2020-09-23 MED ORDER — INSULIN PEN NEEDLE 29G X 5MM MISC
0 refills | Status: DC
Start: 2020-09-23 — End: 2021-01-11

## 2020-09-23 MED ORDER — LEVOFLOXACIN 750 MG PO TABS: 750.0000 mg | ORAL_TABLET | Freq: Every day | ORAL | 0 refills | Status: AC

## 2020-09-23 NOTE — Plan of Care (Signed)
  Problem: Health Behavior/Discharge Planning: Goal: Ability to manage health-related needs will improve Outcome: Progressing   Problem: Clinical Measurements: Goal: Will remain free from infection Outcome: Progressing   

## 2020-09-23 NOTE — Progress Notes (Signed)
Medication/discharge instruction given to pt and wife. Iv team consulted to de assess port a cath. Pt received 1 Unit of blood. Will continue to monitor until D/C   Phoebe Sharps, RN

## 2020-09-23 NOTE — Discharge Summary (Addendum)
Discharge Summary  Albert Flowers TGP:[id number] DOB: 1971-03-03  PCP: [name], MD  Admit date: 09/17/2020 Discharge date: 09/23/2020  Time spent: 21mns, more than 50% time spent on coordination of care.   Recommendations for Outpatient Follow-up:  F/u with PCP within a week  for hospital discharge follow up, repeat cbc/bmp at follow up F/u with oncology as scheduled  F/u with pulmonology as scheduled     Discharge Diagnoses:  Active Hospital Problems   Diagnosis Date Noted   Community acquired pneumonia of right middle lobe of lung 09/17/2020    Resolved Hospital Problems  No resolved problems to display.    Discharge Condition: stable  Diet recommendation: carb modified  Filed Weights   09/17/20 1316 09/19/20 0346  Weight: 65.8 kg 61.5 kg    History of present illness: ( per admitting MD Dr ZRoosevelt Locks Chief Complaint: Chest pain   HPI: BPercival Glasheenis a 50y.o. 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   Patient woke up this morning with new onset of right-sided sharp-like chest pain, worsening with cough and deep breath.  He also reported subjective fever since last night.  Patient also has a chronic cough which is dry, and he has lost about 20 to 3 pound since January this year.   Patient has had multiple PET scan since last year, previous one showed suspicious right lung nodules/mass, [facility].  However most recent PET showed significant enlargement of the mass on the right lung indicating malignancy.  [facility]  [facility] radiation therapy of the right lung.'s chemotherapy regimen used to be carboplatin plus 5-FU, immunotherapy with Keytruda.  5-FU was dropped recently due to severe recurrent mucositis.     ED Course: Fever 101.9. CT angiogram showed right-sided lung mass overlying with possible pneumonia.    Hospital Course:  Active Problems:   Community acquired pneumonia of right middle lobe of lung   Lobar pneumonia -Recent history of COVID-19 infection -CT A no PE,  showed right lower lobe masslike consolidation, not able to rule out underlying malignancy --he report history of swallowing difficulties, received swallow eval has mild aspiration risk , aspiration prevention education provided, he is continued on regular diet/thin liquids -clinically improved on antibiotics, chest pain resolved, no hypoxia -pulmonary input appreciated, patient is to follow up with pulm outpatient on 7/12 -need to repeat chest imaging in a few weeks after antibiotic treatment -he received cefepime and zithro in the hospital, he is discharged on levaquinx3 days to finish total of 10days abx treatment.   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 -Hemoglobin 7.2 on 7/8, no overt sign of bleeding -one unit prbc  x1 per patient/wife request -f/u with hematology/oncology    metastatic HPV positive squamous cell carcinoma  on treatment with carboplatin and Keytruda Seen by oncology here, input appreciated, he is to follow up with oncology closely     Body mass index is 19.45 kg/m..Albert Flowers  DVT prophylaxis: enoxaparin (LOVENOX) injection 40 mg Start: 09/17/20 2043     Code Status: Full Family Communication: Wife at bedside    Procedures: none  Consultations: Oncology Pulmonology    Discharge  Exam: BP 112/70 (BP Location: Left Arm)   Pulse 93   Temp 97.7 F (36.5 C) (Oral)   Resp 19   Ht 5' 10" (1.778 m)   Wt 61.5 kg   SpO2 100%   BMI 19.45 kg/m   General: frail, chronically ill appearing, NAD Cardiovascular: RRR Respiratory: diminished at basis, no wheezing, no rales, no rhonchi, normal respiratory effort   Discharge Instructions You were cared for by a hospitalist during your hospital stay. If you have any questions about your discharge medications or the care you received while you were in the hospital after you are discharged, you can call the unit and asked to speak with the hospitalist on call if the hospitalist that took care of you is not available. Once you are discharged, your primary care physician will handle any further medical issues. Please note that NO REFILLS for any discharge medications will be authorized once you are discharged, as it is imperative that you return to your primary care physician (or establish a relationship with a primary care physician if you do not have one) for your aftercare needs so that they can reassess your need for medications and monitor your lab values.  Discharge Instructions     Diet Carb Modified   Complete by: As directed    Increase activity slowly   Complete by: As directed       Allergies as of 09/23/2020       Reactions   Penicillins Other (See Comments)   Unknown reaction        Medication List     TAKE these medications    blood glucose meter kit and supplies Kit Dispense based on patient and insurance preference. Use up to four times daily as directed.   dexamethasone 4 MG tablet Commonly known as: DECADRON Take 2 tablets (8 mg total) by mouth daily. Start the day after carboplatin chemotherapy for 3 days.   diphenhydrAMINE 25 MG tablet Commonly known as: BENADRYL Take 1 tablet (25 mg total) by mouth at bedtime.   feeding supplement (GLUCERNA SHAKE) Liqd Take 237 mLs by mouth 3 (three) times  daily between meals.   gabapentin 300 MG capsule Commonly known as: NEURONTIN Take 900 mg by mouth 3 (three) times daily.   guaiFENesin 100 MG/5ML Soln Commonly known as: ROBITUSSIN Take 5 mLs (100 mg total) by mouth every 4 (four) hours as needed for cough or to loosen phlegm.   insulin aspart 100 UNIT/ML FlexPen Commonly known as: NOVOLOG Before each meal 3 times a day, 140-199 - 2 units, 200-250 - 4 units, 251-299 - 6 units,  300-349 - 8 units,  350 or above 10 units. Insulin PEN if approved, provide syringes and needles if needed.   Insulin Pen Needle 29G X 5MM Misc For sliding scale insulin injection   levofloxacin 750 MG tablet Commonly known as: Levaquin Take 1 tablet (750 mg total) by mouth daily for 3 days.   lidocaine-prilocaine cream Commonly known as: EMLA Apply to affected area once What changed:  how much to take how to take this when to take this additional instructions   magic mouthwash Soln Take 15 mLs by mouth 3 (three) times daily.   megestrol 625 MG/5ML suspension Commonly known as: MEGACE ES Take 5 mLs (625  mg total) by mouth daily.   mirtazapine 15 MG tablet Commonly known as: REMERON Take 2 tablets (30 mg total) by mouth at bedtime.   ondansetron 8 MG disintegrating tablet Commonly known as: Zofran ODT Take 1 tablet (8 mg total) by mouth every 8 (eight) hours as needed for nausea or vomiting.   saccharomyces boulardii 250 MG capsule Commonly known as: FLORASTOR Take 1 capsule (250 mg total) by mouth 2 (two) times daily for 7 days.       Allergies  Allergen Reactions   Penicillins Other (See Comments)    Unknown reaction    Follow-up Information     Collene Gobble, MD Follow up on 09/27/2020.   Specialty: Pulmonary Disease Why: Follow up with lung doctor at 9:30 am.  You will need a chest xray done in the office before meeting Dr. Lamonte Sakai. Contact information: [place] [address] [phone]          [name], MD Follow up.   Specialty: Family Medicine Contact information: [place] [address] [phone]                  The results of significant diagnostics from this hospitalization (including imaging, microbiology, ancillary and laboratory) are listed below for reference.    Significant Diagnostic Studies: DG Chest 2 View  Result Date: 09/21/2020 CLINICAL DATA:  50 year old male with shortness of breath, weakness. History of head neck cancer. EXAM: CHEST - 2 VIEW COMPARISON:  09/17/2020 FINDINGS: Right chest wall Port-A-Cath in place with catheter tip near the cavoatrial junction, unchanged. The mediastinal contours are within normal limits. No cardiomegaly. Similar appearing right lower lobe perihilar opacity. Interval development of streaky peripheral opacities in the right lung base. The left lung is clear. No pleural effusion or pneumothorax. Similar appearing sclerotic opacity in the proximal right humerus. IMPRESSION: 1. Similar appearing right lower lobe perihilar masslike opacification with development of right lower lobe peripheral subsegmental atelectasis. Query sequela of aspiration, persistent pneumonia, or pulmonary neoplasm. 2. 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 Electronically Signed   By: Ruthann Cancer MD   On: 09/21/2020 08:40   DG Chest 2 View  Result Date: 09/17/2020 CLINICAL DATA:  RIGHT chest pain with cough. History of head and neck cancer. EXAM: CHEST - 2 VIEW COMPARISON:  09/15/2020 PET-CT, 08/28/2020 chest radiograph and prior studies FINDINGS: RIGHT MEDIAL basilar opacity/consolidation appears increased from recent studies. Cardiomediastinal silhouette is unchanged. No pleural effusion or pneumothorax identified. A RIGHT Port-A-Cath with tip overlying SUPERIOR cavoatrial junction is again noted. Sclerotic lesion involving anterior LEFT 4th rib is again noted. IMPRESSION: RIGHT MEDIAL basilar  opacity/consolidation, increased from recent studies and may represent infection, aspiration and/or atelectasis. No other significant change. Electronically Signed   By: Margarette Canada M.D.   On: 09/17/2020 12:27   CT Angio Chest PE W and/or Wo Contrast  Result Date: 09/17/2020 CLINICAL DATA:  50 year old male with cough, shortness of breath and RIGHT chest pain. EXAM: CT ANGIOGRAPHY CHEST WITH CONTRAST TECHNIQUE: Multidetector CT imaging of the chest was performed using the standard protocol during bolus administration of intravenous contrast. Multiplanar CT image reconstructions and MIPs were obtained to evaluate the vascular anatomy. CONTRAST:  85m OMNIPAQUE IOHEXOL 350 MG/ML SOLN COMPARISON:  09/15/2020 PET CT FINDINGS: Cardiovascular: Satisfactory opacification of the pulmonary arteries to the segmental level. No evidence of pulmonary embolism. Normal heart size. No pericardial effusion. Mediastinum/Nodes: No significant change. Enlarged RIGHT hilar lymph  nodes are identified. Lungs/Pleura: Masslike consolidation within the RIGHT LOWER lobe now measures 10.3 x 5.1 cm, previously 2.7 x 2.5 cm on 09/15/2020 study. A 12 mm RIGHT middle lobe nodule is unchanged. A small RIGHT pleural effusion is now identified. No pneumothorax or LEFT pleural effusion. Upper Abdomen: No acute abnormality. Musculoskeletal: Sclerotic lesions within bilateral ribs, proximal RIGHT humerus, sternum and thoracic spine again noted Review of the MIP images confirms the above findings. IMPRESSION: 1. No evidence of pulmonary emboli. 2. Rapid increase in large RIGHT LOWER lobe masslike consolidation since 16/12/9602 almost certainly related to infection/pneumonia given significant change over 2 days. Component of underlying malignancy is not excluded however. New small RIGHT pleural effusion. 3. Unchanged 12 mm RIGHT middle lobe nodule and sclerotic lesions within bilateral ribs, proximal RIGHT humerus, sternum and thoracic spine compatible  with metastatic disease. Electronically Signed   By: Margarette Canada M.D.   On: 09/17/2020 17:02   NM PET Image Restag (PS) Skull Base To Thigh  Result Date: 09/16/2020 CLINICAL DATA:  Subsequent treatment strategy for head neck cancer. EXAM: NUCLEAR MEDICINE PET SKULL BASE TO THIGH TECHNIQUE: 6.89 mCi F-18 FDG was injected intravenously. Full-ring PET imaging was performed from the skull base to thigh after the radiotracer. CT data was obtained and used for attenuation correction and anatomic localization. Fasting blood glucose: 136 mg/dl COMPARISON:  PET-CT 05/04/2020 FINDINGS: Mediastinal blood pool activity: SUV max 2.25 Liver activity: SUV max NA NECK: No hypermetabolic lymph nodes in the neck. Incidental CT findings: none CHEST: Inferior to the RIGHT hilum in the medial aspect of the RIGHT lower lobe there is new hypermetabolic consolidation measuring 2.7 cm with SUV max equal 6.2 on image 90. There is newly enlarged hypermetabolic nodule in the RIGHT middle lobe measuring 12 mm (image 41/CT series 8) with SUV max equal 5.85 ( fused data set image 93). There is interval resolution in the previously seen patchy ground-glass nodularity in the RIGHT upper lobe. Decreased metabolic activity of RIGHT hilar lymph node. Incidental CT findings: none ABDOMEN/PELVIS: The large hypermetabolic lesion in the inferior RIGHT hepatic lobe is improved significantly with minimal residual metabolic activity (SUV max equal 3.6 decreased from SUV max equal 11.5). The lesion is measurably smaller measuring 20 mm (image 129 CT series 4) compared with 35 mm. No new hepatic lesions. Incidental CT findings: none SKELETON: Marked improvement skeletal metastasis. Hypermetabolic lesions in the sacrum and bilateral iliac bones have completely resolved. Metabolic activity at the L3 vertebral body has near completely resolved and now normal background level. Sclerotic bone lesion remaining. Likewise intense radiotracer activity was present  previously in the RIGHT humeral head with SUV max equal 10.1 now reduced to SUV max equal 4.4. Sclerosis of the bone has increased. Incidental CT findings: none IMPRESSION: 1. Mixed response to therapy. 2. Marked improvement in skeletal metastasis with near resolution of metabolic activity of previous multifocal skeletal metastasis. Sclerotic lesions remain in the underlying bone. 3. Marked improvement in solitary hypermetabolic hepatic metastasis reduced in metabolic activity and size. 4. Two new lesions in the RIGHT lung which are hypermetabolic. One RIGHT upper lobe nodule and a new focus of hypermetabolic nodular consolidation in the RIGHT infrahilar lower lobe. 5. Improvement in ground-glass nodularity in the RIGHT upper lobe suggest resolved pulmonary infection. Electronically Signed   By: Suzy Bouchard M.D.   On: 09/16/2020 09:03   DG Chest Portable 1 View  Result Date: 08/28/2020 CLINICAL DATA:  Chemotherapy for cancer.  Cough. EXAM: PORTABLE CHEST 1  VIEW COMPARISON:  None. FINDINGS: A right Port-A-Cath terminates in the central SVC. No pneumothorax. The heart, hila, mediastinum are normal. No pulmonary nodules or masses. No focal infiltrates. IMPRESSION: No active disease. Electronically Signed   By: Dorise Bullion III M.D   On: 08/28/2020 20:05    Microbiology: Recent Results (from the past 240 hour(s))  Blood Culture (routine x 2)     Status: None   Collection Time: 09/17/20  2:40 PM   Specimen: BLOOD  Result Value Ref Range Status   Specimen Description BLOOD PORTA CATH  Final   Special Requests   Final    BOTTLES DRAWN AEROBIC AND ANAEROBIC Blood Culture results may not be optimal due to an inadequate volume of blood received in culture bottles   Culture   Final    NO GROWTH 5 DAYS Performed at [facility], [place] [address], [place], [place] [address]    Report Status 09/22/2020 FINAL  Final  MRSA Next Gen by PCR, Nasal     Status: None   Collection Time: 09/17/20  2:41 PM    Specimen: Nasopharyngeal Swab; Nasal Swab  Result Value Ref Range Status   MRSA by PCR Next Gen NOT DETECTED NOT DETECTED Final    Comment: (NOTE) The GeneXpert MRSA Assay (FDA approved for NASAL specimens only), is one component of a comprehensive MRSA colonization surveillance program. It is not intended to diagnose MRSA infection nor to guide or monitor treatment for MRSA infections. Test performance is not FDA approved in patients less than 72 years old. Performed at [facility], [place] [address], [place], [place] [address]   Blood Culture (routine x 2)     Status: None   Collection Time: 09/17/20  3:00 PM   Specimen: BLOOD  Result Value Ref Range Status   Specimen Description BLOOD RIGHT ANTECUBITAL  Final   Special Requests   Final    BOTTLES DRAWN AEROBIC AND ANAEROBIC Blood Culture results may not be optimal due to an inadequate volume of blood received in culture bottles   Culture   Final    NO GROWTH 5 DAYS Performed at [facility], [place] [address], [place], [place] [address]    Report Status 09/22/2020 FINAL  Final  Resp Panel by RT-PCR (Flu A&B, Covid) Nasopharyngeal Swab     Status: Abnormal   Collection Time: 09/17/20  3:15 PM   Specimen: Nasopharyngeal Swab; Nasopharyngeal(NP) swabs in vial transport medium  Result Value Ref Range Status   SARS Coronavirus 2 by RT PCR POSITIVE (A) NEGATIVE Final    Comment: RESULT CALLED TO, READ BACK BY AND VERIFIED WITH: RN J.COOK ON [id number] AT [id number] BY E.PARRISH (NOTE) SARS-CoV-2 target nucleic acids are DETECTED.  The SARS-CoV-2 RNA is generally detectable in upper respiratory specimens during the acute phase of infection. Positive results are indicative of the presence of the identified virus, but do not rule out bacterial infection or co-infection with other pathogens not detected by the test. Clinical correlation with patient history and other diagnostic information is necessary to determine  patient infection status. The expected result is Negative.  Fact Sheet for Patients: EntrepreneurPulse.com.au  Fact Sheet for Healthcare Providers: IncredibleEmployment.be  This test is not yet approved or cleared by the [place] FDA and  has been authorized for detection and/or diagnosis of SARS-CoV-2 by FDA under an Emergency Use Authorization (EUA).  This EUA will remain in effect (meaning this te st can be used) for the duration  of  the COVID-19 declaration under Section 564(b)(1) of the Act, 21 U.S.C. section 360bbb-3(b)(1), unless the authorization is terminated or revoked sooner.     Influenza A by PCR NEGATIVE NEGATIVE Final   Influenza B by PCR NEGATIVE NEGATIVE Final    Comment: (NOTE) The Xpert Xpress SARS-CoV-2/FLU/RSV plus assay is intended as an aid in the diagnosis of influenza from Nasopharyngeal swab specimens and should not be used as a sole basis for treatment. Nasal washings and aspirates are unacceptable for Xpert Xpress SARS-CoV-2/FLU/RSV testing.  Fact Sheet for Patients: EntrepreneurPulse.com.au  Fact Sheet for Healthcare Providers: IncredibleEmployment.be  This test is not yet approved or cleared by the [place] FDA and has been authorized for detection and/or diagnosis of SARS-CoV-2 by FDA under an Emergency Use Authorization (EUA). This EUA will remain in effect (meaning this test can be used) for the duration of the COVID-19 declaration under Section 564(b)(1) of the Act, 21 U.S.C. section 360bbb-3(b)(1), unless the authorization is terminated or revoked.  Performed at [facility], [place] [address] [place], [place] [address]   Urine culture     Status: None   Collection Time: 09/17/20  9:40 PM   Specimen: In/Out Cath Urine  Result Value Ref Range Status   Specimen Description IN/OUT CATH URINE  Final   Special Requests NONE  Final   Culture   Final    NO  GROWTH Performed at [facility], [place] [address], [place], [place] [address]    Report Status 09/19/2020 FINAL  Final     Labs: Basic Metabolic Panel: Recent Labs  Lab 09/17/20 1204 09/18/20 0040 09/19/20 0042 09/21/20 0248 09/23/20 0434  NA 130* 129* 130* 132* 133*  K 4.2 3.8 4.0 4.0 4.3  CL 95* 99 98 101 100  CO2 _0 GLUCOSE 208* 219* 179* 213* 199*  BUN _1 CREATININE 0.69 0.54* 0.55* 0.47* 0.44*  CALCIUM 8.8* 8.3* 8.7* 8.9 8.9  MG  --   --   --   --  2.0   Liver Function Tests: No results for input(s): AST, ALT, ALKPHOS, BILITOT, PROT, ALBUMIN in the last 168 hours.  No results for input(s): LIPASE, AMYLASE in the last 168 hours. No results for input(s): AMMONIA in the last 168 hours. CBC: Recent Labs  Lab 09/17/20 1204 09/18/20 0040 09/19/20 0042 09/21/20 0248 09/23/20 0434  WBC 10.5 8.0 8.9 5.4 4.5  NEUTROABS  --   --   --   --  3.6  HGB 9.2* 7.4* 8.2* 7.5* 7.2*  HCT 27.2* 22.5* 24.5* 22.0* 21.8*  MCV 98.2 97.4 97.6 96.1 98.6  PLT 204 168 177 168 179   Cardiac Enzymes: No results for input(s): CKTOTAL, CKMB, CKMBINDEX, TROPONINI in the last 168 hours. BNP: BNP (last 3 results) No results for input(s): BNP in the last 8760 hours.  ProBNP (last 3 results) No results for input(s): PROBNP in the last 8760 hours.  CBG: Recent Labs  Lab 09/22/20 1135 09/22/20 1641 09/22/20 2202 09/23/20 0626 09/23/20 1110  GLUCAP 239* 250* 299* 166* 211*       Signed:  Florencia Reasons MD, PhD, FACP  [facility] 09/23/2020, 1:56 PM

## 2020-09-23 NOTE — Progress Notes (Signed)
Port a cath was occluded and unable to flush. Pt's nurse told that she could draw some blood and flushed earlier, but not any more. Deaccessed port a cath and reaccessed port a cath. Now blood return noted and flush well. Pt's nurse made aware of it. HS [employer]

## 2020-09-24 LAB — BPAM RBC
Blood Product Expiration Date: 202208052359
ISSUE DATE / TIME: 202207081513
Unit Type and Rh: 5100

## 2020-09-24 LAB — TYPE AND SCREEN
ABO/RH(D): O POS
Antibody Screen: NEGATIVE
Unit division: 0

## 2020-09-26 ENCOUNTER — Ambulatory Visit: Payer: Managed Care, Other (non HMO)

## 2020-09-26 ENCOUNTER — Other Ambulatory Visit: Payer: Self-pay

## 2020-09-26 DIAGNOSIS — J189 Pneumonia, unspecified organism: Secondary | ICD-10-CM

## 2020-09-27 ENCOUNTER — Other Ambulatory Visit: Payer: Self-pay

## 2020-09-27 ENCOUNTER — Ambulatory Visit: Payer: Managed Care, Other (non HMO)

## 2020-09-27 ENCOUNTER — Encounter: Payer: Self-pay | Admitting: Emergency Medicine

## 2020-09-27 ENCOUNTER — Other Ambulatory Visit: Payer: Managed Care, Other (non HMO)

## 2020-09-27 ENCOUNTER — Ambulatory Visit (INDEPENDENT_AMBULATORY_CARE_PROVIDER_SITE_OTHER): Payer: Managed Care, Other (non HMO)

## 2020-09-27 ENCOUNTER — Ambulatory Visit (INDEPENDENT_AMBULATORY_CARE_PROVIDER_SITE_OTHER): Payer: Managed Care, Other (non HMO) | Admitting: Emergency Medicine

## 2020-09-27 DIAGNOSIS — J189 Pneumonia, unspecified organism: Secondary | ICD-10-CM

## 2020-09-27 DIAGNOSIS — R9389 Abnormal findings on diagnostic imaging of other specified body structures: Secondary | ICD-10-CM | POA: Diagnosis not present

## 2020-09-27 IMAGING — DX DG CHEST 2V
2 series · 2 of 2 positions shown · non-contrast
Comparison: [DATE]

CLINICAL DATA: Pneumonia

EXAM:
CHEST - 2 VIEW

[chest pa]
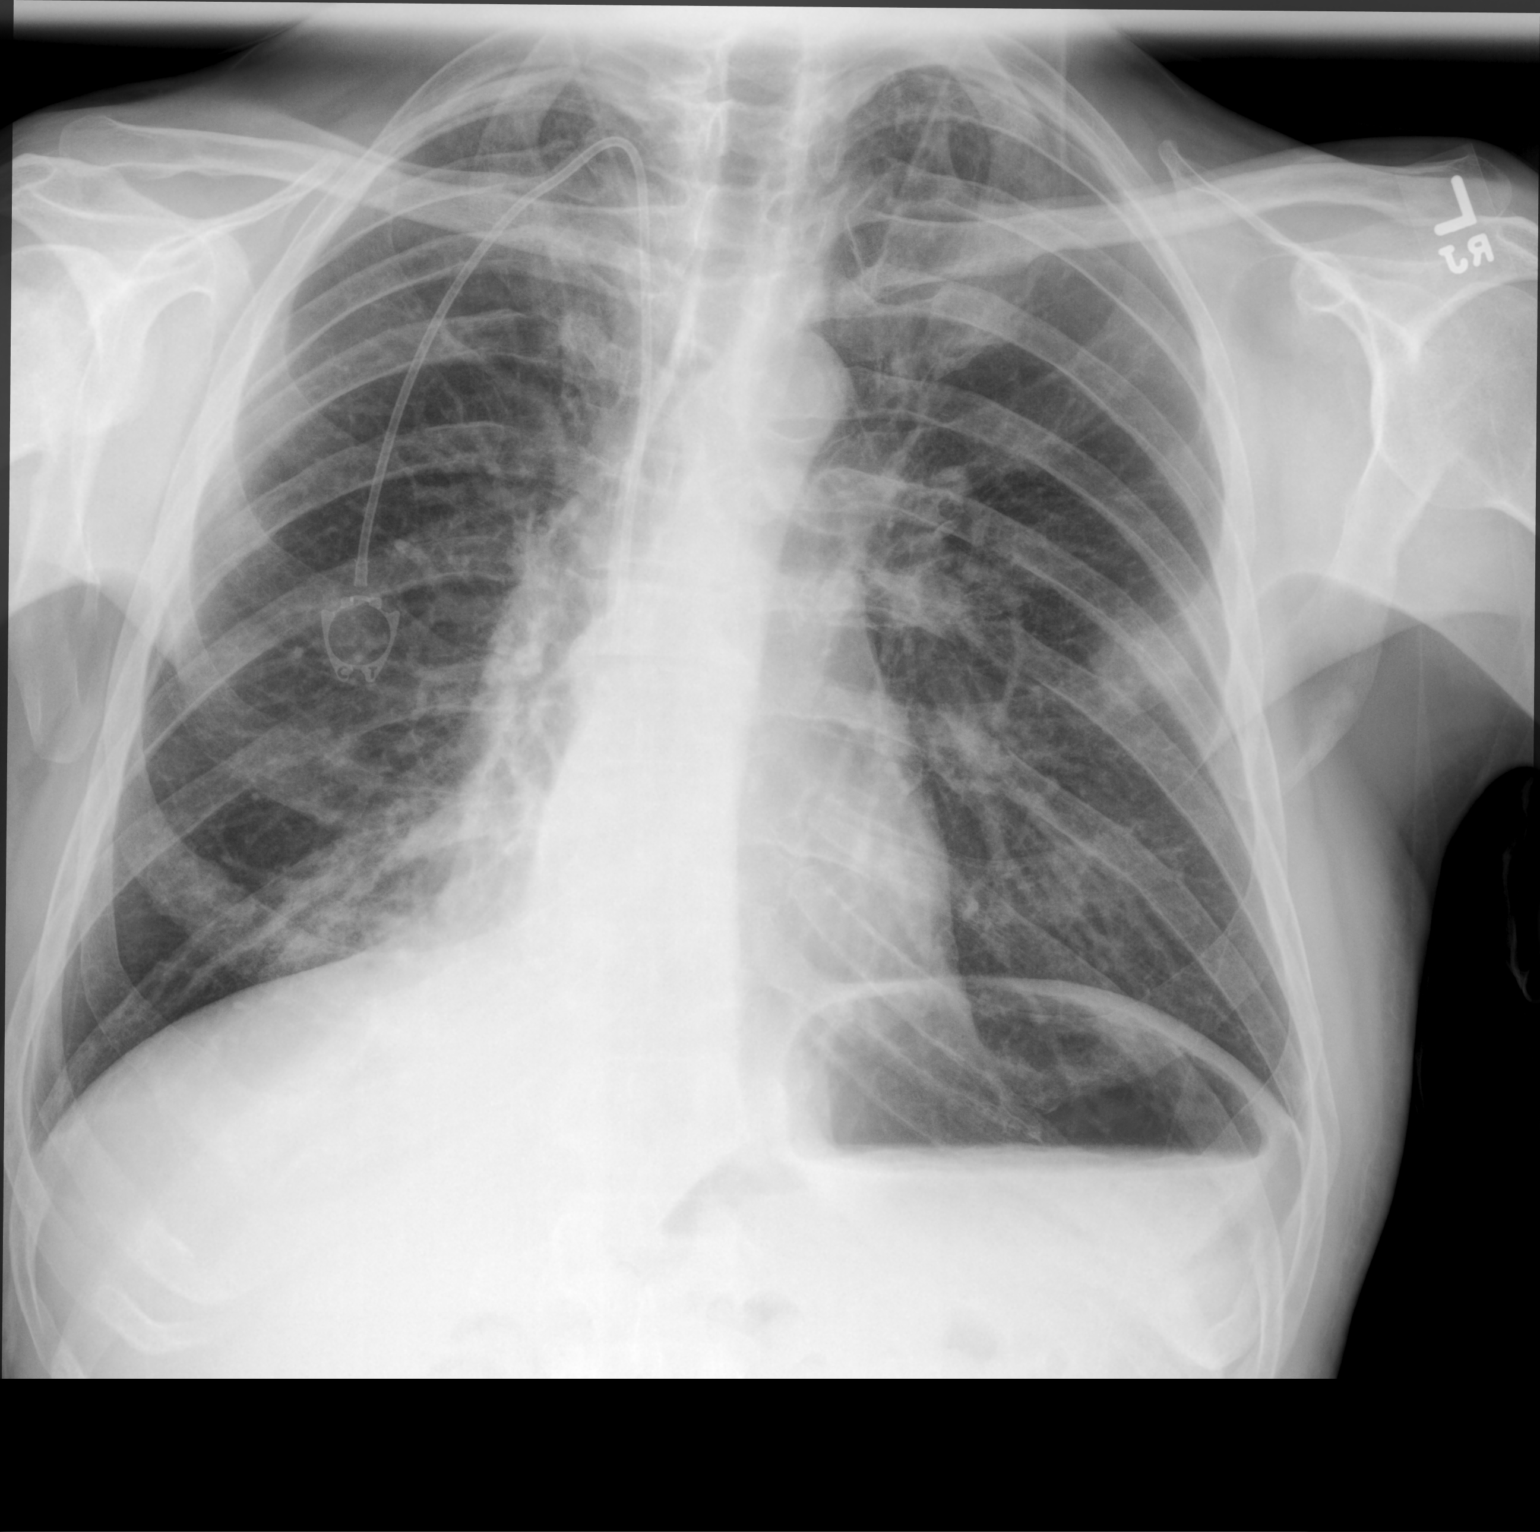

[chest lat]
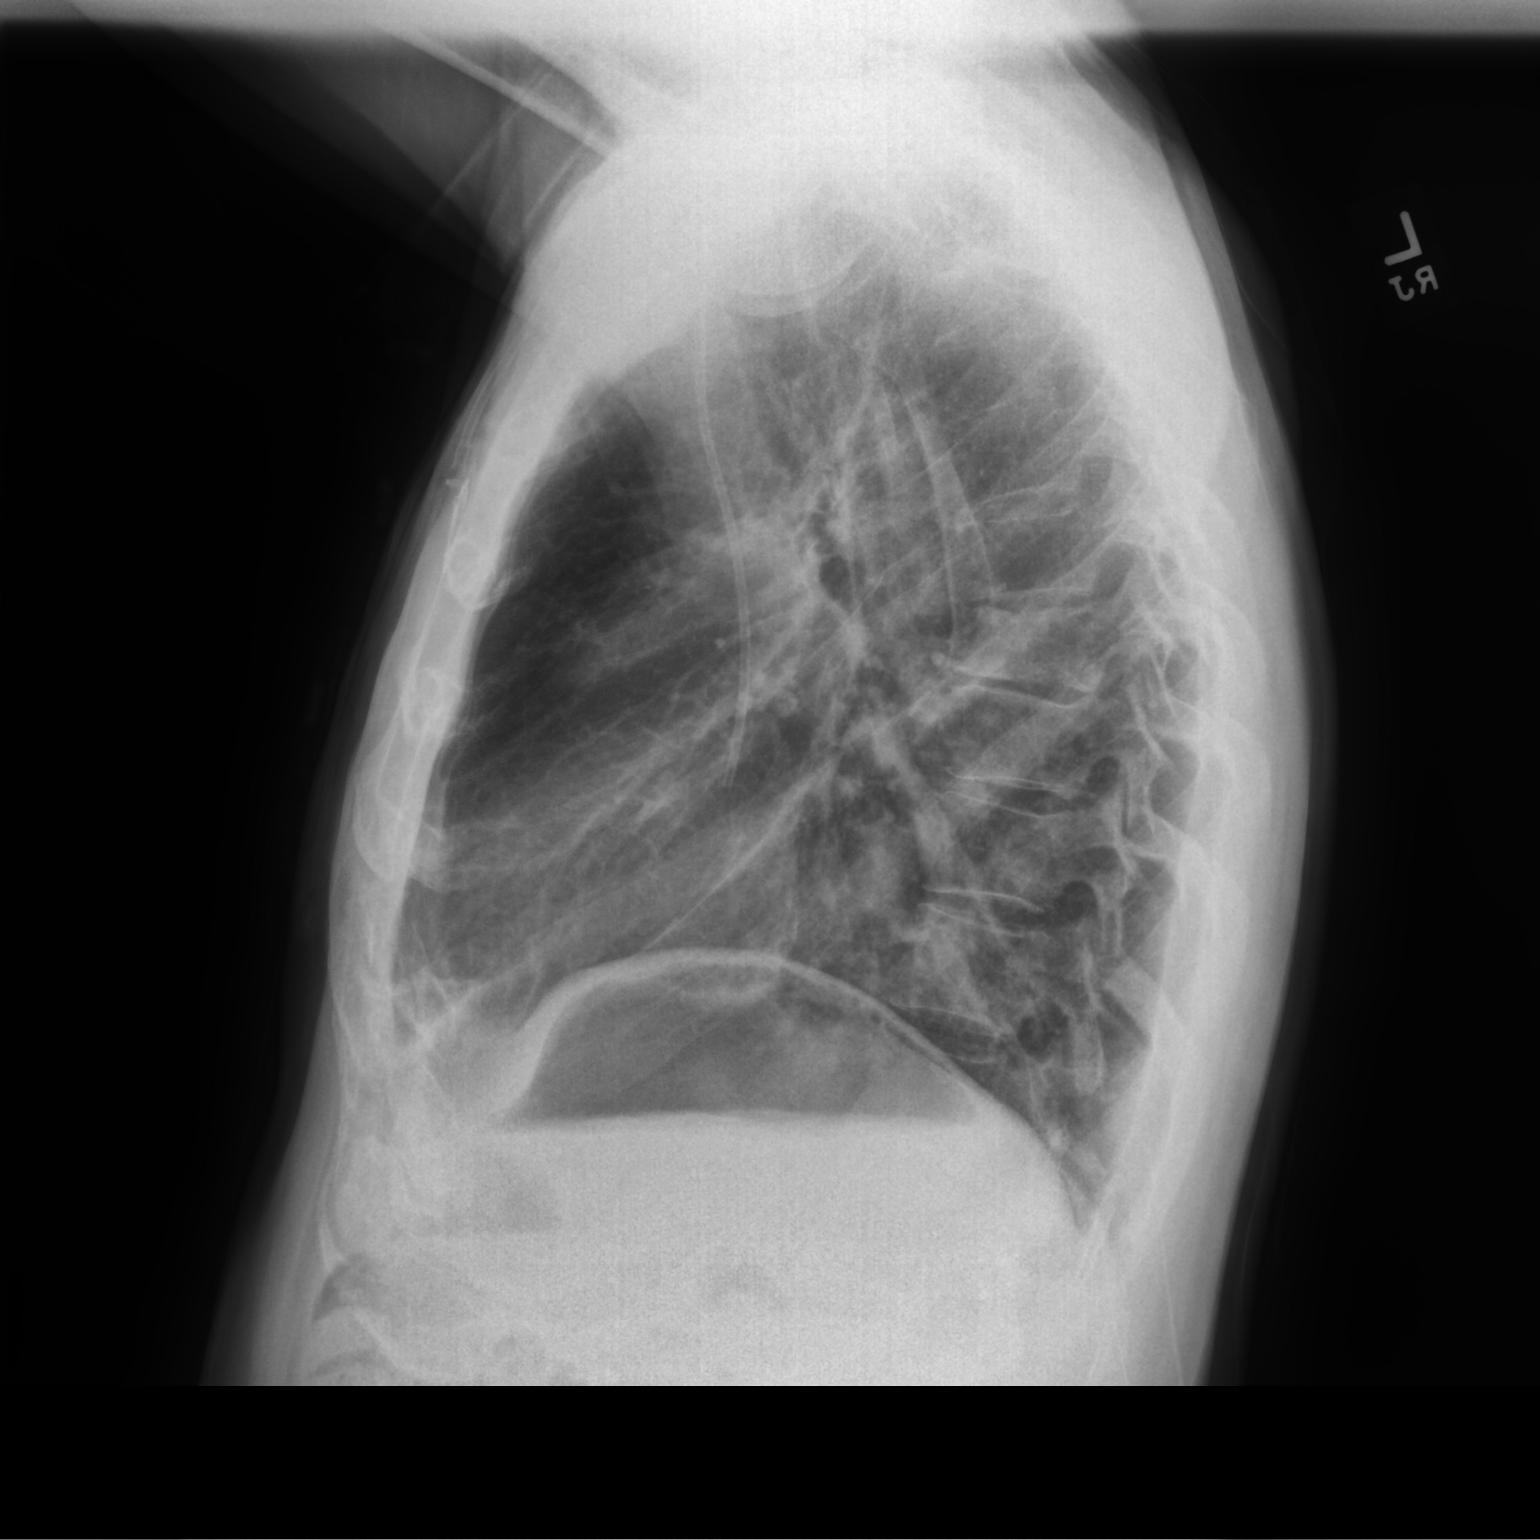

[2 of 2 positions shown; findings below may reference images not displayed]

FINDINGS: Continued medial right lower lobe airspace opacity at the right
base, unchanged since prior study. Heart is normal size. Left lung
clear. No visible effusions. Right Port-A-Cath is unchanged.
IMPRESSION: Continued right medial basilar masslike opacity, not significantly
changed.

## 2020-09-27 NOTE — Addendum Note (Signed)
Addended by: Gavin Potters R on: 09/27/2020 10:54 AM   Modules accepted: Orders

## 2020-09-27 NOTE — Progress Notes (Signed)
Subjective:    Patient ID: Albert Flowers, male    DOB: 1970/04/29, 50 y.o.   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  He had COVID-19 in 08/2020 that was treated with remdesivir and recovered.  He had a PET scan done on 09/15/2020 reviewed by me that showed a newly enlarged hypermetabolic right middle lobe nodule 12 mm, new hypermetabolic right hilar and medial lower lobe consolidation.  He subsequently was seen by Dr. Halford Chessman earlier this month when he was admitted for right pleuritic chest pain with radiation to his right shoulder, fever, right lower lobe perihilar mass and consolidation.  Treated with broad-spectrum antibiotics.  Presents today for follow-up from the hospitalization for pneumonia (?  [facility]), abnormal imaging. He reports that his breathing is a bit better, cough is less - non productive, no hemoptysis. Still poor energy. He is still working. He has completed his abx from discharge.   CXR day reviewed.  Shows some improvement and right medial basilar infiltrate.  The right middle lobe nodule is not seen.   Review of Systems As per HPI  Past Medical History:  Diagnosis Date   Diabetes mellitus without complication ([place])    Malignant neoplasm of base of tongue ([place]) 06/02/2018     No family history on file.   Social History   Socioeconomic History   Marital status: Married    Spouse name: Not on file   Number of children: 4   Years of education: Not on file   Highest education level: Not on file  Occupational History   Not on file  Tobacco Use   Smoking status: Never   Smokeless tobacco: Never  Vaping Use   Vaping Use: Never used  Substance and Sexual Activity   Alcohol use: Yes    Comment: occasional   Drug use: Never   Sexual activity: Not on file  Other Topics Concern   Not on file  Social History Narrative   Not  on file   Social Determinants of Health   Financial Resource Strain: Not on file  Food Insecurity: Not on file  Transportation Needs: Not on file  Physical Activity: Not on file  Stress: Not on file  Social Connections: Not on file  Intimate Partner Violence: Not on file     Allergies  Allergen Reactions   Penicillins Other (See Comments)    Unknown reaction     Outpatient Medications Prior to Visit  Medication Sig Dispense Refill   blood glucose meter kit and supplies KIT Dispense based on patient and insurance preference. Use up to four times daily as directed. 1 each 0   dexamethasone (DECADRON) 4 MG tablet Take 2 tablets (8 mg total) by mouth daily. Start the day after carboplatin chemotherapy for 3 days. 30 tablet 1   diphenhydrAMINE (BENADRYL) 25 MG tablet Take 1 tablet (25 mg total) by mouth at bedtime. 30 tablet    feeding supplement, GLUCERNA SHAKE, (GLUCERNA SHAKE) LIQD Take 237 mLs by mouth 3 (three) times daily between meals. 30 mL 0   gabapentin (NEURONTIN) 300 MG capsule Take 900 mg by mouth 3 (three) times daily.     guaiFENesin (ROBITUSSIN) 100 MG/5ML SOLN Take 5 mLs (100 mg total) by mouth every 4 (four) hours as needed for cough or to loosen phlegm. 236 mL 0   insulin aspart (NOVOLOG) 100 UNIT/ML  FlexPen Before each meal 3 times a day, 140-199 - 2 units, 200-250 - 4 units, 251-299 - 6 units,  300-349 - 8 units,  350 or above 10 units. Insulin PEN if approved, provide syringes and needles if needed. 15 mL 0   Insulin Pen Needle 29G X 5MM MISC For sliding scale insulin injection 100 each 0   lidocaine-prilocaine (EMLA) cream Apply to affected area once 30 g 3   magic mouthwash SOLN Take 15 mLs by mouth 3 (three) times daily.     megestrol (MEGACE ES) 625 MG/5ML suspension Take 5 mLs (625 mg total) by mouth daily. 150 mL 0   mirtazapine (REMERON) 15 MG tablet Take 2 tablets (30 mg total) by mouth at bedtime. 60 tablet 0   ondansetron (ZOFRAN ODT) 8 MG disintegrating  tablet Take 1 tablet (8 mg total) by mouth every 8 (eight) hours as needed for nausea or vomiting. 30 tablet 3   saccharomyces boulardii (FLORASTOR) 250 MG capsule Take 1 capsule (250 mg total) by mouth 2 (two) times daily for 7 days. 14 capsule 0   Facility-Administered Medications Prior to Visit  Medication Dose Route Frequency Provider Last Rate Last Admin   sodium chloride flush (NS) 0.9 % injection 10 mL  10 mL Intracatheter PRN Benay Pike, MD   10 mL at 07/22/20 1638         Objective:   Physical Exam  Vitals:   09/27/20 0937  BP: 108/70  Pulse: (!) 102  Temp: 97.9 F (36.6 C)  TempSrc: Oral  SpO2: 99%  Weight: 141 lb 9.6 oz (64.2 kg)  Height: _0  (1.778 m)   Gen: Pleasant, thin, in no distress,  normal affect  ENT: No lesions,  mouth clear but dry,  oropharynx clear, no postnasal drip  Neck: No JVD, no stridor  Lungs: No use of accessory muscles, no crackles or wheezing on normal respiration, no wheeze on forced expiration  Cardiovascular: RRR, heart sounds normal, no murmur or gallops, no peripheral edema  Musculoskeletal: No deformities, no cyanosis or clubbing  Neuro: alert, awake, non focal  Skin: Warm, no lesions or rash     Assessment & Plan:   Abnormal CT of the chest He was diagnosed with a right lower lobe pneumonia, treated with antibiotics earlier this month.  The imaging was consistent with a progressive right basilar infiltrate comparing his PET scan from 6/30 to CT chest from 7/2.  He is clinically improving with treatment.  Chest x-ray from today reviewed and shows some interval resolution of his right basilar infiltrate.  Unclear whether the isolated hypermetabolic right middle lobe nodule is related to this infectious process or not.  We will plan to repeat his CT chest, super D cuts, in mid August.  If the right middle lobe nodule persist then he may need robotic bronchoscopy and biopsy to determine whether this is metastatic disease  versus primary lung cancer.  I have to coordinate this care with Dr. Chryl Heck, determine whether she believes a biopsy is necessary.  It may be that targeted radiation therapy would be the plan regardless of whether biopsy is performed.  Community acquired pneumonia of right middle lobe of lung Clearly had an infiltrate in his medial right lower lobe, improving on chest x-ray with antibiotic treatment.  Unclear whether the right middle lobe nodule is related.  We will determine this with serial imaging as above.  Overall he is clinically improved.  Unclear when he will be cleared to restart  his chemotherapy postinfection and treatment.  He has follow-up with Dr. Chryl Heck in 1 week  Baltazar Apo, MD, PhD 09/27/2020, 10:16 AM [facility] [phone] or if no answer before 7:00PM call [phone] For any issues after 7:00PM please call eLink [phone]

## 2020-09-27 NOTE — Patient Instructions (Addendum)
We will perform a repeat CT scan of the chest in mid August to follow resolution of your right lower lobe pneumonia as well as the status of your small right middle lobe pulmonary nodule. Depending on the findings on CT scan we will determine whether any other testing is necessary to further evaluate the right middle lobe nodule. We will coordinate your care with Dr. Chryl Heck Follow with Dr Lamonte Sakai next available after your CT chest so that we can review together.

## 2020-09-27 NOTE — Assessment & Plan Note (Signed)
Clearly had an infiltrate in his medial right lower lobe, improving on chest x-ray with antibiotic treatment.  Unclear whether the right middle lobe nodule is related.  We will determine this with serial imaging as above.  Overall he is clinically improved.  Unclear when he will be cleared to restart his chemotherapy postinfection and treatment.  He has follow-up with Dr. Chryl Heck in 1 week

## 2020-09-27 NOTE — Assessment & Plan Note (Signed)
He was diagnosed with a right lower lobe pneumonia, treated with antibiotics earlier this month.  The imaging was consistent with a progressive right basilar infiltrate comparing his PET scan from 6/30 to CT chest from 7/2.  He is clinically improving with treatment.  Chest x-ray from today reviewed and shows some interval resolution of his right basilar infiltrate.  Unclear whether the isolated hypermetabolic right middle lobe nodule is related to this infectious process or not.  We will plan to repeat his CT chest, super D cuts, in mid August.  If the right middle lobe nodule persist then he may need robotic bronchoscopy and biopsy to determine whether this is metastatic disease versus primary lung cancer.  I have to coordinate this care with Dr. Chryl Heck, determine whether she believes a biopsy is necessary.  It may be that targeted radiation therapy would be the plan regardless of whether biopsy is performed.

## 2020-10-02 ENCOUNTER — Encounter (HOSPITAL_COMMUNITY): Payer: Self-pay | Admitting: Emergency Medicine

## 2020-10-02 ENCOUNTER — Other Ambulatory Visit: Payer: Self-pay

## 2020-10-02 ENCOUNTER — Inpatient Hospital Stay (HOSPITAL_COMMUNITY)
Admission: EM | Admit: 2020-10-02 | Discharge: 2020-10-07 | DRG: 193 | Disposition: A | Discharge status: Home Health | Payer: Managed Care, Other (non HMO) | Attending: Internal Medicine | Admitting: Internal Medicine

## 2020-10-02 ENCOUNTER — Emergency Department (HOSPITAL_COMMUNITY): Payer: Managed Care, Other (non HMO)

## 2020-10-02 ENCOUNTER — Telehealth: Payer: Self-pay | Admitting: Pulmonary Disease

## 2020-10-02 DIAGNOSIS — D61810 Antineoplastic chemotherapy induced pancytopenia: Secondary | ICD-10-CM | POA: Diagnosis present

## 2020-10-02 DIAGNOSIS — R911 Solitary pulmonary nodule: Secondary | ICD-10-CM | POA: Diagnosis present

## 2020-10-02 DIAGNOSIS — E119 Type 2 diabetes mellitus without complications: Secondary | ICD-10-CM | POA: Diagnosis present

## 2020-10-02 DIAGNOSIS — T451X5A Adverse effect of antineoplastic and immunosuppressive drugs, initial encounter: Secondary | ICD-10-CM | POA: Diagnosis present

## 2020-10-02 DIAGNOSIS — E871 Hypo-osmolality and hyponatremia: Secondary | ICD-10-CM | POA: Diagnosis present

## 2020-10-02 DIAGNOSIS — J189 Pneumonia, unspecified organism: Secondary | ICD-10-CM | POA: Diagnosis not present

## 2020-10-02 DIAGNOSIS — R6510 Systemic inflammatory response syndrome (SIRS) of non-infectious origin without acute organ dysfunction: Secondary | ICD-10-CM | POA: Diagnosis present

## 2020-10-02 DIAGNOSIS — Z20822 Contact with and (suspected) exposure to covid-19: Secondary | ICD-10-CM | POA: Diagnosis present

## 2020-10-02 DIAGNOSIS — Z79899 Other long term (current) drug therapy: Secondary | ICD-10-CM

## 2020-10-02 DIAGNOSIS — E43 Unspecified severe protein-calorie malnutrition: Secondary | ICD-10-CM | POA: Diagnosis present

## 2020-10-02 DIAGNOSIS — Z8616 Personal history of COVID-19: Secondary | ICD-10-CM | POA: Diagnosis present

## 2020-10-02 DIAGNOSIS — Z8701 Personal history of pneumonia (recurrent): Secondary | ICD-10-CM

## 2020-10-02 DIAGNOSIS — K1231 Oral mucositis (ulcerative) due to antineoplastic therapy: Secondary | ICD-10-CM | POA: Diagnosis present

## 2020-10-02 DIAGNOSIS — Y95 Nosocomial condition: Secondary | ICD-10-CM | POA: Diagnosis present

## 2020-10-02 DIAGNOSIS — Z880 Allergy status to penicillin: Secondary | ICD-10-CM

## 2020-10-02 DIAGNOSIS — E46 Unspecified protein-calorie malnutrition: Secondary | ICD-10-CM | POA: Diagnosis present

## 2020-10-02 DIAGNOSIS — C7951 Secondary malignant neoplasm of bone: Secondary | ICD-10-CM | POA: Diagnosis present

## 2020-10-02 DIAGNOSIS — J9620 Acute and chronic respiratory failure, unspecified whether with hypoxia or hypercapnia: Secondary | ICD-10-CM | POA: Diagnosis present

## 2020-10-02 DIAGNOSIS — Z794 Long term (current) use of insulin: Secondary | ICD-10-CM

## 2020-10-02 DIAGNOSIS — Z923 Personal history of irradiation: Secondary | ICD-10-CM

## 2020-10-02 DIAGNOSIS — Z6821 Body mass index (BMI) 21.0-21.9, adult: Secondary | ICD-10-CM

## 2020-10-02 DIAGNOSIS — C787 Secondary malignant neoplasm of liver and intrahepatic bile duct: Secondary | ICD-10-CM | POA: Diagnosis present

## 2020-10-02 DIAGNOSIS — C01 Malignant neoplasm of base of tongue: Secondary | ICD-10-CM | POA: Diagnosis present

## 2020-10-02 DIAGNOSIS — D61818 Other pancytopenia: Secondary | ICD-10-CM | POA: Diagnosis present

## 2020-10-02 LAB — BASIC METABOLIC PANEL
Anion gap: 10 (ref 5–15)
BUN: 15 mg/dL (ref 6–20)
CO2: 24 mmol/L (ref 22–32)
Calcium: 8.7 mg/dL — ABNORMAL LOW (ref 8.9–10.3)
Chloride: 96 mmol/L — ABNORMAL LOW (ref 98–111)
Creatinine, Ser: 0.55 mg/dL — ABNORMAL LOW (ref 0.61–1.24)
GFR, Estimated: >60 mL/min (ref >60)
Glucose, Bld: 302 mg/dL — ABNORMAL HIGH (ref 70–99)
Potassium: 3.9 mmol/L (ref 3.5–5.1)
Sodium: 130 mmol/L — ABNORMAL LOW (ref 135–145)

## 2020-10-02 LAB — CBC
HCT: 29.1 % — ABNORMAL LOW (ref 39.0–52.0)
Hemoglobin: 9.4 g/dL — ABNORMAL LOW (ref 13.0–17.0)
MCH: 31.8 pg (ref 26.0–34.0)
MCHC: 32.3 g/dL (ref 30.0–36.0)
MCV: 98.3 fL (ref 80.0–100.0)
Platelets: 125 10*3/uL — ABNORMAL LOW (ref 150–400)
RBC: 2.96 MIL/uL — ABNORMAL LOW (ref 4.22–5.81)
RDW: 17.7 % — ABNORMAL HIGH (ref 11.5–15.5)
WBC: 3.9 10*3/uL — ABNORMAL LOW (ref 4.0–10.5)
nRBC: 0 % (ref 0.0–0.2)

## 2020-10-02 LAB — TROPONIN I (HIGH SENSITIVITY): Troponin I (High Sensitivity): 4 ng/L (ref <18)

## 2020-10-02 IMAGING — DX DG CHEST 2V
2 series · 2 of 2 positions shown · non-contrast
Comparison: [DATE]

CLINICAL DATA: Chest pain

EXAM:
CHEST - 2 VIEW

[chest pa]
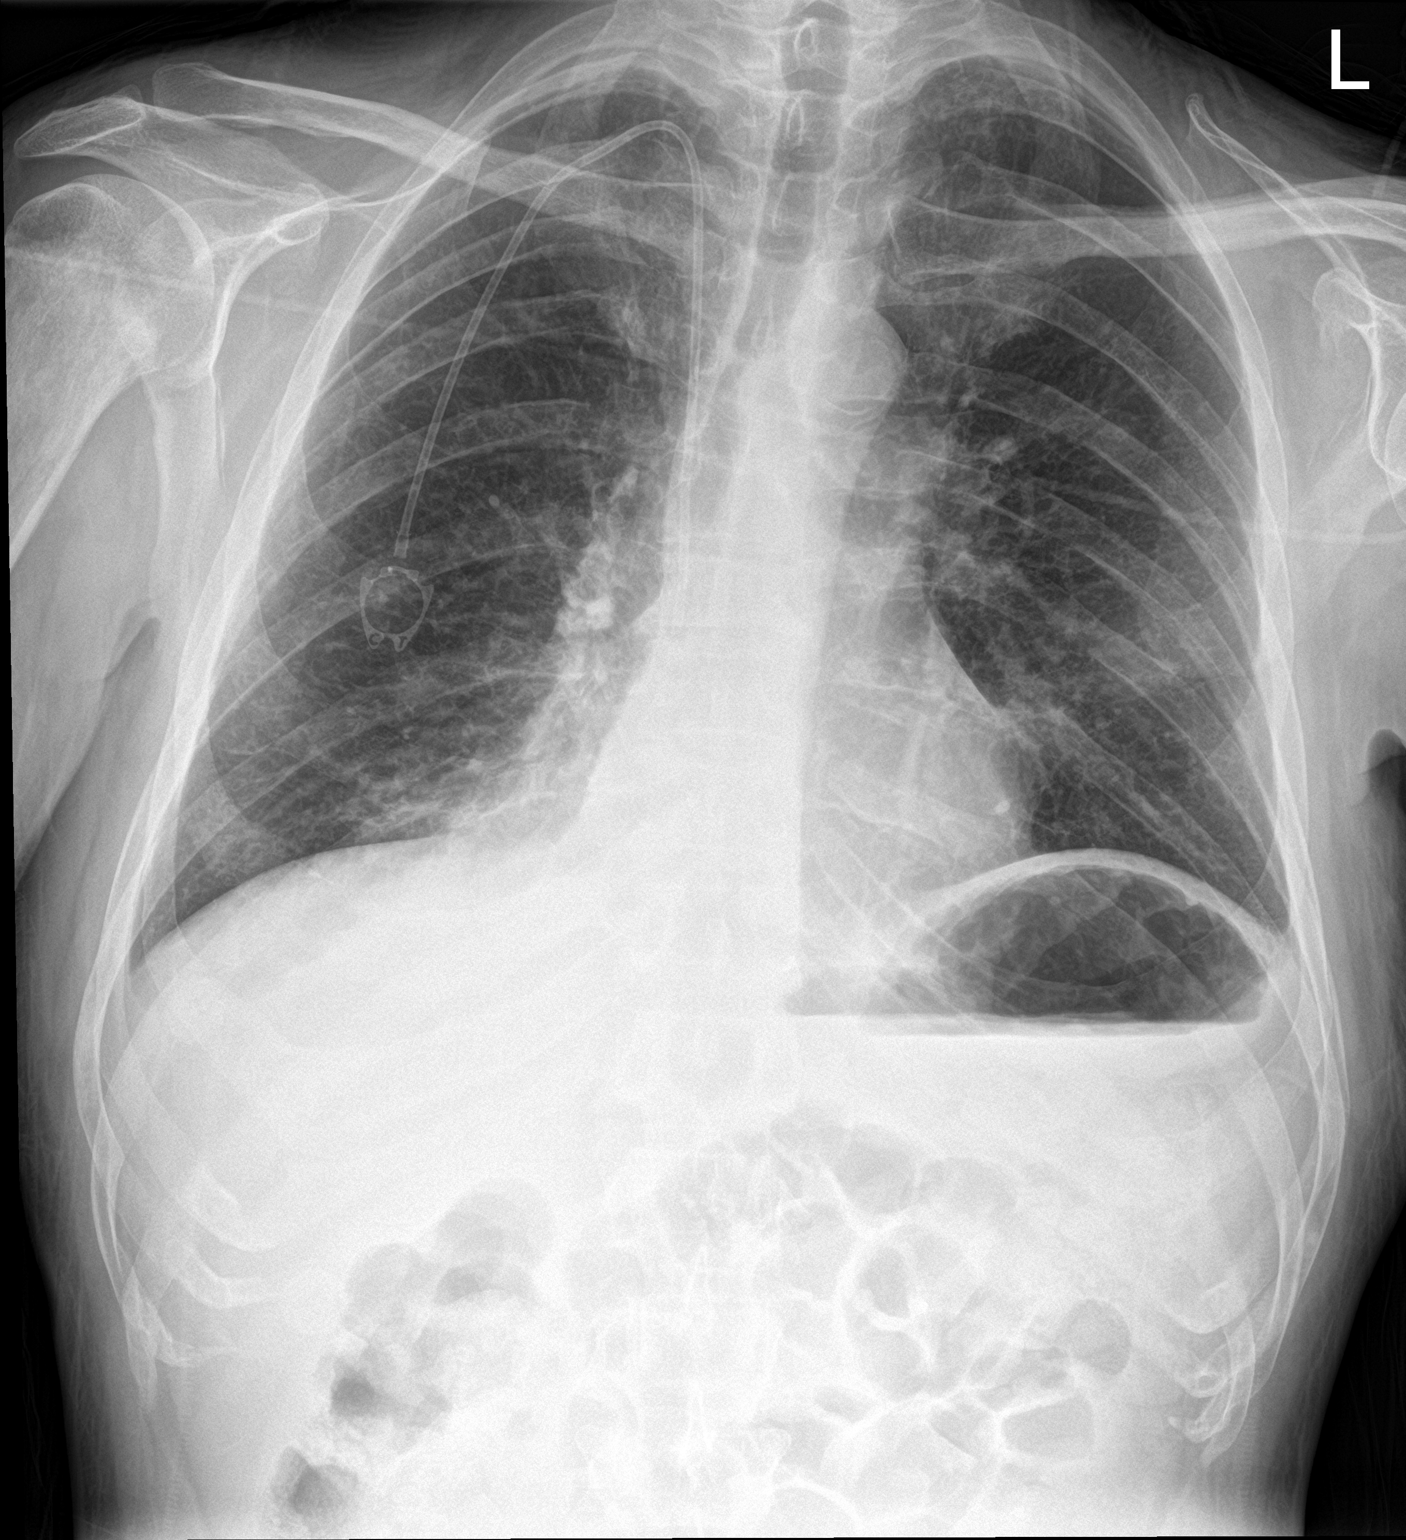

[chest lat]
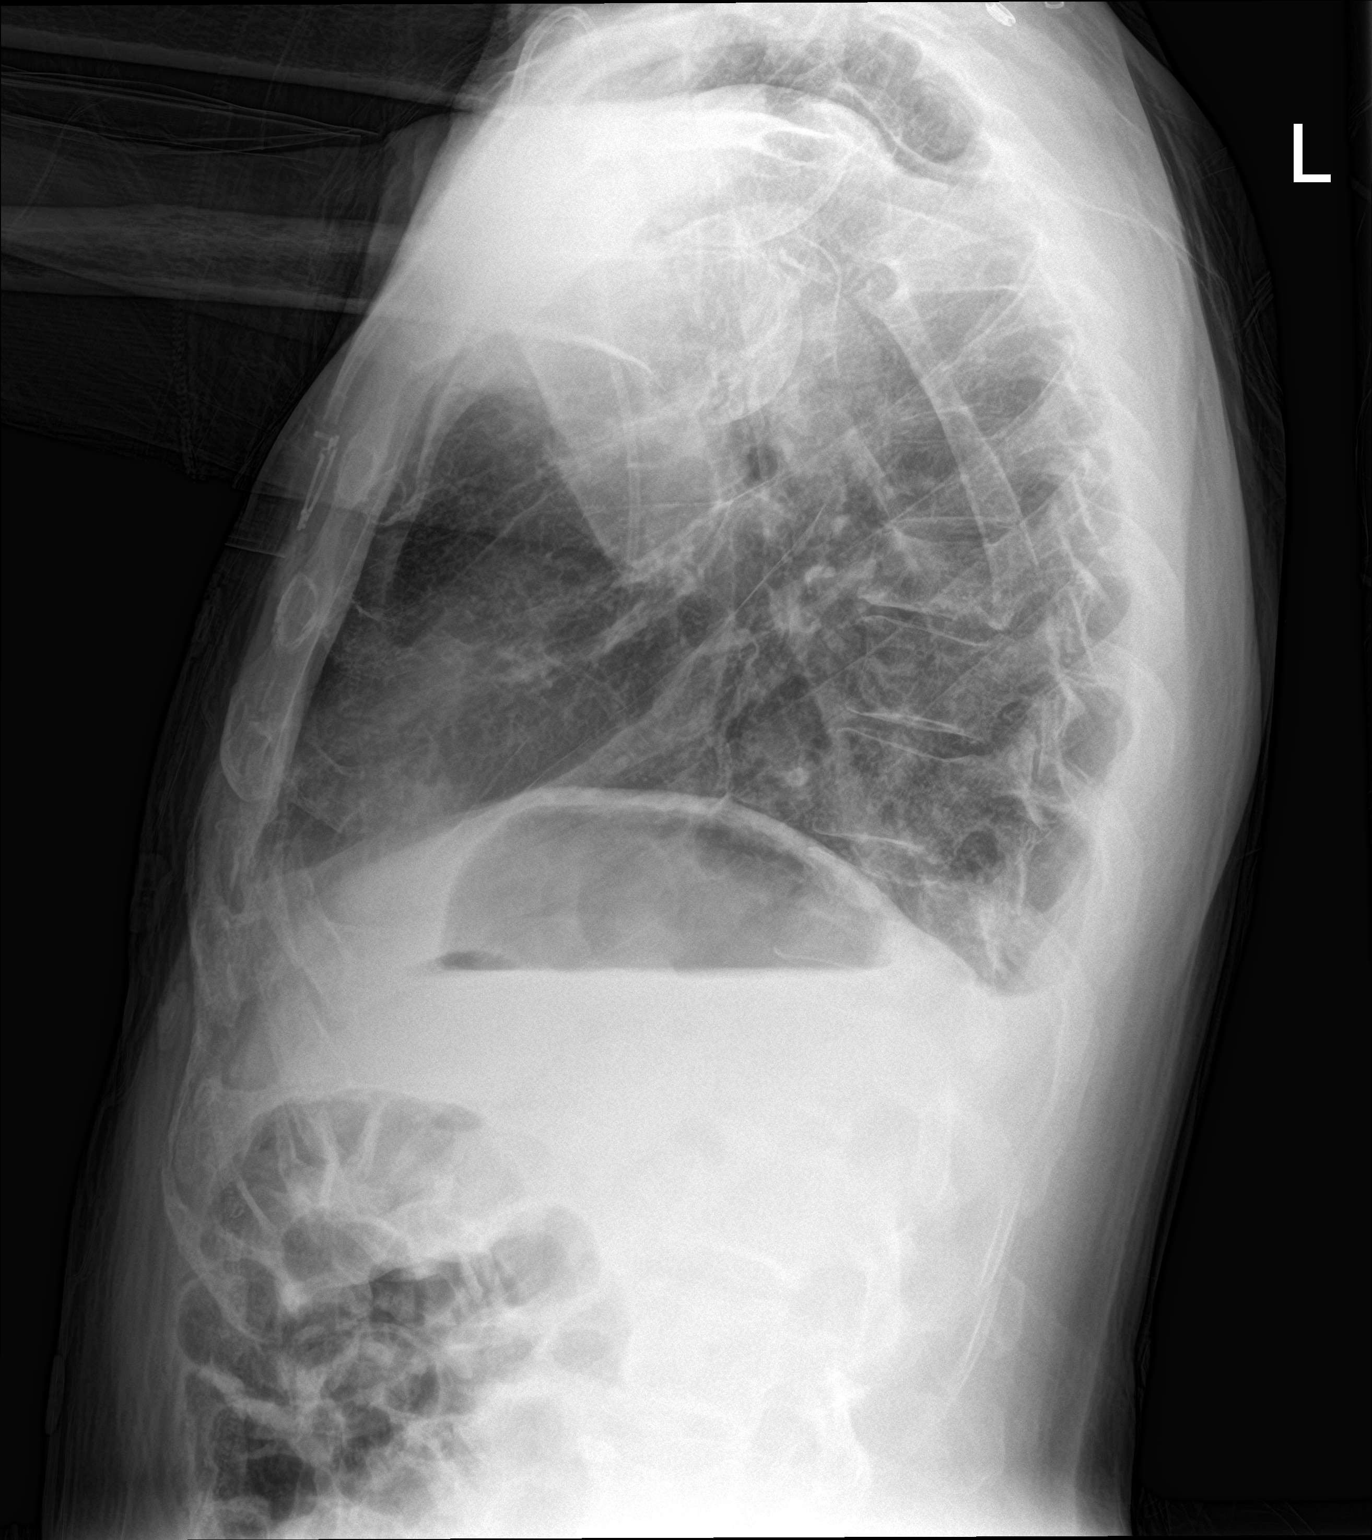

[2 of 2 positions shown; findings below may reference images not displayed]

FINDINGS: The lungs are symmetrically well expanded. Small bilateral pleural
effusions are present, new since prior examination. Medial right
basilar consolidation is unchanged. No pneumothorax. Right internal
jugular chest port is seen with its tip at the superior cavoatrial
junction. Cardiac size within normal limits. Pulmonary vascularity
is normal. No acute bone abnormality.
IMPRESSION: Interval development of small bilateral pleural effusions.

Stable right medial basilar consolidation.

## 2020-10-02 MED ORDER — LACTATED RINGERS IV BOLUS
1000.0000 mL | Freq: Once | INTRAVENOUS | Status: AC
  Administered 2020-10-03: 1000 mL via INTRAVENOUS

## 2020-10-02 MED ORDER — FENTANYL CITRATE (PF) 100 MCG/2ML IJ SOLN
50.0000 ug | INTRAMUSCULAR | Status: DC | PRN
  Filled 2020-10-02: qty 2

## 2020-10-02 NOTE — ED Triage Notes (Addendum)
Patient reports left chest pain this week  , denies SOB , no emesis or diaphoresis , pain increases with deep inspiration . No cough or fever .

## 2020-10-02 NOTE — Telephone Encounter (Signed)
Called by Albert Flowers wife that Albert Flowers was having lethargy, increased SOB, pleuritic chest pain, pale color and hypotension. Briefly 50 yo , male with PMH of DM, Ca of base of tongue  and recently D/C from hospital for pneumonia. Advised wife that this was not an issue that could be managed at home and asked her to bring her husband to the emergency department for further evaluation and likely hospital admission.

## 2020-10-03 ENCOUNTER — Emergency Department (HOSPITAL_COMMUNITY): Payer: Managed Care, Other (non HMO)

## 2020-10-03 DIAGNOSIS — R911 Solitary pulmonary nodule: Secondary | ICD-10-CM | POA: Diagnosis present

## 2020-10-03 DIAGNOSIS — Z79899 Other long term (current) drug therapy: Secondary | ICD-10-CM | POA: Diagnosis not present

## 2020-10-03 DIAGNOSIS — E119 Type 2 diabetes mellitus without complications: Secondary | ICD-10-CM

## 2020-10-03 DIAGNOSIS — J189 Pneumonia, unspecified organism: Secondary | ICD-10-CM | POA: Diagnosis present

## 2020-10-03 DIAGNOSIS — Z923 Personal history of irradiation: Secondary | ICD-10-CM | POA: Diagnosis not present

## 2020-10-03 DIAGNOSIS — T451X5A Adverse effect of antineoplastic and immunosuppressive drugs, initial encounter: Secondary | ICD-10-CM | POA: Diagnosis present

## 2020-10-03 DIAGNOSIS — C7951 Secondary malignant neoplasm of bone: Secondary | ICD-10-CM | POA: Diagnosis present

## 2020-10-03 DIAGNOSIS — E43 Unspecified severe protein-calorie malnutrition: Secondary | ICD-10-CM

## 2020-10-03 DIAGNOSIS — C787 Secondary malignant neoplasm of liver and intrahepatic bile duct: Secondary | ICD-10-CM | POA: Diagnosis present

## 2020-10-03 DIAGNOSIS — Z20822 Contact with and (suspected) exposure to covid-19: Secondary | ICD-10-CM | POA: Diagnosis present

## 2020-10-03 DIAGNOSIS — C01 Malignant neoplasm of base of tongue: Secondary | ICD-10-CM | POA: Diagnosis present

## 2020-10-03 DIAGNOSIS — Z8616 Personal history of COVID-19: Secondary | ICD-10-CM | POA: Diagnosis present

## 2020-10-03 DIAGNOSIS — Z6821 Body mass index (BMI) 21.0-21.9, adult: Secondary | ICD-10-CM | POA: Diagnosis not present

## 2020-10-03 DIAGNOSIS — E871 Hypo-osmolality and hyponatremia: Secondary | ICD-10-CM | POA: Diagnosis present

## 2020-10-03 DIAGNOSIS — Z880 Allergy status to penicillin: Secondary | ICD-10-CM | POA: Diagnosis not present

## 2020-10-03 DIAGNOSIS — K1231 Oral mucositis (ulcerative) due to antineoplastic therapy: Secondary | ICD-10-CM | POA: Diagnosis present

## 2020-10-03 DIAGNOSIS — J9620 Acute and chronic respiratory failure, unspecified whether with hypoxia or hypercapnia: Secondary | ICD-10-CM | POA: Diagnosis present

## 2020-10-03 DIAGNOSIS — Z8701 Personal history of pneumonia (recurrent): Secondary | ICD-10-CM | POA: Diagnosis not present

## 2020-10-03 DIAGNOSIS — D61818 Other pancytopenia: Secondary | ICD-10-CM

## 2020-10-03 DIAGNOSIS — R6510 Systemic inflammatory response syndrome (SIRS) of non-infectious origin without acute organ dysfunction: Secondary | ICD-10-CM

## 2020-10-03 DIAGNOSIS — D61810 Antineoplastic chemotherapy induced pancytopenia: Secondary | ICD-10-CM | POA: Diagnosis present

## 2020-10-03 DIAGNOSIS — Y95 Nosocomial condition: Secondary | ICD-10-CM | POA: Diagnosis present

## 2020-10-03 DIAGNOSIS — Z794 Long term (current) use of insulin: Secondary | ICD-10-CM | POA: Diagnosis not present

## 2020-10-03 LAB — CBC WITH DIFFERENTIAL/PLATELET
Abs Immature Granulocytes: 0.06 10*3/uL (ref 0.00–0.07)
Basophils Absolute: 0 10*3/uL (ref 0.0–0.1)
Basophils Relative: 0 %
Eosinophils Absolute: 0 10*3/uL (ref 0.0–0.5)
Eosinophils Relative: 1 %
HCT: 26.1 % — ABNORMAL LOW (ref 39.0–52.0)
Hemoglobin: 8.7 g/dL — ABNORMAL LOW (ref 13.0–17.0)
Immature Granulocytes: 2 %
Lymphocytes Relative: 13 %
Lymphs Abs: 0.4 10*3/uL — ABNORMAL LOW (ref 0.7–4.0)
MCH: 32.5 pg (ref 26.0–34.0)
MCHC: 33.3 g/dL (ref 30.0–36.0)
MCV: 97.4 fL (ref 80.0–100.0)
Monocytes Absolute: 0.4 10*3/uL (ref 0.1–1.0)
Monocytes Relative: 12 %
Neutro Abs: 2 10*3/uL (ref 1.7–7.7)
Neutrophils Relative %: 72 %
Platelets: 128 10*3/uL — ABNORMAL LOW (ref 150–400)
RBC: 2.68 MIL/uL — ABNORMAL LOW (ref 4.22–5.81)
RDW: 18.1 % — ABNORMAL HIGH (ref 11.5–15.5)
WBC: 2.9 10*3/uL — ABNORMAL LOW (ref 4.0–10.5)
nRBC: 0 % (ref 0.0–0.2)

## 2020-10-03 LAB — COMPREHENSIVE METABOLIC PANEL
ALT: 21 U/L (ref 0–44)
AST: 21 U/L (ref 15–41)
Albumin: 2.4 g/dL — ABNORMAL LOW (ref 3.5–5.0)
Alkaline Phosphatase: 110 U/L (ref 38–126)
Anion gap: 7 (ref 5–15)
BUN: 11 mg/dL (ref 6–20)
CO2: 24 mmol/L (ref 22–32)
Calcium: 8.8 mg/dL — ABNORMAL LOW (ref 8.9–10.3)
Chloride: 102 mmol/L (ref 98–111)
Creatinine, Ser: 0.45 mg/dL — ABNORMAL LOW (ref 0.61–1.24)
GFR, Estimated: >60 mL/min (ref >60)
Glucose, Bld: 114 mg/dL — ABNORMAL HIGH (ref 70–99)
Potassium: 4.2 mmol/L (ref 3.5–5.1)
Sodium: 133 mmol/L — ABNORMAL LOW (ref 135–145)
Total Bilirubin: 0.3 mg/dL (ref 0.3–1.2)
Total Protein: 6.1 g/dL — ABNORMAL LOW (ref 6.5–8.1)

## 2020-10-03 LAB — GLUCOSE, CAPILLARY
Glucose-Capillary: 97 mg/dL (ref 70–99)
Glucose-Capillary: 206 mg/dL — ABNORMAL HIGH (ref 70–99)
Glucose-Capillary: 238 mg/dL — ABNORMAL HIGH (ref 70–99)

## 2020-10-03 LAB — SARS CORONAVIRUS 2 (TAT 6-24 HRS): SARS Coronavirus 2: NEGATIVE

## 2020-10-03 LAB — LACTIC ACID, PLASMA: Lactic Acid, Venous: 0.7 mmol/L (ref 0.5–1.9)

## 2020-10-03 LAB — PROCALCITONIN: Procalcitonin: 0.24 ng/mL

## 2020-10-03 LAB — BRAIN NATRIURETIC PEPTIDE: B Natriuretic Peptide: 15.2 pg/mL (ref 0.0–100.0)

## 2020-10-03 LAB — TROPONIN I (HIGH SENSITIVITY): Troponin I (High Sensitivity): 4 ng/L (ref <18)

## 2020-10-03 IMAGING — CT CT ANGIO CHEST
2 of 6 series · 17 of 36 positions shown · IV contrast (omnipaque)
Comparison: Chest radiographs dated [DATE]. CTA chest dated
[DATE].

CLINICAL DATA: Left chest pain

EXAM:
CT ANGIOGRAPHY CHEST WITH CONTRAST
TECHNIQUE: Multidetector CT imaging of the chest was performed using the
standard protocol during bolus administration of intravenous
contrast. Multiplanar CT image reconstructions and MIPs were
obtained to evaluate the vascular anatomy.
CONTRAST:  50mL OMNIPAQUE IOHEXOL 350 MG/ML SOLN

[Series 7: pe thins · axial · 0.74mm/px · z∈[-247,-20]mm · 16 of 361 slices shown]
[im 19/361  lung]
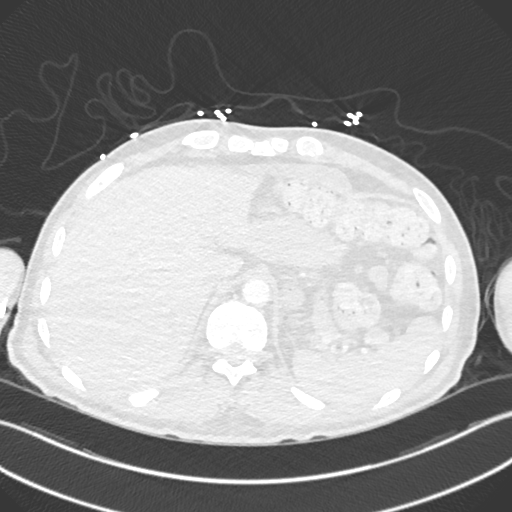
[im 37/361  mediastinal]
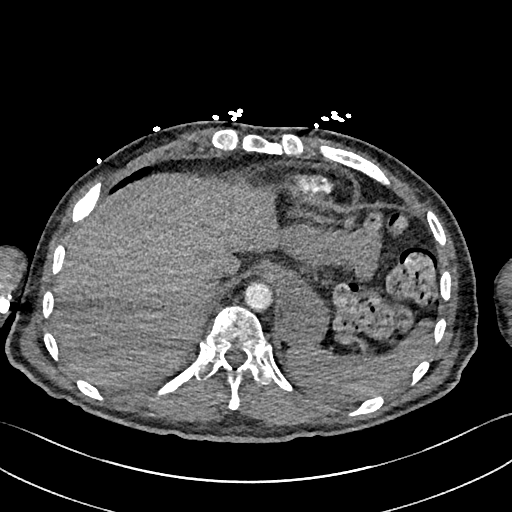
[im 55/361  lung]
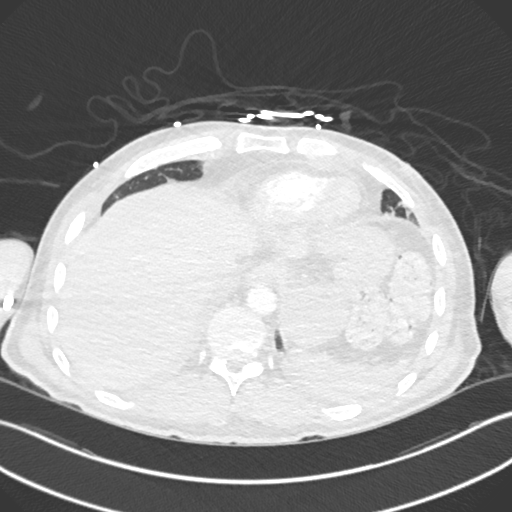
[im 91/361  mediastinal]
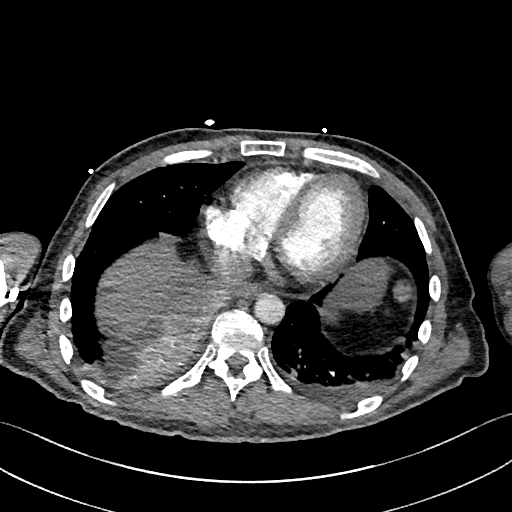
[im 109/361  lung]
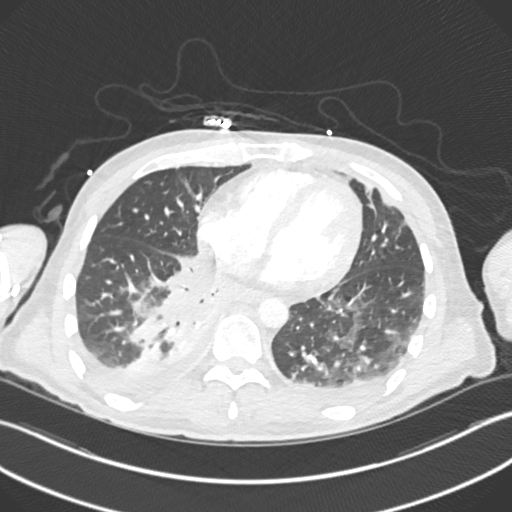
[im 127/361  mediastinal]
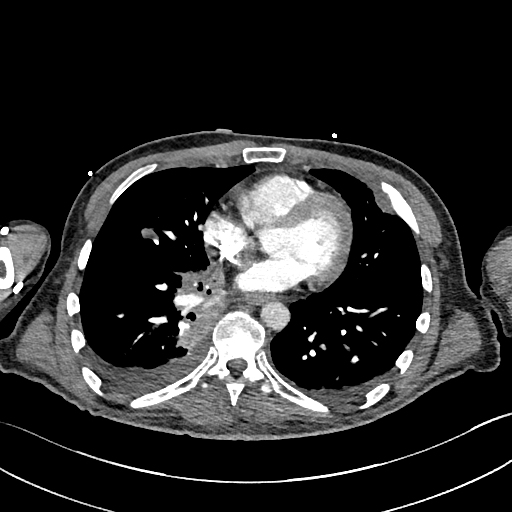
[im 145/361  lung]
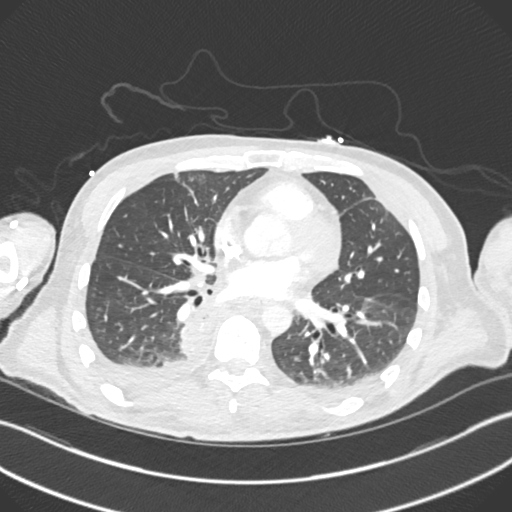
[im 163/361  mediastinal]
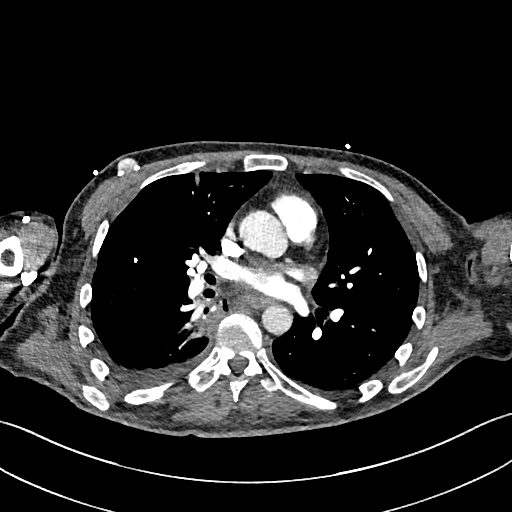
[im 199/361  lung]
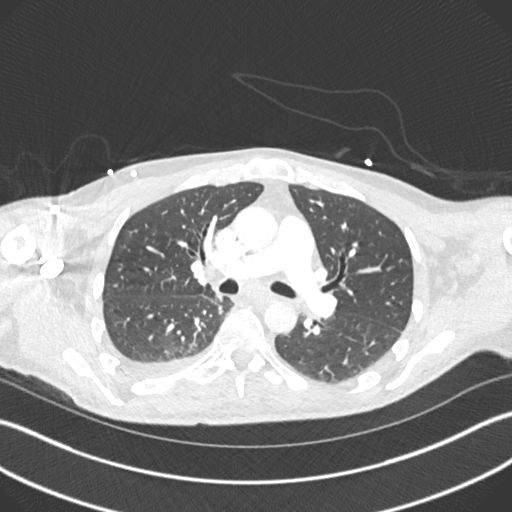
[im 217/361  mediastinal]
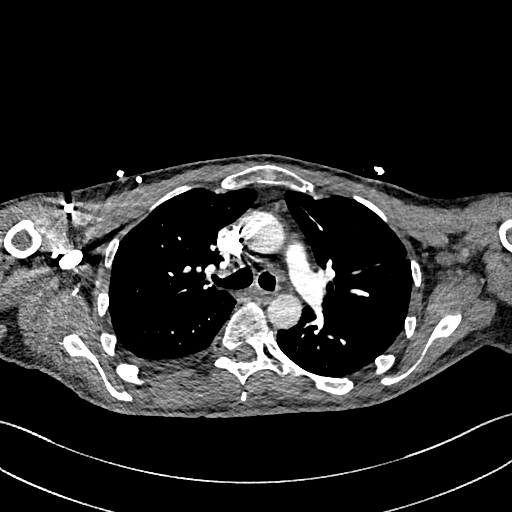
[im 235/361  lung]
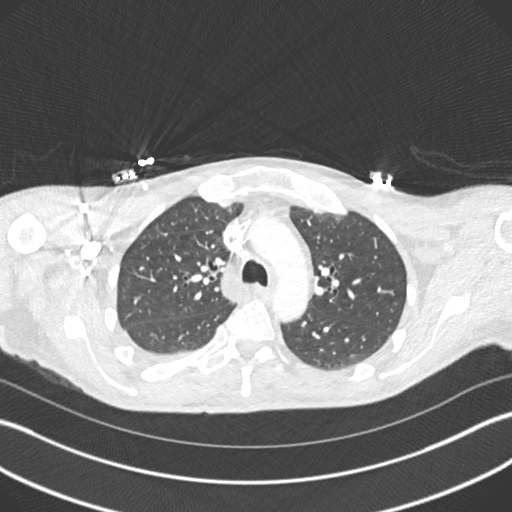
[im 253/361  mediastinal]
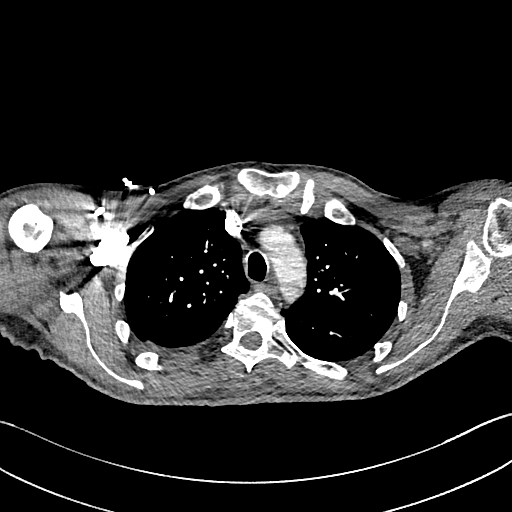
[im 271/361  lung]
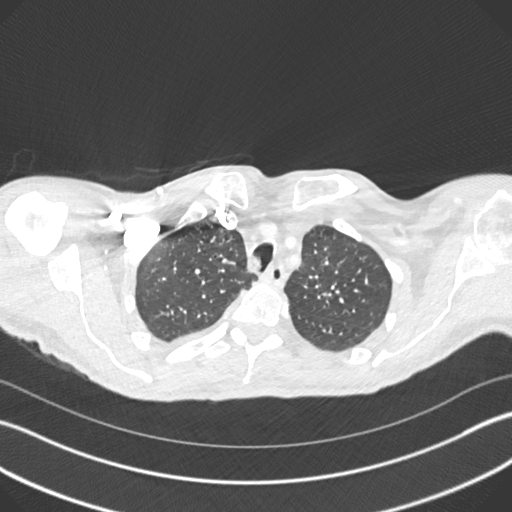
[im 307/361  mediastinal]
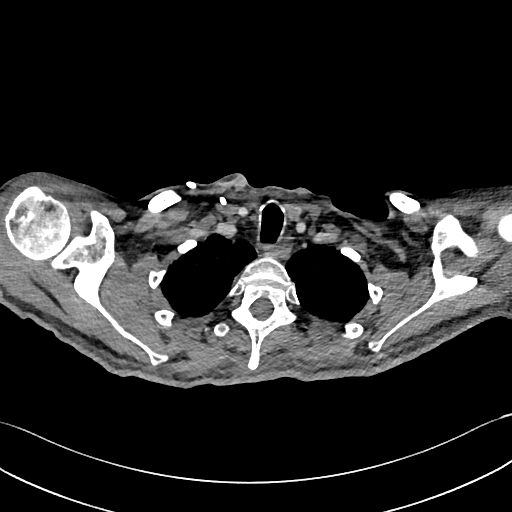
[im 325/361  lung]
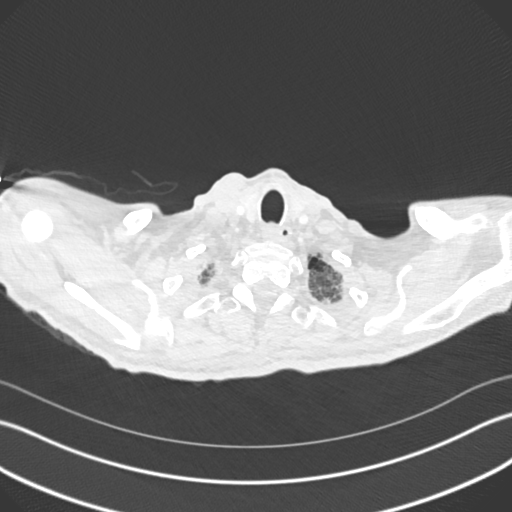
[im 343/361  mediastinal]
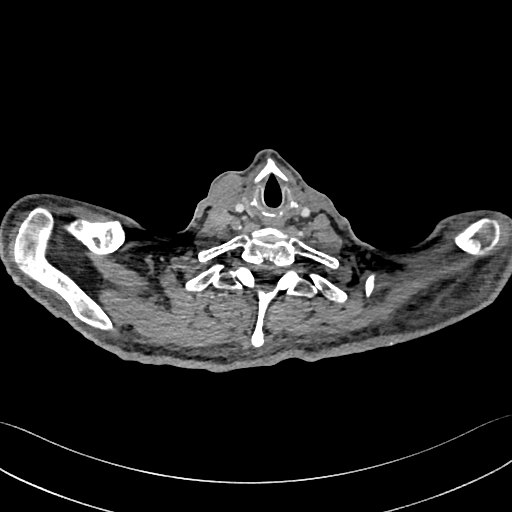

[Series 8: pe 2mm cor · coronal · 0.53mm/px · 1 of 125 slices shown]
[im 63/125  mediastinal]
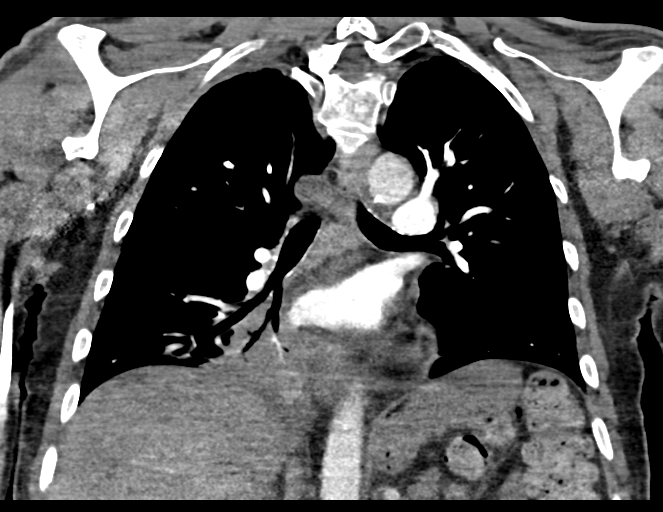

[17 of 36 positions shown; findings below may reference images not displayed]

FINDINGS: Cardiovascular: Satisfactory opacification of the bilateral
pulmonary arteries to the lobar level. No evidence of pulmonary
embolism.

Although not tailored for evaluation of the thoracic aorta, there is
no evidence of thoracic aortic aneurysm or dissection. Mild
atherosclerotic calcifications of the aortic arch.

Heart is normal in size.  No pericardial effusion.

Mild coronary atherosclerosis of the LAD.

Mediastinum/Nodes: No suspicious mediastinal lymphadenopathy.

Visualized thyroid is unremarkable.

Lungs/Pleura: Scarring/radiation changes at the lung apices.

Patchy opacity in the medial right lower lobe (series 5/image 93),
less rounded/masslike on the current study, continuing to favor
infection/pneumonia. Associated small bilateral pleural effusions,
unchanged on the right and new on the left.

Associated mild compressive atelectasis in the left lower lobe.

14 mm right middle lobe nodule (series 6/image 82), previously 12
mm. 6 mm posterior right lower lobe nodule (series 6/image 54),
previously 4 mm. These are suspicious for metastatic disease when
correlating with prior PET.

Mild patchy/ground-glass opacities in the right upper lobe (series
6/image 68), suspicious for additional infection/pneumonia, new.
Mild vague ground-glass opacity in the left lower lobe (series
6/image 88), equivocal.

Debris in the trachea (series 6/image 31), likely reflecting
secretions.

No pneumothorax.

Upper Abdomen: Visualized upper abdomen is grossly unremarkable.

Musculoskeletal: Multifocal osseous metastases involving the left
sternum, multiple ribs, multiple thoracic vertebral bodies, and the
right proximal humerus, grossly unchanged.

However, the soft tissue component along the left anterior 4th rib
is more conspicuous on the current study, suggesting mild
progression (series 5/image 84).

Review of the MIP images confirms the above findings.
IMPRESSION: No evidence of pulmonary embolism.

Patchy right lower lobe opacity remains suspicious for pneumonia.
Additional mild right upper lobe infection/pneumonia. Small
bilateral pleural effusions, progressive on the left.

Two right lung nodules are mildly progressive from recent CT,
suspicious for metastatic disease.

Mildly progressive soft tissue component associated with left
anterior 4th rib metastasis. Additional multifocal osseous
metastases are unchanged.

Aortic Atherosclerosis ([41]-[41]).

## 2020-10-03 MED ORDER — SODIUM CHLORIDE 0.9 % IV SOLN
2.0000 g | Freq: Once | INTRAVENOUS | Status: AC
  Administered 2020-10-03: 2 g via INTRAVENOUS
  Filled 2020-10-03: qty 2

## 2020-10-03 MED ORDER — BOOST PO LIQD
237.0000 mL | Freq: Three times a day (TID) | ORAL | Status: DC
  Administered 2020-10-03 – 2020-10-04 (×4): 237 mL via ORAL
  Filled 2020-10-03 (×7): qty 237

## 2020-10-03 MED ORDER — MIRTAZAPINE 15 MG PO TABS
30.0000 mg | ORAL_TABLET | Freq: Every day | ORAL | Status: DC
  Administered 2020-10-03 – 2020-10-06 (×4): 30 mg via ORAL
  Filled 2020-10-03 (×5): qty 2

## 2020-10-03 MED ORDER — VANCOMYCIN HCL 1250 MG/250ML IV SOLN
1250.0000 mg | Freq: Once | INTRAVENOUS | Status: AC
  Administered 2020-10-03: 1250 mg via INTRAVENOUS
  Filled 2020-10-03: qty 250

## 2020-10-03 MED ORDER — GUAIFENESIN ER 600 MG PO TB12
600.0000 mg | ORAL_TABLET | Freq: Two times a day (BID) | ORAL | Status: DC
  Administered 2020-10-03 – 2020-10-07 (×9): 600 mg via ORAL
  Filled 2020-10-03 (×9): qty 1

## 2020-10-03 MED ORDER — ENSURE ENLIVE PO LIQD
237.0000 mL | Freq: Two times a day (BID) | ORAL | Status: DC
  Administered 2020-10-04 (×2): 237 mL via ORAL

## 2020-10-03 MED ORDER — INSULIN ASPART 100 UNIT/ML IJ SOLN: 0.0000 [IU] | Freq: Four times a day (QID) | INTRAMUSCULAR | Status: DC

## 2020-10-03 MED ORDER — LIDOCAINE-PRILOCAINE 2.5-2.5 % EX CREA: 1.0000 "application " | TOPICAL_CREAM | CUTANEOUS | Status: DC | PRN

## 2020-10-03 MED ORDER — INSULIN ASPART 100 UNIT/ML IJ SOLN: 0.0000 [IU] | Freq: Three times a day (TID) | INTRAMUSCULAR | Status: DC

## 2020-10-03 MED ORDER — AZITHROMYCIN 500 MG IV SOLR
500.0000 mg | INTRAVENOUS | Status: DC
  Administered 2020-10-03 – 2020-10-06 (×4): 500 mg via INTRAVENOUS
  Filled 2020-10-03 (×5): qty 500

## 2020-10-03 MED ORDER — IOHEXOL 350 MG/ML SOLN
50.0000 mL | Freq: Once | INTRAVENOUS | Status: AC | PRN
  Administered 2020-10-03: 50 mL via INTRAVENOUS

## 2020-10-03 MED ORDER — INSULIN ASPART 100 UNIT/ML IJ SOLN: 0.0000 [IU] | Freq: Every day | INTRAMUSCULAR | Status: DC

## 2020-10-03 MED ORDER — MAGIC MOUTHWASH
15.0000 mL | Freq: Three times a day (TID) | ORAL | Status: DC
  Administered 2020-10-03 – 2020-10-07 (×13): 15 mL via ORAL
  Filled 2020-10-03 (×14): qty 15

## 2020-10-03 MED ORDER — POLYETHYLENE GLYCOL 3350 17 G PO PACK
17.0000 g | PACK | Freq: Every day | ORAL | Status: DC
  Administered 2020-10-03 – 2020-10-07 (×5): 17 g via ORAL
  Filled 2020-10-03 (×5): qty 1

## 2020-10-03 MED ORDER — INSULIN ASPART 100 UNIT/ML IJ SOLN
0.0000 [IU] | Freq: Three times a day (TID) | INTRAMUSCULAR | Status: DC
  Administered 2020-10-03 – 2020-10-04 (×2): 3 [IU] via SUBCUTANEOUS
  Administered 2020-10-04: 5 [IU] via SUBCUTANEOUS
  Administered 2020-10-04: 3 [IU] via SUBCUTANEOUS

## 2020-10-03 MED ORDER — MEGESTROL ACETATE 400 MG/10ML PO SUSP
625.0000 mg | Freq: Every day | ORAL | Status: DC
  Filled 2020-10-03: qty 20

## 2020-10-03 MED ORDER — GABAPENTIN 300 MG PO CAPS
900.0000 mg | ORAL_CAPSULE | Freq: Three times a day (TID) | ORAL | Status: DC
  Administered 2020-10-03 – 2020-10-06 (×12): 900 mg via ORAL
  Filled 2020-10-03 (×13): qty 3

## 2020-10-03 MED ORDER — MEGESTROL ACETATE 400 MG/10ML PO SUSP
800.0000 mg | Freq: Every day | ORAL | Status: DC
  Administered 2020-10-03 – 2020-10-07 (×5): 800 mg via ORAL
  Filled 2020-10-03 (×5): qty 20

## 2020-10-03 MED ORDER — FENTANYL CITRATE (PF) 100 MCG/2ML IJ SOLN: 25.0000 ug | INTRAMUSCULAR | Status: DC | PRN

## 2020-10-03 MED ORDER — DIPHENHYDRAMINE HCL 25 MG PO CAPS: 25.0000 mg | ORAL_CAPSULE | Freq: Every evening | ORAL | Status: DC | PRN

## 2020-10-03 MED ORDER — CEFEPIME HCL 2 G IJ SOLR
2.0000 g | Freq: Three times a day (TID) | INTRAMUSCULAR | Status: DC
Start: 2020-10-03 — End: 2020-10-07
  Administered 2020-10-03 – 2020-10-07 (×12): 2 g via INTRAVENOUS
  Filled 2020-10-03 (×14): qty 2

## 2020-10-03 MED ORDER — SODIUM CHLORIDE 0.9% FLUSH: 10.0000 mL | INTRAVENOUS | Status: DC | PRN

## 2020-10-03 MED ORDER — VANCOMYCIN HCL IN DEXTROSE 1-5 GM/200ML-% IV SOLN: 1000.0000 mg | Freq: Two times a day (BID) | INTRAVENOUS | Status: DC

## 2020-10-03 MED ORDER — CHLORHEXIDINE GLUCONATE CLOTH 2 % EX PADS
6.0000 | MEDICATED_PAD | Freq: Every day | CUTANEOUS | Status: DC
  Administered 2020-10-03 – 2020-10-06 (×4): 6 via TOPICAL

## 2020-10-03 MED ORDER — ENOXAPARIN SODIUM 40 MG/0.4ML IJ SOSY
40.0000 mg | PREFILLED_SYRINGE | INTRAMUSCULAR | Status: DC
  Administered 2020-10-03 – 2020-10-07 (×5): 40 mg via SUBCUTANEOUS
  Filled 2020-10-03 (×5): qty 0.4

## 2020-10-03 NOTE — ED Notes (Signed)
Patient transported to CT 

## 2020-10-03 NOTE — Progress Notes (Signed)
NAME:  Albert Flowers, MRN:  [id number], DOB:  1970/04/16, LOS: 0 ADMISSION DATE:  10/02/2020, CONSULTATION DATE:  10/03/2020 REFERRING MD:  Dr. Tamala Julian, CHIEF COMPLAINT:  Pulmonary Nodules with known metastatic disease   History of Present Illness:  Albert Flowers is a 50 y.o. 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   Of note patient was recently admitted and treated at this facility 7/2-7/8 for CAP of the RML for which he received cefepime and azithromycin and later discharged on Levaquin.  Patient was seen post discharge in the pulmonary clinic by Dr. Lamonte Sakai who reccommended repeat super D CT of the chest to evaluate for need of possible bronch.   Workup on arrival revealed leukopenia, thrombocytopenia, hyperglycemia without evidence of DKA or HHS, and mild hyponatremia. CTA chest was obtained and revealed RLL opacity and new mild right upper lobe infection. Patient was started on HCAP coverage and admitted to hospitilist service. 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 rate 17, height _0  (1.727 m), weight 63.6 kg, SpO2 97 %.        Intake/Output Summary (Last 24 hours) at 10/03/2020 1408 Last data filed at 10/03/2020 0455 Gross per 24 hour  Intake 1100 ml  Output --  Net 1100 ml   Filed Weights   10/02/20 2019 10/02/20 2302 10/03/20 0616  Weight: 65 kg 61.2 kg 63.6 kg     Examination: General: Chronically ill appearing thin frail elderly male lying in bed in NAD  HEENT: [place]/AT, MM pink/moist, PERRL,  Neuro: Alert and oriented x3, non-focal,  CV: s1s2 regular rate and rhythm, no murmur, rubs, or gallops,  PULM:  No increased work of breathing, no added breath sounds, ocygen saturations appropriate on RA GI: soft, bowel sounds active in all 4 quadrants, non-tender, non-distended, tolerating TF Extremities: warm/dry, no edema  Skin: no rashes or lesions  Resolved Hospital Problem list     Assessment & Plan:  Right lung nodules  -In the setting of possible HCAP and known metastatic 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 Lamonte Sakai to determine benefit of brocnsopy while impatient  Continue HCAP coverage  Pulmonary hygiene  Adequate pain control to avoid pulmonary splinting  Mobilize as able Aspiration precautions   Best Practice    Diet/type: Regular consistency (see orders) DVT prophylaxis: LMWH GI prophylaxis: N/A Lines: Port a cath, yes and it is still needed Foley:  N/A Code Status:  full code Last date of multidisciplinary goals of care discussion: per primary   Labs   CBC: Recent Labs  Lab 10/02/20 2019 10/03/20 0848  WBC 3.9* 2.9*  NEUTROABS  --  2.0  HGB 9.4* 8.7*  HCT 29.1* 26.1*  MCV 98.3 97.4  PLT 125* 128*    Basic Metabolic Panel: Recent Labs  Lab 10/02/20 2019 10/03/20 0848  NA 130* 133*  K 3.9 4.2  CL 96* 102  CO2 24 24  GLUCOSE 302* 114*  BUN 15 11  CREATININE 0.55* 0.45*  CALCIUM 8.7* 8.8*   GFR: Estimated Creatinine Clearance: 98.3 mL/min (A) (by C-G formula based on SCr of 0.45 mg/dL  (L)). Recent Labs  Lab 10/02/20 2019 10/03/20 0848  PROCALCITON  --  0.24  WBC 3.9* 2.9*  LATICACIDVEN  --  0.7    Liver Function Tests: Recent Labs  Lab 10/03/20 0848  AST 21  ALT 21  ALKPHOS 110  BILITOT 0.3  PROT 6.1*  ALBUMIN 2.4*   No results for input(s): LIPASE, AMYLASE in the last 168 hours. No results for input(s): AMMONIA in the last 168 hours.  ABG No results found for: PHART, PCO2ART, PO2ART, HCO3, TCO2, ACIDBASEDEF, O2SAT   Coagulation Profile: No results for input(s): INR, PROTIME in the last 168 hours.  Cardiac Enzymes: No results for input(s): CKTOTAL, CKMB, CKMBINDEX, TROPONINI in the last 168 hours.  HbA1C: Hgb A1c MFr Bld  Date/Time Value Ref Range Status  09/18/2020 12:40 AM 7.7 (H) 4.8 - 5.6 % Final    Comment:    (NOTE) Pre diabetes:          5.7%-6.4%  Diabetes:              >6.4%  Glycemic control for   <7.0% adults with diabetes     CBG: Recent Labs  Lab 10/03/20 0900  GLUCAP 97    Review of Systems:   Please see the history of present illness. All other systems reviewed and are negative   Past Medical History:  He,  has a past medical history of Diabetes mellitus without complication ([place]) and Malignant neoplasm of base of tongue ([place]) (06/02/2018).   Surgical History:   Past Surgical History:  Procedure Laterality Date   IR IMAGING GUIDED PORT INSERTION  07/15/2020   left neck dissection Left 05/04/2019   Left Neck Dissection by Dr. [name] at [facility]    neck sugery     as a child "had a knot removed from neck" not sure which side.    RIght neck dissection Right 06/16/2018   TORS and right neck dissection. Dr. [name] at [facility]     Social History:   reports that he has never smoked. He has never used smokeless tobacco. He reports current alcohol use. He reports that he does not use drugs.   Family History:  His family history is not on file.   Allergies Allergies  Allergen Reactions   Penicillins Other (See  Comments)    Unknown reaction     Home Medications  Prior to Admission medications   Medication Sig Start Date End Date Taking? Authorizing Provider  dexamethasone (DECADRON) 4 MG tablet Take 2 tablets (8 mg total) by mouth daily. Start the day after carboplatin chemotherapy for 3 days. 07/12/20  Yes Benay Pike, MD  diphenhydrAMINE (BENADRYL) 25 MG tablet Take 1 tablet (25 mg total) by mouth at bedtime. 09/23/20  Yes Florencia Reasons, MD  gabapentin (NEURONTIN) 300 MG capsule Take 900 mg by mouth 3 (three) times daily.   Yes [provider]  lactose free nutrition (BOOST) LIQD Take 237 mLs by mouth 3 (three) times daily between meals.   Yes [provider]  lidocaine-prilocaine (EMLA) cream Apply to affected area once Patient taking differently: Apply 1 application topically as needed (for access ports). 07/12/20  Yes Benay Pike, MD  magic mouthwash SOLN Take 15 mLs by mouth 3 (three) times daily.   Yes [provider]  megestrol (MEGACE ES) 625 MG/5ML suspension Take 5 mLs (625 mg total) by mouth daily. 07/22/20  Yes Benay Pike, MD  mirtazapine (REMERON) 15 MG tablet Take 2 tablets (30 mg total) by mouth at bedtime. 09/14/20  Yes Benay Pike, MD  ondansetron (ZOFRAN ODT) 8 MG disintegrating tablet Take 1 tablet (8 mg total) by mouth every 8 (eight) hours as needed for nausea or vomiting. 05/06/20  Yes [name], MD  polyethylene glycol (MIRALAX / GLYCOLAX) 17 g packet Take 17 g by mouth daily.   Yes [provider]  blood glucose meter kit and supplies KIT Dispense based on patient and insurance preference. Use up to four times daily as directed. 09/23/20   Florencia Reasons, MD  Insulin Pen Needle 29G X 5MM MISC For sliding scale insulin injection 09/23/20   Florencia Reasons, MD  insulin aspart (NOVOLOG) 100 UNIT/ML FlexPen Before each meal 3 times a day, 140-199 - 2 units, 200-250 - 4 units, 251-299 - 6 units,  300-349 - 8 units,  350 or above 10 units. Insulin PEN if  approved, provide syringes and needles if needed. Patient not taking: No sig reported 09/23/20 10/03/20  Florencia Reasons, MD     Critical care time: N/A  Mouhamed Glassco D. Kenton Kingfisher, NP-C [facility] Personal contact information can be found on Amion  10/03/2020, 3:15 PM

## 2020-10-03 NOTE — Progress Notes (Signed)
IVT: Blood work completed per unit RN,already placed IV access.

## 2020-10-03 NOTE — Progress Notes (Signed)
Pharmacy Antibiotic Note  Albert Flowers is a 50 y.o. male admitted on 10/02/2020 with pneumonia.  Pharmacy has been consulted for Vancomycin/Cefepime dosing. WBC 3.9. Renal function ok.   Plan: -Vancomycin 1250 mg IV x 1, then 1000 mg IV q12h >>Estimated AUC: 465 -Cefepime 2g IV x 1 in the ED, f/u additional gram negative coverage -Trend WBC, temp, renal function  -F/U infectious work-up -Drug levels as indicated   Height: 5\' 10"  (177.8 cm) Weight: 61.2 kg (135 lb) IBW/kg (Calculated) : 73  Temp (24hrs), Avg:98.7 F (37.1 C), Min:98.5 F (36.9 C), Max:98.9 F (37.2 C)  Recent Labs  Lab 10/02/20 2019  WBC 3.9*  CREATININE 0.55*    Estimated Creatinine Clearance: 94.6 mL/min (A) (by C-G formula based on SCr of 0.55 mg/dL (L)).    Allergies  Allergen Reactions   Penicillins Other (See Comments)    Unknown reaction    Narda Bonds, PharmD, BCPS Clinical Pharmacist Phone: [phone]

## 2020-10-03 NOTE — ED Provider Notes (Signed)
[facility] EMERGENCY DEPARTMENT Provider Note   CSN: [id number] Arrival date & time: 10/02/20  2008     History Chief Complaint  Patient presents with   Chest Pain    Albert Flowers is a 50 y.o. 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  The patient recently had an admission from June 2 through 8 for community-acquired pneumonia of the right middle lobe of the lung.  He was initially treated with cefepime and azithromycin and was discharged home with Levaquin.  He did have some improvement in his symptoms, but does note that after several days of being discharged from the hospital that he developed constant, progressively worsening shortness of breath and cough.  Over the last few days, he has been more fatigued.  His wife has noted that he has looked more pale and he has slept almost constantly for the last 48 hours.  Yesterday, he developed left-sided intermittent, sharp, nonradiating chest pain.  Chest pain is brought on with coughing and taking deep breaths.  No known alleviating factors.  He denies fever or chills, but his wife notes that his oral temp tonight was 99.6 and he had mild tachycardia in the low 100s at home.  She reports that his blood pressures typically 521V to 471T systolic over 95-39 and was 94/72 when she checked them at home.  He denies vomiting, diarrhea, dizziness, lightheadedness, leg swelling, back pain, abdominal pain, orthopnea, rash, headache.  No known sick contacts.  The history is provided by medical records and the patient. No language interpreter was used.      Past Medical History:  Diagnosis Date   Diabetes mellitus without complication ([place])    Malignant neoplasm of base of tongue ([place]) 06/02/2018    Patient Active Problem List   Diagnosis  Date Noted   HCAP (healthcare-associated pneumonia) 10/03/2020   Abnormal CT of the chest 09/27/2020   Community acquired pneumonia of right middle lobe of lung 09/17/2020   Sepsis ([place]) 08/29/2020   Pancytopenia ([place]) 08/29/2020   Nausea & vomiting 08/29/2020   Generalized weakness 08/29/2020   Acute hyponatremia 08/29/2020   Dehydration 08/29/2020   Protein calorie malnutrition ([place]) 08/29/2020   Diabetes mellitus without complication ([place])    [id number] virus infection 08/28/2020   Cough 08/24/2020   Cancer related pain 08/09/2020   Mucositis due to chemotherapy 07/27/2020   Intractable hiccoughs 07/27/2020   Weight loss, unintentional 07/27/2020   Anorexia 07/12/2020   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 [name] at [facility].    neck sugery     as a child "had a knot removed from neck" not sure which side.    RIght neck dissection Right 06/16/2018   TORS and right neck dissection. Dr. [name] at [facility]       No family history on file.  Social History   Tobacco Use   Smoking status: Never   Smokeless tobacco: Never  Vaping Use   Vaping Use: Never  used  Substance Use Topics   Alcohol use: Yes    Comment: occasional   Drug use: Never    Home Medications Prior to Admission medications   Medication Sig Start Date End Date Taking? Authorizing Provider  dexamethasone (DECADRON) 4 MG tablet Take 2 tablets (8 mg total) by mouth daily. Start the day after carboplatin chemotherapy for 3 days. 07/12/20  Yes Benay Pike, MD  diphenhydrAMINE (BENADRYL) 25 MG tablet Take 1 tablet (25 mg total) by mouth at bedtime. 09/23/20  Yes Florencia Reasons,  MD  gabapentin (NEURONTIN) 300 MG capsule Take 900 mg by mouth 3 (three) times daily.   Yes [provider]  lactose free nutrition (BOOST) LIQD Take 237 mLs by mouth 3 (three) times daily between meals.   Yes [provider]  lidocaine-prilocaine (EMLA) cream Apply to affected area once Patient taking differently: Apply 1 application topically as needed (for access ports). 07/12/20  Yes Benay Pike, MD  magic mouthwash SOLN Take 15 mLs by mouth 3 (three) times daily.   Yes [provider]  megestrol (MEGACE ES) 625 MG/5ML suspension Take 5 mLs (625 mg total) by mouth daily. 07/22/20  Yes Benay Pike, MD  mirtazapine (REMERON) 15 MG tablet Take 2 tablets (30 mg total) by mouth at bedtime. 09/14/20  Yes Benay Pike, MD  ondansetron (ZOFRAN ODT) 8 MG disintegrating tablet Take 1 tablet (8 mg total) by mouth every 8 (eight) hours as needed for nausea or vomiting. 05/06/20  Yes [name], MD  polyethylene glycol (MIRALAX / GLYCOLAX) 17 g packet Take 17 g by mouth daily.   Yes [provider]  blood glucose meter kit and supplies KIT Dispense based on patient and insurance preference. Use up to four times daily as directed. 09/23/20   Florencia Reasons, MD  feeding supplement, GLUCERNA SHAKE, (GLUCERNA SHAKE) LIQD Take 237 mLs by mouth 3 (three) times daily between meals. Patient not taking: No sig reported 09/23/20   Florencia Reasons, MD  guaiFENesin (ROBITUSSIN) 100 MG/5ML SOLN Take 5 mLs (100 mg total) by mouth every 4 (four) hours as needed for cough or to loosen phlegm. Patient not taking: No sig reported 09/23/20   Florencia Reasons, MD  insulin aspart (NOVOLOG) 100 UNIT/ML FlexPen Before each meal 3 times a day, 140-199 - 2 units, 200-250 - 4 units, 251-299 - 6 units,  300-349 - 8 units,  350 or above 10 units. Insulin PEN if approved, provide syringes and needles if needed. Patient not taking: No sig reported 09/23/20   Florencia Reasons, MD  Insulin Pen Needle 29G X 5MM MISC For sliding  scale insulin injection 09/23/20   Florencia Reasons, MD    Allergies    Penicillins  Review of Systems   Review of Systems  Constitutional:  Positive for fatigue. Negative for appetite change, chills and fever.  HENT:  Negative for congestion and sore throat.   Eyes:  Negative for visual disturbance.  Respiratory:  Positive for cough and shortness of breath. Negative for choking and wheezing.   Cardiovascular:  Positive for chest pain. Negative for palpitations.  Gastrointestinal:  Negative for abdominal pain, constipation, diarrhea, nausea and vomiting.  Genitourinary:  Negative for difficulty urinating and dysuria.  Musculoskeletal:  Negative for arthralgias, back pain, myalgias, neck pain and neck stiffness.  Skin:  Negative for rash and wound.  Allergic/Immunologic: Negative for immunocompromised state.  Neurological:  Positive for weakness. Negative for dizziness, seizures, syncope, numbness and headaches.  Psychiatric/Behavioral:  Negative for confusion.  Physical Exam Updated Vital Signs BP 95/67 (BP Location: Left Arm)   Pulse 84   Temp 98.7 F (37.1 C) (Oral)   Resp (!) 25   Ht $R'5\' 8"'sf$  (1.727 m)   Wt 63.6 kg   SpO2 100%   BMI 21.32 kg/m   Physical Exam Vitals and nursing note reviewed.  Constitutional:      Comments: Thin, chronically ill-appearing male  HENT:     Head: Normocephalic.     Nose: Nose normal. No congestion or rhinorrhea.     Mouth/Throat:     Mouth: Mucous membranes are dry.  Eyes:     Conjunctiva/sclera: Conjunctivae normal.  Cardiovascular:     Rate and Rhythm: Regular rhythm. Tachycardia present.     Pulses: Normal pulses.     Heart sounds: Normal heart sounds. No murmur heard.   No friction rub. No gallop.  Pulmonary:     Effort: Pulmonary effort is normal. No respiratory distress.     Breath sounds: No stridor. No wheezing, rhonchi or rales.     Comments: Focal tenderness to palpation to the left chest wall, around the level of the axilla.  No  crepitus or step-offs.  No overlying rashes, fluctuance, erythema, or warmth. Chest:     Chest wall: Tenderness present.  Abdominal:     General: There is no distension.     Palpations: Abdomen is soft. There is no mass.     Tenderness: There is no abdominal tenderness. There is no right CVA tenderness, left CVA tenderness, guarding or rebound.     Hernia: No hernia is present.  Musculoskeletal:        General: No tenderness.     Cervical back: Normal range of motion and neck supple. No tenderness.     Right lower leg: No edema.     Left lower leg: No edema.  Lymphadenopathy:     Cervical: No cervical adenopathy.  Skin:    General: Skin is warm and dry.     Coloration: Skin is pale. Skin is not jaundiced.  Neurological:     Mental Status: He is alert.  Psychiatric:        Behavior: Behavior normal.    ED Results / Procedures / Treatments   Labs (all labs ordered are listed, but only abnormal results are displayed) Labs Reviewed  BASIC METABOLIC PANEL - Abnormal; Notable for the following components:      Result Value   Sodium 130 (*)    Chloride 96 (*)    Glucose, Bld 302 (*)    Creatinine, Ser 0.55 (*)    Calcium 8.7 (*)    All other components within normal limits  CBC - Abnormal; Notable for the following components:   WBC 3.9 (*)    RBC 2.96 (*)    Hemoglobin 9.4 (*)    HCT 29.1 (*)    RDW 17.7 (*)    Platelets 125 (*)    All other components within normal limits  SARS CORONAVIRUS 2 (TAT 6-24 HRS)  BRAIN NATRIURETIC PEPTIDE  TROPONIN I (HIGH SENSITIVITY)  TROPONIN I (HIGH SENSITIVITY)    EKG EKG Interpretation  Date/Time:  Sunday October 02 2020 20:19:34 EDT Ventricular Rate:  107 PR Interval:  120 QRS Duration: 74 QT Interval:  350 QTC Calculation: 467 R Axis:   24 Text Interpretation: Sinus tachycardia No significant change since last tracing Confirmed by Addison Lank [phone]) on 10/03/2020 12:06:07 AM  Radiology DG Chest 2 View  Result Date:  10/02/2020 CLINICAL DATA:  Chest pain EXAM: CHEST - 2 VIEW COMPARISON:  09/27/2020 FINDINGS: The lungs are symmetrically well expanded. Small bilateral pleural effusions are present, new since prior examination. Medial right basilar consolidation is unchanged. No pneumothorax. Right internal jugular chest port is seen with its tip at the superior cavoatrial junction. Cardiac size within normal limits. Pulmonary vascularity is normal. No acute bone abnormality. IMPRESSION: Interval development of small bilateral pleural effusions. Stable right medial basilar consolidation. Electronically Signed   By: Fidela Salisbury MD   On: 10/02/2020 21:00   CT Angio Chest PE W and/or Wo Contrast  Result Date: 10/03/2020 CLINICAL DATA:  Left chest pain EXAM: CT ANGIOGRAPHY CHEST WITH CONTRAST TECHNIQUE: Multidetector CT imaging of the chest was performed using the standard protocol during bolus administration of intravenous contrast. Multiplanar CT image reconstructions and MIPs were obtained to evaluate the vascular anatomy. CONTRAST:  37mL OMNIPAQUE IOHEXOL 350 MG/ML SOLN COMPARISON:  Chest radiographs dated 10/02/2020. CTA chest dated 09/17/2020. FINDINGS: Cardiovascular: Satisfactory opacification of the bilateral pulmonary arteries to the lobar level. No evidence of pulmonary embolism. Although not tailored for evaluation of the thoracic aorta, there is no evidence of thoracic aortic aneurysm or dissection. Mild atherosclerotic calcifications of the aortic arch. Heart is normal in size.  No pericardial effusion. Mild coronary atherosclerosis of the LAD. Mediastinum/Nodes: No suspicious mediastinal lymphadenopathy. Visualized thyroid is unremarkable. Lungs/Pleura: Scarring/radiation changes at the lung apices. Patchy opacity in the medial right lower lobe (series 5/image 93), less rounded/masslike on the current study, continuing to favor infection/pneumonia. Associated small bilateral pleural effusions, unchanged on the  right and new on the left. Associated mild compressive atelectasis in the left lower lobe. 14 mm right middle lobe nodule (series 6/image 82), previously 12 mm. 6 mm posterior right lower lobe nodule (series 6/image 54), previously 4 mm. These are suspicious for metastatic disease when correlating with prior PET. Mild patchy/ground-glass opacities in the right upper lobe (series 6/image 68), suspicious for additional infection/pneumonia, new. Mild vague ground-glass opacity in the left lower lobe (series 6/image 88), equivocal. Debris in the trachea (series 6/image 31), likely reflecting secretions. No pneumothorax. Upper Abdomen: Visualized upper abdomen is grossly unremarkable. Musculoskeletal: Multifocal osseous metastases involving the left sternum, multiple ribs, multiple thoracic vertebral bodies, and the right proximal humerus, grossly unchanged. However, the soft tissue component along the left anterior 4th rib is more conspicuous on the current study, suggesting mild progression (series 5/image 84). Review of the MIP images confirms the above findings. IMPRESSION: No evidence of pulmonary embolism. Patchy right lower lobe opacity remains suspicious for pneumonia. Additional mild right upper lobe infection/pneumonia. Small bilateral pleural effusions, progressive on the left. Two right lung nodules are mildly progressive from recent CT, suspicious for metastatic disease. Mildly progressive soft tissue component associated with left anterior 4th rib metastasis. Additional multifocal osseous metastases are unchanged. Aortic Atherosclerosis (ICD10-I70.0). Electronically Signed   By: Julian Hy M.D.   On: 10/03/2020 03:04    Procedures Procedures   Medications Ordered in ED Medications  fentaNYL (SUBLIMAZE) injection 50 mcg (has no administration in time range)  vancomycin (VANCOREADY) IVPB 1250 mg/250 mL (1,250 mg Intravenous New Bag/Given 10/03/20 0456)  vancomycin (VANCOCIN) IVPB 1000 mg/200 mL  premix (has no administration in time range)  lactated ringers bolus 1,000 mL (0 mLs Intravenous Stopped 10/03/20 0215)  iohexol (OMNIPAQUE) 350 MG/ML injection 50 mL (50 mLs Intravenous Contrast Given 10/03/20 0243)  ceFEPIme (MAXIPIME) 2 g in sodium chloride 0.9 % 100 mL IVPB (  0 g Intravenous Stopped 10/03/20 0455)    ED Course  I have reviewed the triage vital signs and the nursing notes.  Pertinent labs & imaging results that were available during my care of the patient were reviewed by me and considered in my medical decision making (see chart for details).    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 He has a recent admission for pneumonia.  Mild tachycardia and tachypnea on arrival. Afebrile. Normotensive. Labs and imaging have been reviewed and independently interpreted by me. The patient was discussed with Dr. Leonette Monarch, attending physician.  Leukopenia to 3.9 and thrombopenia. Glucose is 302. No DKA or HHA. Mild hyponatremia, unchanged from previous. CXR with new bilateral pleural effusions. EKG with sinus tachycardia. Otherwise unchanged.   Given multiple risk factors for PE with new pleuritic chest pain, PE study obtained. Negative for PE. There is a mildly progressed soft tissue component with left anterior 4th rib metastasis that  corresponds to the patient's reproducible CP on exam. CT also demonstrates RLL opacity and new mild right upper lobe infection. Given worsening, unresolved pneumonia, will admit the patient. Vancomycin and Cefepime initiated in the ED. Dr. Nevada Crane with the hospitalist team has accepted the patient for admission. The patient appears reasonably stabilized for admission considering the  current resources, flow, and capabilities available in the ED at this time, and I doubt any other [facility] requiring further screening and/or treatment in the ED prior to admission.     Final Clinical Impression(s) / ED Diagnoses Final diagnoses:  Multifocal pneumonia    Rx / DC Orders ED Discharge Orders     None        Joanne Gavel, PA-C 10/03/20 [id number]    Fatima Blank, MD 10/03/20 2105

## 2020-10-03 NOTE — H&P (Signed)
History and Physical    [name] [name] DOB: 08/28/1970 DOA: 10/02/2020  Referring MD/NP/PA: Irene Pap, DO PCP: [name], MD  Patient coming from: home  Chief Complaint: Chest pain  I have personally briefly reviewed patient's old medical records in [place]   HPI: Albert Flowers is a 50 y.o. 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  He presented with complaints of chest pain after gaining discharged from the hospital patient completed his course of antibiotics and seemed to be getting better initially.  He was able to follow-up with Dr. Lamonte Sakai on 7/12 and records note plan is for repeat CT of his chest with super deep cuts for August with concern for need of bronchoscopy and biopsy.  However, over the last 2-3 days have began to become more fatigued with reports of worsening cough and shortness of breath.  Cough is nonproductive.  Patient reports having sharp left-sided chest pain with coughing.  Left-sided chest pain is also described as pleuritic in nature and worse with certain movements.  Pain does not radiate.  At home patient had been sleeping the whole day away.  His blood pressures were noted to be around 92/54 and heart rates in the low 100s.  He was not noted to have any fever or chills.  Since getting home patient has been on a regular diet as is difficult to get him to eat.  Patient denies feeling as though food goes down the wrong way, but does report intermittently coughing while eating.  He is followed by Dr. Chryl Heck of oncology in outpatient setting and is scheduled to have infusion tomorrow.  He had been taken off one of his medications due to mucositis due to chemotherapy treatments.   ED  Course: Upon admission into the emergency department patient was noted to be afebrile, pulse 83-1 09, respiration 19-35, and all other vital signs maintained.  CT angiogram of the chest was obtained which showed no signs of a pulmonary embolus, but did note a patchy right lower lobe opacity suspicious for pneumonia with additional mild right upper lobe infection/pneumonia and small bilateral pleural effusions with progression of the left and mild progression of the 2 right lung nodules from recent CT given concern for metastases and mild progressive soft, tissue component associated of the left anterior fourth rib. The patient had been given lactated ringers 1L, fentanyl, vancomycin, and cefepime.  Review of Systems  Constitutional:  Positive for malaise/fatigue. Negative for fever.  HENT:  Negative for ear discharge and nosebleeds.   Eyes:  Negative for photophobia and pain.  Respiratory:  Positive for cough. Negative for sputum production.   Cardiovascular:  Positive for chest pain. Negative for leg swelling.  Gastrointestinal:  Negative for abdominal pain, diarrhea, nausea and vomiting.  Genitourinary:  Negative for dysuria and hematuria.  Musculoskeletal:  Positive for joint pain and myalgias. Negative for falls.  Skin:  Negative for rash.  Neurological:  Negative for focal weakness and loss of consciousness.  Psychiatric/Behavioral:  Negative for substance abuse. The patient does not have insomnia.    Past Medical History:  Diagnosis Date   Diabetes mellitus without complication ([place])    Malignant neoplasm of base of tongue ([place]) 06/02/2018    Past Surgical History:  Procedure Laterality Date   IR  IMAGING GUIDED PORT INSERTION  07/15/2020   left neck dissection Left 05/04/2019   Left Neck Dissection by Dr. [name] at [facility].    neck sugery     as a child "had a knot removed from neck" not sure which side.    RIght neck dissection Right 06/16/2018   TORS and right neck dissection. Dr.  [name] at [facility]     reports that he has never smoked. He has never used smokeless tobacco. He reports current alcohol use. He reports that he does not use drugs.  Allergies  Allergen Reactions   Penicillins Other (See Comments)    Unknown reaction    No significant family history reported  Prior to Admission medications   Medication Sig Start Date End Date Taking? Authorizing Provider  dexamethasone (DECADRON) 4 MG tablet Take 2 tablets (8 mg total) by mouth daily. Start the day after carboplatin chemotherapy for 3 days. 07/12/20  Yes Benay Pike, MD  diphenhydrAMINE (BENADRYL) 25 MG tablet Take 1 tablet (25 mg total) by mouth at bedtime. 09/23/20  Yes Florencia Reasons, MD  gabapentin (NEURONTIN) 300 MG capsule Take 900 mg by mouth 3 (three) times daily.   Yes [provider]  lactose free nutrition (BOOST) LIQD Take 237 mLs by mouth 3 (three) times daily between meals.   Yes [provider]  lidocaine-prilocaine (EMLA) cream Apply to affected area once Patient taking differently: Apply 1 application topically as needed (for access ports). 07/12/20  Yes Benay Pike, MD  magic mouthwash SOLN Take 15 mLs by mouth 3 (three) times daily.   Yes [provider]  megestrol (MEGACE ES) 625 MG/5ML suspension Take 5 mLs (625 mg total) by mouth daily. 07/22/20  Yes Benay Pike, MD  mirtazapine (REMERON) 15 MG tablet Take 2 tablets (30 mg total) by mouth at bedtime. 09/14/20  Yes Benay Pike, MD  ondansetron (ZOFRAN ODT) 8 MG disintegrating tablet Take 1 tablet (8 mg total) by mouth every 8 (eight) hours as needed for nausea or vomiting. 05/06/20  Yes [name], MD  polyethylene glycol (MIRALAX / GLYCOLAX) 17 g packet Take 17 g by mouth daily.   Yes [provider]  blood glucose meter kit and supplies KIT Dispense based on patient and insurance preference. Use up to four times daily as directed. 09/23/20   Florencia Reasons, MD  feeding supplement, GLUCERNA SHAKE,  (GLUCERNA SHAKE) LIQD Take 237 mLs by mouth 3 (three) times daily between meals. Patient not taking: No sig reported 09/23/20   Florencia Reasons, MD  guaiFENesin (ROBITUSSIN) 100 MG/5ML SOLN Take 5 mLs (100 mg total) by mouth every 4 (four) hours as needed for cough or to loosen phlegm. Patient not taking: No sig reported 09/23/20   Florencia Reasons, MD  insulin aspart (NOVOLOG) 100 UNIT/ML FlexPen Before each meal 3 times a day, 140-199 - 2 units, 200-250 - 4 units, 251-299 - 6 units,  300-349 - 8 units,  350 or above 10 units. Insulin PEN if approved, provide syringes and needles if needed. Patient not taking: No sig reported 09/23/20   Florencia Reasons, MD  Insulin Pen Needle 29G X 5MM MISC For sliding scale insulin injection 09/23/20   Florencia Reasons, MD    Physical Exam:  Constitutional: Chronically ill-appearing middle-aged male no acute distress Vitals:   10/03/20 0345 10/03/20 0415 10/03/20 0530 10/03/20 0616  BP: $Re'99/71 93/71 99/69 'szN$ 95/67  Pulse: 85 84 84   Resp: (!) 22 19 (!) 25   Temp:  98.7 F (37.1 C)  TempSrc:    Oral  SpO2: 98% 97% 97% 100%  Weight:    63.6 kg  Height:    '5\' 8"'$  (1.727 m)   Eyes: PERRL, lids and conjunctivae normal ENMT: Mucous membranes are dry. Posterior pharynx clear of any exudate or lesions.   Neck: normal, supple, no masses, no thyromegaly Respiratory: Decreased aeration with no significant wheezing or rhonchi appreciated.  Patient able to talk in complete sentences at this time. Cardiovascular: Tenderness to palpation of the left chest wall.  No significant wheezes or rhonchi appreciated at this time.  Patient able to maintain O2 saturations on room air.  Home Abdomen: no tenderness, no masses palpated. No hepatosplenomegaly. Bowel sounds positive.  Musculoskeletal: no clubbing / cyanosis. No joint deformity upper and lower extremities. Good ROM, no contractures. Normal muscle tone.  Skin: Pallor present.  No rashes or lesions appreciated Neurologic: CN 2-12 grossly intact. Sensation  intact, DTR normal. Strength 5/5 in all 4.  Psychiatric: Normal judgment and insight. Alert and oriented x 3. Normal mood.     Labs on Admission: I have personally reviewed following labs and imaging studies  CBC: Recent Labs  Lab 10/02/20 2019  WBC 3.9*  HGB 9.4*  HCT 29.1*  MCV 98.3  PLT 681*   Basic Metabolic Panel: Recent Labs  Lab 10/02/20 2019  NA 130*  K 3.9  CL 96*  CO2 24  GLUCOSE 302*  BUN 15  CREATININE 0.55*  CALCIUM 8.7*   GFR: Estimated Creatinine Clearance: 98.3 mL/min (A) (by C-G formula based on SCr of 0.55 mg/dL (L)). Liver Function Tests: No results for input(s): AST, ALT, ALKPHOS, BILITOT, PROT, ALBUMIN in the last 168 hours. No results for input(s): LIPASE, AMYLASE in the last 168 hours. No results for input(s): AMMONIA in the last 168 hours. Coagulation Profile: No results for input(s): INR, PROTIME in the last 168 hours. Cardiac Enzymes: No results for input(s): CKTOTAL, CKMB, CKMBINDEX, TROPONINI in the last 168 hours. BNP (last 3 results) No results for input(s): PROBNP in the last 8760 hours. HbA1C: No results for input(s): HGBA1C in the last 72 hours. CBG: No results for input(s): GLUCAP in the last 168 hours. Lipid Profile: No results for input(s): CHOL, HDL, LDLCALC, TRIG, CHOLHDL, LDLDIRECT in the last 72 hours. Thyroid Function Tests: No results for input(s): TSH, T4TOTAL, FREET4, T3FREE, THYROIDAB in the last 72 hours. Anemia Panel: No results for input(s): VITAMINB12, FOLATE, FERRITIN, TIBC, IRON, RETICCTPCT in the last 72 hours. Urine analysis:    Component Value Date/Time   COLORURINE YELLOW 09/17/2020 2135   APPEARANCEUR CLEAR 09/17/2020 2135   LABSPEC 1.020 09/17/2020 2135   PHURINE 8.0 09/17/2020 2135   GLUCOSEU NEGATIVE 09/17/2020 2135   HGBUR NEGATIVE 09/17/2020 2135   BILIRUBINUR NEGATIVE 09/17/2020 2135   KETONESUR NEGATIVE 09/17/2020 2135   PROTEINUR NEGATIVE 09/17/2020 2135   NITRITE NEGATIVE 09/17/2020 2135    LEUKOCYTESUR NEGATIVE 09/17/2020 2135   Sepsis Labs: No results found for this or any previous visit (from the past 240 hour(s)).   Radiological Exams on Admission: DG Chest 2 View  Result Date: 10/02/2020 CLINICAL DATA:  Chest pain EXAM: CHEST - 2 VIEW COMPARISON:  09/27/2020 FINDINGS: The lungs are symmetrically well expanded. Small bilateral pleural effusions are present, new since prior examination. Medial right basilar consolidation is unchanged. No pneumothorax. Right internal jugular chest port is seen with its tip at the superior cavoatrial junction. Cardiac size within normal limits. Pulmonary vascularity is normal. No  acute bone abnormality. IMPRESSION: Interval development of small bilateral pleural effusions. Stable right medial basilar consolidation. Electronically Signed   By: Fidela Salisbury MD   On: 10/02/2020 21:00   CT Angio Chest PE W and/or Wo Contrast  Result Date: 10/03/2020 CLINICAL DATA:  Left chest pain EXAM: CT ANGIOGRAPHY CHEST WITH CONTRAST TECHNIQUE: Multidetector CT imaging of the chest was performed using the standard protocol during bolus administration of intravenous contrast. Multiplanar CT image reconstructions and MIPs were obtained to evaluate the vascular anatomy. CONTRAST:  36mL OMNIPAQUE IOHEXOL 350 MG/ML SOLN COMPARISON:  Chest radiographs dated 10/02/2020. CTA chest dated 09/17/2020. FINDINGS: Cardiovascular: Satisfactory opacification of the bilateral pulmonary arteries to the lobar level. No evidence of pulmonary embolism. Although not tailored for evaluation of the thoracic aorta, there is no evidence of thoracic aortic aneurysm or dissection. Mild atherosclerotic calcifications of the aortic arch. Heart is normal in size.  No pericardial effusion. Mild coronary atherosclerosis of the LAD. Mediastinum/Nodes: No suspicious mediastinal lymphadenopathy. Visualized thyroid is unremarkable. Lungs/Pleura: Scarring/radiation changes at the lung apices. Patchy  opacity in the medial right lower lobe (series 5/image 93), less rounded/masslike on the current study, continuing to favor infection/pneumonia. Associated small bilateral pleural effusions, unchanged on the right and new on the left. Associated mild compressive atelectasis in the left lower lobe. 14 mm right middle lobe nodule (series 6/image 82), previously 12 mm. 6 mm posterior right lower lobe nodule (series 6/image 54), previously 4 mm. These are suspicious for metastatic disease when correlating with prior PET. Mild patchy/ground-glass opacities in the right upper lobe (series 6/image 68), suspicious for additional infection/pneumonia, new. Mild vague ground-glass opacity in the left lower lobe (series 6/image 88), equivocal. Debris in the trachea (series 6/image 31), likely reflecting secretions. No pneumothorax. Upper Abdomen: Visualized upper abdomen is grossly unremarkable. Musculoskeletal: Multifocal osseous metastases involving the left sternum, multiple ribs, multiple thoracic vertebral bodies, and the right proximal humerus, grossly unchanged. However, the soft tissue component along the left anterior 4th rib is more conspicuous on the current study, suggesting mild progression (series 5/image 84). Review of the MIP images confirms the above findings. IMPRESSION: No evidence of pulmonary embolism. Patchy right lower lobe opacity remains suspicious for pneumonia. Additional mild right upper lobe infection/pneumonia. Small bilateral pleural effusions, progressive on the left. Two right lung nodules are mildly progressive from recent CT, suspicious for metastatic disease. Mildly progressive soft tissue component associated with left anterior 4th rib metastasis. Additional multifocal osseous metastases are unchanged. Aortic Atherosclerosis (ICD10-I70.0). Electronically Signed   By: Julian Hy M.D.   On: 10/03/2020 03:04    EKG: Independently reviewed.  Sinus tachycardia 107  bpm  Assessment/Plan Suspected healthcare-associated pneumonia: Patient presents with complaints of cough and left-sided chest pain.  CTA of the chest concerning for right lower lobe upper lobe infection for possible pneumonia.  Records note prior concern for possible aspiration, but patient eating what he is able to eat to maintain weight at home. -Admit to a telemetry bed  -Aspiration precautions with elevation of head of the bed -Pneumonia order set utilized  -Follow-up blood and sputum cultures -Cefepime and Azithromycin -PCCM consulted, we will follow-up for further recommendations  SIRS: Acute. Patient was noted to be tachypneic and tachycardic on admission with white blood cell count initially 2.9.  Lactic acid was reassuring at 0.7. -Follow-up blood and sputum culture  Atypical chest pain: Patient reporting complaints of left-sided chest pain with focal tenderness to palpation on chest exam.  High-sensitivity troponins are negative  x2 and EKG without significant ischemic changes.  Question if soft tissue component around the fourth anterior ribs on the left correlates with patient's symptoms.  Metastatic HPV positive squamous cell carcinoma: Patient is followed in the outpatient setting by Dr. Chryl Heck.  He reportedly was scheduled to resume chemotherapy tomorrow.  Repeat CT scan concerning for possible progression of right lobe nodules and soft tissue component of the left anterior fourth rib metastasis.   -Oncology notified of the patient's admission into the hospital  Diabetes Mellitus type 2, with hyperglycemia: On admission glucose 302.  Last hemoglobin A1c was 7.7 on 09/18/2020.  He was not on any medications for treatment. -Hypoglycemic protocols -CBGs before every meal with sensitive SSI -Adjust insulin regimen as needed.  Hyponatremia: Acute on chronic.  Labs significant for sodium 130->133 after patient had received IV fluids. -Continue to monitor  Pancytopenia: Chronic.   Hemoglobin 9.4-> 8.7, WBC 3.9-> 2.9, platelets 125-> 128.  Patient does not report any complaints of bleeding. -Continue to monitor H&H  Recent history of COVID-19 infection: Patient had been treated for COVID-19 with remdesivir in June.  Repeat COVID-19 screening today was negative.  Severe protein calorie malnutrition: Chronic.  Albumin low at 2.4. -Continue Ensure shakes with  DVT prophylaxis: Lovenox Code Status: Full Family Communication: Wife updated at bedside Disposition Plan: Hopefully discharge home once medically Consults called: PCCM Admission status: Inpatient, require more than 2 midnight stay  Norval Morton MD [facility]   If 7PM-7AM, please contact night-coverage   10/03/2020, 7:08 AM

## 2020-10-04 ENCOUNTER — Encounter: Payer: Managed Care, Other (non HMO) | Admitting: Dietician

## 2020-10-04 ENCOUNTER — Ambulatory Visit: Payer: Managed Care, Other (non HMO)

## 2020-10-04 ENCOUNTER — Ambulatory Visit: Payer: Managed Care, Other (non HMO) | Admitting: Hematology and Oncology

## 2020-10-04 ENCOUNTER — Other Ambulatory Visit: Payer: Managed Care, Other (non HMO)

## 2020-10-04 DIAGNOSIS — E871 Hypo-osmolality and hyponatremia: Secondary | ICD-10-CM | POA: Diagnosis not present

## 2020-10-04 DIAGNOSIS — D61818 Other pancytopenia: Secondary | ICD-10-CM | POA: Diagnosis not present

## 2020-10-04 DIAGNOSIS — J189 Pneumonia, unspecified organism: Secondary | ICD-10-CM | POA: Diagnosis not present

## 2020-10-04 LAB — GLUCOSE, CAPILLARY
Glucose-Capillary: 215 mg/dL — ABNORMAL HIGH (ref 70–99)
Glucose-Capillary: 210 mg/dL — ABNORMAL HIGH (ref 70–99)
Glucose-Capillary: 232 mg/dL — ABNORMAL HIGH (ref 70–99)
Glucose-Capillary: 298 mg/dL — ABNORMAL HIGH (ref 70–99)

## 2020-10-04 MED ORDER — INSULIN GLARGINE 100 UNIT/ML ~~LOC~~ SOLN
5.0000 [IU] | Freq: Every day | SUBCUTANEOUS | Status: DC
  Administered 2020-10-04: 5 [IU] via SUBCUTANEOUS
  Filled 2020-10-04 (×2): qty 0.05

## 2020-10-04 MED ORDER — ENSURE ENLIVE PO LIQD
237.0000 mL | Freq: Three times a day (TID) | ORAL | Status: DC
  Administered 2020-10-04 – 2020-10-07 (×6): 237 mL via ORAL

## 2020-10-04 MED ORDER — ADULT MULTIVITAMIN W/MINERALS CH
1.0000 | ORAL_TABLET | Freq: Every day | ORAL | Status: DC
  Administered 2020-10-04 – 2020-10-07 (×4): 1 via ORAL
  Filled 2020-10-04 (×5): qty 1

## 2020-10-04 MED ORDER — SODIUM CHLORIDE 0.9 % IV SOLN: INTRAVENOUS | Status: DC

## 2020-10-04 NOTE — Discharge Instructions (Signed)
[facility] Stay Proper nutrition can help your body recover from illness and injury.   Foods and beverages high in protein, vitamins, and minerals help rebuild muscle loss, promote healing, & reduce fall risk.   In addition to eating healthy foods, a nutrition shake is an easy, delicious way to get the nutrition you need during and after your hospital stay  It is recommended that you continue to drink 2 bottles per day of:       Ensure Complete or supplement with MORE THAN 300 CALORIES AND MORE THAN 20-30 GRAMS OF PROTEIN for at least 1 month (30 days) after your hospital stay   Tips for adding a nutrition shake into your routine: As allowed, drink one with vitamins or medications instead of water or juice Enjoy one as a tasty mid-morning or afternoon snack Drink cold or make a milkshake out of it Drink one instead of milk with cereal or snacks Use as a coffee creamer   Available at the following grocery stores and pharmacies:           * [facility] [phone]            For COUPONS visit: www.[email or web address].com/join or [email or web address]   Suggested Substitutions Ensure Plus = Boost Plus = Carnation Breakfast Essentials = Boost Compact Ensure Active Clear = Boost Breeze Glucerna Shake = Boost Glucose Control = Carnation Breakfast Essentials SUGAR FREE

## 2020-10-04 NOTE — Progress Notes (Addendum)
Initial Nutrition Assessment  DOCUMENTATION CODES:   Severe malnutrition in context of chronic illness  INTERVENTION:  -Continue regular diet -Ensure Enlive po TID, each supplement provides 350 kcal and 20 grams of protein -Magic cup TID with meals, each supplement provides 290 kcal and 9 grams of protein -MVI with minerals daily -Provided education on improving discharge nutrition status  NUTRITION DIAGNOSIS:   Severe Malnutrition related to chronic illness (cancer) as evidenced by severe fat depletion, severe muscle depletion.  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 Pt recently admitted/treated 7/2-7/8 for CAP of the RML for which he received & was discharge with antibiotics. Follow-up workup as outpatient revealed leukopenia, thrombocytopenia, hyperglycemia without evidence of DKA or HHS, and mild hyponatremia. CTA chest was obtained and revealed RLL opacity and new mild right upper lobe infection. Pt was started on HCAP coverage and admitted to hospitilist service. PCCM consulted for additional care.   Pt sleeping at time of RD visit and did wake to RD voice/touch to consent to Nutrition-focused physical exam, but was unable to provide detailed diet history given lethargy. Pt does endorse drinking 4 oral nutrition supplements at home to improve protein intake and is agreeable to use of supplements while admitted. Reviewed preferences with pt and provided education on increasing kcals/protein after discharge.   No PO intake documented.   Medications:   Chlorhexidine Gluconate Cloth  6 each Topical Daily   enoxaparin (LOVENOX) injection  40 mg Subcutaneous Q24H   feeding supplement  237 mL Oral TID BM   gabapentin  900 mg Oral TID   guaiFENesin  600 mg Oral BID   insulin aspart  0-9 Units Subcutaneous TID WC   insulin glargine  5 Units Subcutaneous Daily   magic mouthwash  15 mL Oral TID   megestrol  800 mg Oral Daily   mirtazapine  30 mg Oral QHS   multivitamin with minerals  1 tablet Oral Daily   polyethylene glycol  17 g Oral Daily   Labs: Recent Labs  Lab 10/02/20 2019 10/03/20 0848  NA 130* 133*  K 3.9 4.2  CL 96* 102  CO2 24 24  BUN 15 11  CREATININE 0.55* 0.45*  CALCIUM 8.7* 8.8*  GLUCOSE 302* 114*  CBGs 215-210-232   NUTRITION - FOCUSED PHYSICAL EXAM:  Flowsheet Row Most Recent Value  Orbital Region Mild depletion  Upper Arm Region Severe depletion  Thoracic and Lumbar Region Severe depletion  Buccal Region Moderate depletion  Temple Region Moderate depletion  Clavicle Bone Region Moderate depletion  Clavicle and Acromion Bone Region Severe depletion  Dorsal Hand Moderate depletion  Patellar Region Severe depletion  Anterior Thigh Region Severe depletion  Posterior Calf Region Severe depletion  Edema (RD Assessment) None  Hair Reviewed  Eyes Reviewed  Mouth Reviewed  Skin Reviewed  Nails Reviewed       Diet Order:   Diet Order             Diet regular Room service appropriate? Yes; Fluid consistency: Thin  Diet effective now  EDUCATION NEEDS:   Education needs have been addressed  Skin:  Skin Assessment: Reviewed RN Assessment  Last BM:  7/18  Height:   Ht Readings from Last 1 Encounters:  10/03/20 5\' 8"  (1.727 m)    Weight:   Wt Readings from Last 10 Encounters:  10/03/20 63.6 kg  09/27/20 64.2 kg  09/19/20 61.5 kg  09/12/20 66 kg  08/30/20 63.2 kg  08/24/20 57.7 kg  08/09/20 64.9 kg  08/02/20 64.2 kg  08/01/20 65.2 kg  07/28/20 62.2 kg   BMI:   Body mass index is 21.32 kg/m.  Estimated Nutritional Needs:   Kcal:  2000-2200  Protein:  100-110 grams  Fluid:  >2L    Larkin Ina, MS, RD, LDN (she/her/hers) RD pager number and weekend/on-call pager number located in [place].

## 2020-10-04 NOTE — Progress Notes (Signed)
PROGRESS NOTE    Albert Flowers  XVQ:[id number] DOB: 15-Jul-1970 DOA: 10/02/2020 PCP: [name], MD    Chief Complaint  Patient presents with   Chest Pain    Brief Narrative:   Albert Flowers is a 50 y.o. 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 -Patient presents to ED with complaints of chest pain, findings significant for worsening pneumonia despite being on appropriate antibiotics, as well as significant for pulmonary nodule, he is admitted for further work-up.  Assessment & Plan:   Principal Problem:   HCAP (healthcare-associated pneumonia) Active Problems:   Malignant neoplasm of base of tongue (HCC)   Pancytopenia (HCC)   Hyponatremia   Protein calorie malnutrition (HCC)   Diabetes mellitus without complication (HCC)   SIRS (systemic inflammatory response syndrome) (HCC)   History of COVID-19   Suspected healthcare-associated pneumonia:  -CTA of the chest concerning for right lower lobe upper lobe infection for possible pneumonia.   -Aspiration precautions with elevation of head of the bed -Continue with IV cefepime and azithromycin -Follow-up blood and sputum cultures, Legionella and strep pneumonia urine antigen -I have discussed with the patient, he was encouraged to use incentive spirometry, and flutter valve.  Ns   Right lung nodule -PCCM input greatly appreciated, likely will need bronchoscopy, timing per pulmonary.   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 Chryl Heck  Diabetes Mellitus type 2, with hyperglycemia:  -A1c, CBG uncontrolled, will add low-dose  Lantus, continue with sliding scale.  Hyponatremia:  -Improving, continue with IV fluids   Pancytopenia: Chronic.  Likely due to malignancy and chemotherapy   Recent history of COVID-19 infection: Patient had been treated for COVID-19 with remdesivir in June.  Repeat COVID-19 screening today was negative.   Severe protein calorie malnutrition: Chronic.  Albumin low at 2.4. -Continue Ensure shakes with   DVT prophylaxis: Lovenox Code Status: Full Family Communication: wife at bedside Disposition:   Status is: Inpatient  Remains inpatient appropriate because:IV treatments appropriate due to intensity of illness or inability to take PO  Dispo: The patient is from: Home              Anticipated d/c is to: Home              Patient currently is not medically stable to d/c.   Difficult to place patient No       Consultants:  PCCM   Subjective:  Does report cough, congestion, generalized weakness  Objective: Vitals:   10/03/20 0616 10/03/20 1059 10/03/20 1908 10/04/20 0327  BP: 95/67 112/72 99/67 100/62  Pulse:  89 82 79  Resp: 19 17 18 18   Temp: 98.7 F (37.1 C) 98.2 F (36.8 C) 98.2 F (36.8 C) 98 F (36.7 C)  TempSrc: Oral     SpO2: 100% 97% 98% 98%  Weight: 63.6 kg     Height: 5\' 8"  (1.727 m)       Intake/Output Summary (Last 24 hours) at 10/04/2020 1311 Last data filed at 10/04/2020 0500 Gross per 24 hour  Intake 369.84 ml  Output --  Net 369.84 ml   Filed Weights   10/02/20 2019 10/02/20 2302 10/03/20 0616  Weight:  65 kg 61.2 kg 63.6 kg    Examination:  General exam: Appears calm and comfortable-appearing Respiratory system: Air entry at the bases with some rales Cardiovascular system: S1 & S2 heard, RRR. No JVD, murmurs, rubs, gallops or clicks. No pedal edema. Gastrointestinal system: Abdomen is nondistended, soft and nontender. No organomegaly or masses felt. Normal bowel sounds heard. Central nervous system: Alert and oriented. No focal  neurological deficits. Extremities: Symmetric 5 x 5 power. Skin: No rashes, lesions or ulcers Psychiatry: Judgement and insight appear normal. Mood & affect appropriate.     Data Reviewed: I have personally reviewed following labs and imaging studies  CBC: Recent Labs  Lab 10/02/20 2019 10/03/20 0848  WBC 3.9* 2.9*  NEUTROABS  --  2.0  HGB 9.4* 8.7*  HCT 29.1* 26.1*  MCV 98.3 97.4  PLT 125* 128*    Basic Metabolic Panel: Recent Labs  Lab 10/02/20 2019 10/03/20 0848  NA 130* 133*  K 3.9 4.2  CL 96* 102  CO2 24 24  GLUCOSE 302* 114*  BUN 15 11  CREATININE 0.55* 0.45*  CALCIUM 8.7* 8.8*    GFR: Estimated Creatinine Clearance: 98.3 mL/min (A) (by C-G formula based on SCr of 0.45 mg/dL (L)).  Liver Function Tests: Recent Labs  Lab 10/03/20 0848  AST 21  ALT 21  ALKPHOS 110  BILITOT 0.3  PROT 6.1*  ALBUMIN 2.4*    CBG: Recent Labs  Lab 10/03/20 0900 10/03/20 1643 10/03/20 2004 10/04/20 0759 10/04/20 1148  GLUCAP 97 206* 238* 215* 210*     Recent Results (from the past 240 hour(s))  SARS CORONAVIRUS 2 (TAT 6-24 HRS) Nasopharyngeal Nasopharyngeal Swab     Status: None   Collection Time: 10/03/20  4:52 AM   Specimen: Nasopharyngeal Swab  Result Value Ref Range Status   SARS Coronavirus 2 NEGATIVE NEGATIVE Final    Comment: (NOTE) SARS-CoV-2 target nucleic acids are NOT DETECTED.  The SARS-CoV-2 RNA is generally detectable in upper and lower respiratory specimens during the acute phase of infection. Negative results do not preclude SARS-CoV-2 infection, do not rule out co-infections with other pathogens, and should not be used as the sole basis for treatment or other patient management decisions. Negative results must be combined with clinical observations, patient history, and epidemiological information. The expected result is Negative.  Fact Sheet for Patients: SugarRoll.be  Fact Sheet for Healthcare  Providers: https://www.woods-mathews.com/  This test is not yet approved or cleared by the [place] FDA and  has been authorized for detection and/or diagnosis of SARS-CoV-2 by FDA under an Emergency Use Authorization (EUA). This EUA will remain  in effect (meaning this test can be used) for the duration of the COVID-19 declaration under Se ction 564(b)(1) of the Act, 21 U.S.C. section 360bbb-3(b)(1), unless the authorization is terminated or revoked sooner.  Performed at [facility], [place] [address], [place], [place] [address]   Culture, blood (routine x 2) Call MD if unable to obtain prior to antibiotics being given     Status: None (Preliminary result)   Collection Time: 10/03/20  8:48 AM   Specimen: BLOOD  Result Value Ref Range Status   Specimen Description BLOOD SITE NOT SPECIFIED  Final   Special Requests   Final    BOTTLES DRAWN AEROBIC ONLY Blood Culture adequate volume   Culture   Final    NO GROWTH < 24 HOURS Performed at [facility] [address], [place], [place] [address]  Report Status PENDING  Incomplete  Culture, blood (Routine X 2) w Reflex to ID Panel     Status: None (Preliminary result)   Collection Time: 10/03/20  8:48 AM   Specimen: BLOOD  Result Value Ref Range Status   Specimen Description BLOOD SITE NOT SPECIFIED  Final   Special Requests   Final    BOTTLES DRAWN AEROBIC ONLY Blood Culture adequate volume   Culture   Final    NO GROWTH < 24 HOURS Performed at [facility], [address], [place], [place] [address]    Report Status PENDING  Incomplete         Radiology Studies: DG Chest 2 View  Result Date: 10/02/2020 CLINICAL DATA:  Chest pain EXAM: CHEST - 2 VIEW COMPARISON:  09/27/2020 FINDINGS: The lungs are symmetrically well expanded. Small bilateral pleural effusions are present, new since prior examination. Medial right basilar consolidation is unchanged. No pneumothorax. Right internal jugular  chest port is seen with its tip at the superior cavoatrial junction. Cardiac size within normal limits. Pulmonary vascularity is normal. No acute bone abnormality. IMPRESSION: Interval development of small bilateral pleural effusions. Stable right medial basilar consolidation. Electronically Signed   By: Fidela Salisbury MD   On: 10/02/2020 21:00   CT Angio Chest PE W and/or Wo Contrast  Result Date: 10/03/2020 CLINICAL DATA:  Left chest pain EXAM: CT ANGIOGRAPHY CHEST WITH CONTRAST TECHNIQUE: Multidetector CT imaging of the chest was performed using the standard protocol during bolus administration of intravenous contrast. Multiplanar CT image reconstructions and MIPs were obtained to evaluate the vascular anatomy. CONTRAST:  20mL OMNIPAQUE IOHEXOL 350 MG/ML SOLN COMPARISON:  Chest radiographs dated 10/02/2020. CTA chest dated 09/17/2020. FINDINGS: Cardiovascular: Satisfactory opacification of the bilateral pulmonary arteries to the lobar level. No evidence of pulmonary embolism. Although not tailored for evaluation of the thoracic aorta, there is no evidence of thoracic aortic aneurysm or dissection. Mild atherosclerotic calcifications of the aortic arch. Heart is normal in size.  No pericardial effusion. Mild coronary atherosclerosis of the LAD. Mediastinum/Nodes: No suspicious mediastinal lymphadenopathy. Visualized thyroid is unremarkable. Lungs/Pleura: Scarring/radiation changes at the lung apices. Patchy opacity in the medial right lower lobe (series 5/image 93), less rounded/masslike on the current study, continuing to favor infection/pneumonia. Associated small bilateral pleural effusions, unchanged on the right and new on the left. Associated mild compressive atelectasis in the left lower lobe. 14 mm right middle lobe nodule (series 6/image 82), previously 12 mm. 6 mm posterior right lower lobe nodule (series 6/image 54), previously 4 mm. These are suspicious for metastatic disease when correlating with  prior PET. Mild patchy/ground-glass opacities in the right upper lobe (series 6/image 68), suspicious for additional infection/pneumonia, new. Mild vague ground-glass opacity in the left lower lobe (series 6/image 88), equivocal. Debris in the trachea (series 6/image 31), likely reflecting secretions. No pneumothorax. Upper Abdomen: Visualized upper abdomen is grossly unremarkable. Musculoskeletal: Multifocal osseous metastases involving the left sternum, multiple ribs, multiple thoracic vertebral bodies, and the right proximal humerus, grossly unchanged. However, the soft tissue component along the left anterior 4th rib is more conspicuous on the current study, suggesting mild progression (series 5/image 84). Review of the MIP images confirms the above findings. IMPRESSION: No evidence of pulmonary embolism. Patchy right lower lobe opacity remains suspicious for pneumonia. Additional mild right upper lobe infection/pneumonia. Small bilateral pleural effusions, progressive on the left. Two right lung nodules are mildly progressive from recent CT, suspicious for metastatic disease. Mildly progressive soft tissue component  associated with left anterior 4th rib metastasis. Additional multifocal osseous metastases are unchanged. Aortic Atherosclerosis (ICD10-I70.0). Electronically Signed   By: Julian Hy M.D.   On: 10/03/2020 03:04        Scheduled Meds:  Chlorhexidine Gluconate Cloth  6 each Topical Daily   enoxaparin (LOVENOX) injection  40 mg Subcutaneous Q24H   feeding supplement  237 mL Oral BID BM   gabapentin  900 mg Oral TID   guaiFENesin  600 mg Oral BID   insulin aspart  0-9 Units Subcutaneous TID WC   lactose free nutrition  237 mL Oral TID BM   magic mouthwash  15 mL Oral TID   megestrol  800 mg Oral Daily   mirtazapine  30 mg Oral QHS   polyethylene glycol  17 g Oral Daily   Continuous Infusions:  azithromycin 500 mg (10/04/20 0840)   ceFEPime (MAXIPIME) IV 2 g (10/04/20 0600)      LOS: 1 day      [place], MD [facility]   To contact the attending provider between 7A-7P or the covering provider during after hours 7P-7A, please log into the web site [email or web address] and access using universal [place] password for that web site. If you do not have the password, please call the hospital operator.  10/04/2020, 1:11 PM

## 2020-10-04 NOTE — Progress Notes (Signed)
NAME:  Albert Flowers, MRN:  [id number], DOB:  17-Mar-1971, LOS: 1 ADMISSION DATE:  10/02/2020, CONSULTATION DATE:  10/03/2020 REFERRING MD:  Dr. Tamala Julian, CHIEF COMPLAINT:  Pulmonary Nodules with known metastatic disease   History of Present Illness:  Albert Flowers is a 50 y.o. 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   Of note patient was recently admitted and treated at this facility 7/2-7/8 for CAP of the RML for which he received cefepime and azithromycin and later discharged on Levaquin.  Patient was seen post discharge in the pulmonary clinic by Dr. Lamonte Sakai who reccommended repeat super D CT of the chest to evaluate for need of possible bronch.   Workup on arrival revealed leukopenia, thrombocytopenia, hyperglycemia without evidence of DKA or HHS, and mild hyponatremia. CTA chest was obtained and revealed RLL opacity and new mild right upper lobe infection. Patient was started on HCAP coverage and admitted to hospitilist service. PCCM consulted for additional care  Pertinent  Medical History  Metastatic squamous cell carcinoma of the tonsillar fossa ans base of tongue, COVID-19 infection June 2022 Type II diabetes  Significant Hospital Events:  7/18 admitted for chest pain, SOB, and lethargy   Interim History / Subjective:   Feeling better.  Cough is improving  Objective   Blood pressure 102/72, pulse 96, temperature 97.7 F (36.5 C), resp. rate 17, height 5\' 8"  (1.727 m), weight 63.6 kg, SpO2 99 %.        Intake/Output Summary (Last 24 hours) at 10/04/2020 1608 Last data filed at 10/04/2020 0500 Gross per 24 hour  Intake 369.84 ml  Output --  Net 369.84 ml   Filed Weights   10/02/20 2019 10/02/20 2302 10/03/20 0616   Weight: 65 kg 61.2 kg 63.6 kg    Examination: Blood pressure 102/72, pulse 96, temperature 97.7 F (36.5 C), resp. rate 17, height 5\' 8"  (1.727 m), weight 63.6 kg, SpO2 99 %. 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 Lamonte Sakai who has a plan for super D CT and possible navigational bronchoscopy as an outpatient in early August. Will keep this as scheduled for now.  No need for emergent procedure now as he is  recovering from pneumonia.  Continue antibiotic treatment, pulmonary hygiene Aspiration precautions We will follow-up as an outpatient after scan and appointments have already been made.  I have updated patient and his wife today at bedside  PCCM will be available as needed.  Please call with any questions  Best Practice    Diet/type: Regular consistency (see orders) DVT prophylaxis: LMWH GI prophylaxis: N/A Lines: Port a cath, yes and it is still needed Foley:  N/A Code Status:  full code Last date of multidisciplinary goals of care  discussion: per primary   Critical care time: N/A   Marshell Garfinkel MD [facility] See Amion for pager  If no response to pager , please call [phone] until 7pm After 7:00 pm call Elink  [phone] 10/04/2020, 4:08 PM

## 2020-10-05 DIAGNOSIS — E119 Type 2 diabetes mellitus without complications: Secondary | ICD-10-CM | POA: Diagnosis not present

## 2020-10-05 DIAGNOSIS — E43 Unspecified severe protein-calorie malnutrition: Secondary | ICD-10-CM | POA: Diagnosis not present

## 2020-10-05 DIAGNOSIS — J189 Pneumonia, unspecified organism: Secondary | ICD-10-CM | POA: Diagnosis not present

## 2020-10-05 LAB — CBC
HCT: 23.4 % — ABNORMAL LOW (ref 39.0–52.0)
Hemoglobin: 7.6 g/dL — ABNORMAL LOW (ref 13.0–17.0)
MCH: 31.8 pg (ref 26.0–34.0)
MCHC: 32.5 g/dL (ref 30.0–36.0)
MCV: 97.9 fL (ref 80.0–100.0)
Platelets: 135 10*3/uL — ABNORMAL LOW (ref 150–400)
RBC: 2.39 MIL/uL — ABNORMAL LOW (ref 4.22–5.81)
RDW: 17.9 % — ABNORMAL HIGH (ref 11.5–15.5)
WBC: 3.1 10*3/uL — ABNORMAL LOW (ref 4.0–10.5)
nRBC: 0 % (ref 0.0–0.2)

## 2020-10-05 LAB — COMPREHENSIVE METABOLIC PANEL
ALT: 20 U/L (ref 0–44)
AST: 17 U/L (ref 15–41)
Albumin: 2.5 g/dL — ABNORMAL LOW (ref 3.5–5.0)
Alkaline Phosphatase: 117 U/L (ref 38–126)
Anion gap: 6 (ref 5–15)
BUN: 7 mg/dL (ref 6–20)
CO2: 25 mmol/L (ref 22–32)
Calcium: 8.8 mg/dL — ABNORMAL LOW (ref 8.9–10.3)
Chloride: 104 mmol/L (ref 98–111)
Creatinine, Ser: 0.5 mg/dL — ABNORMAL LOW (ref 0.61–1.24)
GFR, Estimated: >60 mL/min (ref >60)
Glucose, Bld: 229 mg/dL — ABNORMAL HIGH (ref 70–99)
Potassium: 4 mmol/L (ref 3.5–5.1)
Sodium: 135 mmol/L (ref 135–145)
Total Bilirubin: 0.4 mg/dL (ref 0.3–1.2)
Total Protein: 6 g/dL — ABNORMAL LOW (ref 6.5–8.1)

## 2020-10-05 LAB — GLUCOSE, CAPILLARY
Glucose-Capillary: 191 mg/dL — ABNORMAL HIGH (ref 70–99)
Glucose-Capillary: 234 mg/dL — ABNORMAL HIGH (ref 70–99)
Glucose-Capillary: 117 mg/dL — ABNORMAL HIGH (ref 70–99)
Glucose-Capillary: 185 mg/dL — ABNORMAL HIGH (ref 70–99)

## 2020-10-05 LAB — HEMOGLOBIN A1C
Hgb A1c MFr Bld: 8.5 % — ABNORMAL HIGH (ref 4.8–5.6)
Mean Plasma Glucose: 197.25 mg/dL

## 2020-10-05 MED ORDER — INSULIN GLARGINE 100 UNIT/ML ~~LOC~~ SOLN
12.0000 [IU] | Freq: Every day | SUBCUTANEOUS | Status: DC
  Administered 2020-10-05 – 2020-10-06 (×2): 12 [IU] via SUBCUTANEOUS
  Filled 2020-10-05 (×2): qty 0.12

## 2020-10-05 MED ORDER — INSULIN ASPART 100 UNIT/ML IJ SOLN
0.0000 [IU] | Freq: Every day | INTRAMUSCULAR | Status: DC
  Administered 2020-10-05: 0 [IU] via SUBCUTANEOUS

## 2020-10-05 MED ORDER — INSULIN ASPART 100 UNIT/ML IJ SOLN
0.0000 [IU] | Freq: Three times a day (TID) | INTRAMUSCULAR | Status: DC
  Administered 2020-10-05: 5 [IU] via SUBCUTANEOUS
  Administered 2020-10-06: 2 [IU] via SUBCUTANEOUS
  Administered 2020-10-06: 8 [IU] via SUBCUTANEOUS
  Administered 2020-10-06: 5 [IU] via SUBCUTANEOUS

## 2020-10-05 NOTE — Progress Notes (Addendum)
PROGRESS NOTE    Albert Flowers  IRW:[id number] DOB: [birth year] DOA: 10/02/2020 PCP: [name], MD    Chief Complaint  Patient presents with   Chest Pain    Brief Narrative:   Albert Flowers is a 50 y.o. 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 -Patient presents to ED with complaints of chest pain, findings significant for worsening pneumonia despite being on appropriate antibiotics, as well as significant for pulmonary nodule, he is admitted for further work-up.  Assessment & Plan:   Principal Problem:   HCAP (healthcare-associated pneumonia) Active Problems:   Malignant neoplasm of base of tongue (HCC)   Pancytopenia (HCC)   Hyponatremia   Protein calorie malnutrition (HCC)   Diabetes mellitus without complication (HCC)   SIRS (systemic inflammatory response syndrome) (HCC)   History of COVID-19   Protein-calorie malnutrition, severe   healthcare-associated pneumonia:  -CTA of the chest concerning for right lower lobe upper lobe infection for possible pneumonia.   -Aspiration precautions with elevation of head of the bed -Continue with IV cefepime and azithromycin -Follow-up blood and sputum cultures(unable to produce phlegm yet), Legionella and strep pneumonia urine antigen(D/W staff to send). -I have discussed with the patient, he was encouraged to use incentive spirometry, and flutter valve.   Right lung nodule -PCCM input greatly appreciated, likely will need bronchoscopy, he is followed by Dr. 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 Chryl Heck  Diabetes Mellitus type 2, with hyperglycemia:  -A1c, CBG uncontrolled, CBG remains elevated, mainly due to diet noncompliance as he is having some donuts at bedside, he was counseled about diet, will increase Lantus to 12 units, will change sliding scale to resistant.  Hyponatremia:  -Improving, continue with IV fluids   Pancytopenia: Chronic.  Likely due to malignancy and chemotherapy   Recent history of COVID-19 infection: Patient had been treated for COVID-19 with remdesivir in June.  Repeat COVID-19 screening today was negative.   Severe protein calorie malnutrition: Chronic.  Albumin low at 2.4. -Continue Ensure shakes with   DVT prophylaxis: Lovenox Code Status: Full Family Communication: wife at bedside Disposition:   Status is: Inpatient  Remains inpatient appropriate because:IV treatments appropriate due to intensity of illness or inability to take PO  Dispo: The patient is from: Home              Anticipated d/c is to: Home              Patient currently is not medically stable to d/c.   Difficult to place patient No       Consultants:  PCCM   Subjective:  Patient report cough, congestion, reports unable to produce phlegm, denies any fever or chills.  Objective: Vitals:   10/03/20 1908 10/04/20 0327 10/04/20 1321 10/04/20 2013  BP: 99/67 100/62 102/72 105/71  Pulse: 82 79 96 97  Resp: 18 18 17 18   Temp: 98.2 F (36.8 C) 98 F (36.7 C) 97.7 F (36.5 C) 99.2 F (37.3 C)  TempSrc:      SpO2: 98% 98% 99% 100%  Weight:      Height:        Intake/Output Summary (Last 24 hours) at 10/05/2020 1147 Last data filed at 10/05/2020 0300 Gross per 24 hour  Intake 551.02 ml  Output --  Net 551.02 ml   Filed Weights   10/02/20 2019 10/02/20 2302 10/03/20 0616  Weight: 65 kg 61.2 kg 63.6 kg    Examination:  Awake Alert, Oriented X 3, frail, thin  appearing, no new F.N deficits, Normal affect Symmetrical Chest wall movement, Good air movement bilaterally, CTAB RRR,No Gallops,Rubs or new Murmurs, No Parasternal Heave +ve B.Sounds, Abd Soft, No tenderness, No rebound - guarding or rigidity. No Cyanosis, Clubbing or edema, No new Rash or bruise      Data Reviewed: I have personally reviewed following labs and imaging studies  CBC: Recent Labs  Lab 10/02/20 2019 10/03/20 0848 10/05/20 0947  WBC 3.9* 2.9* 3.1*  NEUTROABS  --  2.0  --   HGB 9.4* 8.7* 7.6*  HCT 29.1* 26.1* 23.4*  MCV 98.3 97.4 97.9  PLT 125* 128* 135*    Basic Metabolic Panel: Recent Labs  Lab 10/02/20 2019 10/03/20 0848 10/05/20 0947  NA 130* 133* 135  K 3.9 4.2 4.0  CL 96* 102 104  CO2 24 24 25   GLUCOSE 302* 114* 229*  BUN 15 11 7   CREATININE 0.55* 0.45* 0.50*  CALCIUM 8.7* 8.8* 8.8*    GFR: Estimated Creatinine Clearance: 98.3 mL/min (A) (by C-G formula based on SCr of 0.5 mg/dL (L)).  Liver Function Tests: Recent Labs  Lab 10/03/20 0848 10/05/20 0947  AST 21 17  ALT 21 20  ALKPHOS 110 117  BILITOT 0.3 0.4  PROT 6.1* 6.0*  ALBUMIN 2.4* 2.5*    CBG: Recent Labs  Lab 10/04/20 0759 10/04/20 1148 10/04/20 1612 10/04/20 2014 10/05/20 0810  GLUCAP 215* 210* 232* 298* 191*     Recent Results (from the past 240 hour(s))  SARS CORONAVIRUS 2 (TAT 6-24 HRS) Nasopharyngeal Nasopharyngeal Swab     Status: None   Collection Time: 10/03/20  4:52 AM   Specimen: Nasopharyngeal Swab  Result Value Ref Range Status   SARS Coronavirus 2 NEGATIVE NEGATIVE Final    Comment: (NOTE) SARS-CoV-2 target nucleic acids are NOT DETECTED.  The SARS-CoV-2 RNA is generally detectable in upper and lower respiratory specimens during the acute phase of infection. Negative results do not preclude SARS-CoV-2 infection, do not rule out co-infections with other pathogens, and should not be used as the sole basis for treatment or other patient management  decisions. Negative results must be combined with clinical observations, patient history, and epidemiological information. The expected result is Negative.  Fact Sheet for Patients: SugarRoll.be  Fact Sheet for Healthcare Providers: https://www.woods-mathews.com/  This test is not yet approved or cleared by the [place] FDA and  has been authorized for detection and/or diagnosis of SARS-CoV-2 by FDA under an Emergency Use Authorization (EUA). This EUA will remain  in effect (meaning this test can be used) for the duration of the COVID-19 declaration under Se ction 564(b)(1) of the Act, 21 U.S.C. section 360bbb-3(b)(1), unless the authorization is terminated or revoked sooner.  Performed at [facility], [place] [address], [place], [place] [address]   Culture, blood (routine x 2) Call MD if unable to obtain prior to antibiotics being given     Status: None (Preliminary result)   Collection Time: 10/03/20  8:48 AM   Specimen: BLOOD  Result Value Ref Range Status  Specimen Description BLOOD SITE NOT SPECIFIED  Final   Special Requests   Final    BOTTLES DRAWN AEROBIC ONLY Blood Culture adequate volume   Culture   Final    NO GROWTH 2 DAYS Performed at [facility], [address] [place], [place] [address]    Report Status PENDING  Incomplete  Culture, blood (Routine X 2) w Reflex to ID Panel     Status: None (Preliminary result)   Collection Time: 10/03/20  8:48 AM   Specimen: BLOOD  Result Value Ref Range Status   Specimen Description BLOOD SITE NOT SPECIFIED  Final   Special Requests   Final    BOTTLES DRAWN AEROBIC ONLY Blood Culture adequate volume   Culture   Final    NO GROWTH 2 DAYS Performed at [facility], [address], [place], [place] [address]    Report Status PENDING  Incomplete         Radiology Studies: No results found.      Scheduled Meds:  Chlorhexidine Gluconate Cloth  6 each  Topical Daily   enoxaparin (LOVENOX) injection  40 mg Subcutaneous Q24H   feeding supplement  237 mL Oral TID BM   gabapentin  900 mg Oral TID   guaiFENesin  600 mg Oral BID   insulin aspart  0-15 Units Subcutaneous TID WC   insulin aspart  0-5 Units Subcutaneous QHS   insulin glargine  12 Units Subcutaneous Daily   magic mouthwash  15 mL Oral TID   megestrol  800 mg Oral Daily   mirtazapine  30 mg Oral QHS   multivitamin with minerals  1 tablet Oral Daily   polyethylene glycol  17 g Oral Daily   Continuous Infusions:  sodium chloride 50 mL/hr at 10/04/20 1351   azithromycin 500 mg (10/05/20 1028)   ceFEPime (MAXIPIME) IV 2 g (10/05/20 0445)     LOS: 2 days      Gafford Climes, MD [facility]   To contact the attending provider between 7A-7P or the covering provider during after hours 7P-7A, please log into the web site [email or web address] and access using universal [place] password for that web site. If you do not have the password, please call the hospital operator.  10/05/2020, 11:47 AM

## 2020-10-06 DIAGNOSIS — J189 Pneumonia, unspecified organism: Secondary | ICD-10-CM | POA: Diagnosis not present

## 2020-10-06 LAB — CBC
HCT: 24.5 % — ABNORMAL LOW (ref 39.0–52.0)
Hemoglobin: 8 g/dL — ABNORMAL LOW (ref 13.0–17.0)
MCH: 32.3 pg (ref 26.0–34.0)
MCHC: 32.7 g/dL (ref 30.0–36.0)
MCV: 98.8 fL (ref 80.0–100.0)
Platelets: 120 10*3/uL — ABNORMAL LOW (ref 150–400)
RBC: 2.48 MIL/uL — ABNORMAL LOW (ref 4.22–5.81)
RDW: 17.8 % — ABNORMAL HIGH (ref 11.5–15.5)
WBC: 3.3 10*3/uL — ABNORMAL LOW (ref 4.0–10.5)
nRBC: 0 % (ref 0.0–0.2)

## 2020-10-06 LAB — BASIC METABOLIC PANEL
Anion gap: 8 (ref 5–15)
BUN: 7 mg/dL (ref 6–20)
CO2: 24 mmol/L (ref 22–32)
Calcium: 8.7 mg/dL — ABNORMAL LOW (ref 8.9–10.3)
Chloride: 103 mmol/L (ref 98–111)
Creatinine, Ser: 0.48 mg/dL — ABNORMAL LOW (ref 0.61–1.24)
GFR, Estimated: >60 mL/min (ref >60)
Glucose, Bld: 217 mg/dL — ABNORMAL HIGH (ref 70–99)
Potassium: 4.3 mmol/L (ref 3.5–5.1)
Sodium: 135 mmol/L (ref 135–145)

## 2020-10-06 LAB — GLUCOSE, CAPILLARY
Glucose-Capillary: 142 mg/dL — ABNORMAL HIGH (ref 70–99)
Glucose-Capillary: 248 mg/dL — ABNORMAL HIGH (ref 70–99)
Glucose-Capillary: 286 mg/dL — ABNORMAL HIGH (ref 70–99)
Glucose-Capillary: 157 mg/dL — ABNORMAL HIGH (ref 70–99)

## 2020-10-06 MED ORDER — GLIPIZIDE 5 MG PO TABS
2.5000 mg | ORAL_TABLET | Freq: Every day | ORAL | Status: DC
  Administered 2020-10-07: 2.5 mg via ORAL
  Filled 2020-10-06 (×2): qty 1

## 2020-10-06 MED ORDER — METFORMIN HCL 500 MG PO TABS
500.0000 mg | ORAL_TABLET | Freq: Two times a day (BID) | ORAL | Status: DC
  Administered 2020-10-06 – 2020-10-07 (×2): 500 mg via ORAL
  Filled 2020-10-06 (×2): qty 1

## 2020-10-06 NOTE — Plan of Care (Signed)
  Problem: Education: Goal: Knowledge of General Education information will improve Description: Including pain rating scale, medication(s)/side effects and non-pharmacologic comfort measures Outcome: Progressing   Problem: Clinical Measurements: Goal: Cardiovascular complication will be avoided Outcome: Progressing   Problem: Nutrition: Goal: Adequate nutrition will be maintained Outcome: Progressing   Problem: Elimination: Goal: Will not experience complications related to bowel motility Outcome: Progressing   Problem: Elimination: Goal: Will not experience complications related to urinary retention Outcome: Progressing   Problem: Pain Managment: Goal: General experience of comfort will improve Outcome: Progressing

## 2020-10-06 NOTE — Progress Notes (Addendum)
PROGRESS NOTE    Albert Flowers  NTI:[id number] DOB: [birth year] DOA: 10/02/2020 PCP: [name], MD    Chief Complaint  Patient presents with   Chest Pain    Brief Narrative:   Albert Flowers is a 50 y.o. 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 -Patient presents to ED with complaints of chest pain, findings significant for worsening pneumonia despite being on appropriate antibiotics, as well as significant for pulmonary nodule, he is admitted for further work-up.  10/06/2020: Seen and examined with his wife at his bedside.  He has been able to tolerate a diet.  He is currently on regular diet with hyperglycemia.  As hemoglobin A1c 8.5 on 10/05/2020.  Patient has had difficulty affording insulin in the past.  He started metformin and glipizide, monitor for tolerance.  Diabetes coordinator consulted.  Assessment & Plan:   Principal Problem:   HCAP (healthcare-associated pneumonia) Active Problems:   Malignant neoplasm of base of tongue (HCC)   Pancytopenia (HCC)   Hyponatremia   Protein calorie malnutrition (HCC)   Diabetes mellitus without complication (HCC)   SIRS (systemic inflammatory response syndrome) (HCC)   History of COVID-19   Protein-calorie malnutrition, severe   healthcare-associated pneumonia, improving:  -CTA of the chest concerning for right lower lobe upper lobe infection for possible pneumonia.   -Aspiration precautions with elevation of head of the bed -Continue with IV cefepime and azithromycin -Follow-up blood and sputum cultures(unable to produce phlegm yet), Legionella and strep pneumonia urine antigen(D/W staff to send). -I have discussed with the patient, he was encouraged to use incentive  spirometry, and flutter valve. Blood cultures negative to date  Type 2 diabetes with hyperglycemia Last hemoglobin A1c 8.5 on 10/05/2020. Has had difficulty affording insulin in the past. Started metformin 500 mg twice daily and glipizide 2.5 mg daily. DC subcu subcu Lantus 12 unit for now. Diabetes coordinator consulted.  Chemotherapy-induced pancytopenia Stable, monitor for now Recommend close follow-up with hematology/oncology outpatient after discharge.   Right lung nodule -PCCM input greatly appreciated, likely will need bronchoscopy, he is followed by Dr. 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 Chryl Heck  Resolved hypovolemic hyponatremia:  -He is currently on normal saline at 50 cc/h.   Pancytopenia: Chronic.  Likely due to malignancy and chemotherapy   Recent history of COVID-19 infection: Patient had been treated for COVID-19 with remdesivir in June.  Repeat COVID-19 screening today was negative.  Asymptomatic.   Severe protein calorie malnutrition: Chronic.  Albumin low at 2.4. -Continue Ensure shakes with Megace as an appetite stimulant.  Diet changed to regular.   DVT prophylaxis: Lovenox subcu daily Code Status: Full Family Communication: wife at bedside Disposition:   Status is: Inpatient  Remains inpatient appropriate because:IV treatments appropriate due to intensity of illness or inability to take PO  Dispo: The patient is from: Home              Anticipated d/c is to: Home, possibly on 10/07/2020.  Patient currently is not medically stable to d/c.   Difficult to place patient No       Consultants:  PCCM   Subjective:  Patient report cough, congestion, reports unable to  produce phlegm, denies any fever or chills.  Objective: Vitals:   10/04/20 1321 10/04/20 2013 10/05/20 1948 10/06/20 0607  BP: 102/72 105/71 102/71 103/74  Pulse: 96 97 87 85  Resp: 17 18 19 18   Temp: 97.7 F (36.5 C) 99.2 F (37.3 C) 98.3 F (36.8 C) 97.6 F (36.4 C)  TempSrc:    Oral  SpO2: 99% 100% 99% 96%  Weight:      Height:        Intake/Output Summary (Last 24 hours) at 10/06/2020 0722 Last data filed at 10/06/2020 9417 Gross per 24 hour  Intake 480 ml  Output 800 ml  Net -320 ml   Filed Weights   10/02/20 2019 10/02/20 2302 10/03/20 0616  Weight: 65 kg 61.2 kg 63.6 kg    Examination:  General: Awake alert oriented x3.  Frail thin appearing. Respiratory: Symmetrical chest wall movement.  Good air movement bilaterally clear to auscultation bilaterally.  No rales or wheezing noted.  Good inspiratory effort.   Cardiac: Regular rate and rhythm no rubs or gallops.  No JVD or thyromegaly noted.   GI: Nondistended nontender, bowel sounds present.  No Cyanosis, Clubbing or edema, No new Rash or bruise   Musculoskeletal: No edema, good pulses all 4 extremities. Skin: No rashes    Data Reviewed: I have personally reviewed following labs and imaging studies  CBC: Recent Labs  Lab 10/02/20 2019 10/03/20 0848 10/05/20 0947 10/06/20 0417  WBC 3.9* 2.9* 3.1* 3.3*  NEUTROABS  --  2.0  --   --   HGB 9.4* 8.7* 7.6* 8.0*  HCT 29.1* 26.1* 23.4* 24.5*  MCV 98.3 97.4 97.9 98.8  PLT 125* 128* 135* 120*    Basic Metabolic Panel: Recent Labs  Lab 10/02/20 2019 10/03/20 0848 10/05/20 0947 10/06/20 0417  NA 130* 133* 135 135  K 3.9 4.2 4.0 4.3  CL 96* 102 104 103  CO2 24 24 25 24   GLUCOSE 302* 114* 229* 217*  BUN 15 11 7 7   CREATININE 0.55* 0.45* 0.50* 0.48*  CALCIUM 8.7* 8.8* 8.8* 8.7*    GFR: Estimated Creatinine Clearance: 98.3 mL/min (A) (by C-G formula based on SCr of 0.48 mg/dL (L)).  Liver Function Tests: Recent Labs  Lab 10/03/20 0848  10/05/20 0947  AST 21 17  ALT 21 20  ALKPHOS 110 117  BILITOT 0.3 0.4  PROT 6.1* 6.0*  ALBUMIN 2.4* 2.5*    CBG: Recent Labs  Lab 10/04/20 2014 10/05/20 0810 10/05/20 1157 10/05/20 1620 10/05/20 1917  GLUCAP 298* 191* 234* 117* 185*     Recent Results (from the past 240 hour(s))  SARS CORONAVIRUS 2 (TAT 6-24 HRS) Nasopharyngeal Nasopharyngeal Swab     Status: None   Collection Time: 10/03/20  4:52 AM   Specimen: Nasopharyngeal Swab  Result Value Ref Range Status   SARS Coronavirus 2 NEGATIVE NEGATIVE Final    Comment: (NOTE) SARS-CoV-2 target nucleic acids are NOT DETECTED.  The SARS-CoV-2 RNA is generally detectable in upper and lower respiratory specimens during the acute phase of infection. Negative results do not preclude SARS-CoV-2 infection, do not rule out co-infections with other pathogens, and should not be used as the sole basis for treatment or other patient management decisions. Negative results must be combined with clinical observations, patient history,  and epidemiological information. The expected result is Negative.  Fact Sheet for Patients: SugarRoll.be  Fact Sheet for Healthcare Providers: https://www.woods-mathews.com/  This test is not yet approved or cleared by the [place] FDA and  has been authorized for detection and/or diagnosis of SARS-CoV-2 by FDA under an Emergency Use Authorization (EUA). This EUA will remain  in effect (meaning this test can be used) for the duration of the COVID-19 declaration under Se ction 564(b)(1) of the Act, 21 U.S.C. section 360bbb-3(b)(1), unless the authorization is terminated or revoked sooner.  Performed at [facility], [place] [address] [place], [place] [address]   Culture, blood (routine x 2) Call MD if unable to obtain prior to antibiotics being given     Status: None (Preliminary result)   Collection Time: 10/03/20  8:48 AM   Specimen: BLOOD   Result Value Ref Range Status   Specimen Description BLOOD SITE NOT SPECIFIED  Final   Special Requests   Final    BOTTLES DRAWN AEROBIC ONLY Blood Culture adequate volume   Culture   Final    NO GROWTH 2 DAYS Performed at [facility], [address], [place], [place] [address]    Report Status PENDING  Incomplete  Culture, blood (Routine X 2) w Reflex to ID Panel     Status: None (Preliminary result)   Collection Time: 10/03/20  8:48 AM   Specimen: BLOOD  Result Value Ref Range Status   Specimen Description BLOOD SITE NOT SPECIFIED  Final   Special Requests   Final    BOTTLES DRAWN AEROBIC ONLY Blood Culture adequate volume   Culture   Final    NO GROWTH 2 DAYS Performed at [facility], [address], [place], [place] [address]    Report Status PENDING  Incomplete         Radiology Studies: No results found.      Scheduled Meds:  Chlorhexidine Gluconate Cloth  6 each Topical Daily   enoxaparin (LOVENOX) injection  40 mg Subcutaneous Q24H   feeding supplement  237 mL Oral TID BM   gabapentin  900 mg Oral TID   guaiFENesin  600 mg Oral BID   insulin aspart  0-15 Units Subcutaneous TID WC   insulin aspart  0-5 Units Subcutaneous QHS   insulin glargine  12 Units Subcutaneous Daily   magic mouthwash  15 mL Oral TID   megestrol  800 mg Oral Daily   mirtazapine  30 mg Oral QHS   multivitamin with minerals  1 tablet Oral Daily   polyethylene glycol  17 g Oral Daily   Continuous Infusions:  sodium chloride 50 mL/hr at 10/04/20 1351   azithromycin 500 mg (10/05/20 1028)   ceFEPime (MAXIPIME) IV 2 g (10/05/20 2045)     LOS: 3 days      Kayleen Memos, MD [facility]   To contact the attending provider between 7A-7P or the covering provider during after hours 7P-7A, please log into the web site [email or web address] and access using universal [place] password for that web site. If you do not have the password, please call the hospital  operator.  10/06/2020, 7:22 AM

## 2020-10-07 DIAGNOSIS — J189 Pneumonia, unspecified organism: Secondary | ICD-10-CM | POA: Diagnosis not present

## 2020-10-07 LAB — GLUCOSE, CAPILLARY: Glucose-Capillary: 97 mg/dL (ref 70–99)

## 2020-10-07 MED ORDER — ALBUTEROL SULFATE HFA 108 (90 BASE) MCG/ACT IN AERS: 2.0000 | INHALATION_SPRAY | Freq: Four times a day (QID) | RESPIRATORY_TRACT | 0 refills | Status: DC | PRN

## 2020-10-07 MED ORDER — HEPARIN SOD (PORK) LOCK FLUSH 100 UNIT/ML IV SOLN
500.0000 [IU] | INTRAVENOUS | Status: AC | PRN
  Administered 2020-10-07: 500 [IU]
  Filled 2020-10-07: qty 5

## 2020-10-07 MED ORDER — MEGESTROL ACETATE 400 MG/10ML PO SUSP: 800.0000 mg | Freq: Every day | ORAL | 0 refills | Status: DC

## 2020-10-07 MED ORDER — GLIPIZIDE 5 MG PO TABS: 2.5000 mg | ORAL_TABLET | Freq: Every day | ORAL | 0 refills | Status: DC

## 2020-10-07 MED ORDER — ADULT MULTIVITAMIN W/MINERALS CH: 1.0000 | ORAL_TABLET | Freq: Every day | ORAL | 0 refills | Status: DC

## 2020-10-07 MED ORDER — METFORMIN HCL 500 MG PO TABS: 500.0000 mg | ORAL_TABLET | Freq: Two times a day (BID) | ORAL | 0 refills | Status: DC

## 2020-10-07 NOTE — Plan of Care (Signed)

## 2020-10-07 NOTE — Discharge Summary (Signed)
Discharge Summary  Albert Flowers RWE:[id number] DOB: May 01, 1970  PCP: [name], MD  Admit date: 10/02/2020 Discharge date: 10/07/2020  Time spent: 35 minutes   Recommendations for Outpatient Follow-up:  Follow-up with hematology oncology Follow-up with your PCP  Discharge Diagnoses:  Active Hospital Problems   Diagnosis Date Noted   HCAP (healthcare-associated pneumonia) 10/03/2020   Protein-calorie malnutrition, severe 10/05/2020   SIRS (systemic inflammatory response syndrome) ([place]) 10/03/2020   History of COVID-19 10/03/2020   Protein calorie malnutrition ([place]) 08/29/2020   Pancytopenia ([place]) 08/29/2020   Hyponatremia 08/29/2020   Diabetes mellitus without complication ([place])    Malignant neoplasm of base of tongue ([place]) 06/02/2018    Resolved Hospital Problems  No resolved problems to display.    Discharge Condition: Stable  Diet recommendation: Resume previous diet  Vitals:   10/06/20 2024 10/07/20 0507  BP: 100/71 90/62  Pulse: 92 80  Resp: 16 18  Temp: 98 F (36.7 C) 98.2 F (36.8 C)  SpO2: 98% 100%    History of present illness:  Albert Flowers is a 50 y.o. 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 -Patient presents to ED with complaints of chest pain, findings significant for worsening pneumonia despite being on appropriate antibiotics, as well as significant for pulmonary nodule for which she was seen by pulmonary.  He completed course of IV antibiotics.  Hospitalization complicated by uncontrolled type 2 diabetes with hyperglycemia.  Was switched to metformin and glipizide with improvement of his hyperglycemia.  10/07/20: Patient was seen with his wife at his bedside.  He has no new complaint  he was eager to go home.  Advised to follow-up with pulmonary and his oncologist.  Patient and his wife understand and agree with the plan.  Hospital Course:  Principal Problem:   HCAP (healthcare-associated pneumonia) Active Problems:   Malignant neoplasm of base of tongue (H[facility])   Pancytopenia (H[facility])   Hyponatremia   Protein calorie malnutrition ([facility])   Diabetes mellitus without complication (H[facility])   SIRS (systemic inflammatory response syndrome) (H[facility])   History of COVID-19   Protein-calorie malnutrition, severe  healthcare-associated pneumonia, improving:  -CTA of the chest concerning for right lower lobe upper lobe infection  -Aspiration precautions with elevation of head of the bed -Completed 5 days of IV cefepime and azithromycin Blood cultures negative to date   Type 2 diabetes with hyperglycemia Last hemoglobin A1c 8.5 on 10/05/2020. Has had difficulty affording insulin in the past. Started metformin 500 mg twice daily and glipizide 2.5 mg daily. CBG 97 on 10/07/2020 at 0717 a.m. DC subcu subcu Lantus 12 unit. Follow-up with your PCP.   Chemotherapy-induced pancytopenia Recommend close follow-up with hematology/oncology outpatient after discharge.   Right lung nodule -PCCM input greatly appreciated, likely will need bronchoscopy, he is followed by Dr. Tobin Chad as an outpatient, with plan for super D CT possible navigational bronchoscopy as an outpatient in early August.  Patient and wife receptive and will follow-up.   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 Chryl Heck   Resolved hypovolemic hyponatremia: Continue to encourage increase in oral intake.   Pancytopenia: Chronic.  Likely due to malignancy and chemotherapy.  Follow-up  with hematology/oncology.   Recent history of COVID-19 infection: Patient had been treated for  COVID-19 with remdesivir in June.  Repeat COVID-19 screening was negative.  Albuterol as needed for shortness of breath or wheezing.   Severe protein calorie malnutrition: Chronic.  Albumin low at 2.4. -Continue Ensure shakes with Megace as an appetite stimulant.  Diet changed to regular.      Code Status: Full Family Communication: wife at bedside            Consultants:  PCCM      Discharge Exam: BP 90/62 (BP Location: Left Arm)   Pulse 80   Temp 98.2 F (36.8 C)   Resp 18   Ht $R'5\' 8"'bW$  (1.727 m)   Wt 63.6 kg   SpO2 100%   BMI 21.32 kg/m  General: 50 y.o. year-old male well developed well nourished in no acute distress.  Alert and oriented x3. Cardiovascular: Regular rate and rhythm with no rubs or gallops.  No thyromegaly or JVD noted.   Respiratory: Clear to auscultation with no wheezes or rales. Good inspiratory effort. Abdomen: Soft nontender nondistended with normal bowel sounds x4 quadrants. Musculoskeletal: No lower extremity edema. 2/4 pulses in all 4 extremities. Skin: No ulcerative lesions noted or rashes, Psychiatry: Mood is appropriate for condition and setting  Discharge Instructions You were cared for by a hospitalist during your hospital stay. If you have any questions about your discharge medications or the care you received while you were in the hospital after you are discharged, you can call the unit and asked to speak with the hospitalist on call if the hospitalist that took care of you is not available. Once you are discharged, your primary care physician will handle any further medical issues. Please note that NO REFILLS for any discharge medications will be authorized once you are discharged, as it is imperative that you return to your primary care physician (or establish a relationship with a primary care physician if you do not have one) for your aftercare needs so that they can reassess your need for medications and monitor your lab  values.   Allergies as of 10/07/2020       Reactions   Penicillins Other (See Comments)   Unknown reaction        Medication List     STOP taking these medications    dexamethasone 4 MG tablet Commonly known as: DECADRON       TAKE these medications    albuterol 108 (90 Base) MCG/ACT inhaler Commonly known as: VENTOLIN HFA Inhale 2 puffs into the lungs every 6 (six) hours as needed for wheezing or shortness of breath.   blood glucose meter kit and supplies Kit Dispense based on patient and insurance preference. Use up to four times daily as directed.   diphenhydrAMINE 25 MG tablet Commonly known as: BENADRYL Take 1 tablet (25 mg total) by mouth at bedtime.   gabapentin 300 MG capsule Commonly known as: NEURONTIN Take 900 mg by mouth 3 (three) times daily.   glipiZIDE 5 MG tablet Commonly known as: GLUCOTROL Take 0.5 tablets (2.5 mg total) by mouth daily before breakfast. Start taking on: October 08, 2020   Insulin Pen Needle 29G X 5MM Misc For sliding scale insulin injection   lactose free nutrition Liqd Take 237 mLs by mouth 3 (three) times daily between meals.   lidocaine-prilocaine cream Commonly known as: EMLA Apply to affected area once What changed:  how much to take how to take this when  to take this reasons to take this additional instructions   magic mouthwash Soln Take 15 mLs by mouth 3 (three) times daily.   megestrol 400 MG/10ML suspension Commonly known as: MEGACE Take 20 mLs (800 mg total) by mouth daily.   megestrol 625 MG/5ML suspension Commonly known as: MEGACE ES Take 5 mLs (625 mg total) by mouth daily.   metFORMIN 500 MG tablet Commonly known as: GLUCOPHAGE Take 1 tablet (500 mg total) by mouth 2 (two) times daily with a meal.   mirtazapine 15 MG tablet Commonly known as: REMERON Take 2 tablets (30 mg total) by mouth at bedtime.   multivitamin with minerals Tabs tablet Take 1 tablet by mouth daily.   ondansetron 8 MG  disintegrating tablet Commonly known as: Zofran ODT Take 1 tablet (8 mg total) by mouth every 8 (eight) hours as needed for nausea or vomiting.   polyethylene glycol 17 g packet Commonly known as: MIRALAX / GLYCOLAX Take 17 g by mouth daily.       Allergies  Allergen Reactions   Penicillins Other (See Comments)    Unknown reaction      The results of significant diagnostics from this hospitalization (including imaging, microbiology, ancillary and laboratory) are listed below for reference.    Significant Diagnostic Studies: DG Chest 2 View  Result Date: 10/02/2020 CLINICAL DATA:  Chest pain EXAM: CHEST - 2 VIEW COMPARISON:  09/27/2020 FINDINGS: The lungs are symmetrically well expanded. Small bilateral pleural effusions are present, new since prior examination. Medial right basilar consolidation is unchanged. No pneumothorax. Right internal jugular chest port is seen with its tip at the superior cavoatrial junction. Cardiac size within normal limits. Pulmonary vascularity is normal. No acute bone abnormality. IMPRESSION: Interval development of small bilateral pleural effusions. Stable right medial basilar consolidation. Electronically Signed   By: Fidela Salisbury MD   On: 10/02/2020 21:00   DG Chest 2 View  Result Date: 09/28/2020 CLINICAL DATA:  Pneumonia EXAM: CHEST - 2 VIEW COMPARISON:  09/21/2020 FINDINGS: Continued medial right lower lobe airspace opacity at the right base, unchanged since prior study. Heart is normal size. Left lung clear. No visible effusions. Right Port-A-Cath is unchanged. IMPRESSION: Continued right medial basilar masslike opacity, not significantly changed. Electronically Signed   By: Rolm Baptise M.D.   On: 09/28/2020 08:29   DG Chest 2 View  Result Date: 09/21/2020 CLINICAL DATA:  50 year old male with shortness of breath, weakness. History of head neck cancer. EXAM: CHEST - 2 VIEW COMPARISON:  09/17/2020 FINDINGS: Right chest wall Port-A-Cath in place  with catheter tip near the cavoatrial junction, unchanged. The mediastinal contours are within normal limits. No cardiomegaly. Similar appearing right lower lobe perihilar opacity. Interval development of streaky peripheral opacities in the right lung base. The left lung is clear. No pleural effusion or pneumothorax. Similar appearing sclerotic opacity in the proximal right humerus. IMPRESSION: 1. Similar appearing right lower lobe perihilar masslike opacification with development of right lower lobe peripheral subsegmental atelectasis. Query sequela of aspiration, persistent pneumonia, or pulmonary neoplasm. 2. 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 Electronically Signed   By: Ruthann Cancer MD   On: 09/21/2020 08:40   DG Chest 2 View  Result Date: 09/17/2020 CLINICAL DATA:  RIGHT chest pain with cough. History of head and neck cancer. EXAM: CHEST - 2 VIEW COMPARISON:  09/15/2020 PET-CT, 08/28/2020 chest radiograph and prior studies FINDINGS: RIGHT MEDIAL basilar opacity/consolidation appears increased from recent studies. Cardiomediastinal silhouette is unchanged. No pleural effusion or  pneumothorax identified. A RIGHT Port-A-Cath with tip overlying SUPERIOR cavoatrial junction is again noted. Sclerotic lesion involving anterior LEFT 4th rib is again noted. IMPRESSION: RIGHT MEDIAL basilar opacity/consolidation, increased from recent studies and may represent infection, aspiration and/or atelectasis. No other significant change. Electronically Signed   By: Margarette Canada M.D.   On: 09/17/2020 12:27   CT Angio Chest PE W and/or Wo Contrast  Result Date: 10/03/2020 CLINICAL DATA:  Left chest pain EXAM: CT ANGIOGRAPHY CHEST WITH CONTRAST TECHNIQUE: Multidetector CT imaging of the chest was performed using the standard protocol during bolus administration of intravenous contrast. Multiplanar CT image reconstructions and MIPs were obtained to evaluate the vascular anatomy. CONTRAST:  66mL  OMNIPAQUE IOHEXOL 350 MG/ML SOLN COMPARISON:  Chest radiographs dated 10/02/2020. CTA chest dated 09/17/2020. FINDINGS: Cardiovascular: Satisfactory opacification of the bilateral pulmonary arteries to the lobar level. No evidence of pulmonary embolism. Although not tailored for evaluation of the thoracic aorta, there is no evidence of thoracic aortic aneurysm or dissection. Mild atherosclerotic calcifications of the aortic arch. Heart is normal in size.  No pericardial effusion. Mild coronary atherosclerosis of the LAD. Mediastinum/Nodes: No suspicious mediastinal lymphadenopathy. Visualized thyroid is unremarkable. Lungs/Pleura: Scarring/radiation changes at the lung apices. Patchy opacity in the medial right lower lobe (series 5/image 93), less rounded/masslike on the current study, continuing to favor infection/pneumonia. Associated small bilateral pleural effusions, unchanged on the right and new on the left. Associated mild compressive atelectasis in the left lower lobe. 14 mm right middle lobe nodule (series 6/image 82), previously 12 mm. 6 mm posterior right lower lobe nodule (series 6/image 54), previously 4 mm. These are suspicious for metastatic disease when correlating with prior PET. Mild patchy/ground-glass opacities in the right upper lobe (series 6/image 68), suspicious for additional infection/pneumonia, new. Mild vague ground-glass opacity in the left lower lobe (series 6/image 88), equivocal. Debris in the trachea (series 6/image 31), likely reflecting secretions. No pneumothorax. Upper Abdomen: Visualized upper abdomen is grossly unremarkable. Musculoskeletal: Multifocal osseous metastases involving the left sternum, multiple ribs, multiple thoracic vertebral bodies, and the right proximal humerus, grossly unchanged. However, the soft tissue component along the left anterior 4th rib is more conspicuous on the current study, suggesting mild progression (series 5/image 84). Review of the MIP images  confirms the above findings. IMPRESSION: No evidence of pulmonary embolism. Patchy right lower lobe opacity remains suspicious for pneumonia. Additional mild right upper lobe infection/pneumonia. Small bilateral pleural effusions, progressive on the left. Two right lung nodules are mildly progressive from recent CT, suspicious for metastatic disease. Mildly progressive soft tissue component associated with left anterior 4th rib metastasis. Additional multifocal osseous metastases are unchanged. Aortic Atherosclerosis (ICD10-I70.0). Electronically Signed   By: Julian Hy M.D.   On: 10/03/2020 03:04   CT Angio Chest PE W and/or Wo Contrast  Result Date: 09/17/2020 CLINICAL DATA:  50 year old male with cough, shortness of breath and RIGHT chest pain. EXAM: CT ANGIOGRAPHY CHEST WITH CONTRAST TECHNIQUE: Multidetector CT imaging of the chest was performed using the standard protocol during bolus administration of intravenous contrast. Multiplanar CT image reconstructions and MIPs were obtained to evaluate the vascular anatomy. CONTRAST:  94mL OMNIPAQUE IOHEXOL 350 MG/ML SOLN COMPARISON:  09/15/2020 PET CT FINDINGS: Cardiovascular: Satisfactory opacification of the pulmonary arteries to the segmental level. No evidence of pulmonary embolism. Normal heart size. No pericardial effusion. Mediastinum/Nodes: No significant change. Enlarged RIGHT hilar lymph nodes are identified. Lungs/Pleura: Masslike consolidation within the RIGHT LOWER lobe now measures 10.3 x 5.1 cm, previously  2.7 x 2.5 cm on 09/15/2020 study. A 12 mm RIGHT middle lobe nodule is unchanged. A small RIGHT pleural effusion is now identified. No pneumothorax or LEFT pleural effusion. Upper Abdomen: No acute abnormality. Musculoskeletal: Sclerotic lesions within bilateral ribs, proximal RIGHT humerus, sternum and thoracic spine again noted Review of the MIP images confirms the above findings. IMPRESSION: 1. No evidence of pulmonary emboli. 2. Rapid  increase in large RIGHT LOWER lobe masslike consolidation since 09/15/2020 almost certainly related to infection/pneumonia given significant change over 2 days. Component of underlying malignancy is not excluded however. New small RIGHT pleural effusion. 3. Unchanged 12 mm RIGHT middle lobe nodule and sclerotic lesions within bilateral ribs, proximal RIGHT humerus, sternum and thoracic spine compatible with metastatic disease. Electronically Signed   By: Harmon Pier M.D.   On: 09/17/2020 17:02   NM PET Image Restag (PS) Skull Base To Thigh  Result Date: 09/16/2020 CLINICAL DATA:  Subsequent treatment strategy for head neck cancer. EXAM: NUCLEAR MEDICINE PET SKULL BASE TO THIGH TECHNIQUE: 6.89 mCi F-18 FDG was injected intravenously. Full-ring PET imaging was performed from the skull base to thigh after the radiotracer. CT data was obtained and used for attenuation correction and anatomic localization. Fasting blood glucose: 136 mg/dl COMPARISON:  PET-CT 02/93/6670 FINDINGS: Mediastinal blood pool activity: SUV max 2.25 Liver activity: SUV max NA NECK: No hypermetabolic lymph nodes in the neck. Incidental CT findings: none CHEST: Inferior to the RIGHT hilum in the medial aspect of the RIGHT lower lobe there is new hypermetabolic consolidation measuring 2.7 cm with SUV max equal 6.2 on image 90. There is newly enlarged hypermetabolic nodule in the RIGHT middle lobe measuring 12 mm (image 41/CT series 8) with SUV max equal 5.85 ( fused data set image 93). There is interval resolution in the previously seen patchy ground-glass nodularity in the RIGHT upper lobe. Decreased metabolic activity of RIGHT hilar lymph node. Incidental CT findings: none ABDOMEN/PELVIS: The large hypermetabolic lesion in the inferior RIGHT hepatic lobe is improved significantly with minimal residual metabolic activity (SUV max equal 3.6 decreased from SUV max equal 11.5). The lesion is measurably smaller measuring 20 mm (image 129 CT series  4) compared with 35 mm. No new hepatic lesions. Incidental CT findings: none SKELETON: Marked improvement skeletal metastasis. Hypermetabolic lesions in the sacrum and bilateral iliac bones have completely resolved. Metabolic activity at the L3 vertebral body has near completely resolved and now normal background level. Sclerotic bone lesion remaining. Likewise intense radiotracer activity was present previously in the RIGHT humeral head with SUV max equal 10.1 now reduced to SUV max equal 4.4. Sclerosis of the bone has increased. Incidental CT findings: none IMPRESSION: 1. Mixed response to therapy. 2. Marked improvement in skeletal metastasis with near resolution of metabolic activity of previous multifocal skeletal metastasis. Sclerotic lesions remain in the underlying bone. 3. Marked improvement in solitary hypermetabolic hepatic metastasis reduced in metabolic activity and size. 4. Two new lesions in the RIGHT lung which are hypermetabolic. One RIGHT upper lobe nodule and a new focus of hypermetabolic nodular consolidation in the RIGHT infrahilar lower lobe. 5. Improvement in ground-glass nodularity in the RIGHT upper lobe suggest resolved pulmonary infection. Electronically Signed   By: Genevive Bi M.D.   On: 09/16/2020 09:03    Microbiology: Recent Results (from the past 240 hour(s))  SARS CORONAVIRUS 2 (TAT 6-24 HRS) Nasopharyngeal Nasopharyngeal Swab     Status: None   Collection Time: 10/03/20  4:52 AM   Specimen: Nasopharyngeal Swab  Result Value Ref Range Status   SARS Coronavirus 2 NEGATIVE NEGATIVE Final    Comment: (NOTE) SARS-CoV-2 target nucleic acids are NOT DETECTED.  The SARS-CoV-2 RNA is generally detectable in upper and lower respiratory specimens during the acute phase of infection. Negative results do not preclude SARS-CoV-2 infection, do not rule out co-infections with other pathogens, and should not be used as the sole basis for treatment or other patient management  decisions. Negative results must be combined with clinical observations, patient history, and epidemiological information. The expected result is Negative.  Fact Sheet for Patients: SugarRoll.be  Fact Sheet for Healthcare Providers: https://www.woods-mathews.com/  This test is not yet approved or cleared by the [place] FDA and  has been authorized for detection and/or diagnosis of SARS-CoV-2 by FDA under an Emergency Use Authorization (EUA). This EUA will remain  in effect (meaning this test can be used) for the duration of the COVID-19 declaration under Se ction 564(b)(1) of the Act, 21 U.S.C. section 360bbb-3(b)(1), unless the authorization is terminated or revoked sooner.  Performed at [facility], [place] [address] [place], [place] [address]   Culture, blood (routine x 2) Call MD if unable to obtain prior to antibiotics being given     Status: None (Preliminary result)   Collection Time: 10/03/20  8:48 AM   Specimen: BLOOD  Result Value Ref Range Status   Specimen Description BLOOD SITE NOT SPECIFIED  Final   Special Requests   Final    BOTTLES DRAWN AEROBIC ONLY Blood Culture adequate volume   Culture   Final    NO GROWTH 4 DAYS Performed at [facility], [place] [address] [place], [place] [address]    Report Status PENDING  Incomplete  Culture, blood (Routine X 2) w Reflex to ID Panel     Status: None (Preliminary result)   Collection Time: 10/03/20  8:48 AM   Specimen: BLOOD  Result Value Ref Range Status   Specimen Description BLOOD SITE NOT SPECIFIED  Final   Special Requests   Final    BOTTLES DRAWN AEROBIC ONLY Blood Culture adequate volume   Culture   Final    NO GROWTH 4 DAYS Performed at [facility], [address], [place], [place] [address]    Report Status PENDING  Incomplete     Labs: Basic Metabolic Panel: Recent Labs  Lab 10/02/20 2019 10/03/20 0848 10/05/20 0947 10/06/20 0417  NA  130* 133* 135 135  K 3.9 4.2 4.0 4.3  CL 96* 102 104 103  CO2 $Re'24 24 25 24  'lLD$ GLUCOSE 302* 114* 229* 217*  BUN $Re'15 11 7 7  'Src$ CREATININE 0.55* 0.45* 0.50* 0.48*  CALCIUM 8.7* 8.8* 8.8* 8.7*   Liver Function Tests: Recent Labs  Lab 10/03/20 0848 10/05/20 0947  AST 21 17  ALT 21 20  ALKPHOS 110 117  BILITOT 0.3 0.4  PROT 6.1* 6.0*  ALBUMIN 2.4* 2.5*   No results for input(s): LIPASE, AMYLASE in the last 168 hours. No results for input(s): AMMONIA in the last 168 hours. CBC: Recent Labs  Lab 10/02/20 2019 10/03/20 0848 10/05/20 0947 10/06/20 0417  WBC 3.9* 2.9* 3.1* 3.3*  NEUTROABS  --  2.0  --   --   HGB 9.4* 8.7* 7.6* 8.0*  HCT 29.1* 26.1* 23.4* 24.5*  MCV 98.3 97.4 97.9 98.8  PLT 125* 128* 135* 120*   Cardiac Enzymes: No results for input(s): CKTOTAL, CKMB, CKMBINDEX, TROPONINI in the last 168 hours. BNP:  BNP (last 3 results) Recent Labs    10/03/20 0105  BNP 15.2    ProBNP (last 3 results) No results for input(s): PROBNP in the last 8760 hours.  CBG: Recent Labs  Lab 10/06/20 0730 10/06/20 1142 10/06/20 1636 10/06/20 2027 10/07/20 0717  GLUCAP 142* 248* 286* 157* 97       Signed:  Kayleen Memos, MD [facility] 10/07/2020, 6:56 PM

## 2020-10-07 NOTE — TOC Initial Note (Signed)
Transition of Care [facility]) - Initial/Assessment Note    Patient Details  Name: Albert Flowers MRN: [id number] Date of Birth: 1970-06-12  Transition of Care [facility]) CM/SW Contact:    Angelita Ingles, RN Phone Number:[phone]  10/07/2020, 10:33 AM  Clinical Narrative:                 [facility] consulted for [facility] needs. CM at bedside to offer patient choice. Patient and wife both agree that they only wanted [facility] nurse for vital sign assessment. Patient and wife agree that they feel comfortable in management of disease process and decline to have CM set up [facility] RN at this time. MD and bedside nurse made aware. No other needs noted at this time. TOC will sign off  Expected Discharge Plan: [place] Barriers to Discharge: No Barriers Identified   Patient Goals and CMS Choice Patient states their goals for this hospitalization and ongoing recovery are:: Ready to go home CMS Medicare.gov Compare Post Acute Care list provided to:: Patient Choice offered to / list presented to : Patient  Expected Discharge Plan and Services Expected Discharge Plan: [place] In-house Referral: NA Discharge Planning Services: CM Consult Post Acute Care Choice: [place] arrangements for the past 2 months: Single Family Home Expected Discharge Date: 10/07/20               DME Arranged: N/A DME Agency: NA       HH Arranged: Patient Refused [place] Agency: NA        Prior Living Arrangements/Services Living arrangements for the past 2 months: Single Family Home Lives with:: Spouse   Do you feel safe going back to the place where you live?: Yes      Need for Family Participation in Patient Care: Yes (Comment) Care giver support system in place?: Yes (comment) Current home services: Other (comment) (n/a) Criminal Activity/Legal Involvement Pertinent to Current Situation/Hospitalization: No - Comment as needed  Activities of Daily Living Home Assistive  Devices/Equipment: Cane (specify quad or straight), Eyeglasses ADL Screening (condition at time of admission) Patient's cognitive ability adequate to safely complete daily activities?: No Is the patient deaf or have difficulty hearing?: No Does the patient have difficulty seeing, even when wearing glasses/contacts?: No Does the patient have difficulty concentrating, remembering, or making decisions?: No Patient able to express need for assistance with ADLs?: Yes Does the patient have difficulty dressing or bathing?: No Independently performs ADLs?: Yes (appropriate for developmental age) Does the patient have difficulty walking or climbing stairs?: Yes Weakness of Legs: Both Weakness of Arms/Hands: None  Permission Sought/Granted   Permission granted to share information with : No              Emotional Assessment Appearance:: Appears stated age Attitude/Demeanor/Rapport: Gracious Affect (typically observed): Pleasant Orientation: : Oriented to Self, Oriented to Place, Oriented to  Time, Oriented to Situation Alcohol / Substance Use: Not Applicable Psych Involvement: No (comment)  Admission diagnosis:  HCAP (healthcare-associated pneumonia) [J18.9] Multifocal pneumonia [J18.9] Patient Active Problem List   Diagnosis Date Noted   Protein-calorie malnutrition, severe 10/05/2020   HCAP (healthcare-associated pneumonia) 10/03/2020   SIRS (systemic inflammatory response syndrome) ([place]) 10/03/2020   History of COVID-19 10/03/2020   Abnormal CT of the chest 09/27/2020   Community acquired pneumonia of right middle lobe of lung 09/17/2020   Sepsis ([place]) 08/29/2020   Pancytopenia ([place]) 08/29/2020   Nausea & vomiting 08/29/2020   Generalized weakness 08/29/2020  Hyponatremia 08/29/2020   Dehydration 08/29/2020   Protein calorie malnutrition ([place]) 08/29/2020   Diabetes mellitus without complication ([place])    [id number] virus infection 08/28/2020   Cough 08/24/2020   Cancer related  pain 08/09/2020   Mucositis due to chemotherapy 07/27/2020   Intractable hiccoughs 07/27/2020   Weight loss, unintentional 07/27/2020   Anorexia 07/12/2020   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 ([place]) 05/09/2020   Malignant neoplasm of tonsillar fossa ([place]) 06/01/2019   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   Head and neck cancer ([place]) 05/14/2018   PCP:  [name], MD Pharmacy:   [id number] [id number]: [phone]: [phone]    Social Determinants of Health (SDOH) Interventions    Readmission Risk Interventions Readmission Risk Prevention Plan 10/07/2020  Transportation Screening Complete  PCP or Specialist Appt within 3-5 Days Complete  [place] or [place]  Social Work Consult for [place]anning/Counseling Complete  Palliative Care Screening Complete  Medication Review (Press photographer Referral to Pharmacy  Some recent data might be hidden

## 2020-10-07 NOTE — Progress Notes (Signed)
AVS provided to pt. And reviewed. All questions answered at this time.

## 2020-10-08 LAB — CULTURE, BLOOD (ROUTINE X 2)
Culture: NO GROWTH
Culture: NO GROWTH
Special Requests: ADEQUATE
Special Requests: ADEQUATE

## 2020-10-11 ENCOUNTER — Telehealth: Payer: Self-pay | Admitting: Hematology and Oncology

## 2020-10-11 NOTE — Telephone Encounter (Signed)
Scheduled appt per 7/26 inbasket msg from MD. Called pt, no answer. Left msg with appt date and time.

## 2020-10-13 ENCOUNTER — Encounter: Payer: Self-pay | Admitting: Hematology and Oncology

## 2020-10-13 ENCOUNTER — Inpatient Hospital Stay (HOSPITAL_BASED_OUTPATIENT_CLINIC_OR_DEPARTMENT_OTHER): Payer: Managed Care, Other (non HMO) | Admitting: Hematology and Oncology

## 2020-10-13 ENCOUNTER — Inpatient Hospital Stay: Payer: Managed Care, Other (non HMO)

## 2020-10-13 ENCOUNTER — Other Ambulatory Visit: Payer: Self-pay

## 2020-10-13 DIAGNOSIS — C01 Malignant neoplasm of base of tongue: Secondary | ICD-10-CM | POA: Diagnosis present

## 2020-10-13 DIAGNOSIS — Z9221 Personal history of antineoplastic chemotherapy: Secondary | ICD-10-CM | POA: Diagnosis not present

## 2020-10-13 DIAGNOSIS — C7951 Secondary malignant neoplasm of bone: Secondary | ICD-10-CM | POA: Diagnosis not present

## 2020-10-13 DIAGNOSIS — E119 Type 2 diabetes mellitus without complications: Secondary | ICD-10-CM | POA: Diagnosis not present

## 2020-10-13 DIAGNOSIS — Z923 Personal history of irradiation: Secondary | ICD-10-CM | POA: Diagnosis not present

## 2020-10-13 DIAGNOSIS — Z8701 Personal history of pneumonia (recurrent): Secondary | ICD-10-CM | POA: Diagnosis not present

## 2020-10-13 DIAGNOSIS — J90 Pleural effusion, not elsewhere classified: Secondary | ICD-10-CM | POA: Diagnosis not present

## 2020-10-13 DIAGNOSIS — R0781 Pleurodynia: Secondary | ICD-10-CM | POA: Diagnosis not present

## 2020-10-13 DIAGNOSIS — M549 Dorsalgia, unspecified: Secondary | ICD-10-CM | POA: Diagnosis not present

## 2020-10-13 DIAGNOSIS — Z79899 Other long term (current) drug therapy: Secondary | ICD-10-CM | POA: Diagnosis not present

## 2020-10-13 DIAGNOSIS — Z794 Long term (current) use of insulin: Secondary | ICD-10-CM | POA: Diagnosis not present

## 2020-10-13 DIAGNOSIS — G893 Neoplasm related pain (acute) (chronic): Secondary | ICD-10-CM

## 2020-10-13 DIAGNOSIS — C090 Malignant neoplasm of tonsillar fossa: Secondary | ICD-10-CM

## 2020-10-13 DIAGNOSIS — R21 Rash and other nonspecific skin eruption: Secondary | ICD-10-CM | POA: Diagnosis not present

## 2020-10-13 LAB — CBC WITH DIFFERENTIAL (CANCER CENTER ONLY)
Abs Immature Granulocytes: 0.27 10*3/uL — ABNORMAL HIGH (ref 0.00–0.07)
Basophils Absolute: 0 10*3/uL (ref 0.0–0.1)
Basophils Relative: 1 %
Eosinophils Absolute: 0.1 10*3/uL (ref 0.0–0.5)
Eosinophils Relative: 1 %
HCT: 24.9 % — ABNORMAL LOW (ref 39.0–52.0)
Hemoglobin: 8.4 g/dL — ABNORMAL LOW (ref 13.0–17.0)
Immature Granulocytes: 7 %
Lymphocytes Relative: 11 %
Lymphs Abs: 0.5 10*3/uL — ABNORMAL LOW (ref 0.7–4.0)
MCH: 33.2 pg (ref 26.0–34.0)
MCHC: 33.7 g/dL (ref 30.0–36.0)
MCV: 98.4 fL (ref 80.0–100.0)
Monocytes Absolute: 0.6 10*3/uL (ref 0.1–1.0)
Monocytes Relative: 15 %
Neutro Abs: 2.6 10*3/uL (ref 1.7–7.7)
Neutrophils Relative %: 65 %
Platelet Count: 188 10*3/uL (ref 150–400)
RBC: 2.53 MIL/uL — ABNORMAL LOW (ref 4.22–5.81)
RDW: 19 % — ABNORMAL HIGH (ref 11.5–15.5)
WBC Count: 4 10*3/uL (ref 4.0–10.5)
nRBC: 0.7 % — ABNORMAL HIGH (ref 0.0–0.2)

## 2020-10-13 LAB — CMP (CANCER CENTER ONLY)
ALT: 25 U/L (ref 0–44)
AST: 18 U/L (ref 15–41)
Albumin: 3.3 g/dL — ABNORMAL LOW (ref 3.5–5.0)
Alkaline Phosphatase: 123 U/L (ref 38–126)
Anion gap: 8 (ref 5–15)
BUN: 13 mg/dL (ref 6–20)
CO2: 26 mmol/L (ref 22–32)
Calcium: 9.5 mg/dL (ref 8.9–10.3)
Chloride: 101 mmol/L (ref 98–111)
Creatinine: 0.73 mg/dL (ref 0.61–1.24)
GFR, Estimated: >60 mL/min (ref >60)
Glucose, Bld: 179 mg/dL — ABNORMAL HIGH (ref 70–99)
Potassium: 3.9 mmol/L (ref 3.5–5.1)
Sodium: 135 mmol/L (ref 135–145)
Total Bilirubin: <0.2 mg/dL — ABNORMAL LOW (ref 0.3–1.2)
Total Protein: 7.4 g/dL (ref 6.5–8.1)

## 2020-10-13 LAB — SAMPLE TO BLOOD BANK

## 2020-10-13 LAB — TSH: TSH: 5.424 u[IU]/mL — ABNORMAL HIGH (ref 0.320–4.118)

## 2020-10-13 MED ORDER — LIDOCAINE 5 % EX PTCH: 1.0000 | MEDICATED_PATCH | CUTANEOUS | 0 refills | Status: DC

## 2020-10-13 NOTE — Assessment & Plan Note (Signed)
He is reluctant to use any pain medication since his pain is intermittent and not as bothersome.  We will try lidocaine patches for the localized pain

## 2020-10-13 NOTE — Progress Notes (Signed)
[place] CONSULT NOTE  Patient Care Team: Pcp, No as PCP - General [name], MD as Consulting Physician (Otolaryngology) [name], MD as Attending Physician (Radiation Oncology) Leota Sauers, RN (Inactive) as Registered Nurse (Oncology) Wynelle Beckmann, Melodie Bouillon, PT as Physical Therapist (Physical Therapy) Sharen Counter, CCC-SLP as Speech Language Pathologist (Speech Pathology) Karie Mainland, RD as Dietitian (Nutrition) Malmfelt, Stephani Police, RN as Oncology Nurse Navigator (Oncology)  CHIEF COMPLAINTS/PURPOSE OF CONSULTATION:   Follow up after recent hospitalization for COVID  ASSESSMENT & PLAN:   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  Since his hospital discharge, he continues to feel better, gaining weight, cough has improved.  He complains of a tender spot in the intercostal space on the left which has been bothering him but otherwise denies any other complaints.  We will repeat labs today and if labs are satisfactory will resume chemotherapy next week with carboplatin and Keytruda.  He will return to clinic for follow-up in 3 weeks. He expressed understanding of the recommendations and he is willing to proceed with chemotherapy.  With regards to his 2 areas in the lung that showed progression, we have discussed about him in the head and neck tumor board and the radiation oncology team did not feel that this necessitates palliative radiation at this time.  So we will continue to monitor these areas  and on his next imaging.  Rib pain on left side He is reluctant to use any pain medication since his pain is intermittent and not as bothersome.  We will try lidocaine patches for the localized pain  Cancer related pain He takes gabapentin for the back pain, this is decently controlled according to the patient and has not been very bothersome.  He has not needed any hydrocodone for the back pain in a while.  No orders of the defined types were placed in this encounter.   HISTORY OF PRESENTING ILLNESS:   Albert Flowers 50 y.o. male is here because of metastatic HPV positive BOT cancer  Oncology History Overview Note   In summary, patient presents with Dr. Constance Holster of ENT in late 03/2018 for evaluation of an enlarging right neck mass.  CT neck showed necrotic right Level II and III necrotic LN's, possibly from oropharyngeal oral cavity primary, but the study was limited due to streak artifacts.  He underwent FNA of the R Level II LN, which showed squamous cell carcinoma, p16+.  PET in 05/2018 showed asymmetric FDG uptake in the right tonsil/base of the tongue, likely the primary malignancy.  In addition, there were two FDG-avid R Level II LN's without evidence of contralateral cervical LN or metastatic disease.  Given the relatively low volume disease, Dr Maylon Peppers recommended upfront surgery, followed by adjuvant therapy based on the final pathology. He had TORS on 06/16/2018.  Pathology from the procedure showed: invasive p16-positive (HPV-related) squamous cell carcinoma, 1.4 cm, negative margins. Out of 24 biopsied lymph nodes, only one was positive for metastatic carcinoma (1/24).  This node was 4.5cm but with no extracapsular extension.  No LVSI and no PNI.  He underwent right  neck dissection but not left neck dissection.  At postop follow up, they agreed to proceed with observation and reserve radiation therapy for any possible future recurrence.   At follow up on 04/07/2019, Dr. [name] noted a  new suspicious left upper neck lymph node. Neck CT performed that day revealed: new heterogeneous 2.2 cm contralateral left level 2a lymph node suspicious for contralateral nodal metastasis; indeterminate mild interval increase in size of several left level 2a and 2b lymph nodes.   PET scan performed on 04/16/2019 showed: single hypermetabolic left cervical lymph node; mild asymmetric FDG uptake in left glossotonsillar sulcus; focal FDG uptake in L1 vertebral body without CT correlate; otherwise, no malignant-range FDG uptake elsewhere.  He underwent left tonsillectomy and left neck dissection on 05/04/2019 with pathology revealing: benign left tonsil. Out of a total of 25 biopsied lymph nodes, only one was positive for metastatic carcinoma but negative for extracapsular extension and positive for p16.  He was advised adjuvant radiation at this time. He completed radiation on 07/24/2019. He was seen for FU by Dr Isidore Moos in December when he was doing well. He then started complaining of back pain in February. This led to MRI and PET imaging.  04/26/2020 MRI showed extensive metastatic disease throughout the lumbar spine as well as the sacrum and left iliac bone. No pathologic fracture or epidural Tumor.  05/04/2020 PET CT showed widespread hypermetabolic bone mets. 3.5 cm necrotic hypermetabolic met lesion in right liver. Hypermetabolic uptake in right hilum associated with 2 hypermetabolic lung nodules. Patchy/nodular ground-glass opacity in the right lung having a tree-in-bud configuration. This would be an atypical appearance for metastatic disease and infectious etiology is favored, potentially Atypical.  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   Head and neck cancer ([place])  04/24/2018 Imaging   CT neck w/ contrast: 1. Right level 2 necrotic nodal mass and level 3 necrotic lymph node are  highly concerning for nodal metastasis from head and neck primary neoplasm likely oropharynx or the oral cavity. However, evaluation for primary neoplasm in these regions is markedly limited due to streak artifacts from dental amalgam and closed airway possibly secondary to patient's holding breath/swallowing. Recommend direct visual inspection and possibly PET CT scan for further evaluation.  2. Mild asymmetry and fullness of the right nasopharyngeal region, nonspecific.  3. 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    05/07/2018 Procedure   FNA of the R IJ Level II LN     05/07/2018 Pathology Results   ACCESSION NUMBER: [id number]  Right IJ chain lymph node, FNA Squamous cell carcinoma, metastatic; p16+     05/28/2018 Imaging   PET: IMPRESSION: 1. Asymmetric hypermetabolic activity in the RIGHT lingular tonsil/base of tongue region favored primary carcinoma. 2. Two hypermetabolic RIGHT level II metastatic lymph nodes. 3. No LEFT cervical lymphadenopathy.  No distant metastatic disease.    Malignant neoplasm of base of tongue ([place])  04/24/2018 Imaging   CT neck w/ contrast: 1. Right level 2 necrotic nodal mass and level 3 necrotic lymph node are highly concerning for nodal metastasis from head and neck primary neoplasm likely oropharynx or the oral cavity. However, evaluation for primary neoplasm in these regions is markedly limited due to streak artifacts from dental amalgam and closed airway possibly secondary to patient's holding breath/swallowing. Recommend direct visual inspection and possibly PET CT scan for further evaluation.  2. Mild asymmetry  and fullness of the right nasopharyngeal region, nonspecific.  3. 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    05/07/2018 Procedure   FNA of the right cervical LN    05/07/2018 Pathology Results   ACCESSION NUMBER: [id number]  Right IJ chain lymph node, FNA Squamous cell carcinoma, metastatic; p16+      05/28/2018 Imaging   PET: IMPRESSION: 1. Asymmetric hypermetabolic activity in the RIGHT lingular tonsil/base of tongue region favored primary carcinoma. 2. Two hypermetabolic RIGHT level II metastatic lymph nodes. 3. No LEFT cervical lymphadenopathy.  No distant metastatic disease.    06/02/2018 Initial Diagnosis   Malignant neoplasm of base of tongue ([place])    06/02/2018 Cancer Staging   Staging form: Pharynx - HPV-Mediated Oropharynx, AJCC 8th Edition - Clinical: Stage I (cT2, cN1, cM0, p16+) - Signed by [name], MD on 06/02/2018    05/29/2019 Cancer Staging   Staging form: Pharynx - HPV-Mediated Oropharynx, AJCC 8th Edition - Pathologic stage from 05/29/2019: Stage I (rpT0, pN1, cM0, p16+) - Signed by [name], MD on 05/29/2019    06/27/2020 - 06/27/2020 Chemotherapy          07/18/2020 -  Chemotherapy    Patient is on Treatment Plan: HEAD/NECK PEMBROLIZUMAB + CARBOPLATIN + 5FU Q21D X 6 CYCLES / PEMBROLIZUMAB Q21D       Malignant neoplasm of tonsillar fossa ([facility])  06/27/2020 - 06/27/2020 Chemotherapy          07/18/2020 -  Chemotherapy    Patient is on Treatment Plan: HEAD/NECK PEMBROLIZUMAB + CARBOPLATIN + 5FU Q21D X 6 CYCLES / PEMBROLIZUMAB Q21D        Interval History  Since last visit here, he was hospitalized twice for pneumonia. He has been doing well since hospital discharge.  He complains of pain in his intercostal space on the left side, he refers to it as the fourth rib but spot he hurts appears to be a bit lower than the fourth rib. Other than this pain, he has been eating okay, gaining some weight.  He had a couple episodes of urinary incontinence.  Some mild skin rash on the dorsum of both hands, not bothersome.  Cough is getting better.  No more shortness of breath.  He is done with all his antibiotics.  He has a repeat CT scan to be done on August 12. No change in bowel habits or urinary habits. Rest of the pertinent 10 point ROS reviewed and  negative.  MEDICAL HISTORY:  Past Medical History:  Diagnosis Date   Diabetes mellitus without complication ([place])    Malignant neoplasm of base of tongue ([place]) 06/02/2018    SURGICAL HISTORY: Past Surgical History:  Procedure Laterality Date   IR IMAGING GUIDED PORT INSERTION  07/15/2020   left neck dissection Left 05/04/2019   Left Neck Dissection by Dr. [name] at [facility].    neck sugery     as a child "had a knot removed from neck" not sure which side.    RIght neck dissection Right 06/16/2018   TORS and right neck dissection. Dr. [name] [facility]    SOCIAL HISTORY: Social History   Socioeconomic History   Marital status: Married    Spouse name: Not on file   Number of children: 4   Years of education: Not on file   Highest education level: Not on file  Occupational History   Not on file  Tobacco Use   Smoking status: Never   Smokeless tobacco: Never  Vaping Use   Vaping Use: Never used  Substance and Sexual Activity   Alcohol use: Yes    Comment: occasional   Drug use: Never   Sexual activity: Not on file  Other Topics Concern   Not on file  Social History Narrative   Not on file   Social Determinants of Health   Financial Resource Strain: Not on file  Food Insecurity: Not on file  Transportation Needs: Not on file  Physical Activity: Not on file  Stress: Not on file  Social Connections: Not on file  Intimate Partner Violence: Not on file    FAMILY HISTORY: History reviewed. No pertinent family history.  ALLERGIES:  is allergic to penicillins.  MEDICATIONS:  Current Outpatient Medications  Medication Sig Dispense Refill   lidocaine (LIDODERM) 5 % Place 1 patch onto the skin daily. Remove & Discard patch within 12 hours or as directed by MD 30 patch 0   albuterol (VENTOLIN HFA) 108 (90 Base) MCG/ACT inhaler Inhale 2 puffs into the lungs every 6 (six) hours as needed for wheezing or shortness of breath. 8 g 0   blood glucose meter kit and supplies KIT  Dispense based on patient and insurance preference. Use up to four times daily as directed. 1 each 0   diphenhydrAMINE (BENADRYL) 25 MG tablet Take 1 tablet (25 mg total) by mouth at bedtime. 30 tablet    gabapentin (NEURONTIN) 300 MG capsule Take 900 mg by mouth 3 (three) times daily.     glipiZIDE (GLUCOTROL) 5 MG tablet Take 0.5 tablets (2.5 mg total) by mouth daily before breakfast. 45 tablet 0   Insulin Pen Needle 29G X 5MM MISC For sliding scale insulin injection 100 each 0   lactose free nutrition (BOOST) LIQD Take 237 mLs by mouth 3 (three) times daily between meals.     lidocaine-prilocaine (EMLA) cream Apply to affected area once 30 g 3   magic mouthwash SOLN Take 15 mLs by mouth 3 (three) times daily.     megestrol (MEGACE ES) 625 MG/5ML suspension Take 5 mLs (625 mg total) by mouth daily. 150 mL 0   megestrol (MEGACE) 400 MG/10ML suspension Take 20 mLs (800 mg total) by mouth daily. 240 mL 0   metFORMIN (GLUCOPHAGE) 500 MG tablet Take 1 tablet (500 mg total) by mouth 2 (two) times daily with a meal. 180 tablet 0   mirtazapine (REMERON) 15 MG tablet Take 2 tablets (30 mg total) by mouth at bedtime. 60 tablet 0   Multiple Vitamin (MULTIVITAMIN WITH MINERALS) TABS tablet Take 1 tablet by mouth daily. 90 tablet 0   ondansetron (ZOFRAN ODT) 8 MG disintegrating tablet Take 1 tablet (8 mg total) by mouth every 8 (eight) hours as needed for nausea or vomiting. 30 tablet 3   polyethylene glycol (MIRALAX / GLYCOLAX) 17 g packet Take 17 g by mouth daily.     No current facility-administered medications for this visit.   Facility-Administered Medications Ordered in Other Visits  Medication Dose Route Frequency Provider Last Rate Last Admin   sodium chloride flush (NS) 0.9 % injection 10 mL  10 mL Intracatheter PRN Gio Janoski, MD   10 mL at 07/22/20 1638     PHYSICAL EXAMINATION: ECOG PERFORMANCE STATUS: 2 - Symptomatic, <50% confined to bed  Vitals:   10/13/20 1325  BP: 111/85   Pulse: 97  Resp: 18  Temp: 98.2 F (36.8 C)  SpO2: 99%    Filed Weights   10/13/20 1325  Weight: 150  lb 4.8 oz (68.2 kg)   Physical Exam Constitutional:      Appearance: Normal appearance.  Cardiovascular:     Rate and Rhythm: Normal rate and regular rhythm.     Pulses: Normal pulses.     Heart sounds: Normal heart sounds.  Pulmonary:     Effort: Pulmonary effort is normal.     Breath sounds: Normal breath sounds.     Comments: No adv sounds today  Abdominal:     General: Abdomen is flat.     Palpations: Abdomen is soft.  Musculoskeletal:        General: Tenderness (tenderness in intercostal space possibly 6 th) present. No swelling.     Cervical back: Normal range of motion and neck supple. No rigidity.  Lymphadenopathy:     Cervical: No cervical adenopathy.  Skin:    General: Skin is warm.     Findings: Rash (papular rash both fore arms, mild) present.  Neurological:     Mental Status: He is alert.     LABORATORY DATA:  I have reviewed the data as listed Lab Results  Component Value Date   WBC 3.3 (L) 10/06/2020   HGB 8.0 (L) 10/06/2020   HCT 24.5 (L) 10/06/2020   MCV 98.8 10/06/2020   PLT 120 (L) 10/06/2020     Chemistry      Component Value Date/Time   NA 135 10/06/2020 0417   K 4.3 10/06/2020 0417   CL 103 10/06/2020 0417   CO2 24 10/06/2020 0417   BUN 7 10/06/2020 0417   CREATININE 0.48 (L) 10/06/2020 0417   CREATININE 0.75 09/16/2020 1118      Component Value Date/Time   CALCIUM 8.7 (L) 10/06/2020 0417   ALKPHOS 117 10/05/2020 0947   AST 17 10/05/2020 0947   AST 27 09/16/2020 1118   ALT 20 10/05/2020 0947   ALT 41 09/16/2020 1118   BILITOT 0.4 10/05/2020 0947   BILITOT 0.2 (L) 09/16/2020 1118      RADIOGRAPHIC STUDIES: I have personally reviewed the radiological images as listed and agreed with the findings in the report.  DG Chest 2 View  Result Date: 10/02/2020 CLINICAL DATA:  Chest pain EXAM: CHEST - 2 VIEW COMPARISON:   09/27/2020 FINDINGS: The lungs are symmetrically well expanded. Small bilateral pleural effusions are present, new since prior examination. Medial right basilar consolidation is unchanged. No pneumothorax. Right internal jugular chest port is seen with its tip at the superior cavoatrial junction. Cardiac size within normal limits. Pulmonary vascularity is normal. No acute bone abnormality. IMPRESSION: Interval development of small bilateral pleural effusions. Stable right medial basilar consolidation. Electronically Signed   By: Fidela Salisbury MD   On: 10/02/2020 21:00   DG Chest 2 View  Result Date: 09/28/2020 CLINICAL DATA:  Pneumonia EXAM: CHEST - 2 VIEW COMPARISON:  09/21/2020 FINDINGS: Continued medial right lower lobe airspace opacity at the right base, unchanged since prior study. Heart is normal size. Left lung clear. No visible effusions. Right Port-A-Cath is unchanged. IMPRESSION: Continued right medial basilar masslike opacity, not significantly changed. Electronically Signed   By: Rolm Baptise M.D.   On: 09/28/2020 08:29   DG Chest 2 View  Result Date: 09/21/2020 CLINICAL DATA:  51 year old male with shortness of breath, weakness. History of head neck cancer. EXAM: CHEST - 2 VIEW COMPARISON:  09/17/2020 FINDINGS: Right chest wall Port-A-Cath in place with catheter tip near the cavoatrial junction, unchanged. The mediastinal contours are within normal limits. No cardiomegaly. Similar appearing right  lower lobe perihilar opacity. Interval development of streaky peripheral opacities in the right lung base. The left lung is clear. No pleural effusion or pneumothorax. Similar appearing sclerotic opacity in the proximal right humerus. IMPRESSION: 1. Similar appearing right lower lobe perihilar masslike opacification with development of right lower lobe peripheral subsegmental atelectasis. Query sequela of aspiration, persistent pneumonia, or pulmonary neoplasm. 2. Similar appearing proximal right  humeral osseous metastasis. Electronically Signed   By: Ruthann Cancer MD   On: 09/21/2020 08:40   DG Chest 2 View  Result Date: 09/17/2020 CLINICAL DATA:  RIGHT chest pain with cough. History of head and neck cancer. EXAM: CHEST - 2 VIEW COMPARISON:  09/15/2020 PET-CT, 08/28/2020 chest radiograph and prior studies FINDINGS: RIGHT MEDIAL basilar opacity/consolidation appears increased from recent studies. Cardiomediastinal silhouette is unchanged. No pleural effusion or pneumothorax identified. A RIGHT Port-A-Cath with tip overlying SUPERIOR cavoatrial junction is again noted. Sclerotic lesion involving anterior LEFT 4th rib is again noted. IMPRESSION: RIGHT MEDIAL basilar opacity/consolidation, increased from recent studies and may represent infection, aspiration and/or atelectasis. No other significant change. Electronically Signed   By: Margarette Canada M.D.   On: 09/17/2020 12:27   CT Angio Chest PE W and/or Wo Contrast  Result Date: 10/03/2020 CLINICAL DATA:  Left chest pain EXAM: CT ANGIOGRAPHY CHEST WITH CONTRAST TECHNIQUE: Multidetector CT imaging of the chest was performed using the standard protocol during bolus administration of intravenous contrast. Multiplanar CT image reconstructions and MIPs were obtained to evaluate the vascular anatomy. CONTRAST:  74m OMNIPAQUE IOHEXOL 350 MG/ML SOLN COMPARISON:  Chest radiographs dated 10/02/2020. CTA chest dated 09/17/2020. FINDINGS: Cardiovascular: Satisfactory opacification of the bilateral pulmonary arteries to the lobar level. No evidence of pulmonary embolism. Although not tailored for evaluation of the thoracic aorta, there is no evidence of thoracic aortic aneurysm or dissection. Mild atherosclerotic calcifications of the aortic arch. Heart is normal in size.  No pericardial effusion. Mild coronary atherosclerosis of the LAD. Mediastinum/Nodes: No suspicious mediastinal lymphadenopathy. Visualized thyroid is unremarkable. Lungs/Pleura: Scarring/radiation  changes at the lung apices. Patchy opacity in the medial right lower lobe (series 5/image 93), less rounded/masslike on the current study, continuing to favor infection/pneumonia. Associated small bilateral pleural effusions, unchanged on the right and new on the left. Associated mild compressive atelectasis in the left lower lobe. 14 mm right middle lobe nodule (series 6/image 82), previously 12 mm. 6 mm posterior right lower lobe nodule (series 6/image 54), previously 4 mm. These are suspicious for metastatic disease when correlating with prior PET. Mild patchy/ground-glass opacities in the right upper lobe (series 6/image 68), suspicious for additional infection/pneumonia, new. Mild vague ground-glass opacity in the left lower lobe (series 6/image 88), equivocal. Debris in the trachea (series 6/image 31), likely reflecting secretions. No pneumothorax. Upper Abdomen: Visualized upper abdomen is grossly unremarkable. Musculoskeletal: Multifocal osseous metastases involving the left sternum, multiple ribs, multiple thoracic vertebral bodies, and the right proximal humerus, grossly unchanged. However, the soft tissue component along the left anterior 4th rib is more conspicuous on the current study, suggesting mild progression (series 5/image 84). Review of the MIP images confirms the above findings. IMPRESSION: No evidence of pulmonary embolism. Patchy right lower lobe opacity remains suspicious for pneumonia. Additional mild right upper lobe infection/pneumonia. Small bilateral pleural effusions, progressive on the left. Two right lung nodules are mildly progressive from recent CT, suspicious for metastatic disease. Mildly progressive soft tissue component associated with left anterior 4th rib metastasis. Additional multifocal osseous metastases are unchanged. Aortic Atherosclerosis (ICD10-I70.0). Electronically  Signed   By: Julian Hy M.D.   On: 10/03/2020 03:04   CT Angio Chest PE W and/or Wo  Contrast  Result Date: 09/17/2020 CLINICAL DATA:  50 year old male with cough, shortness of breath and RIGHT chest pain. EXAM: CT ANGIOGRAPHY CHEST WITH CONTRAST TECHNIQUE: Multidetector CT imaging of the chest was performed using the standard protocol during bolus administration of intravenous contrast. Multiplanar CT image reconstructions and MIPs were obtained to evaluate the vascular anatomy. CONTRAST:  30m OMNIPAQUE IOHEXOL 350 MG/ML SOLN COMPARISON:  09/15/2020 PET CT FINDINGS: Cardiovascular: Satisfactory opacification of the pulmonary arteries to the segmental level. No evidence of pulmonary embolism. Normal heart size. No pericardial effusion. Mediastinum/Nodes: No significant change. Enlarged RIGHT hilar lymph nodes are identified. Lungs/Pleura: Masslike consolidation within the RIGHT LOWER lobe now measures 10.3 x 5.1 cm, previously 2.7 x 2.5 cm on 09/15/2020 study. A 12 mm RIGHT middle lobe nodule is unchanged. A small RIGHT pleural effusion is now identified. No pneumothorax or LEFT pleural effusion. Upper Abdomen: No acute abnormality. Musculoskeletal: Sclerotic lesions within bilateral ribs, proximal RIGHT humerus, sternum and thoracic spine again noted Review of the MIP images confirms the above findings. IMPRESSION: 1. No evidence of pulmonary emboli. 2. Rapid increase in large RIGHT LOWER lobe masslike consolidation since 040/97/3532almost certainly related to infection/pneumonia given significant change over 2 days. Component of underlying malignancy is not excluded however. New small RIGHT pleural effusion. 3. Unchanged 12 mm RIGHT middle lobe nodule and sclerotic lesions within bilateral ribs, proximal RIGHT humerus, sternum and thoracic spine compatible with metastatic disease. Electronically Signed   By: JMargarette CanadaM.D.   On: 09/17/2020 17:02   NM PET Image Restag (PS) Skull Base To Thigh  Result Date: 09/16/2020 CLINICAL DATA:  Subsequent treatment strategy for head neck cancer. EXAM:  NUCLEAR MEDICINE PET SKULL BASE TO THIGH TECHNIQUE: 6.89 mCi F-18 FDG was injected intravenously. Full-ring PET imaging was performed from the skull base to thigh after the radiotracer. CT data was obtained and used for attenuation correction and anatomic localization. Fasting blood glucose: 136 mg/dl COMPARISON:  PET-CT 05/04/2020 FINDINGS: Mediastinal blood pool activity: SUV max 2.25 Liver activity: SUV max NA NECK: No hypermetabolic lymph nodes in the neck. Incidental CT findings: none CHEST: Inferior to the RIGHT hilum in the medial aspect of the RIGHT lower lobe there is new hypermetabolic consolidation measuring 2.7 cm with SUV max equal 6.2 on image 90. There is newly enlarged hypermetabolic nodule in the RIGHT middle lobe measuring 12 mm (image 41/CT series 8) with SUV max equal 5.85 ( fused data set image 93). There is interval resolution in the previously seen patchy ground-glass nodularity in the RIGHT upper lobe. Decreased metabolic activity of RIGHT hilar lymph node. Incidental CT findings: none ABDOMEN/PELVIS: The large hypermetabolic lesion in the inferior RIGHT hepatic lobe is improved significantly with minimal residual metabolic activity (SUV max equal 3.6 decreased from SUV max equal 11.5). The lesion is measurably smaller measuring 20 mm (image 129 CT series 4) compared with 35 mm. No new hepatic lesions. Incidental CT findings: none SKELETON: Marked improvement skeletal metastasis. Hypermetabolic lesions in the sacrum and bilateral iliac bones have completely resolved. Metabolic activity at the L3 vertebral body has near completely resolved and now normal background level. Sclerotic bone lesion remaining. Likewise intense radiotracer activity was present previously in the RIGHT humeral head with SUV max equal 10.1 now reduced to SUV max equal 4.4. Sclerosis of the bone has increased. Incidental CT findings: none IMPRESSION: 1.  Mixed response to therapy. 2. Marked improvement in skeletal  metastasis with near resolution of metabolic activity of previous multifocal skeletal metastasis. Sclerotic lesions remain in the underlying bone. 3. Marked improvement in solitary hypermetabolic hepatic metastasis reduced in metabolic activity and size. 4. Two new lesions in the RIGHT lung which are hypermetabolic. One RIGHT upper lobe nodule and a new focus of hypermetabolic nodular consolidation in the RIGHT infrahilar lower lobe. 5. Improvement in ground-glass nodularity in the RIGHT upper lobe suggest resolved pulmonary infection. Electronically Signed   By: Suzy Bouchard M.D.   On: 09/16/2020 09:03    All questions were answered. The patient knows to call the clinic with any problems, questions or concerns.     Benay Pike, MD 10/13/2020 2:12 PM

## 2020-10-13 NOTE — Assessment & Plan Note (Signed)
He takes gabapentin for the back pain, this is decently controlled according to the patient and has not been very bothersome.  He has not needed any hydrocodone for the back pain in a while.

## 2020-10-13 NOTE — Assessment & Plan Note (Signed)
This is a very pleasant 50 year old male patient with metastatic HPV positive squamous cell carcinoma currently on treatment with 5-fluorouracil, carboplatin and Keytruda, now on Keytruda and carboplatin given severe mucositis with 5-fluorouracil despite dose reduction status post 3 cycles with excellent response on PET scan except for 2 lung nodules which showed some progression recently hospitalized twice for pneumonia and recovering here for follow-up.  Since his hospital discharge, he continues to feel better, gaining weight, cough has improved.  He complains of a tender spot in the intercostal space on the left which has been bothering him but otherwise denies any other complaints.  We will repeat labs today and if labs are satisfactory will resume chemotherapy next week with carboplatin and Keytruda.  He will return to clinic for follow-up in 3 weeks. He expressed understanding of the recommendations and he is willing to proceed with chemotherapy.  With regards to his 2 areas in the lung that showed progression, we have discussed about him in the head and neck tumor board and the radiation oncology team did not feel that this necessitates palliative radiation at this time.  So we will continue to monitor these areas and on his next imaging.

## 2020-10-13 NOTE — Addendum Note (Signed)
Addended by: Adaline Sill on: 10/13/2020 02:15 PM   Modules accepted: Orders

## 2020-10-14 LAB — T4: T4, Total: 6.3 ug/dL (ref 4.5–12.0)

## 2020-10-17 ENCOUNTER — Telehealth: Payer: Self-pay | Admitting: Hematology and Oncology

## 2020-10-17 NOTE — Telephone Encounter (Signed)
Scheduled per 07/28 los, patient has been called and notified of upcoming appointments.

## 2020-10-20 ENCOUNTER — Inpatient Hospital Stay: Payer: Managed Care, Other (non HMO)

## 2020-10-20 ENCOUNTER — Other Ambulatory Visit: Payer: Self-pay

## 2020-10-20 ENCOUNTER — Inpatient Hospital Stay: Payer: Managed Care, Other (non HMO) | Attending: Hematology and Oncology

## 2020-10-20 DIAGNOSIS — J90 Pleural effusion, not elsewhere classified: Secondary | ICD-10-CM | POA: Insufficient documentation

## 2020-10-20 DIAGNOSIS — I2699 Other pulmonary embolism without acute cor pulmonale: Secondary | ICD-10-CM | POA: Insufficient documentation

## 2020-10-20 DIAGNOSIS — C01 Malignant neoplasm of base of tongue: Secondary | ICD-10-CM

## 2020-10-20 DIAGNOSIS — T451X5A Adverse effect of antineoplastic and immunosuppressive drugs, initial encounter: Secondary | ICD-10-CM | POA: Insufficient documentation

## 2020-10-20 DIAGNOSIS — C7951 Secondary malignant neoplasm of bone: Secondary | ICD-10-CM | POA: Insufficient documentation

## 2020-10-20 DIAGNOSIS — E119 Type 2 diabetes mellitus without complications: Secondary | ICD-10-CM | POA: Insufficient documentation

## 2020-10-20 DIAGNOSIS — R634 Abnormal weight loss: Secondary | ICD-10-CM | POA: Diagnosis not present

## 2020-10-20 DIAGNOSIS — I2510 Atherosclerotic heart disease of native coronary artery without angina pectoris: Secondary | ICD-10-CM | POA: Insufficient documentation

## 2020-10-20 DIAGNOSIS — Z794 Long term (current) use of insulin: Secondary | ICD-10-CM | POA: Diagnosis not present

## 2020-10-20 DIAGNOSIS — D6481 Anemia due to antineoplastic chemotherapy: Secondary | ICD-10-CM | POA: Insufficient documentation

## 2020-10-20 DIAGNOSIS — Z5112 Encounter for antineoplastic immunotherapy: Secondary | ICD-10-CM | POA: Diagnosis not present

## 2020-10-20 DIAGNOSIS — C090 Malignant neoplasm of tonsillar fossa: Secondary | ICD-10-CM

## 2020-10-20 DIAGNOSIS — Z7901 Long term (current) use of anticoagulants: Secondary | ICD-10-CM | POA: Diagnosis not present

## 2020-10-20 LAB — CMP (CANCER CENTER ONLY)
ALT: 11 U/L (ref 0–44)
AST: 16 U/L (ref 15–41)
Albumin: 3.6 g/dL (ref 3.5–5.0)
Alkaline Phosphatase: 115 U/L (ref 38–126)
Anion gap: 9 (ref 5–15)
BUN: 9 mg/dL (ref 6–20)
CO2: 25 mmol/L (ref 22–32)
Calcium: 9.2 mg/dL (ref 8.9–10.3)
Chloride: 105 mmol/L (ref 98–111)
Creatinine: 0.58 mg/dL — ABNORMAL LOW (ref 0.61–1.24)
GFR, Estimated: >60 mL/min (ref >60)
Glucose, Bld: 88 mg/dL (ref 70–99)
Potassium: 3.5 mmol/L (ref 3.5–5.1)
Sodium: 139 mmol/L (ref 135–145)
Total Bilirubin: 0.3 mg/dL (ref 0.3–1.2)
Total Protein: 6.6 g/dL (ref 6.5–8.1)

## 2020-10-20 LAB — CBC WITH DIFFERENTIAL (CANCER CENTER ONLY)
Abs Immature Granulocytes: 0.11 10*3/uL — ABNORMAL HIGH (ref 0.00–0.07)
Basophils Absolute: 0 10*3/uL (ref 0.0–0.1)
Basophils Relative: 1 %
Eosinophils Absolute: 0.1 10*3/uL (ref 0.0–0.5)
Eosinophils Relative: 2 %
HCT: 25.4 % — ABNORMAL LOW (ref 39.0–52.0)
Hemoglobin: 8.5 g/dL — ABNORMAL LOW (ref 13.0–17.0)
Immature Granulocytes: 2 %
Lymphocytes Relative: 13 %
Lymphs Abs: 0.6 10*3/uL — ABNORMAL LOW (ref 0.7–4.0)
MCH: 32.9 pg (ref 26.0–34.0)
MCHC: 33.5 g/dL (ref 30.0–36.0)
MCV: 98.4 fL (ref 80.0–100.0)
Monocytes Absolute: 0.8 10*3/uL (ref 0.1–1.0)
Monocytes Relative: 17 %
Neutro Abs: 3 10*3/uL (ref 1.7–7.7)
Neutrophils Relative %: 65 %
Platelet Count: 194 10*3/uL (ref 150–400)
RBC: 2.58 MIL/uL — ABNORMAL LOW (ref 4.22–5.81)
RDW: 18.5 % — ABNORMAL HIGH (ref 11.5–15.5)
WBC Count: 4.5 10*3/uL (ref 4.0–10.5)
nRBC: 0.4 % — ABNORMAL HIGH (ref 0.0–0.2)

## 2020-10-20 LAB — SAMPLE TO BLOOD BANK

## 2020-10-20 MED ORDER — PALONOSETRON HCL INJECTION 0.25 MG/5ML
0.2500 mg | Freq: Once | INTRAVENOUS | Status: AC
  Administered 2020-10-20: 0.25 mg via INTRAVENOUS

## 2020-10-20 MED ORDER — SODIUM CHLORIDE 0.9 % IV SOLN
200.0000 mg | Freq: Once | INTRAVENOUS | Status: AC
  Administered 2020-10-20: 200 mg via INTRAVENOUS
  Filled 2020-10-20: qty 8

## 2020-10-20 MED ORDER — HEPARIN SOD (PORK) LOCK FLUSH 100 UNIT/ML IV SOLN
500.0000 [IU] | Freq: Once | INTRAVENOUS | Status: AC | PRN
  Administered 2020-10-20: 500 [IU]
  Filled 2020-10-20: qty 5

## 2020-10-20 MED ORDER — SODIUM CHLORIDE 0.9 % IV SOLN
150.0000 mg | Freq: Once | INTRAVENOUS | Status: AC
  Administered 2020-10-20: 150 mg via INTRAVENOUS
  Filled 2020-10-20: qty 150

## 2020-10-20 MED ORDER — SODIUM CHLORIDE 0.9 % IV SOLN
10.0000 mg | Freq: Once | INTRAVENOUS | Status: AC
  Administered 2020-10-20: 10 mg via INTRAVENOUS
  Filled 2020-10-20: qty 10

## 2020-10-20 MED ORDER — SODIUM CHLORIDE 0.9% FLUSH
10.0000 mL | INTRAVENOUS | Status: DC | PRN
  Administered 2020-10-20: 10 mL
  Filled 2020-10-20: qty 10

## 2020-10-20 MED ORDER — SODIUM CHLORIDE 0.9 % IV SOLN
Freq: Once | INTRAVENOUS | Status: AC
  Filled 2020-10-20: qty 250

## 2020-10-20 MED ORDER — SODIUM CHLORIDE 0.9 % IV SOLN
522.0000 mg | Freq: Once | INTRAVENOUS | Status: AC
  Administered 2020-10-20: 520 mg via INTRAVENOUS
  Filled 2020-10-20: qty 52

## 2020-10-20 MED ORDER — PALONOSETRON HCL INJECTION 0.25 MG/5ML
INTRAVENOUS | Status: AC
  Filled 2020-10-20: qty 5

## 2020-10-20 NOTE — Patient Instructions (Signed)
[facility]  Discharge Instructions: Thank you for choosing [facility] to provide your oncology and hematology care.   If you have a lab appointment with the [facility], please go directly to the [facility] and check in at the registration area.   Wear comfortable clothing and clothing appropriate for easy access to any Portacath or PICC line.   We strive to give you quality time with your provider. You may need to reschedule your appointment if you arrive late (15 or more minutes).  Arriving late affects you and other patients whose appointments are after yours.  Also, if you miss three or more appointments without notifying the office, you may be dismissed from the clinic at the provider's discretion.      For prescription refill requests, have your pharmacy contact our office and allow 72 hours for refills to be completed.    Today you received the following chemotherapy and/or immunotherapy agents keytruda/carboplatin      To help prevent nausea and vomiting after your treatment, we encourage you to take your nausea medication as directed.  BELOW ARE SYMPTOMS THAT SHOULD BE REPORTED IMMEDIATELY: *FEVER GREATER THAN 100.4 F (38 C) OR HIGHER *CHILLS OR SWEATING *NAUSEA AND VOMITING THAT IS NOT CONTROLLED WITH YOUR NAUSEA MEDICATION *UNUSUAL SHORTNESS OF BREATH *UNUSUAL BRUISING OR BLEEDING *URINARY PROBLEMS (pain or burning when urinating, or frequent urination) *BOWEL PROBLEMS (unusual diarrhea, constipation, pain near the anus) TENDERNESS IN MOUTH AND THROAT WITH OR WITHOUT PRESENCE OF ULCERS (sore throat, sores in mouth, or a toothache) UNUSUAL RASH, SWELLING OR PAIN  UNUSUAL VAGINAL DISCHARGE OR ITCHING   Items with * indicate a potential emergency and should be followed up as soon as possible or go to the Emergency Department if any problems should occur.  Please show the CHEMOTHERAPY ALERT CARD or IMMUNOTHERAPY ALERT CARD at  check-in to the Emergency Department and triage nurse.  Should you have questions after your visit or need to cancel or reschedule your appointment, please contact [facility]  Dept: [phone]  and follow the prompts.  Office hours are 8:00 a.m. to 4:30 p.m. Monday - Friday. Please note that voicemails left after 4:00 p.m. may not be returned until the following business day.  We are closed weekends and major holidays. You have access to a nurse at all times for urgent questions. Please call the main number to the clinic Dept: [phone] and follow the prompts.   For any non-urgent questions, you may also contact your provider using MyChart. We now offer e-Visits for anyone 26 and older to request care online for non-urgent symptoms. For details visit [email or web address].   Also download the MyChart app! Go to the app store, search "MyChart", open the app, select [facility], and log in with your MyChart username and password.  Due to Covid, a mask is required upon entering the hospital/clinic. If you do not have a mask, one will be given to you upon arrival. For doctor visits, patients may have 1 support person aged 83 or older with them. For treatment visits, patients cannot have anyone with them due to current Covid guidelines and our immunocompromised population.

## 2020-10-20 NOTE — Patient Instructions (Signed)
Implanted Port Home Guide An implanted port is a device that is placed under the skin. It is usually placed in the chest. The device can be used to give IV medicine, to take blood, or for dialysis. You may have an implanted port if: You need IV medicine that would be irritating to the small veins in your hands or arms. You need IV medicines, such as antibiotics, for a long period of time. You need IV nutrition for a long period of time. You need dialysis. When you have a port, your health care provider can choose to use the port instead of veins in your arms for these procedures. You may have fewer limitations when using a port than you would if you used other types of long-term IVs, and you will likely be able to return to normal activities afteryour incision heals. An implanted port has two main parts: Reservoir. The reservoir is the part where a needle is inserted to give medicines or draw blood. The reservoir is round. After it is placed, it appears as a small, raised area under your skin. Catheter. The catheter is a thin, flexible tube that connects the reservoir to a vein. Medicine that is inserted into the reservoir goes into the catheter and then into the vein. How is my port accessed? To access your port: A numbing cream may be placed on the skin over the port site. Your health care provider will put on a mask and sterile gloves. The skin over your port will be cleaned carefully with a germ-killing soap and allowed to dry. Your health care provider will gently pinch the port and insert a needle into it. Your health care provider will check for a blood return to make sure the port is in the vein and is not clogged. If your port needs to remain accessed to get medicine continuously (constant infusion), your health care provider will place a clear bandage (dressing) over the needle site. The dressing and needle will need to be changed every week, or as told by your health care provider. What  is flushing? Flushing helps keep the port from getting clogged. Follow instructions from your health care provider about how and when to flush the port. Ports are usually flushed with saline solution or a medicine called heparin. The need for flushing will depend on how the port is used: If the port is only used from time to time to give medicines or draw blood, the port may need to be flushed: Before and after medicines have been given. Before and after blood has been drawn. As part of routine maintenance. Flushing may be recommended every 4-6 weeks. If a constant infusion is running, the port may not need to be flushed. Throw away any syringes in a disposal container that is meant for sharp items (sharps container). You can buy a sharps container from a pharmacy, or you can make one by using an empty hard plastic bottle with a cover. How long will my port stay implanted? The port can stay in for as long as your health care provider thinks it is needed. When it is time for the port to come out, a surgery will be done to remove it. The surgery will be similar to the procedure that was done to putthe port in. Follow these instructions at home:  Flush your port as told by your health care provider. If you need an infusion over several days, follow instructions from your health care provider about how to take   care of your port site. Make sure you: Wash your hands with soap and water before you change your dressing. If soap and water are not available, use alcohol-based hand sanitizer. Change your dressing as told by your health care provider. Place any used dressings or infusion bags into a plastic bag. Throw that bag in the trash. Keep the dressing that covers the needle clean and dry. Do not get it wet. Do not use scissors or sharp objects near the tube. Keep the tube clamped, unless it is being used. Check your port site every day for signs of infection. Check for: Redness, swelling, or  pain. Fluid or blood. Pus or a bad smell. Protect the skin around the port site. Avoid wearing bra straps that rub or irritate the site. Protect the skin around your port from seat belts. Place a soft pad over your chest if needed. Bathe or shower as told by your health care provider. The site may get wet as long as you are not actively receiving an infusion. Return to your normal activities as told by your health care provider. Ask your health care provider what activities are safe for you. Carry a medical alert card or wear a medical alert bracelet at all times. This will let health care providers know that you have an implanted port in case of an emergency. Get help right away if: You have redness, swelling, or pain at the port site. You have fluid or blood coming from your port site. You have pus or a bad smell coming from the port site. You have a fever. Summary Implanted ports are usually placed in the chest for long-term IV access. Follow instructions from your health care provider about flushing the port and changing bandages (dressings). Take care of the area around your port by avoiding clothing that puts pressure on the area, and by watching for signs of infection. Protect the skin around your port from seat belts. Place a soft pad over your chest if needed. Get help right away if you have a fever or you have redness, swelling, pain, drainage, or a bad smell at the port site. This information is not intended to replace advice given to you by your health care provider. Make sure you discuss any questions you have with your healthcare provider. Document Revised: 07/20/2019 Document Reviewed: 07/20/2019 Elsevier Patient Education  2022 [employer]  

## 2020-10-21 LAB — T4: T4, Total: 7.4 ug/dL (ref 4.5–12.0)

## 2020-10-21 LAB — TSH: TSH: 4.828 u[IU]/mL — ABNORMAL HIGH (ref 0.320–4.118)

## 2020-10-26 ENCOUNTER — Emergency Department (HOSPITAL_COMMUNITY): Payer: Managed Care, Other (non HMO)

## 2020-10-26 ENCOUNTER — Other Ambulatory Visit: Payer: Self-pay

## 2020-10-26 ENCOUNTER — Inpatient Hospital Stay (HOSPITAL_COMMUNITY): Payer: Managed Care, Other (non HMO)

## 2020-10-26 ENCOUNTER — Encounter (HOSPITAL_COMMUNITY): Payer: Self-pay

## 2020-10-26 ENCOUNTER — Telehealth: Payer: Self-pay

## 2020-10-26 ENCOUNTER — Inpatient Hospital Stay (HOSPITAL_COMMUNITY)
Admission: EM | Admit: 2020-10-26 | Discharge: 2020-10-30 | DRG: 175 | Disposition: A | Discharge status: Home / Self Care | Payer: Managed Care, Other (non HMO) | Attending: Student | Admitting: Student

## 2020-10-26 DIAGNOSIS — Z79818 Long term (current) use of other agents affecting estrogen receptors and estrogen levels: Secondary | ICD-10-CM | POA: Diagnosis not present

## 2020-10-26 DIAGNOSIS — Z7984 Long term (current) use of oral hypoglycemic drugs: Secondary | ICD-10-CM | POA: Diagnosis not present

## 2020-10-26 DIAGNOSIS — C7800 Secondary malignant neoplasm of unspecified lung: Secondary | ICD-10-CM | POA: Diagnosis present

## 2020-10-26 DIAGNOSIS — C01 Malignant neoplasm of base of tongue: Secondary | ICD-10-CM | POA: Diagnosis present

## 2020-10-26 DIAGNOSIS — R54 Age-related physical debility: Secondary | ICD-10-CM | POA: Diagnosis present

## 2020-10-26 DIAGNOSIS — I2609 Other pulmonary embolism with acute cor pulmonale: Principal | ICD-10-CM

## 2020-10-26 DIAGNOSIS — Z8701 Personal history of pneumonia (recurrent): Secondary | ICD-10-CM | POA: Diagnosis not present

## 2020-10-26 DIAGNOSIS — Z20822 Contact with and (suspected) exposure to covid-19: Secondary | ICD-10-CM | POA: Diagnosis present

## 2020-10-26 DIAGNOSIS — Z880 Allergy status to penicillin: Secondary | ICD-10-CM | POA: Diagnosis not present

## 2020-10-26 DIAGNOSIS — Z79891 Long term (current) use of opiate analgesic: Secondary | ICD-10-CM

## 2020-10-26 DIAGNOSIS — Z8616 Personal history of COVID-19: Secondary | ICD-10-CM | POA: Diagnosis not present

## 2020-10-26 DIAGNOSIS — C799 Secondary malignant neoplasm of unspecified site: Secondary | ICD-10-CM | POA: Diagnosis not present

## 2020-10-26 DIAGNOSIS — E43 Unspecified severe protein-calorie malnutrition: Secondary | ICD-10-CM | POA: Diagnosis present

## 2020-10-26 DIAGNOSIS — R531 Weakness: Secondary | ICD-10-CM | POA: Diagnosis not present

## 2020-10-26 DIAGNOSIS — R079 Chest pain, unspecified: Secondary | ICD-10-CM

## 2020-10-26 DIAGNOSIS — Z794 Long term (current) use of insulin: Secondary | ICD-10-CM

## 2020-10-26 DIAGNOSIS — Z79899 Other long term (current) drug therapy: Secondary | ICD-10-CM

## 2020-10-26 DIAGNOSIS — C7951 Secondary malignant neoplasm of bone: Secondary | ICD-10-CM | POA: Diagnosis present

## 2020-10-26 DIAGNOSIS — R5381 Other malaise: Secondary | ICD-10-CM | POA: Diagnosis not present

## 2020-10-26 DIAGNOSIS — R638 Other symptoms and signs concerning food and fluid intake: Secondary | ICD-10-CM | POA: Diagnosis not present

## 2020-10-26 DIAGNOSIS — K1230 Oral mucositis (ulcerative), unspecified: Secondary | ICD-10-CM | POA: Diagnosis present

## 2020-10-26 DIAGNOSIS — R072 Precordial pain: Secondary | ICD-10-CM | POA: Diagnosis not present

## 2020-10-26 DIAGNOSIS — I2699 Other pulmonary embolism without acute cor pulmonale: Secondary | ICD-10-CM | POA: Diagnosis present

## 2020-10-26 DIAGNOSIS — D638 Anemia in other chronic diseases classified elsewhere: Secondary | ICD-10-CM | POA: Diagnosis present

## 2020-10-26 DIAGNOSIS — E1165 Type 2 diabetes mellitus with hyperglycemia: Secondary | ICD-10-CM | POA: Diagnosis present

## 2020-10-26 DIAGNOSIS — R627 Adult failure to thrive: Secondary | ICD-10-CM | POA: Diagnosis present

## 2020-10-26 DIAGNOSIS — R911 Solitary pulmonary nodule: Secondary | ICD-10-CM | POA: Diagnosis not present

## 2020-10-26 DIAGNOSIS — R0602 Shortness of breath: Secondary | ICD-10-CM | POA: Diagnosis present

## 2020-10-26 DIAGNOSIS — J9601 Acute respiratory failure with hypoxia: Secondary | ICD-10-CM | POA: Diagnosis present

## 2020-10-26 DIAGNOSIS — C029 Malignant neoplasm of tongue, unspecified: Secondary | ICD-10-CM | POA: Diagnosis not present

## 2020-10-26 LAB — URINALYSIS, ROUTINE W REFLEX MICROSCOPIC
Bilirubin Urine: NEGATIVE
Glucose, UA: NEGATIVE mg/dL
Hgb urine dipstick: NEGATIVE
Ketones, ur: NEGATIVE mg/dL
Leukocytes,Ua: NEGATIVE
Nitrite: NEGATIVE
Protein, ur: NEGATIVE mg/dL
Specific Gravity, Urine: 1.017 (ref 1.005–1.030)
pH: 7 (ref 5.0–8.0)

## 2020-10-26 LAB — GLUCOSE, CAPILLARY: Glucose-Capillary: 258 mg/dL — ABNORMAL HIGH (ref 70–99)

## 2020-10-26 LAB — CBC WITH DIFFERENTIAL/PLATELET
Abs Immature Granulocytes: 0.48 10*3/uL — ABNORMAL HIGH (ref 0.00–0.07)
Basophils Absolute: 0.1 10*3/uL (ref 0.0–0.1)
Basophils Relative: 1 %
Eosinophils Absolute: 0.1 10*3/uL (ref 0.0–0.5)
Eosinophils Relative: 1 %
HCT: 34.1 % — ABNORMAL LOW (ref 39.0–52.0)
Hemoglobin: 11 g/dL — ABNORMAL LOW (ref 13.0–17.0)
Immature Granulocytes: 5 %
Lymphocytes Relative: 5 %
Lymphs Abs: 0.5 10*3/uL — ABNORMAL LOW (ref 0.7–4.0)
MCH: 32.9 pg (ref 26.0–34.0)
MCHC: 32.3 g/dL (ref 30.0–36.0)
MCV: 102.1 fL — ABNORMAL HIGH (ref 80.0–100.0)
Monocytes Absolute: 0.7 10*3/uL (ref 0.1–1.0)
Monocytes Relative: 7 %
Neutro Abs: 7.5 10*3/uL (ref 1.7–7.7)
Neutrophils Relative %: 81 %
Platelets: 208 10*3/uL (ref 150–400)
RBC: 3.34 MIL/uL — ABNORMAL LOW (ref 4.22–5.81)
RDW: 17.6 % — ABNORMAL HIGH (ref 11.5–15.5)
WBC: 9.2 10*3/uL (ref 4.0–10.5)
nRBC: 0 % (ref 0.0–0.2)

## 2020-10-26 LAB — BASIC METABOLIC PANEL
Anion gap: 13 (ref 5–15)
BUN: 11 mg/dL (ref 6–20)
CO2: 26 mmol/L (ref 22–32)
Calcium: 9.3 mg/dL (ref 8.9–10.3)
Chloride: 97 mmol/L — ABNORMAL LOW (ref 98–111)
Creatinine, Ser: 0.59 mg/dL — ABNORMAL LOW (ref 0.61–1.24)
GFR, Estimated: >60 mL/min (ref >60)
Glucose, Bld: 211 mg/dL — ABNORMAL HIGH (ref 70–99)
Potassium: 3.5 mmol/L (ref 3.5–5.1)
Sodium: 136 mmol/L (ref 135–145)

## 2020-10-26 LAB — TYPE AND SCREEN
ABO/RH(D): O POS
Antibody Screen: NEGATIVE

## 2020-10-26 LAB — RESP PANEL BY RT-PCR (FLU A&B, COVID) ARPGX2
Influenza A by PCR: NEGATIVE
Influenza B by PCR: NEGATIVE
SARS Coronavirus 2 by RT PCR: NEGATIVE

## 2020-10-26 LAB — BRAIN NATRIURETIC PEPTIDE: B Natriuretic Peptide: 45.2 pg/mL (ref 0.0–100.0)

## 2020-10-26 LAB — HEPARIN LEVEL (UNFRACTIONATED): Heparin Unfractionated: 0.16 IU/mL — ABNORMAL LOW (ref 0.30–0.70)

## 2020-10-26 LAB — TROPONIN I (HIGH SENSITIVITY)
Troponin I (High Sensitivity): 54 ng/L — ABNORMAL HIGH (ref <18)
Troponin I (High Sensitivity): 59 ng/L — ABNORMAL HIGH (ref <18)

## 2020-10-26 IMAGING — CT CT ANGIO CHEST
2 of 6 series · 17 of 36 positions shown · IV contrast (omnipaque)
Comparison: Plain film [DATE].  CT [DATE]

CLINICAL DATA: Metastatic head neck cancer. Recurrent pneumonia.
Cough. Shortness of breath.

EXAM:
CT ANGIOGRAPHY CHEST WITH CONTRAST
TECHNIQUE: Multidetector CT imaging of the chest was performed using the
standard protocol during bolus administration of intravenous
contrast. Multiplanar CT image reconstructions and MIPs were
obtained to evaluate the vascular anatomy.
CONTRAST:  80mL OMNIPAQUE IOHEXOL 350 MG/ML SOLN

[Series 5: thins · axial · 0.72mm/px · z∈[-350,-85]mm · 16 of 299 slices shown]
[im 17/299  lung]
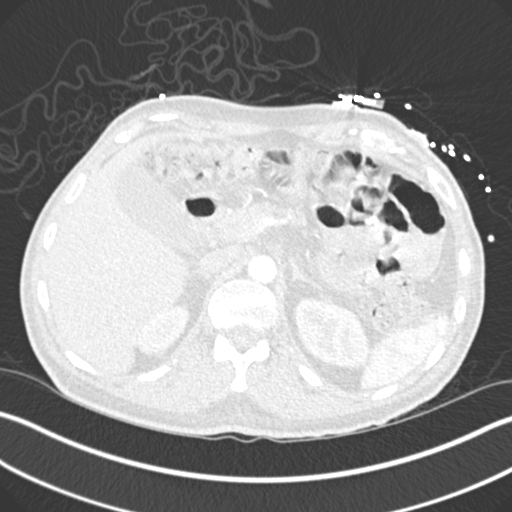
[im 34/299  mediastinal]
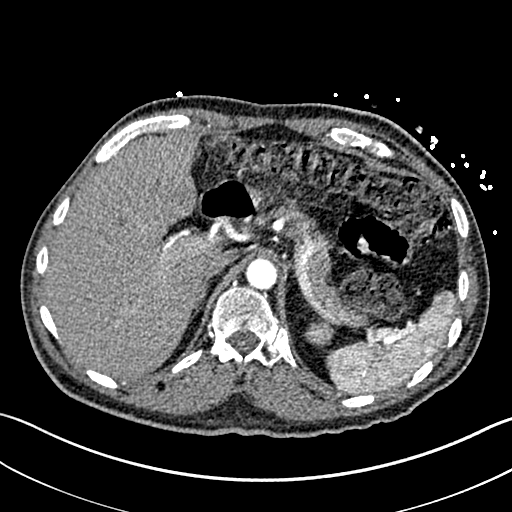
[im 50/299  lung]
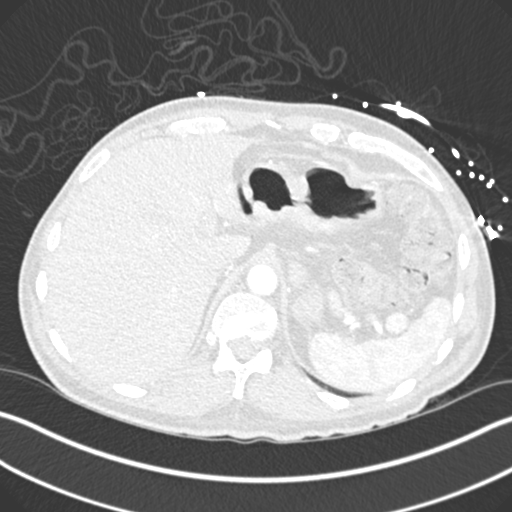
[im 67/299  mediastinal]
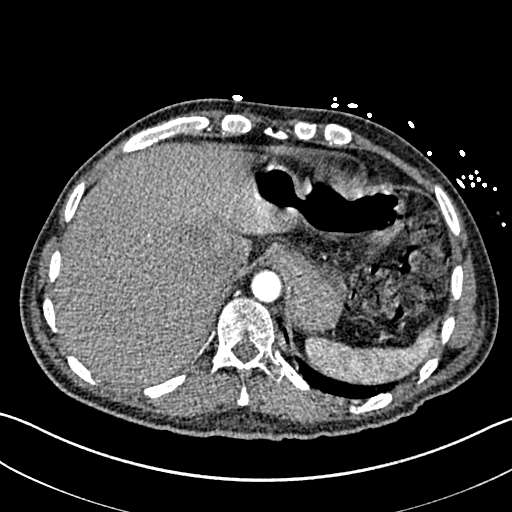
[im 83/299  lung]
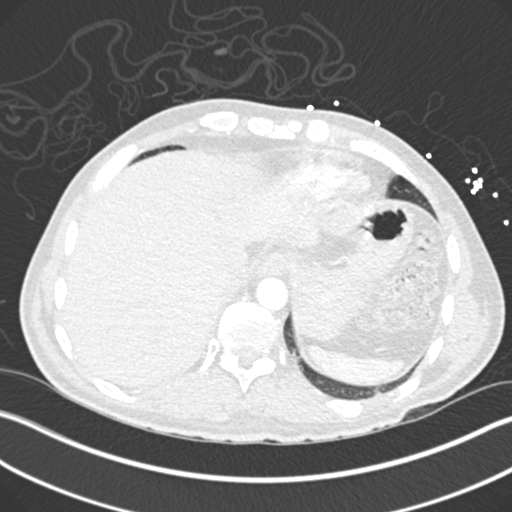
[im 100/299  mediastinal]
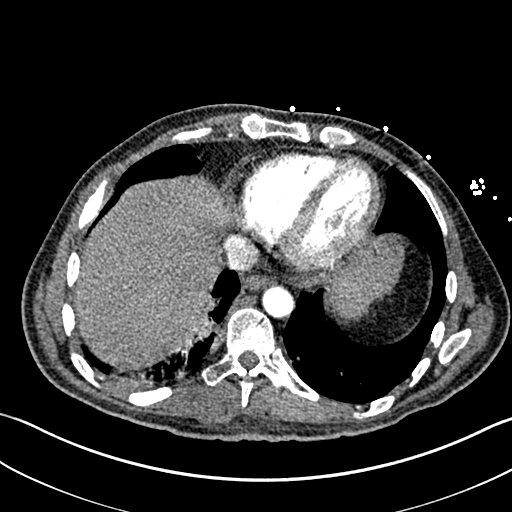
[im 116/299  lung]
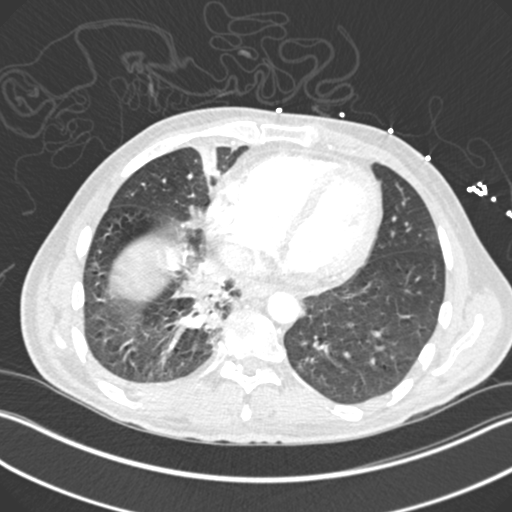
[im 133/299  mediastinal]
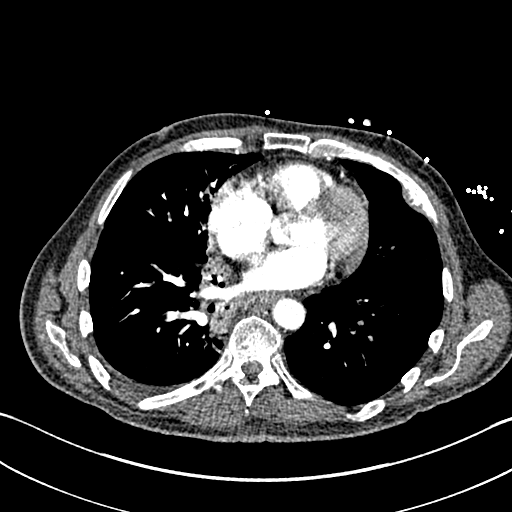
[im 166/299  lung]
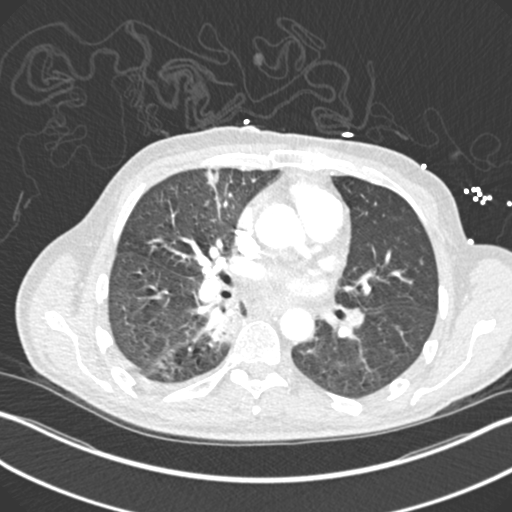
[im 183/299  mediastinal]
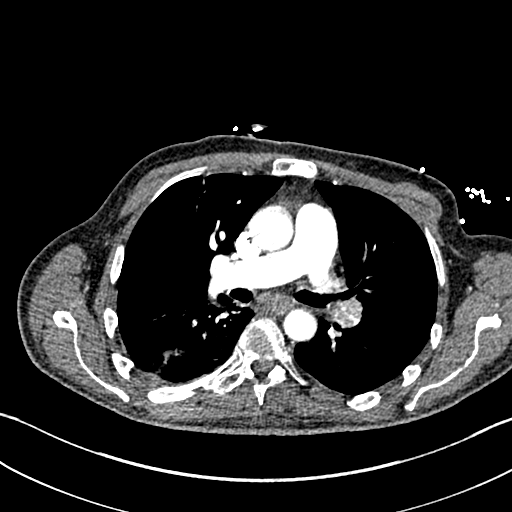
[im 199/299  lung]
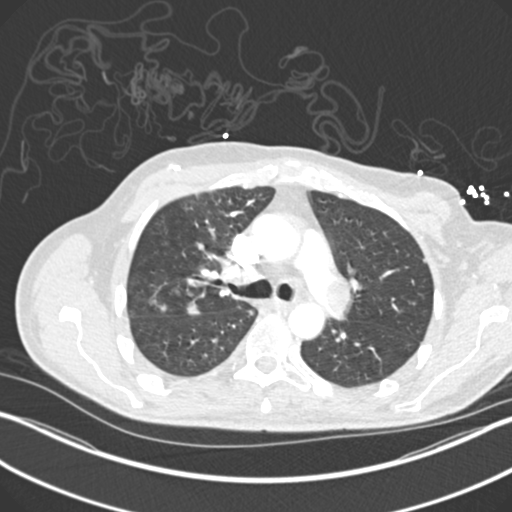
[im 216/299  mediastinal]
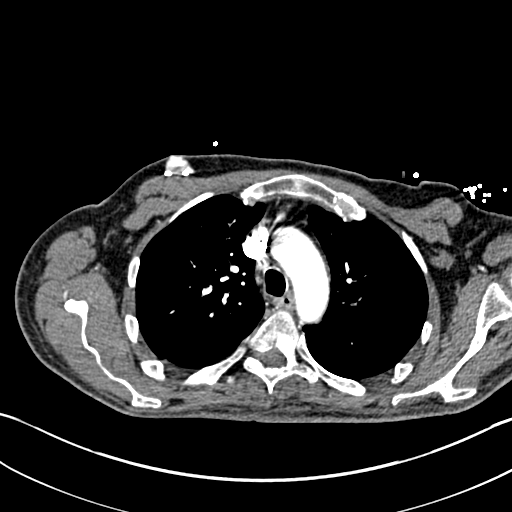
[im 232/299  lung]
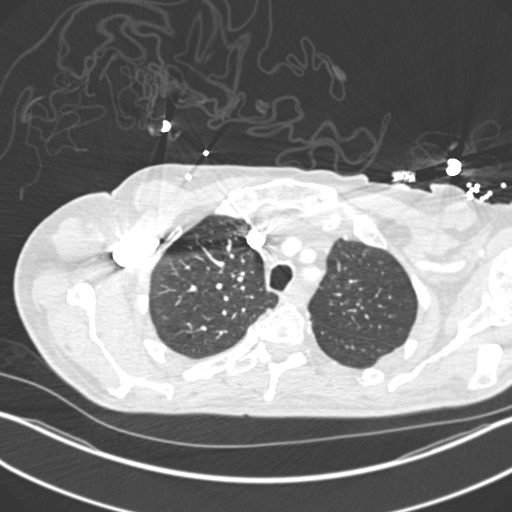
[im 249/299  mediastinal]
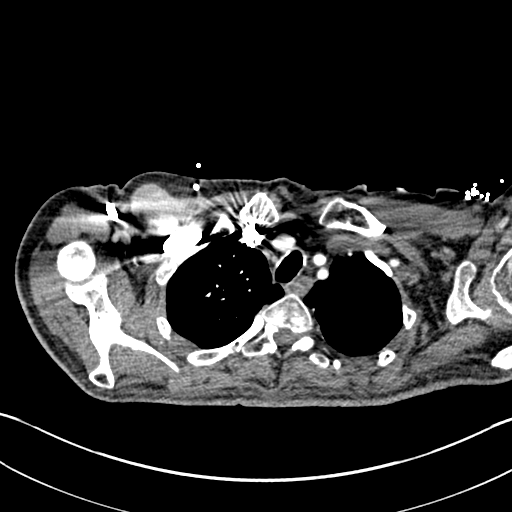
[im 265/299  lung]
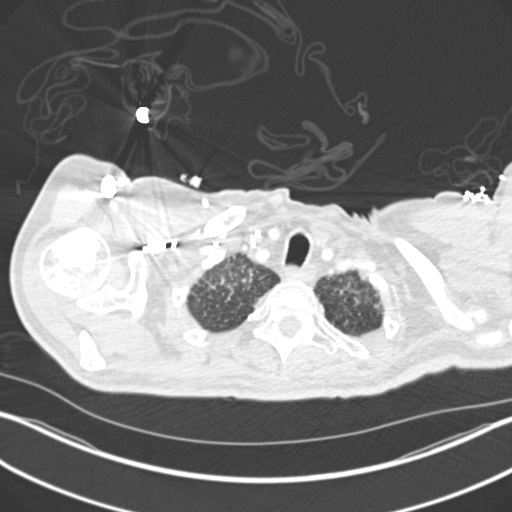
[im 282/299  mediastinal]
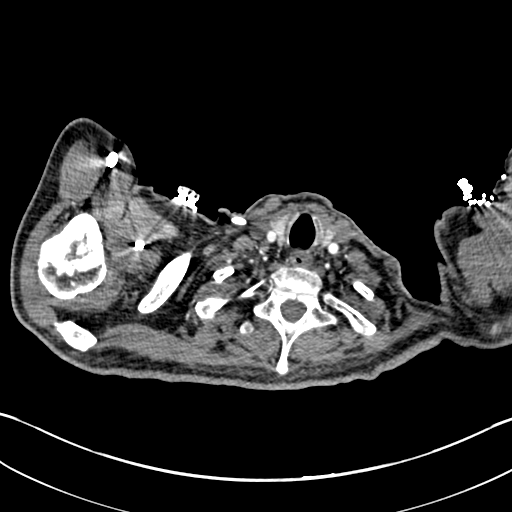

[Series 6: coronal mpr · coronal · 0.65mm/px · 1 of 135 slices shown]
[im 68/135  mediastinal]
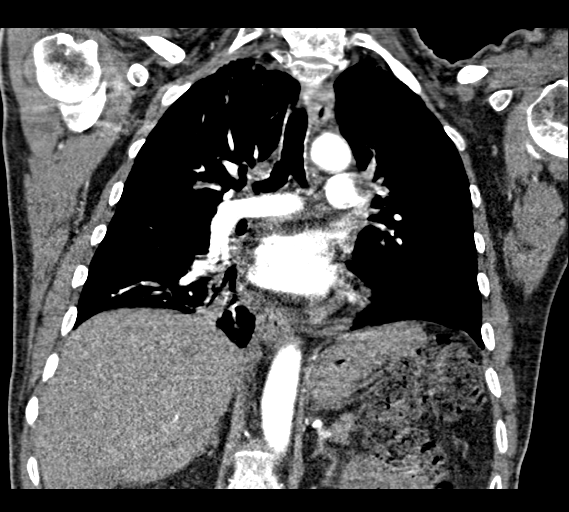

[17 of 36 positions shown; findings below may reference images not displayed]

FINDINGS: Cardiovascular: The quality of this exam for evaluation of pulmonary
embolism is sufficient. There is mild motion degradation throughout.
However, a large left-sided pulmonary embolism is seen within the
main pulmonary artery and lobar branches.

Right heart strain, as evidenced by an RV to LV ratio of 4.6/3.9=
1.2.

Aortic atherosclerosis. Normal heart size, without pericardial
effusion. Multivessel coronary artery atherosclerosis. Right
Port-A-Cath tip mid right atrium.

Mediastinum/Nodes: No mediastinal or hilar adenopathy.

Lungs/Pleura: Tiny right pleural effusion, minimally decreased. The
left-sided pleural effusion has resolved.

Slight improvement in central right lower lobe airspace disease.
Mild superior segment right lower lobe airspace disease laterally
including on 62/10, new.

Multifocal patchy right upper lobe airspace disease is slightly
increased.

Right upper lobe 5 mm nodule on 38/10 is increased from 2 mm on the
prior.

A right middle lobe 1.5 cm nodule on 79/10 has minimally enlarged
from 1.4 cm on the prior.

Somewhat vague pleural-based right lower lobe 7 mm nodule on 54/10
is similar to minimally enlarged from 6 mm on the prior.

Upper Abdomen: Motion degradation continuing into the upper abdomen.
Grossly normal imaged liver, spleen, stomach, pancreas, gallbladder,
adrenal glands, kidneys.

Musculoskeletal: Multifocal osseous metastasis again identified,
primarily sclerotic in appearance. Anterior left fourth rib osseous
and soft tissue density lesion including on 81/4 is relatively
similar. An expansile lesion involving the sixth anterior right rib
including on 104/4 is felt to be slightly increased. Compression
deformity at T8 is moderate and similar.

Review of the MIP images confirms the above findings.
IMPRESSION: 1. Mildly motion degraded exam.
2. Large volume left-sided pulmonary embolism with evidence of right
heart strain. (RV/LV Ratio = 1.2) consistent with at least
submassive (intermediate risk) PE. The presence of right heart
strain has been associated with an increased risk of morbidity and
mortality. Please refer to the "PE Focused" order set in [REDACTED].
3. Shifting right-sided pulmonary opacities, most consistent with
infection or aspiration.
4. Pulmonary nodules, felt to be slightly progressive and indicative
of metastatic disease.
5. Widespread osseous metastasis, mildly progressive as above.
6. Coronary artery atherosclerosis. Aortic Atherosclerosis
([ZC]-[ZC]).

Case was discussed with Dr FASSIE at [DATE].

## 2020-10-26 IMAGING — CR DG CHEST 2V
2 series · 2 of 2 positions shown · non-contrast
Comparison: [DATE]

CLINICAL DATA: Shortness of breath.

EXAM:
CHEST - 2 VIEW

[w chest pa]
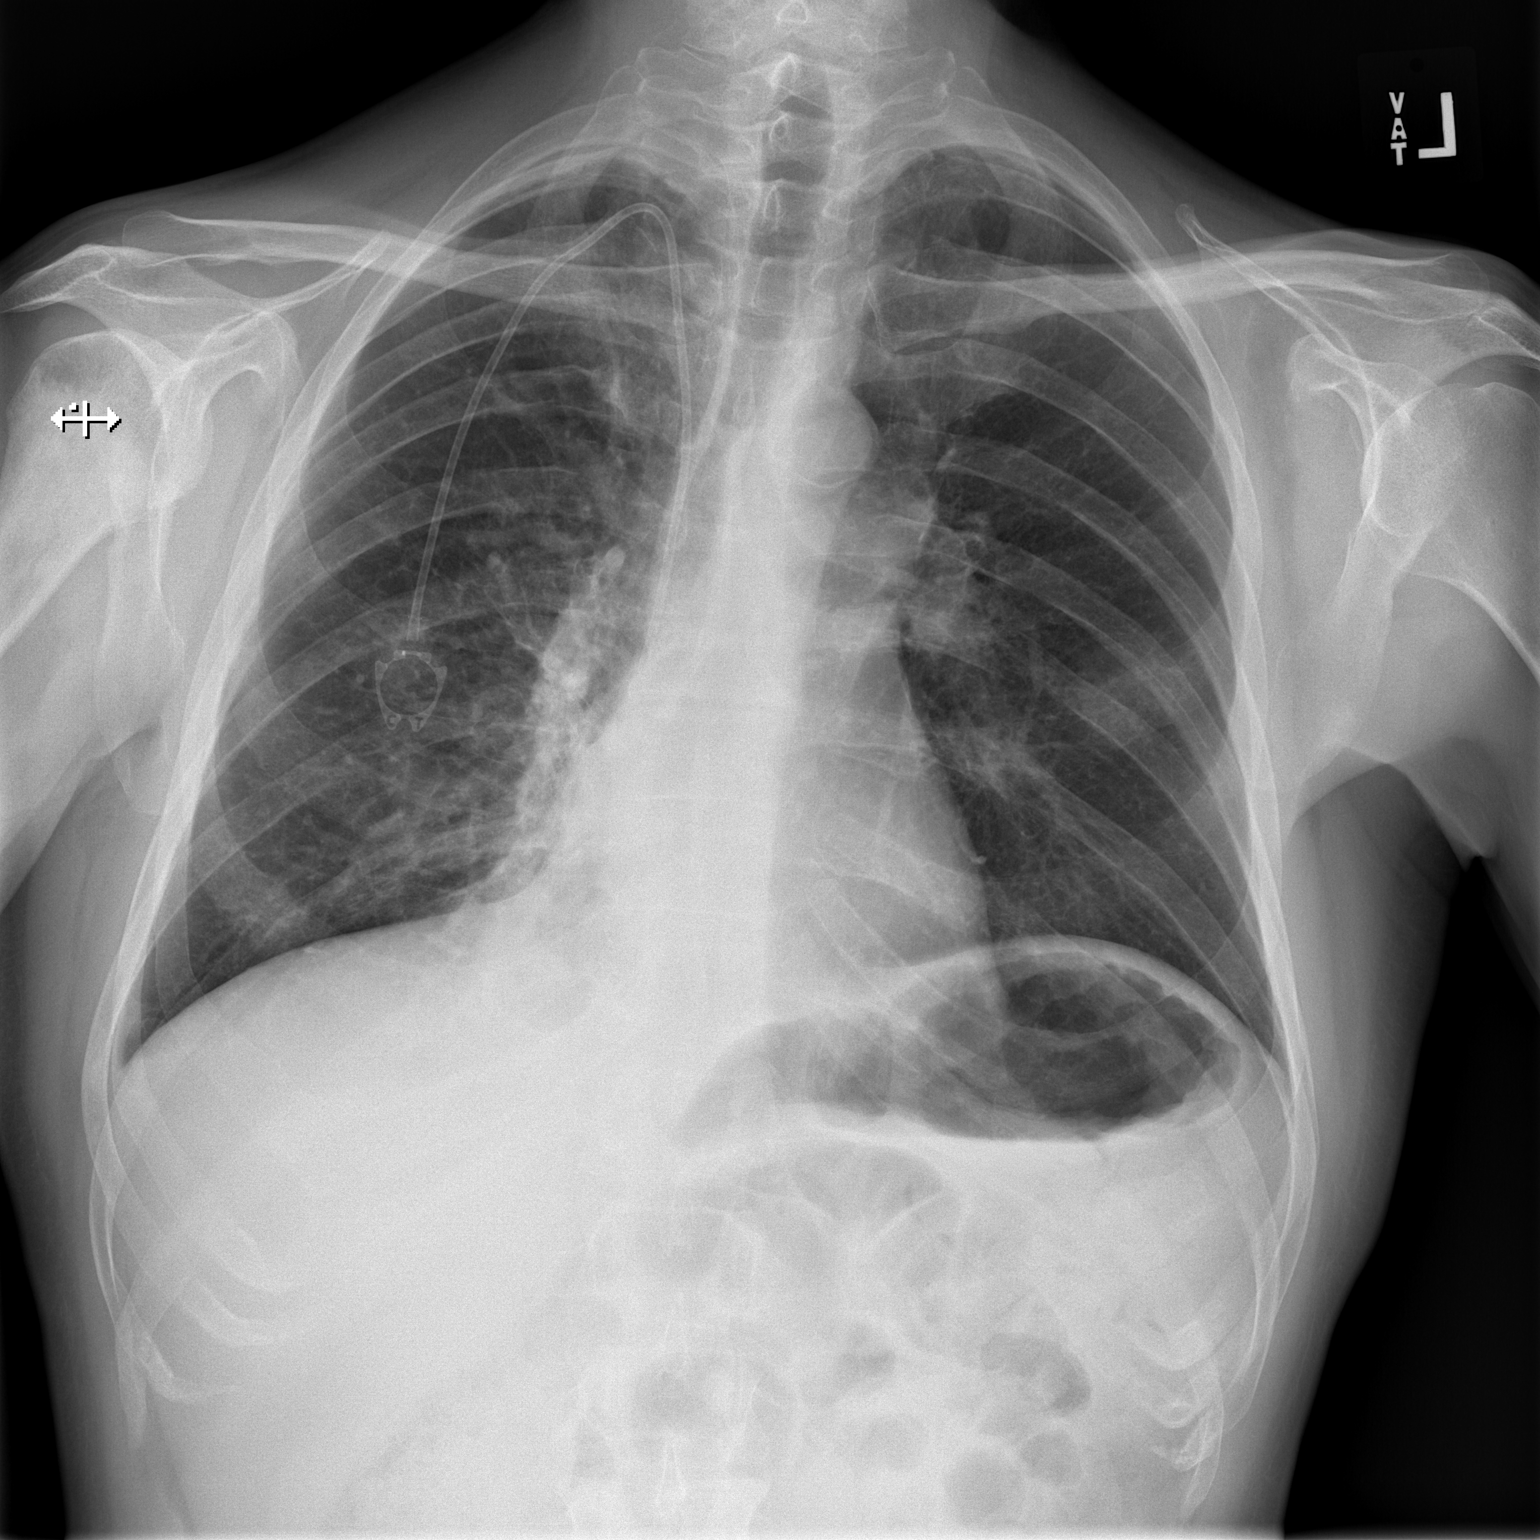

[w chest lat]
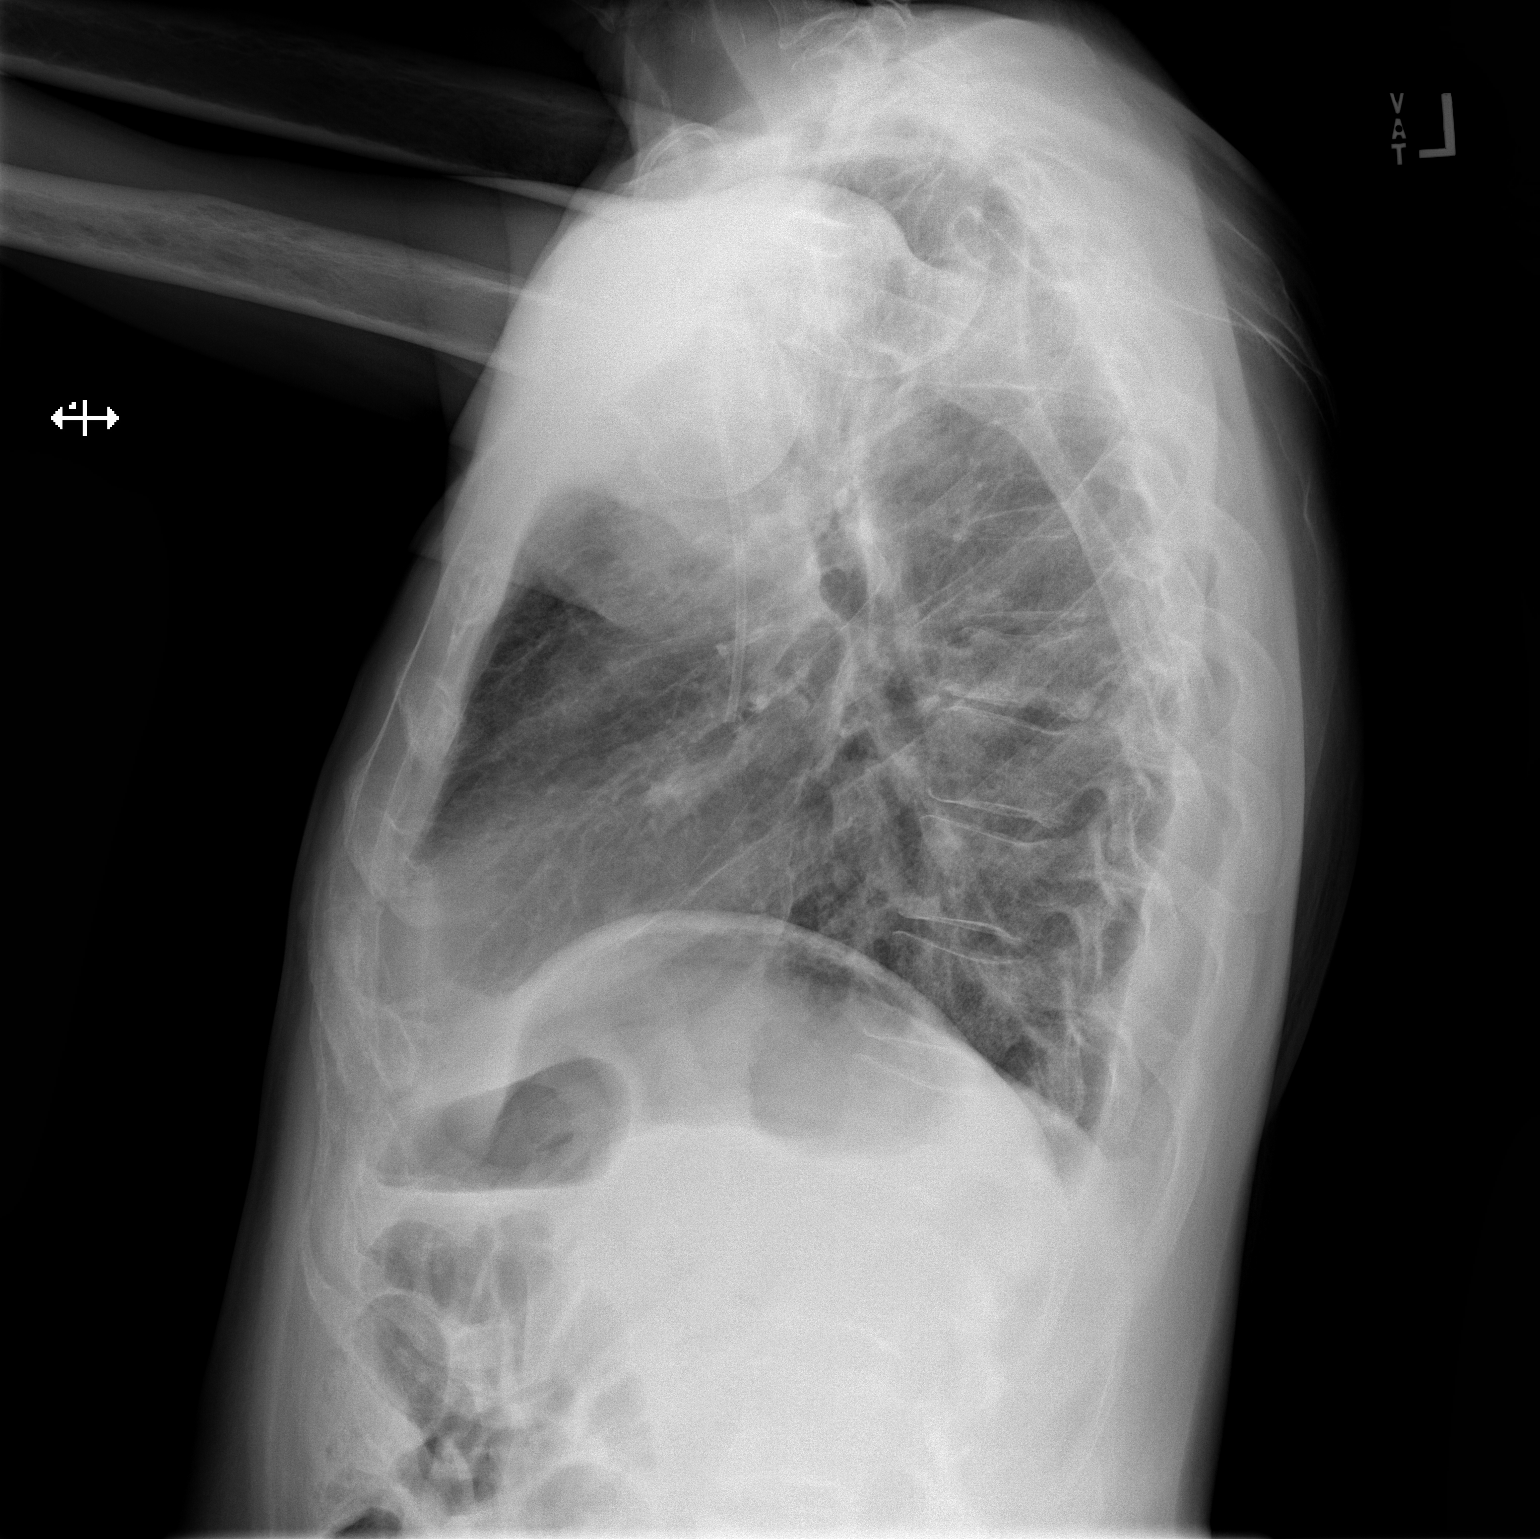

[2 of 2 positions shown; findings below may reference images not displayed]

FINDINGS: Redemonstrated right chest port with catheter tip approximating the
cavoatrial junction. Unchanged cardiac and mediastinal contours.
Previously noted right medial basilar consolidative opacity is
improved. No new focal pulmonary opacity. No pneumothorax. Possible
trace pleural effusion.
IMPRESSION: No acute cardiopulmonary process.

## 2020-10-26 IMAGING — CT CT HEAD WO/W CM
4 of 5 series · 15 of 47 positions shown, 17 images · IV contrast (omnipaque)
Comparison: None available.

CLINICAL DATA: Initial evaluation for possible metastatic disease,
history of squamous cell carcinoma at base of tongue.

EXAM:
CT HEAD WITHOUT AND WITH CONTRAST
TECHNIQUE: Contiguous axial images were obtained from the base of the skull
through the vertex without and with intravenous contrast
CONTRAST:  60mL OMNIPAQUE IOHEXOL 350 MG/ML SOLN

[Series 2: head wo · axial · 0.53mm/px · z∈[-133,-28]mm · 4 of 35 slices shown]
[im 7/35  brain]
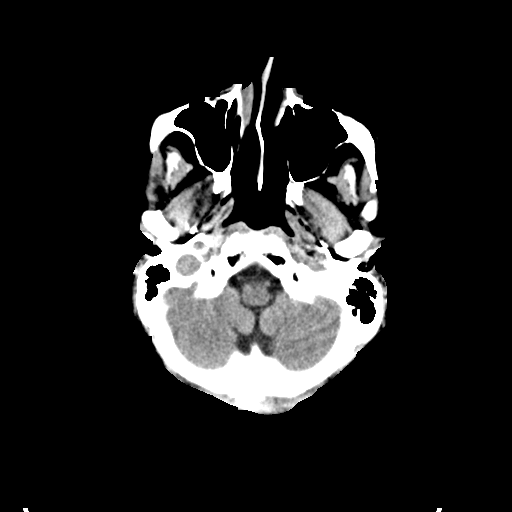
[im 14/35  brain]
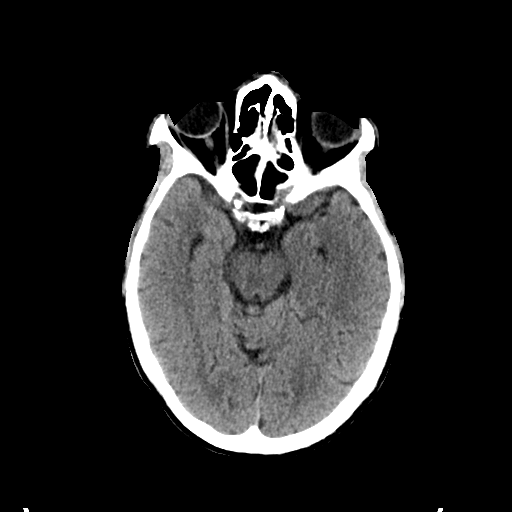
[im 21/35  brain]
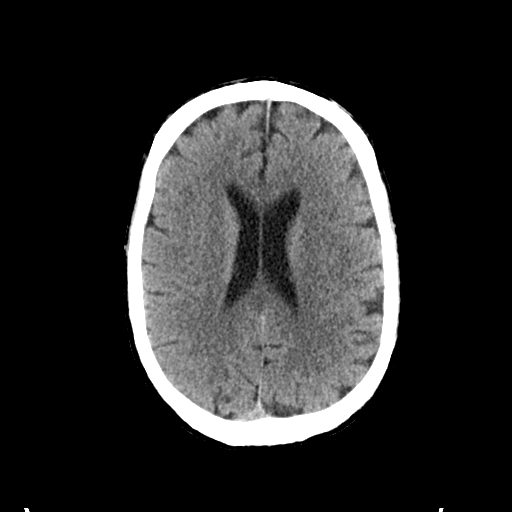
[im 28/35  brain]
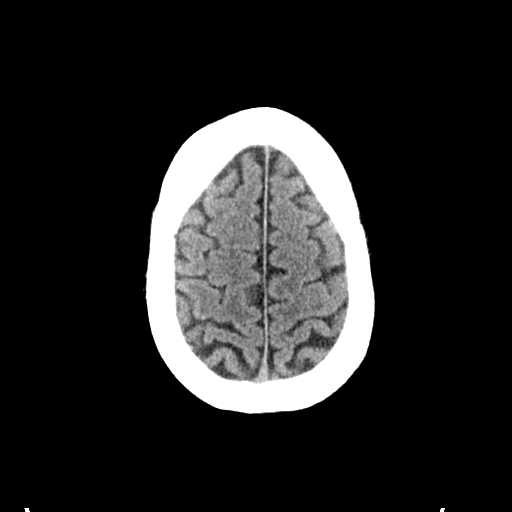

[Series 4: head w · axial · 0.53mm/px · z∈[-139,-24]mm · 5 of 35 slices shown, 7 images]
[im 6/35  brain]
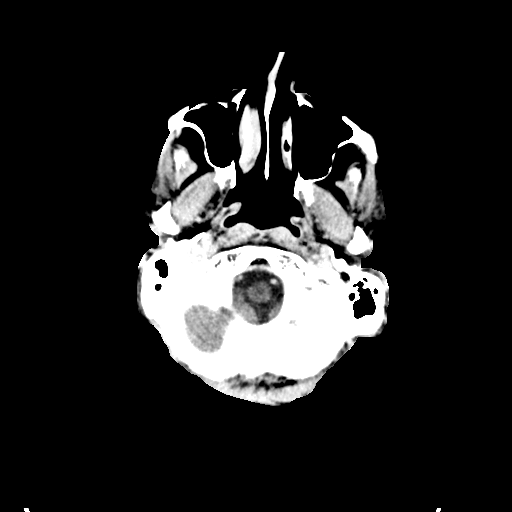
[im 6/35  bone]
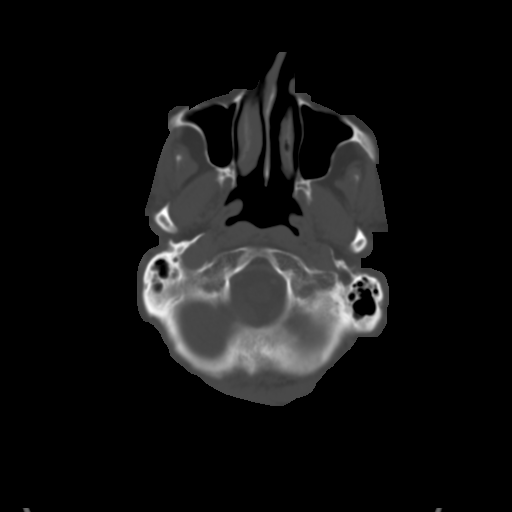
[im 12/35  brain]
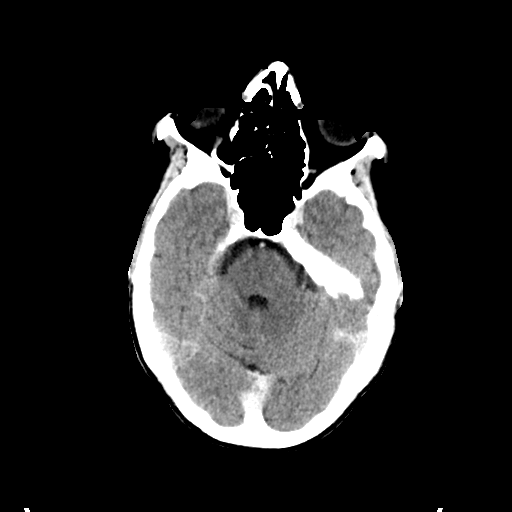
[im 18/35  brain]
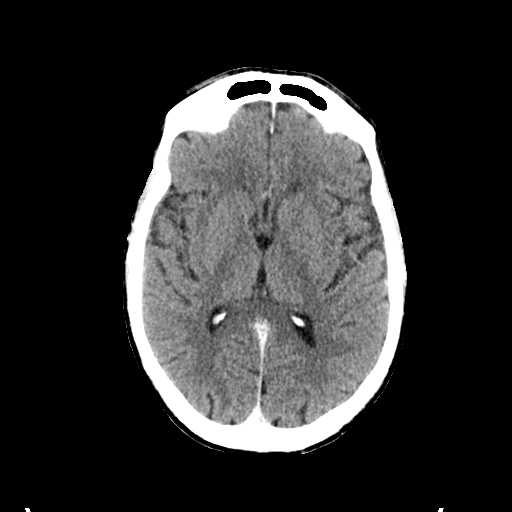
[im 23/35  brain]
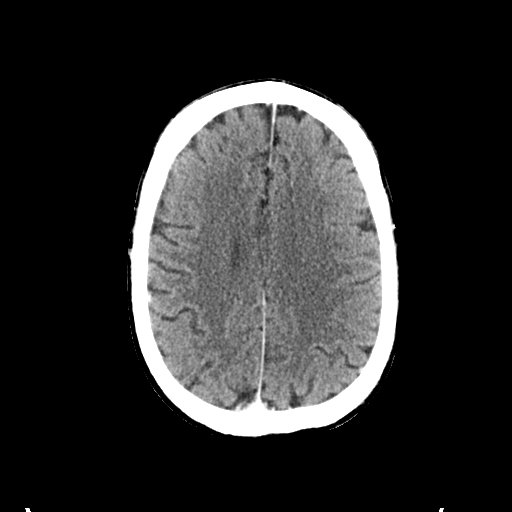
[im 29/35  brain]
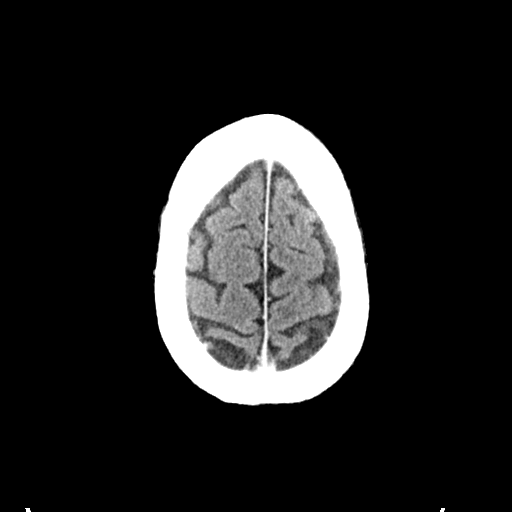
[im 29/35  bone]
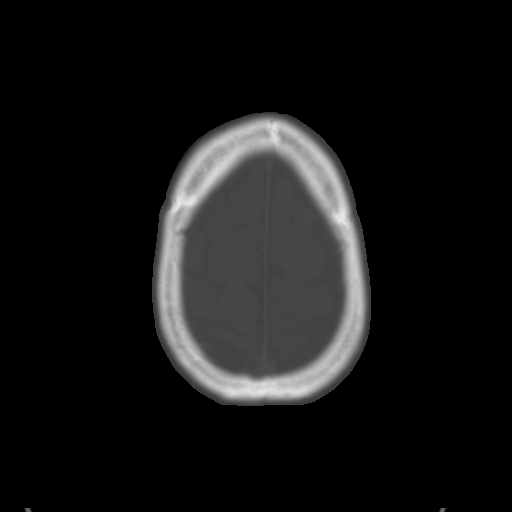

[Series 5: coronal soft tissue · coronal · 0.36mm/px · 3 of 71 slices shown]
[im 24/71  brain]
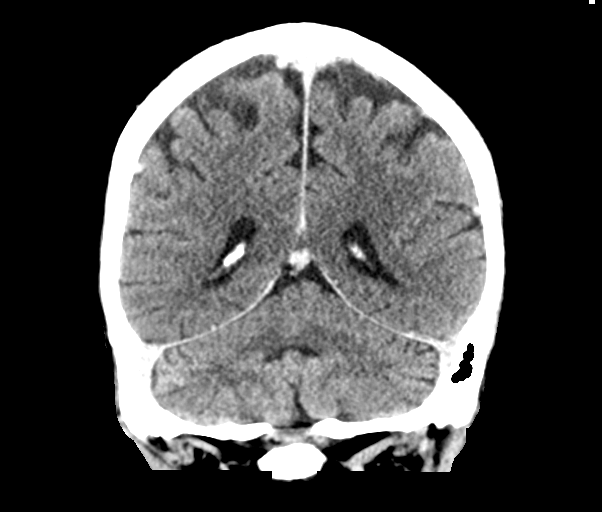
[im 32/71  brain]
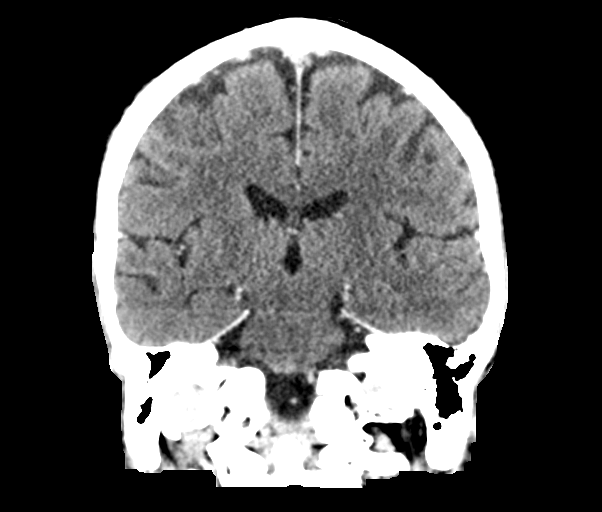
[im 39/71  brain]
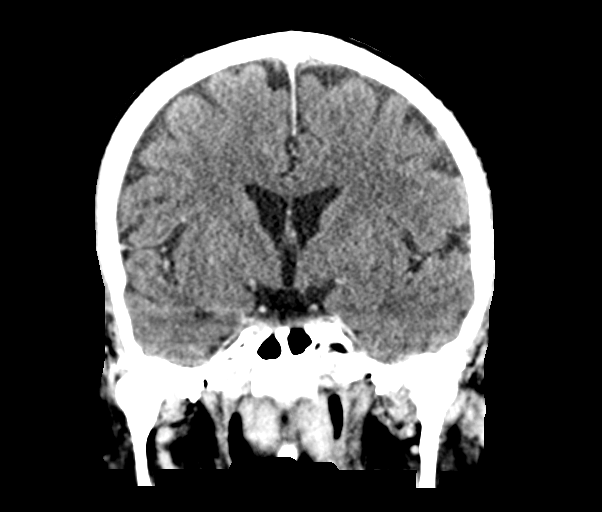

[Series 6: sagittal soft tissue · sagittal · 0.37mm/px · 3 of 53 slices shown]
[im 18/53  brain]
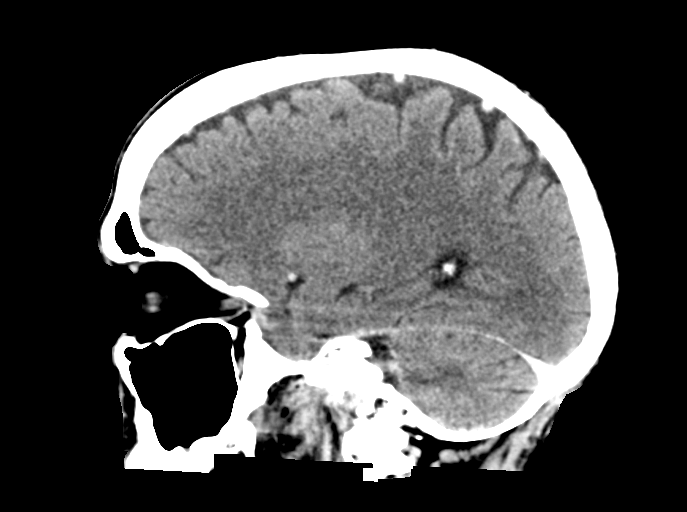
[im 27/53  brain]
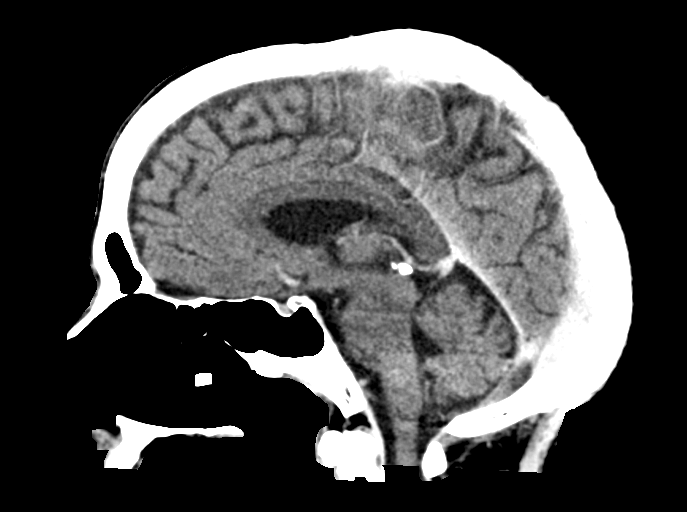
[im 35/53  brain]
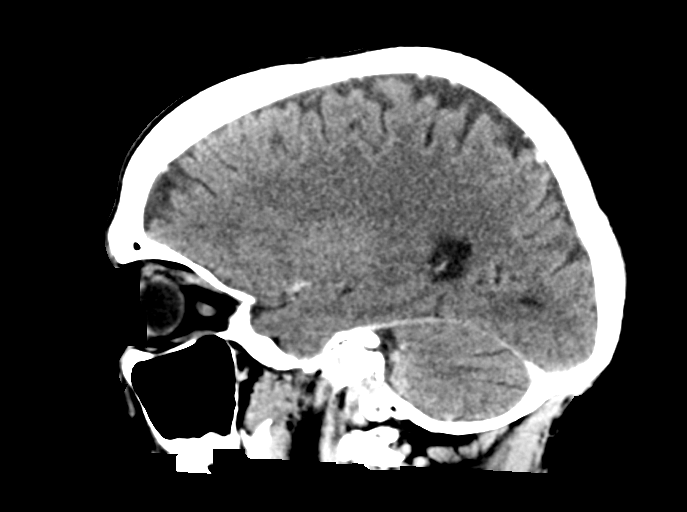

[15 of 47 positions shown; findings below may reference images not displayed]

FINDINGS: Brain: Cerebral volume within normal limits for patient age.

No evidence for acute intracranial hemorrhage. No findings to
suggest acute large vessel territory infarct. No mass lesion,
midline shift, or mass effect. Ventricles are normal in size without
evidence for hydrocephalus. No extra-axial fluid collection
identified.

Vascular: No hyperdense vessel seen prior to contrast
administration. Normal intravascular enhancement seen throughout the
major arterial and venous structures following contrast
administration.

Skull: Scalp soft tissues demonstrate no acute abnormality.
Calvarium intact.

Sinuses/Orbits: Globes and orbital soft tissues within normal
limits.

Visualized paranasal sinuses are clear. No mastoid effusion.
IMPRESSION: Normal head CT. No evidence for intracranial hemorrhage or
metastatic disease.

## 2020-10-26 MED ORDER — ONDANSETRON 4 MG PO TBDP: 8.0000 mg | ORAL_TABLET | Freq: Three times a day (TID) | ORAL | Status: DC | PRN

## 2020-10-26 MED ORDER — DIPHENHYDRAMINE HCL 25 MG PO CAPS: 25.0000 mg | ORAL_CAPSULE | Freq: Every evening | ORAL | Status: DC | PRN

## 2020-10-26 MED ORDER — IOHEXOL 350 MG/ML SOLN
60.0000 mL | Freq: Once | INTRAVENOUS | Status: AC | PRN
  Administered 2020-10-26: 60 mL via INTRAVENOUS

## 2020-10-26 MED ORDER — POLYETHYLENE GLYCOL 3350 17 G PO PACK
17.0000 g | PACK | Freq: Every day | ORAL | Status: DC
  Administered 2020-10-26 – 2020-10-30 (×4): 17 g via ORAL
  Filled 2020-10-26 (×5): qty 1

## 2020-10-26 MED ORDER — GABAPENTIN 300 MG PO CAPS
900.0000 mg | ORAL_CAPSULE | Freq: Two times a day (BID) | ORAL | Status: DC
  Filled 2020-10-26 (×5): qty 3

## 2020-10-26 MED ORDER — LIDOCAINE 5 % EX PTCH
1.0000 | MEDICATED_PATCH | Freq: Every day | CUTANEOUS | Status: DC | PRN
  Filled 2020-10-26: qty 1

## 2020-10-26 MED ORDER — SENNA 8.6 MG PO TABS
1.0000 | ORAL_TABLET | Freq: Two times a day (BID) | ORAL | Status: DC
  Administered 2020-10-26 – 2020-10-30 (×4): 8.6 mg via ORAL
  Filled 2020-10-26 (×6): qty 1

## 2020-10-26 MED ORDER — SODIUM CHLORIDE 0.9 % IV BOLUS
1000.0000 mL | Freq: Once | INTRAVENOUS | Status: AC
  Administered 2020-10-26: 1000 mL via INTRAVENOUS

## 2020-10-26 MED ORDER — LACTATED RINGERS IV SOLN: INTRAVENOUS | Status: DC

## 2020-10-26 MED ORDER — HEPARIN (PORCINE) 25000 UT/250ML-% IV SOLN
1350.0000 [IU]/h | INTRAVENOUS | Status: AC
  Administered 2020-10-26: 1100 [IU]/h via INTRAVENOUS
  Administered 2020-10-27: 1350 [IU]/h via INTRAVENOUS
  Filled 2020-10-26 (×3): qty 250

## 2020-10-26 MED ORDER — ALBUTEROL SULFATE (2.5 MG/3ML) 0.083% IN NEBU: 3.0000 mL | INHALATION_SOLUTION | Freq: Four times a day (QID) | RESPIRATORY_TRACT | Status: DC | PRN

## 2020-10-26 MED ORDER — INSULIN ASPART 100 UNIT/ML IJ SOLN
0.0000 [IU] | Freq: Three times a day (TID) | INTRAMUSCULAR | Status: DC
  Administered 2020-10-27: 2 [IU] via SUBCUTANEOUS
  Administered 2020-10-27: 1 [IU] via SUBCUTANEOUS
  Administered 2020-10-28: 2 [IU] via SUBCUTANEOUS
  Administered 2020-10-28: 1 [IU] via SUBCUTANEOUS
  Administered 2020-10-28 – 2020-10-29 (×2): 3 [IU] via SUBCUTANEOUS
  Administered 2020-10-29: 1 [IU] via SUBCUTANEOUS
  Administered 2020-10-29: 5 [IU] via SUBCUTANEOUS
  Administered 2020-10-30: 1 [IU] via SUBCUTANEOUS

## 2020-10-26 MED ORDER — ACETAMINOPHEN 325 MG PO TABS: 650.0000 mg | ORAL_TABLET | Freq: Four times a day (QID) | ORAL | Status: DC | PRN

## 2020-10-26 MED ORDER — HEPARIN BOLUS VIA INFUSION
4000.0000 [IU] | Freq: Once | INTRAVENOUS | Status: AC
  Administered 2020-10-26: 4000 [IU] via INTRAVENOUS
  Filled 2020-10-26: qty 4000

## 2020-10-26 MED ORDER — OXYCODONE HCL 5 MG PO TABS: 5.0000 mg | ORAL_TABLET | ORAL | Status: DC | PRN

## 2020-10-26 MED ORDER — BISACODYL 10 MG RE SUPP: 10.0000 mg | Freq: Every day | RECTAL | Status: DC | PRN

## 2020-10-26 MED ORDER — IOHEXOL 350 MG/ML SOLN
80.0000 mL | Freq: Once | INTRAVENOUS | Status: AC | PRN
  Administered 2020-10-26: 80 mL via INTRAVENOUS

## 2020-10-26 MED ORDER — MEGESTROL ACETATE 400 MG/10ML PO SUSP
800.0000 mg | Freq: Every day | ORAL | Status: DC
  Administered 2020-10-28 – 2020-10-29 (×2): 800 mg via ORAL
  Filled 2020-10-26 (×4): qty 20

## 2020-10-26 MED ORDER — OXYCODONE HCL 5 MG PO TABS: 5.0000 mg | ORAL_TABLET | Freq: Every day | ORAL | Status: DC

## 2020-10-26 MED ORDER — MIRTAZAPINE 15 MG PO TABS
30.0000 mg | ORAL_TABLET | Freq: Every day | ORAL | Status: DC
  Administered 2020-10-26 – 2020-10-29 (×4): 30 mg via ORAL
  Filled 2020-10-26 (×4): qty 2

## 2020-10-26 MED ORDER — CHLORHEXIDINE GLUCONATE CLOTH 2 % EX PADS
6.0000 | MEDICATED_PAD | Freq: Every day | CUTANEOUS | Status: DC
  Administered 2020-10-27 – 2020-10-29 (×4): 6 via TOPICAL

## 2020-10-26 MED ORDER — INSULIN ASPART 100 UNIT/ML IJ SOLN
0.0000 [IU] | Freq: Every day | INTRAMUSCULAR | Status: DC
  Administered 2020-10-26: 3 [IU] via SUBCUTANEOUS
  Administered 2020-10-27: 2 [IU] via SUBCUTANEOUS
  Administered 2020-10-28: 3 [IU] via SUBCUTANEOUS

## 2020-10-26 MED ORDER — POLYETHYLENE GLYCOL 3350 17 G PO PACK: 17.0000 g | PACK | Freq: Every day | ORAL | Status: DC | PRN

## 2020-10-26 MED ORDER — ENSURE ENLIVE PO LIQD
237.0000 mL | Freq: Three times a day (TID) | ORAL | Status: DC
  Administered 2020-10-27 – 2020-10-29 (×4): 237 mL via ORAL

## 2020-10-26 MED ORDER — SODIUM CHLORIDE 0.9% FLUSH
10.0000 mL | INTRAVENOUS | Status: DC | PRN
  Administered 2020-10-30: 10 mL

## 2020-10-26 NOTE — ED Notes (Signed)
Provider at the bedside to evaluate. 

## 2020-10-26 NOTE — Consult Note (Signed)
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-base of tongue cancer diagnosed in 2000-currently on chemotherapy Came into the hospital with shortness of breath, chest discomfort, lightheadedness which started abruptly at work Cough Recently treated for pneumonia Undergoing evaluation for persistent lung nodule for possible bronchoscopy-concern for metastatic disease to the lungs versus primary lung cancer   Pertinent  Medical History   Past Medical History:  Diagnosis Date   Diabetes mellitus without complication ([place])    Malignant neoplasm of base of tongue ([place]) 06/02/2018   Significant Hospital Events: Including procedures, antibiotic start and stop dates in addition to other pertinent events   Shortness of breath CT significant for large volume clot left lung  Interim History / Subjective:  Feels a little bit more comfortable but desaturates easily with any activity  Objective   Blood pressure 109/72, pulse (!) 115, temperature 98.4 F (36.9 C), temperature source Oral, resp. rate (!) 27, height $RemoveBe'5\' 10"'uIiAFtibx$  (1.778 m), weight 67.1 kg, SpO2 93 %.       No intake or output data in the 24 hours ending 10/26/20 1730 Filed Weights   10/26/20 1334 10/26/20 1436  Weight: 68 kg 67.1 kg    Examination: General: Middle-aged, frail HENT: Moist oral mucosa Lungs: Clear breath sounds bilaterally Cardiovascular: S1-S2 appreciated Abdomen: Bowel sounds appreciated Extremities: No clubbing, no edema Neuro: Alert and oriented x3 GU: [facility] Problem list     Assessment & Plan:  Pulmonary embolism Head and neck cancer Metastatic base of tongue cancer Recent pneumonia Protein calorie malnutrition Probable lung cancer  MRI  brain 06/13/2020-no mention of possible brain metastasis  Submassive PE with large clot burden, evidence of right-sided heart strain, troponin leak may portend poor prognosis  I did discuss with IR about potential for thrombolysis-catheter directed thrombolysis IR will evaluate patient  Currently continue heparin  Stat echocardiogram ordered  Best Practice (right click and "Reselect all SmartList Selections" daily)   Diet/type: Regular consistency (see orders) DVT prophylaxis: systemic heparin GI prophylaxis: N/A Lines: N/A Foley:  N/A Code Status:  full code Last date of multidisciplinary goals of care discussion [pending]  Labs   CBC: Recent Labs  Lab 10/20/20 1147 10/26/20 1439  WBC 4.5 9.2  NEUTROABS 3.0 7.5  HGB 8.5* 11.0*  HCT 25.4* 34.1*  MCV 98.4 102.1*  PLT 194 323    Basic Metabolic Panel: Recent Labs  Lab 10/20/20 1147 10/26/20 1439  NA 139 136  K 3.5 3.5  CL 105 97*  CO2 25 26  GLUCOSE 88 211*  BUN 9 11  CREATININE 0.58* 0.59*  CALCIUM 9.2 9.3   GFR: Estimated Creatinine Clearance: 103.7 mL/min (A) (by C-G formula based on SCr of 0.59 mg/dL (L)). Recent Labs  Lab 10/20/20 1147 10/26/20 1439  WBC 4.5 9.2    Liver Function Tests: Recent Labs  Lab 10/20/20 1147  AST 16  ALT 11  ALKPHOS 115  BILITOT 0.3  PROT 6.6  ALBUMIN 3.6   No results for input(s): LIPASE, AMYLASE in the last 168 hours. No results for input(s): AMMONIA in the last 168 hours.  ABG No results found for: PHART, PCO2ART, PO2ART, HCO3, TCO2, ACIDBASEDEF, O2SAT   Coagulation Profile: No results for input(s): INR, PROTIME in the last  168 hours.  Cardiac Enzymes: No results for input(s): CKTOTAL, CKMB, CKMBINDEX, TROPONINI in the last 168 hours.  HbA1C: Hgb A1c MFr Bld  Date/Time Value Ref Range Status  10/05/2020 09:47 AM 8.5 (H) 4.8 - 5.6 % Final    Comment:    (NOTE) Pre diabetes:          5.7%-6.4%  Diabetes:              >6.4%  Glycemic control for    <7.0% adults with diabetes   09/18/2020 12:40 AM 7.7 (H) 4.8 - 5.6 % Final    Comment:    (NOTE) Pre diabetes:          5.7%-6.4%  Diabetes:              >6.4%  Glycemic control for   <7.0% adults with diabetes     CBG: No results for input(s): GLUCAP in the last 168 hours.  Review of Systems:   Sob, chest discomfort  Past Medical History:  He,  has a past medical history of Diabetes mellitus without complication ([place]) and Malignant neoplasm of base of tongue ([place]) (06/02/2018).   Surgical History:   Past Surgical History:  Procedure Laterality Date   IR IMAGING GUIDED PORT INSERTION  07/15/2020   left neck dissection Left 05/04/2019   Left Neck Dissection by Dr. [name] at [facility].    neck sugery     as a child "had a knot removed from neck" not sure which side.    RIght neck dissection Right 06/16/2018   TORS and right neck dissection. Dr. [name] at [facility]     Social History:   reports that he has never smoked. He has never used smokeless tobacco. He reports current alcohol use. He reports that he does not use drugs.   Family History:  His family history is not on file.   Allergies Allergies  Allergen Reactions   Penicillins Other (See Comments)    Unknown reaction- from childhood?     Home Medications  Prior to Admission medications   Medication Sig Start Date End Date Taking? Authorizing Provider  albuterol (VENTOLIN HFA) 108 (90 Base) MCG/ACT inhaler Inhale 2 puffs into the lungs every 6 (six) hours as needed for wheezing or shortness of breath. 10/07/20  Yes Irene Pap N, DO  diphenhydrAMINE (BENADRYL) 25 MG tablet Take 1 tablet (25 mg total) by mouth at bedtime. 09/23/20  Yes Florencia Reasons, MD  gabapentin (NEURONTIN) 300 MG capsule Take 900 mg by mouth 2 (two) times daily.   Yes [provider]  glipiZIDE (GLUCOTROL) 5 MG tablet Take 0.5 tablets (2.5 mg total) by mouth daily before breakfast. 10/08/20 01/06/21 Yes Kayleen Memos, DO  lactose free  nutrition (BOOST) LIQD Take 237 mLs by mouth 3 (three) times daily between meals.   Yes [provider]  lidocaine (LIDODERM) 5 % Place 1 patch onto the skin daily. Remove & Discard patch within 12 hours or as directed by MD Patient taking differently: Place 1 patch onto the skin daily as needed (for pain- Remove & Discard patch within 12 hours or as directed by MD). 10/13/20  Yes [name], [name], MD  lidocaine-prilocaine (EMLA) cream Apply to affected area once Patient taking differently: Apply 1 application topically as directed. 07/12/20  Yes Benay Pike, MD  magic mouthwash SOLN Take 15 mLs by mouth 3 (three) times daily.   Yes [provider]  megestrol (MEGACE) 400 MG/10ML suspension Take 20 mLs (800 mg total)  by mouth daily. Patient taking differently: Take 400-480 mg by mouth in the morning. 10/07/20  Yes Kayleen Memos, DO  metFORMIN (GLUCOPHAGE) 500 MG tablet Take 1 tablet (500 mg total) by mouth 2 (two) times daily with a meal. 10/07/20 01/05/21 Yes Hall, Carole N, DO  mirtazapine (REMERON) 15 MG tablet Take 2 tablets (30 mg total) by mouth at bedtime. 09/14/20  Yes Benay Pike, MD  omeprazole (PRILOSEC) 40 MG capsule Take 40 mg by mouth daily as needed (for heartburn).   Yes [provider]  ondansetron (ZOFRAN ODT) 8 MG disintegrating tablet Take 1 tablet (8 mg total) by mouth every 8 (eight) hours as needed for nausea or vomiting. Patient taking differently: Take 8 mg by mouth every 8 (eight) hours as needed for nausea or vomiting (dissolve orally). 05/06/20  Yes [name], MD  oxyCODONE (OXY IR/ROXICODONE) 5 MG immediate release tablet Take 5 mg by mouth at bedtime.   Yes [provider]  polyethylene glycol (MIRALAX / GLYCOLAX) 17 g packet Take 17 g by mouth daily.   Yes [provider]  prochlorperazine (COMPAZINE) 10 MG tablet Take 10 mg by mouth every 6 (six) hours as needed for nausea or vomiting.   Yes [provider]   blood glucose meter kit and supplies KIT Dispense based on patient and insurance preference. Use up to four times daily as directed. 09/23/20   Florencia Reasons, MD  Insulin Pen Needle 29G X 5MM MISC For sliding scale insulin injection 09/23/20   Florencia Reasons, MD  megestrol (MEGACE ES) 625 MG/5ML suspension Take 5 mLs (625 mg total) by mouth daily. Patient not taking: Reported on 10/26/2020 07/22/20   Benay Pike, MD  Multiple Vitamin (MULTIVITAMIN WITH MINERALS) TABS tablet Take 1 tablet by mouth daily. Patient not taking: Reported on 10/26/2020 10/07/20 01/05/21  Kayleen Memos, DO  insulin aspart (NOVOLOG) 100 UNIT/ML FlexPen Before each meal 3 times a day, 140-199 - 2 units, 200-250 - 4 units, 251-299 - 6 units,  300-349 - 8 units,  350 or above 10 units. Insulin PEN if approved, provide syringes and needles if needed. Patient not taking: No sig reported 09/23/20 10/03/20  Florencia Reasons, MD     Critical care time: 32 min

## 2020-10-26 NOTE — ED Triage Notes (Addendum)
Shob, fatigue, non-productive cough x1 day. Pt reports home spo2 was 70% on RA

## 2020-10-26 NOTE — ED Notes (Signed)
Report sent to floor nurse.  

## 2020-10-26 NOTE — ED Notes (Signed)
Patient requesting lab draw from port. 

## 2020-10-26 NOTE — ED Notes (Signed)
Patient transported to CT 

## 2020-10-26 NOTE — Consult Note (Signed)
Chief Complaint: SOB  Referring Physician(s): Dr. Virl Diamond  Supervising Physician: Gilmer Mor  Patient Status: [facility] - In-pt  History of Present Illness: Albert Flowers is a 50 y.o. male presenting today to the [facility] ED with worsening SOB, and discovery of pulmonary embolism.   VIR was contacted to evaluate for possible catheter directed thrombolysis/thrombectomy.    Albert Flowers tells me that he has had some SOB now for a few days, and today it worsened to the point that he felt like he almost passed out at work.  He came to the ED for evaluation.  He reports that he took O2 sat at home which was as low as in the 70's on room air.   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 Hezzie Bump at [facility], with chemotherapy and radiation.  His last PET available for [place] to see 09/15/20 reports mixed response to therapy, with skeletal, hepatic, and lung mets.  He has no brain imaging in our system.    He denies any recent stroke or major surgery.    He denies any blood in his stool, no hematemesis, and no history of ulcer disease.    Heparin has been started at this time, and although he is resting comfortably, he tells me that he does not feel much better than earlier.   Vital signs: SBP 104 - 110 HR 93 -122 O2 89 - 100 on 2L  Pertinent labs: Trop I: 59 BNP: 45.2 Cr:  0.59 BUN: 11 GFR>60  CTA shows mostly left sided PE from the left main into segmental/subsegmental, and right sided into the segmental/subsegmental.  The RV/LV ratio is estimated 1.2     Past Medical History:  Diagnosis Date   Diabetes mellitus without complication (HCC)    Malignant neoplasm of base of tongue (HCC) 06/02/2018    Past Surgical History:  Procedure Laterality Date   IR IMAGING GUIDED PORT INSERTION  07/15/2020   left neck dissection Left 05/04/2019   Left Neck Dissection by Dr. Hezzie Bump at [facility].    neck sugery     as a child "had a  knot removed from neck" not sure which side.    RIght neck dissection Right 06/16/2018   TORS and right neck dissection. Dr. Hezzie Bump at [facility]    Allergies: Penicillins  Medications: Prior to Admission medications   Medication Sig Start Date End Date Taking? Authorizing Provider  albuterol (VENTOLIN HFA) 108 (90 Base) MCG/ACT inhaler Inhale 2 puffs into the lungs every 6 (six) hours as needed for wheezing or shortness of breath. 10/07/20  Yes Dow Adolph N, DO  diphenhydrAMINE (BENADRYL) 25 MG tablet Take 1 tablet (25 mg total) by mouth at bedtime. 09/23/20  Yes Albertine Grates, MD  gabapentin (NEURONTIN) 300 MG capsule Take 900 mg by mouth 2 (two) times daily.   Yes [provider]  glipiZIDE (GLUCOTROL) 5 MG tablet Take 0.5 tablets (2.5 mg total) by mouth daily before breakfast. 10/08/20 01/06/21 Yes Darlin Drop, DO  lactose free nutrition (BOOST) LIQD Take 237 mLs by mouth 3 (three) times daily between meals.   Yes [provider]  lidocaine (LIDODERM) 5 % Place 1 patch onto the skin daily. Remove & Discard patch within 12 hours or as directed by MD Patient taking differently: Place 1 patch onto the skin daily as needed (for pain- Remove & Discard patch within 12 hours or as directed by MD). 10/13/20  Yes [name], Burnice Logan,  MD  lidocaine-prilocaine (EMLA) cream Apply to affected area once Patient taking differently: Apply 1 application topically as directed. 07/12/20  Yes Benay Pike, MD  magic mouthwash SOLN Take 15 mLs by mouth 3 (three) times daily.   Yes [provider]  megestrol (MEGACE) 400 MG/10ML suspension Take 20 mLs (800 mg total) by mouth daily. Patient taking differently: Take 400-480 mg by mouth in the morning. 10/07/20  Yes Kayleen Memos, DO  metFORMIN (GLUCOPHAGE) 500 MG tablet Take 1 tablet (500 mg total) by mouth 2 (two) times daily with a meal. 10/07/20 01/05/21 Yes Hall, Carole N, DO  mirtazapine (REMERON) 15 MG tablet Take 2 tablets (30 mg total) by  mouth at bedtime. 09/14/20  Yes [name], [name], MD  ondansetron (ZOFRAN ODT) 8 MG disintegrating tablet Take 1 tablet (8 mg total) by mouth every 8 (eight) hours as needed for nausea or vomiting. Patient taking differently: Take 8 mg by mouth every 8 (eight) hours as needed for nausea or vomiting (dissolve orally). 05/06/20  Yes [name], MD  oxyCODONE (OXY IR/ROXICODONE) 5 MG immediate release tablet Take 5 mg by mouth at bedtime.   Yes [provider]  polyethylene glycol (MIRALAX / GLYCOLAX) 17 g packet Take 17 g by mouth daily.   Yes [provider]  prochlorperazine (COMPAZINE) 10 MG tablet Take 10 mg by mouth every 6 (six) hours as needed for nausea or vomiting.   Yes [provider]  blood glucose meter kit and supplies KIT Dispense based on patient and insurance preference. Use up to four times daily as directed. 09/23/20   Florencia Reasons, MD  Insulin Pen Needle 29G X 5MM MISC For sliding scale insulin injection 09/23/20   Florencia Reasons, MD  insulin aspart (NOVOLOG) 100 UNIT/ML FlexPen Before each meal 3 times a day, 140-199 - 2 units, 200-250 - 4 units, 251-299 - 6 units,  300-349 - 8 units,  350 or above 10 units. Insulin PEN if approved, provide syringes and needles if needed. Patient not taking: No sig reported 09/23/20 10/03/20  Florencia Reasons, MD  megestrol (MEGACE ES) 625 MG/5ML suspension Take 5 mLs (625 mg total) by mouth daily. Patient not taking: Reported on 10/26/2020 07/22/20 10/26/20  Benay Pike, MD     History reviewed. No pertinent family history.  Social History   Socioeconomic History   Marital status: Married    Spouse name: Not on file   Number of children: 4   Years of education: Not on file   Highest education level: Not on file  Occupational History   Not on file  Tobacco Use   Smoking status: Never   Smokeless tobacco: Never  Vaping Use   Vaping Use: Never used  Substance and Sexual Activity   Alcohol use: Yes    Comment: occasional   Drug  use: Never   Sexual activity: Not on file  Other Topics Concern   Not on file  Social History Narrative   Not on file   Social Determinants of Health   Financial Resource Strain: Not on file  Food Insecurity: Not on file  Transportation Needs: Not on file  Physical Activity: Not on file  Stress: Not on file  Social Connections: Not on file    ECOG Status: 2 - Symptomatic, <50% confined to bed  Review of Systems: A 12 point ROS discussed and pertinent positives are indicated in the HPI above.  All other systems are negative.  Review of Systems  Vital Signs: BP  104/82 (BP Location: Left Arm)   Pulse (!) 121   Temp 98.7 F (37.1 C) (Oral)   Resp (!) 28   Ht $R'5\' 10"'ty$  (1.778 m)   Wt 67.1 kg   SpO2 (!) 89%   BMI 21.24 kg/m   Physical Exam General: 50 yo male appearing stated age.  Comfortable though with some dyspnea. Bed 1405 HEENT: Atraumatic, normocephalic.  Conjugate gaze, extra-ocular motor intact. No scleral icterus or scleral injection. No lesions on external ears, nose, lips, or gums.  Oral mucosa tacky. Neck: Symmetric with no goiter enlargement.  Chest/Lungs:  Symmetric chest with inspiration/expiration.  Tachypnea.   Clear to auscultation with no wheezes, rhonchi, or rales.  Heart:  Tachycardia, with no third heart sounds appreciated. No JVD appreciated.  Abdomen:  Soft, NT/ND, with + bowel sounds.   Genito-urinary: Deferred Neurologic: Alert & Oriented to person, place, and time.   Normal affect and insight.  Appropriate questions.  Moving all 4 extremities with gross sensory intact.  Pulse Exam:  palpable right and left AT and PT. Extremities: No swelling of the LE's.  Neg Homan's sign.   Imaging: DG Chest 2 View  Result Date: 10/26/2020 CLINICAL DATA:  Shortness of breath. EXAM: CHEST - 2 VIEW COMPARISON:  10/02/2020 FINDINGS: Redemonstrated right chest port with catheter tip approximating the cavoatrial junction. Unchanged cardiac and mediastinal contours.  Previously noted right medial basilar consolidative opacity is improved. No new focal pulmonary opacity. No pneumothorax. Possible trace pleural effusion. IMPRESSION: No acute cardiopulmonary process. Electronically Signed   By: Merilyn Baba MD   On: 10/26/2020 14:13   DG Chest 2 View  Result Date: 10/02/2020 CLINICAL DATA:  Chest pain EXAM: CHEST - 2 VIEW COMPARISON:  09/27/2020 FINDINGS: The lungs are symmetrically well expanded. Small bilateral pleural effusions are present, new since prior examination. Medial right basilar consolidation is unchanged. No pneumothorax. Right internal jugular chest port is seen with its tip at the superior cavoatrial junction. Cardiac size within normal limits. Pulmonary vascularity is normal. No acute bone abnormality. IMPRESSION: Interval development of small bilateral pleural effusions. Stable right medial basilar consolidation. Electronically Signed   By: Fidela Salisbury MD   On: 10/02/2020 21:00   DG Chest 2 View  Result Date: 09/28/2020 CLINICAL DATA:  Pneumonia EXAM: CHEST - 2 VIEW COMPARISON:  09/21/2020 FINDINGS: Continued medial right lower lobe airspace opacity at the right base, unchanged since prior study. Heart is normal size. Left lung clear. No visible effusions. Right Port-A-Cath is unchanged. IMPRESSION: Continued right medial basilar masslike opacity, not significantly changed. Electronically Signed   By: Rolm Baptise M.D.   On: 09/28/2020 08:29   CT Angio Chest PE W and/or Wo Contrast  Result Date: 10/26/2020 CLINICAL DATA:  Metastatic head neck cancer. Recurrent pneumonia. Cough. Shortness of breath. EXAM: CT ANGIOGRAPHY CHEST WITH CONTRAST TECHNIQUE: Multidetector CT imaging of the chest was performed using the standard protocol during bolus administration of intravenous contrast. Multiplanar CT image reconstructions and MIPs were obtained to evaluate the vascular anatomy. CONTRAST:  25mL OMNIPAQUE IOHEXOL 350 MG/ML SOLN COMPARISON:  Plain film  10/26/2020.  CT 10/03/2020 FINDINGS: Cardiovascular: The quality of this exam for evaluation of pulmonary embolism is sufficient. There is mild motion degradation throughout. However, a large left-sided pulmonary embolism is seen within the main pulmonary artery and lobar branches. Right heart strain, as evidenced by an RV to LV ratio of 4.6/3.9= 1.2. Aortic atherosclerosis. Normal heart size, without pericardial effusion. Multivessel coronary artery atherosclerosis. Right Port-A-Cath tip  mid right atrium. Mediastinum/Nodes: No mediastinal or hilar adenopathy. Lungs/Pleura: Tiny right pleural effusion, minimally decreased. The left-sided pleural effusion has resolved. Slight improvement in central right lower lobe airspace disease. Mild superior segment right lower lobe airspace disease laterally including on 62/10, new. Multifocal patchy right upper lobe airspace disease is slightly increased. Right upper lobe 5 mm nodule on 38/10 is increased from 2 mm on the prior. A right middle lobe 1.5 cm nodule on 79/10 has minimally enlarged from 1.4 cm on the prior. Somewhat vague pleural-based right lower lobe 7 mm nodule on 54/10 is similar to minimally enlarged from 6 mm on the prior. Upper Abdomen: Motion degradation continuing into the upper abdomen. Grossly normal imaged liver, spleen, stomach, pancreas, gallbladder, adrenal glands, kidneys. Musculoskeletal: Multifocal osseous metastasis again identified, primarily sclerotic in appearance. Anterior left fourth rib osseous and soft tissue density lesion including on 81/4 is relatively similar. An expansile lesion involving the sixth anterior right rib including on 104/4 is felt to be slightly increased. Compression deformity at T8 is moderate and similar. Review of the MIP images confirms the above findings. IMPRESSION: 1. Mildly motion degraded exam. 2. Large volume left-sided pulmonary embolism with evidence of right heart strain. (RV/LV Ratio = 1.2) consistent with  at least submassive (intermediate risk) PE. The presence of right heart strain has been associated with an increased risk of morbidity and mortality. Please refer to the "PE Focused" order set in EPIC. 3. Shifting right-sided pulmonary opacities, most consistent with infection or aspiration. 4. Pulmonary nodules, felt to be slightly progressive and indicative of metastatic disease. 5. Widespread osseous metastasis, mildly progressive as above. 6. Coronary artery atherosclerosis. Aortic Atherosclerosis (ICD10-I70.0). Case was discussed with Dr Nada Libman at 4:25 pm. Electronically Signed   By: Abigail Miyamoto M.D.   On: 10/26/2020 16:59   CT Angio Chest PE W and/or Wo Contrast  Result Date: 10/03/2020 CLINICAL DATA:  Left chest pain EXAM: CT ANGIOGRAPHY CHEST WITH CONTRAST TECHNIQUE: Multidetector CT imaging of the chest was performed using the standard protocol during bolus administration of intravenous contrast. Multiplanar CT image reconstructions and MIPs were obtained to evaluate the vascular anatomy. CONTRAST:  56mL OMNIPAQUE IOHEXOL 350 MG/ML SOLN COMPARISON:  Chest radiographs dated 10/02/2020. CTA chest dated 09/17/2020. FINDINGS: Cardiovascular: Satisfactory opacification of the bilateral pulmonary arteries to the lobar level. No evidence of pulmonary embolism. Although not tailored for evaluation of the thoracic aorta, there is no evidence of thoracic aortic aneurysm or dissection. Mild atherosclerotic calcifications of the aortic arch. Heart is normal in size.  No pericardial effusion. Mild coronary atherosclerosis of the LAD. Mediastinum/Nodes: No suspicious mediastinal lymphadenopathy. Visualized thyroid is unremarkable. Lungs/Pleura: Scarring/radiation changes at the lung apices. Patchy opacity in the medial right lower lobe (series 5/image 93), less rounded/masslike on the current study, continuing to favor infection/pneumonia. Associated small bilateral pleural effusions, unchanged on the right and new  on the left. Associated mild compressive atelectasis in the left lower lobe. 14 mm right middle lobe nodule (series 6/image 82), previously 12 mm. 6 mm posterior right lower lobe nodule (series 6/image 54), previously 4 mm. These are suspicious for metastatic disease when correlating with prior PET. Mild patchy/ground-glass opacities in the right upper lobe (series 6/image 68), suspicious for additional infection/pneumonia, new. Mild vague ground-glass opacity in the left lower lobe (series 6/image 88), equivocal. Debris in the trachea (series 6/image 31), likely reflecting secretions. No pneumothorax. Upper Abdomen: Visualized upper abdomen is grossly unremarkable. Musculoskeletal: Multifocal osseous metastases involving the left sternum,  multiple ribs, multiple thoracic vertebral bodies, and the right proximal humerus, grossly unchanged. However, the soft tissue component along the left anterior 4th rib is more conspicuous on the current study, suggesting mild progression (series 5/image 84). Review of the MIP images confirms the above findings. IMPRESSION: No evidence of pulmonary embolism. Patchy right lower lobe opacity remains suspicious for pneumonia. Additional mild right upper lobe infection/pneumonia. Small bilateral pleural effusions, progressive on the left. Two right lung nodules are mildly progressive from recent CT, suspicious for metastatic disease. Mildly progressive soft tissue component associated with left anterior 4th rib metastasis. Additional multifocal osseous metastases are unchanged. Aortic Atherosclerosis (ICD10-I70.0). Electronically Signed   By: Julian Hy M.D.   On: 10/03/2020 03:04    Labs:  CBC: Recent Labs    10/06/20 0417 10/13/20 1408 10/20/20 1147 10/26/20 1439  WBC 3.3* 4.0 4.5 9.2  HGB 8.0* 8.4* 8.5* 11.0*  HCT 24.5* 24.9* 25.4* 34.1*  PLT 120* 188 194 208    COAGS: Recent Labs    05/12/20 1147 09/17/20 1346  INR 0.9 1.2  APTT  --  32     BMP: Recent Labs    11/12/19 1144 05/11/20 1201 10/06/20 0417 10/13/20 1408 10/20/20 1147 10/26/20 1439  NA  --    < > 135 135 139 136  K  --    < > 4.3 3.9 3.5 3.5  CL  --    < > 103 101 105 97*  CO2  --    < > $R'24 26 25 26  'Qi$ GLUCOSE  --    < > 217* 179* 88 211*  BUN 9   < > $R'7 13 9 11  'ek$ CALCIUM  --    < > 8.7* 9.5 9.2 9.3  CREATININE 0.84   < > 0.48* 0.73 0.58* 0.59*  GFRNONAA >60   < > >60 >60 >60 >60  GFRAA >60  --   --   --   --   --    < > = values in this interval not displayed.    LIVER FUNCTION TESTS: Recent Labs    10/03/20 0848 10/05/20 0947 10/13/20 1408 10/20/20 1147  BILITOT 0.3 0.4 <0.2* 0.3  AST $Re'21 17 18 16  'Rqy$ ALT $R'21 20 25 11  'Vi$ ALKPHOS 110 117 123 115  PROT 6.1* 6.0* 7.4 6.6  ALBUMIN 2.4* 2.5* 3.3* 3.6    TUMOR MARKERS: No results for input(s): AFPTM, CEA, CA199, CHROMGRNA in the last 8760 hours.  Assessment and Plan:  Albert Flowers is a very pleasant 50 yo male with metastatic H&N carcinoma, previously treated, with new pulmonary embolism.   Given his vitals, positive biomarkers, and CT images revealing RV/LV or 1.2, he is submassive category PE.    He is currently on heparin ggt, comfortable in bed.   I had a lengthy discussion with Albert. Flowers and his wife regarding the pathology/pathophysiology and natural history of PE/VTE.  This included a discussion of the standard of care, anticoagulation, which he currently on, and any further treatments which historically were surgical thrombectomy and now include either IV thrombolysis or catheter thrombolysis.    I further discussed the logistics and theory behind catheter directed thrombolysis/thrombectomy, and the literature that supports its effectiveness with reducing the right sided heart pressure by decreasing the clot burden.  Secondarily effects would be decreasing the short term morbidity/mortality, and the intermediate to long term effects of developing symptoms of CTEPH.    Risks include  bleeding at access site, infection,  injury to local anatomy, pulmonary hemorrhage, contrast reaction, need for further surgery/intervention, renal injury, gastrointestinal bleeding, neurologic bleeding, cardiopulmonary collapse, death.    Given that he has known metastatic disease, I think it is only prudent to acquire some form of brain imaging.  I think a head CT with and without will be adequate to rule out any possible problem that might change our plan.  I did inform them of this.   Should the head CT be clear, I did let them know that we are recommending catheter directed lysis for treatment.  Albert Bannister and his wife would like to discuss by themselves, agree with the head CT, and are leaning towards treatment.   Plan: - agree with current care - Agree with cardiac echo, pending.  - Head CT without and with contrast, given his history of brain mets and our intention for tPA catheter directed lysis.   Other catheter directed options are possible but would require transfer to [facility].  We will order this - If head CT negative, and the patient and his family would like to proceed, tentative plan would be for catheter directed lysis/treatment. Patient would then need ICU bed escalation for observation during lysis - NPO past midnight  - VIR available on call should any immediate change  Thank you for this interesting consult.  I greatly enjoyed meeting Albert Flowers and look forward to participating in their care.  A copy of this report was sent to the requesting provider on this date.  Electronically Signed: Corrie Mckusick, DO 10/26/2020, 7:31 PM   I spent a total of 80 Minutes    in face to face in clinical consultation, greater than 50% of which was counseling/coordinating care for submassive PE, possible catheter directed treatment.

## 2020-10-26 NOTE — ED Notes (Signed)
Hospitalist at the bedside 

## 2020-10-26 NOTE — ED Provider Notes (Signed)
[facility] DEPT Provider Note   CSN: [id number] Arrival date & time: 10/26/20  1324     History Chief Complaint  Patient presents with   Shortness of Breath    Ahmon Tosi is a 50 y.o. 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  The patient reports abrupt onset of shortness of breath while at work today, feeling weak cannot catch his breath, feeling his heart was racing.  He states his legs felt weak and tremulous as well.  He spoke to the oncologist to refer them back into the ED.  He does report a history of anemia requiring blood transfusions in the past, and a history of recurring pneumonia.  He was admitted to the hospital in July, proximally 1 month ago, again for treatment of pneumonia.  At that time CT scan of the chest showed a right lower lobe and upper lobe infection.  He completed a course of 5 days of IV cefepime.  He also appears to have a history of chemotherapy-induced pancytopenia per medical record review.  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    Patient Active Problem List   Diagnosis Date Noted   Rib pain on left side 10/13/2020   Protein-calorie malnutrition, severe 10/05/2020   HCAP (healthcare-associated pneumonia) 10/03/2020   SIRS (systemic inflammatory response syndrome) ([place]) 10/03/2020   History of COVID-19 10/03/2020   Abnormal CT of the chest 09/27/2020   Community acquired pneumonia of right middle lobe of lung 09/17/2020   Sepsis ([place]) 08/29/2020   Pancytopenia ([place]) 08/29/2020   Nausea & vomiting 08/29/2020   Generalized weakness 08/29/2020   Hyponatremia 08/29/2020   Dehydration 08/29/2020   Protein calorie malnutrition ([place]) 08/29/2020   Diabetes mellitus  without complication ([place])    OIZTI-45 virus infection 08/28/2020   Cough 08/24/2020   Cancer related pain 08/09/2020   Mucositis due to chemotherapy 07/27/2020   Intractable hiccoughs 07/27/2020   Weight loss, unintentional 07/27/2020   Anorexia 07/12/2020   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 ([place]) 05/09/2020   Malignant neoplasm of tonsillar fossa ([place]) 06/01/2019   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 [name] at [facility].    neck sugery     as a child "had a knot removed from neck" not sure which side.    RIght neck dissection Right 06/16/2018   TORS and right neck dissection. Dr. [name] at [facility]       History reviewed. No pertinent family history.  Social History   Tobacco Use   Smoking status: Never   Smokeless tobacco: Never  Vaping Use   Vaping Use: Never used  Substance Use Topics   Alcohol use: Yes    Comment: occasional   Drug use: Never    Home Medications Prior to Admission medications   Medication Sig Start Date End Date Taking? Authorizing Provider  albuterol (VENTOLIN HFA) 108 (90 Base) MCG/ACT inhaler Inhale 2 puffs into the lungs every 6 (six) hours as needed for wheezing or shortness of breath. 10/07/20  Yes Irene Pap N, DO  diphenhydrAMINE (BENADRYL) 25 MG tablet Take 1 tablet (25 mg total) by mouth at bedtime. 09/23/20  Yes Florencia Reasons, MD  gabapentin (NEURONTIN) 300 MG capsule Take 900 mg by mouth 2 (two) times daily.   Yes [provider]  lactose free nutrition (BOOST) LIQD Take 237 mLs by mouth 3 (three) times daily between meals.   Yes [provider]  magic mouthwash SOLN Take 15 mLs by mouth 3 (three) times daily.   Yes [provider]  polyethylene glycol (MIRALAX / GLYCOLAX) 17 g packet  Take 17 g by mouth daily.   Yes [provider]  blood glucose meter kit and supplies KIT Dispense based on patient and insurance preference. Use up to four times daily as directed. 09/23/20   Florencia Reasons, MD  glipiZIDE (GLUCOTROL) 5 MG tablet Take 0.5 tablets (2.5 mg total) by mouth daily before breakfast. 10/08/20 01/06/21  Kayleen Memos, DO  Insulin Pen Needle 29G X 5MM MISC For sliding scale insulin injection 09/23/20   Florencia Reasons, MD  lidocaine (LIDODERM) 5 % Place 1 patch onto the skin daily. Remove & Discard patch within 12 hours or as directed by MD 10/13/20   Benay Pike, MD  lidocaine-prilocaine (EMLA) cream Apply to affected area once 07/12/20   Benay Pike, MD  megestrol (MEGACE ES) 625 MG/5ML suspension Take 5 mLs (625 mg total) by mouth daily. 07/22/20   Benay Pike, MD  megestrol (MEGACE) 400 MG/10ML suspension Take 20 mLs (800 mg total) by mouth daily. 10/07/20   Kayleen Memos, DO  metFORMIN (GLUCOPHAGE) 500 MG tablet Take 1 tablet (500 mg total) by mouth 2 (two) times daily with a meal. 10/07/20 01/05/21  Kayleen Memos, DO  mirtazapine (REMERON) 15 MG tablet Take 2 tablets (30 mg total) by mouth at bedtime. 09/14/20   Benay Pike, MD  Multiple Vitamin (MULTIVITAMIN WITH MINERALS) TABS tablet Take 1 tablet by mouth daily. 10/07/20 01/05/21  Kayleen Memos, DO  ondansetron (ZOFRAN ODT) 8 MG disintegrating tablet Take 1 tablet (8 mg total) by mouth every 8 (eight) hours as needed for nausea or vomiting. 05/06/20   [name], MD  TUSSIN 100 MG/5ML liquid SMARTSIG:5 Milliliter(s) By Mouth Every 4 Hours PRN 09/23/20   [provider]  insulin aspart (NOVOLOG) 100 UNIT/ML FlexPen Before each meal 3 times a day, 140-199 - 2 units, 200-250 - 4 units, 251-299 - 6 units,  300-349 - 8 units,  350 or above 10 units. Insulin PEN if approved, provide syringes and needles if needed. Patient not taking: No sig reported 09/23/20 10/03/20  Florencia Reasons, MD    Allergies     Penicillins  Review of Systems   Review of Systems  Constitutional:  Positive for fatigue. Negative for chills and fever.  HENT:  Negative for ear pain and sore throat.   Eyes:  Negative for pain and visual disturbance.  Respiratory:  Positive for shortness of breath. Negative for cough.   Cardiovascular:  Negative for chest pain and palpitations.  Gastrointestinal:  Positive for nausea. Negative for abdominal pain and vomiting.  Genitourinary:  Negative for dysuria and hematuria.  Musculoskeletal:  Negative for arthralgias and myalgias.  Skin:  Negative for color change and rash.  Neurological:  Positive for light-headedness. Negative for syncope.  All other systems reviewed and are negative.  Physical Exam Updated Vital Signs BP 110/89   Pulse (!) 113   Temp 98.4 F (36.9 C) (Oral)  Resp (!) 32   Ht $R'5\' 10"'SQ$  (1.778 m)   Wt 67.1 kg   SpO2 94%   BMI 21.24 kg/m   Physical Exam Constitutional:      General: He is not in acute distress. HENT:     Head: Normocephalic and atraumatic.  Eyes:     Conjunctiva/sclera: Conjunctivae normal.     Pupils: Pupils are equal, round, and reactive to light.  Cardiovascular:     Rate and Rhythm: Regular rhythm. Tachycardia present.  Pulmonary:     Effort: Pulmonary effort is normal. No respiratory distress.  Abdominal:     General: There is no distension.     Tenderness: There is no abdominal tenderness.  Skin:    General: Skin is warm and dry.  Neurological:     General: No focal deficit present.     Mental Status: He is alert. Mental status is at baseline.  Psychiatric:        Mood and Affect: Mood normal.        Behavior: Behavior normal.    ED Results / Procedures / Treatments   Labs (all labs ordered are listed, but only abnormal results are displayed) Labs Reviewed  CBC WITH DIFFERENTIAL/PLATELET - Abnormal; Notable for the following components:      Result Value   RBC 3.34 (*)    Hemoglobin 11.0 (*)    HCT 34.1 (*)     MCV 102.1 (*)    RDW 17.6 (*)    Lymphs Abs 0.5 (*)    Abs Immature Granulocytes 0.48 (*)    All other components within normal limits  CULTURE, BLOOD (ROUTINE X 2)  CULTURE, BLOOD (ROUTINE X 2)  RESP PANEL BY RT-PCR (FLU A&B, COVID) ARPGX2  BASIC METABOLIC PANEL  BRAIN NATRIURETIC PEPTIDE  LACTIC ACID, PLASMA  LACTIC ACID, PLASMA  URINALYSIS, ROUTINE W REFLEX MICROSCOPIC  TYPE AND SCREEN  TROPONIN I (HIGH SENSITIVITY)  TROPONIN I (HIGH SENSITIVITY)    EKG None  Radiology DG Chest 2 View  Result Date: 10/26/2020 CLINICAL DATA:  Shortness of breath. EXAM: CHEST - 2 VIEW COMPARISON:  10/02/2020 FINDINGS: Redemonstrated right chest port with catheter tip approximating the cavoatrial junction. Unchanged cardiac and mediastinal contours. Previously noted right medial basilar consolidative opacity is improved. No new focal pulmonary opacity. No pneumothorax. Possible trace pleural effusion. IMPRESSION: No acute cardiopulmonary process. Electronically Signed   By: Merilyn Baba MD   On: 10/26/2020 14:13    Procedures .Critical Care  Date/Time: 10/26/2020 6:20 PM Performed by: Wyvonnia Dusky, MD Authorized by: Wyvonnia Dusky, MD   Critical care provider statement:    Critical care time (minutes):  42   Critical care time was exclusive of:  Separately billable procedures and treating other patients   Critical care was necessary to treat or prevent imminent or life-threatening deterioration of the following conditions:  Circulatory failure   Critical care was time spent personally by me on the following activities:  Discussions with consultants, evaluation of patient's response to treatment, examination of patient, ordering and performing treatments and interventions, ordering and review of laboratory studies, ordering and review of radiographic studies, pulse oximetry, re-evaluation of patient's condition, obtaining history from patient or surrogate and review of old charts   I  assumed direction of critical care for this patient from another provider in my specialty: no     Care discussed with: admitting provider     Medications Ordered in ED Medications  sodium chloride 0.9 % bolus 1,000  mL (1,000 mLs Intravenous New Bag/Given 10/26/20 1549)    ED Course  I have reviewed the triage vital signs and the nursing notes.  Pertinent labs & imaging results that were available during my care of the patient were reviewed by me and considered in my medical decision making (see chart for details).  This patient complains of shortness of breath, lightheadedness, weakness.  This involves an extensive number of treatment options, and is a complaint that carries with it a high risk of complications and morbidity.  The differential diagnosis includes infection including recurrent pneumonia versus anemia versus pulmonary embolism versus other.  I ordered, reviewed, and interpreted labs, showing hgb stable at 11.0 (no acute anemia), platelets wnl, WBC normal at 9.2.   Initial trop 59 (new minor elevation) with repeat pending - possibly demand ischemia from tachycardia.  ECG reviewed showing sinus tachycardia without obvious ischemia findings. I ordered medication IV fluids for tachycardia I ordered imaging studies which included CT PE I independently visualized and interpreted imaging as noted below, and the monitor tracing which showed sinus tachycardia  Additional history was obtained from patient's wife at bedside Previous records obtained and reviewed showing recent hospitalization course/oncology notes I consulted critical care and discussed lab and imaging findings  After the interventions stated above, I reevaluated the patient and found he was stable on room air, 93%, with BP stable here.     Clinical Course as of 10/26/20 1819  Wed Oct 26, 2020  1625 CT scan appears to show a large left-sided pulmonary embolism with evidence of right heart strain per my discussion with  radiology (full evaluation report is still pending).  IV heparin has been ordered. [MT]  1630 Critical care to see patient.  Patient and wife updated regarding diagnosis and need for admission. [MT]  7510 Blood pressure stable, no evidence of cardiogenic shock.  He remains at 93% on room air and breathing comfortably. [MT]  1651 I spoke to hospitalist Dr Delana Meyer who will coordinate admission following Critical care evaluation [MT]    Clinical Course User Index [MT] Tiffiny Worthy, Carola Rhine, MD    Final Clinical Impression(s) / ED Diagnoses Pulmonary embolism, acute   Rx / DC Orders ED Discharge Orders     None        Wyvonnia Dusky, MD 10/26/20 1821

## 2020-10-26 NOTE — ED Notes (Signed)
ED provider notified of pt's VS.

## 2020-10-26 NOTE — Progress Notes (Signed)
ANTICOAGULATION CONSULT NOTE - Initial Consult  Pharmacy Consult for heparin Indication: pulmonary embolus  Allergies  Allergen Reactions   Penicillins Other (See Comments)    Unknown reaction- from childhood?    Patient Measurements: Height: '5\' 10"'$  (177.8 cm) Weight: 67.1 kg (148 lb) IBW/kg (Calculated) : 73 Heparin Dosing Weight: 67.1 kg  Vital Signs: Temp: 98.4 F (36.9 C) (08/10 1436) Temp Source: Oral (08/10 1436) BP: 110/89 (08/10 1545) Pulse Rate: 113 (08/10 1545)  Labs: Recent Labs    10/26/20 1439  HGB 11.0*  HCT 34.1*  PLT 208  CREATININE 0.59*  TROPONINIHS 54*  59*    Estimated Creatinine Clearance: 103.7 mL/min (A) (by C-G formula based on SCr of 0.59 mg/dL (L)).   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  He does have a history of anemia, requiring blood transfusions in the past.  No anticoagulation noted PTA.  Pharmacy consulted to dose heparin for pulmonary embolus.  CBC stable Goal of Therapy:  Heparin level 0.3-0.7 units/ml Monitor platelets by anticoagulation protocol: Yes   Plan:  Give heparin 4000 units bolus x 1 Start heparin infusion at 1100 units/hr F/u 6 hour HL  Monitor HL and CBC daily while on heparin F/u ability to transition to oral anticoagulant  Dimple Nanas, PharmD 10/26/2020 4:47 PM

## 2020-10-26 NOTE — ED Provider Notes (Signed)
Emergency Medicine Provider Triage Evaluation Note  Albert Flowers , a 50 y.o. male  was evaluated in triage.  Pt complains of shortness of breath and mild chest pain.  Started a few hours ago, I checked his pulse ox and it was in the 70s.  States he has been feeling the week for the last few days, he is in treatment for cancer and has a port.  History of pneumonia 7/17.  Review of Systems  Positive: Shortness of breath, chest pain Negative: Fever  Physical Exam  There were no vitals taken for this visit. Gen:   Awake, no distress   Resp:  Normal effort  MSK:   Moves extremities without difficulty  Other:  Patient is tachycardic  Medical Decision Making  Medically screening exam initiated at 1:34 PM.  Appropriate orders placed.  Doyl Pellegrini was informed that the remainder of the evaluation will be completed by another provider, this initial triage assessment does not replace that evaluation, and the importance of remaining in the ED until their evaluation is complete.     Sherrill Raring, PA-C 10/26/20 1339    Wyvonnia Dusky, MD 10/26/20 [phone]

## 2020-10-26 NOTE — Telephone Encounter (Signed)
Patient's wife called stating that patient had low-grade fever, chills, weakness, and O2 sats ranging from 75-85% at home. I recommended she take patient to the ED as soon as possible to be evaluated. Patient's wife verbalized understanding and said that she would keep the office updated. Will make MD aware.

## 2020-10-26 NOTE — ED Notes (Signed)
Pharmacy notified and aware of heparin order.

## 2020-10-26 NOTE — H&P (Signed)
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  This was accompanied by palpitations/tachycardia.  On presentation his room air pulse ox was noted to be in the 70s.  CTa in the ED noted a large left-sided pulmonary embolism with evidence of right heart strain.  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 Delories Heinz    Recent recurrent pneumonias No clinical evidence of acute bacterial infection at present  Severe protein calorie malnutrition Due to metastatic cancer -nutrition to follow  Possible lung cancer Persistent lung nodule undergoing work-up with planning for possible bronchoscopy -metastatic disease versus primary lung cancer -PCCM following  DVT prophylaxis: IV heparin Code Status: Full code Family Communication: No family present at time of admission Disposition Plan:  Admit to Inpatient  Consults called: none indicated  Review of Systems: As per HPI otherwise 10 point review of systems  negative.   Past Medical History:  Diagnosis Date   Diabetes mellitus without complication ([place])    Malignant neoplasm of base of tongue ([place]) 06/02/2018    Past Surgical History:  Procedure Laterality Date   IR IMAGING GUIDED PORT INSERTION  07/15/2020   left neck dissection Left 05/04/2019   Left Neck Dissection by Dr. [name] at [facility].    neck sugery     as a child "had a knot removed from neck" not sure which side.    RIght neck dissection Right 06/16/2018   TORS and right neck dissection. Dr. [name] at [facility]    Family History  History reviewed. No pertinent family history.  Social History   reports that he has never smoked. He has never used smokeless tobacco. He reports current alcohol use. He reports that he does not use drugs.  Allergies Allergies  Allergen Reactions   Penicillins Other (See Comments)    Unknown reaction- from childhood?    Prior to Admission medications   Medication Sig Start Date End Date Taking? Authorizing Provider  albuterol (VENTOLIN HFA) 108 (90 Base) MCG/ACT inhaler Inhale 2 puffs into the lungs every 6 (six) hours as needed for wheezing or shortness of breath. 10/07/20  Yes Irene Pap N, DO  diphenhydrAMINE (BENADRYL) 25 MG tablet Take 1 tablet (25 mg total) by mouth at bedtime. 09/23/20  Yes Florencia Reasons, MD  gabapentin (NEURONTIN) 300 MG capsule Take 900 mg by mouth 2 (two) times daily.   Yes [provider]  glipiZIDE (GLUCOTROL) 5 MG tablet Take 0.5 tablets (2.5 mg total) by mouth daily before breakfast. 10/08/20 01/06/21 Yes Kayleen Memos, DO  lactose free nutrition (BOOST) LIQD Take 237 mLs by mouth 3 (three) times daily between meals.  Yes [provider]  lidocaine (LIDODERM) 5 % Place 1 patch onto the skin daily. Remove & Discard patch within 12 hours or as directed by MD Patient taking differently: Place 1 patch onto the skin daily as needed (for pain- Remove & Discard patch within 12 hours or as directed by MD). 10/13/20   Yes [name], [name], MD  lidocaine-prilocaine (EMLA) cream Apply to affected area once Patient taking differently: Apply 1 application topically as directed. 07/12/20  Yes Benay Pike, MD  magic mouthwash SOLN Take 15 mLs by mouth 3 (three) times daily.   Yes [provider]  megestrol (MEGACE) 400 MG/10ML suspension Take 20 mLs (800 mg total) by mouth daily. Patient taking differently: Take 400-480 mg by mouth in the morning. 10/07/20  Yes Kayleen Memos, DO  metFORMIN (GLUCOPHAGE) 500 MG tablet Take 1 tablet (500 mg total) by mouth 2 (two) times daily with a meal. 10/07/20 01/05/21 Yes Hall, Carole N, DO  mirtazapine (REMERON) 15 MG tablet Take 2 tablets (30 mg total) by mouth at bedtime. 09/14/20  Yes Benay Pike, MD  omeprazole (PRILOSEC) 40 MG capsule Take 40 mg by mouth daily as needed (for heartburn).   Yes [provider]  ondansetron (ZOFRAN ODT) 8 MG disintegrating tablet Take 1 tablet (8 mg total) by mouth every 8 (eight) hours as needed for nausea or vomiting. Patient taking differently: Take 8 mg by mouth every 8 (eight) hours as needed for nausea or vomiting (dissolve orally). 05/06/20  Yes [name], MD  oxyCODONE (OXY IR/ROXICODONE) 5 MG immediate release tablet Take 5 mg by mouth at bedtime.   Yes [provider]  polyethylene glycol (MIRALAX / GLYCOLAX) 17 g packet Take 17 g by mouth daily.   Yes [provider]  prochlorperazine (COMPAZINE) 10 MG tablet Take 10 mg by mouth every 6 (six) hours as needed for nausea or vomiting.   Yes [provider]  blood glucose meter kit and supplies KIT Dispense based on patient and insurance preference. Use up to four times daily as directed. 09/23/20   Florencia Reasons, MD  Insulin Pen Needle 29G X 5MM MISC For sliding scale insulin injection 09/23/20   Florencia Reasons, MD  megestrol (MEGACE ES) 625 MG/5ML suspension Take 5 mLs (625 mg total) by mouth daily. Patient not taking: Reported on 10/26/2020 07/22/20    Benay Pike, MD  Multiple Vitamin (MULTIVITAMIN WITH MINERALS) TABS tablet Take 1 tablet by mouth daily. Patient not taking: Reported on 10/26/2020 10/07/20 01/05/21  Kayleen Memos, DO  insulin aspart (NOVOLOG) 100 UNIT/ML FlexPen Before each meal 3 times a day, 140-199 - 2 units, 200-250 - 4 units, 251-299 - 6 units,  300-349 - 8 units,  350 or above 10 units. Insulin PEN if approved, provide syringes and needles if needed. Patient not taking: No sig reported 09/23/20 10/03/20  Florencia Reasons, MD    Physical Exam: Vitals:   10/26/20 1436 10/26/20 1445 10/26/20 1545 10/26/20 1700  BP: 108/85 104/82 110/89 109/72  Pulse: (!) 119 (!) 119 (!) 113 (!) 115  Resp:  (!) 28 (!) 32 (!) 27  Temp: 98.4 F (36.9 C)     TempSrc: Oral     SpO2: 95% 92% 94% 93%  Weight: 67.1 kg     Height: _0  (1.778 m)       Constitutional: NAD, calm, comfortable Eyes: PERRL, lids and conjunctivae normal ENMT: Mucous membranes are moist. Posterior pharynx clear of any exudate or lesions. Normal dentition.  Neck: normal, supple, no masses, no thyromegaly Respiratory: Mild crackles bilateral bases with no wheezing Cardiovascular: Tachycardic but regular rhythm without murmur or rub Abdomen: No tenderness or masses to palpation. No hepatosplenomegaly. Bowel sounds positive. Not distended. Soft.  Musculoskeletal: No clubbing / cyanosis. No joint deformity upper and lower extremities. No contractures. Normal muscle tone.  Skin: No rashes, lesions, ulcers.  Neurologic: CN 2-12 grossly intact B. Sensation intact. Strength 5/5 in all 4 extremities.  Psychiatric: Normal judgment and insight. Alert and oriented x 3. Normal mood.    Labs on Admission:   CBC: Recent Labs  Lab 10/20/20 1147 10/26/20 1439  WBC 4.5 9.2  NEUTROABS 3.0 7.5  HGB 8.5* 11.0*  HCT 25.4* 34.1*  MCV 98.4 102.1*  PLT 194 767   Basic Metabolic Panel: Recent Labs  Lab 10/20/20 1147 10/26/20 1439  NA 139 136  K 3.5 3.5  CL 105 97*  CO2  25 26  GLUCOSE 88 211*  BUN 9 11  CREATININE 0.58* 0.59*  CALCIUM 9.2 9.3   GFR: Estimated Creatinine Clearance: 103.7 mL/min (A) (by C-G formula based on SCr of 0.59 mg/dL (L)). Liver Function Tests: Recent Labs  Lab 10/20/20 1147  AST 16  ALT 11  ALKPHOS 115  BILITOT 0.3  PROT 6.6  ALBUMIN 3.6    Urine analysis:    Component Value Date/Time   COLORURINE YELLOW 09/17/2020 2135   APPEARANCEUR CLEAR 09/17/2020 2135   LABSPEC 1.020 09/17/2020 2135   PHURINE 8.0 09/17/2020 2135   GLUCOSEU NEGATIVE 09/17/2020 2135   HGBUR NEGATIVE 09/17/2020 2135   BILIRUBINUR NEGATIVE 09/17/2020 2135   KETONESUR NEGATIVE 09/17/2020 2135   PROTEINUR NEGATIVE 09/17/2020 2135   NITRITE NEGATIVE 09/17/2020 2135   LEUKOCYTESUR NEGATIVE 09/17/2020 2135     Radiological Exams on Admission: DG Chest 2 View  Result Date: 10/26/2020 CLINICAL DATA:  Shortness of breath. EXAM: CHEST - 2 VIEW COMPARISON:  10/02/2020 FINDINGS: Redemonstrated right chest port with catheter tip approximating the cavoatrial junction. Unchanged cardiac and mediastinal contours. Previously noted right medial basilar consolidative opacity is improved. No new focal pulmonary opacity. No pneumothorax. Possible trace pleural effusion. IMPRESSION: No acute cardiopulmonary process. Electronically Signed   By: Merilyn Baba MD   On: 10/26/2020 14:13   CT Angio Chest PE W and/or Wo Contrast  Result Date: 10/26/2020 CLINICAL DATA:  Metastatic head neck cancer. Recurrent pneumonia. Cough. Shortness of breath. EXAM: CT ANGIOGRAPHY CHEST WITH CONTRAST TECHNIQUE: Multidetector CT imaging of the chest was performed using the standard protocol during bolus administration of intravenous contrast. Multiplanar CT image reconstructions and MIPs were obtained to evaluate the vascular anatomy. CONTRAST:  5m OMNIPAQUE IOHEXOL 350 MG/ML SOLN COMPARISON:  Plain film 10/26/2020.  CT 10/03/2020 FINDINGS: Cardiovascular: The quality of this exam for  evaluation of pulmonary embolism is sufficient. There is mild motion degradation throughout. However, a large left-sided pulmonary embolism is seen within the main pulmonary artery and lobar branches. Right heart strain, as evidenced by an RV to LV ratio of 4.6/3.9= 1.2. Aortic atherosclerosis. Normal heart size, without pericardial effusion. Multivessel coronary artery atherosclerosis. Right Port-A-Cath tip mid right atrium. Mediastinum/Nodes: No mediastinal or hilar adenopathy. Lungs/Pleura: Tiny right pleural effusion, minimally decreased. The left-sided pleural effusion has resolved. Slight improvement in central right lower lobe airspace disease. Mild superior segment right lower lobe airspace disease laterally including on 62/10, new. Multifocal patchy right upper lobe airspace disease is slightly increased. Right upper lobe 5 mm nodule on 38/10 is increased from 2  mm on the prior. A right middle lobe 1.5 cm nodule on 79/10 has minimally enlarged from 1.4 cm on the prior. Somewhat vague pleural-based right lower lobe 7 mm nodule on 54/10 is similar to minimally enlarged from 6 mm on the prior. Upper Abdomen: Motion degradation continuing into the upper abdomen. Grossly normal imaged liver, spleen, stomach, pancreas, gallbladder, adrenal glands, kidneys. Musculoskeletal: Multifocal osseous metastasis again identified, primarily sclerotic in appearance. Anterior left fourth rib osseous and soft tissue density lesion including on 81/4 is relatively similar. An expansile lesion involving the sixth anterior right rib including on 104/4 is felt to be slightly increased. Compression deformity at T8 is moderate and similar. Review of the MIP images confirms the above findings. IMPRESSION: 1. Mildly motion degraded exam. 2. Large volume left-sided pulmonary embolism with evidence of right heart strain. (RV/LV Ratio = 1.2) consistent with at least submassive (intermediate risk) PE. The presence of right heart strain has  been associated with an increased risk of morbidity and mortality. Please refer to the "PE Focused" order set in EPIC. 3. Shifting right-sided pulmonary opacities, most consistent with infection or aspiration. 4. Pulmonary nodules, felt to be slightly progressive and indicative of metastatic disease. 5. Widespread osseous metastasis, mildly progressive as above. 6. Coronary artery atherosclerosis. Aortic Atherosclerosis (ICD10-I70.0). Case was discussed with Dr Nada Libman at 4:25 pm. Electronically Signed   By: Abigail Miyamoto M.D.   On: 10/26/2020 16:59    EKG: Independently reviewed.   Cherene Altes, MD [facility] Office  [phone] Pager - Text Page per Amion as per below:  On-Call/Text Page:      [email or web address]  If 7PM-7AM, please contact night-coverage [email or web address] 10/26/2020, 6:06 PM

## 2020-10-27 ENCOUNTER — Encounter (HOSPITAL_COMMUNITY): Payer: Self-pay | Admitting: Internal Medicine

## 2020-10-27 ENCOUNTER — Inpatient Hospital Stay (HOSPITAL_COMMUNITY): Payer: Managed Care, Other (non HMO)

## 2020-10-27 DIAGNOSIS — I2699 Other pulmonary embolism without acute cor pulmonale: Secondary | ICD-10-CM

## 2020-10-27 DIAGNOSIS — I2609 Other pulmonary embolism with acute cor pulmonale: Secondary | ICD-10-CM | POA: Diagnosis not present

## 2020-10-27 LAB — CBC
HCT: 27.4 % — ABNORMAL LOW (ref 39.0–52.0)
HCT: 26.8 % — ABNORMAL LOW (ref 39.0–52.0)
Hemoglobin: 9 g/dL — ABNORMAL LOW (ref 13.0–17.0)
Hemoglobin: 8.9 g/dL — ABNORMAL LOW (ref 13.0–17.0)
MCH: 32.8 pg (ref 26.0–34.0)
MCH: 33 pg (ref 26.0–34.0)
MCHC: 32.8 g/dL (ref 30.0–36.0)
MCHC: 33.2 g/dL (ref 30.0–36.0)
MCV: 100 fL (ref 80.0–100.0)
MCV: 99.3 fL (ref 80.0–100.0)
Platelets: 166 10*3/uL (ref 150–400)
Platelets: 148 10*3/uL — ABNORMAL LOW (ref 150–400)
RBC: 2.74 MIL/uL — ABNORMAL LOW (ref 4.22–5.81)
RBC: 2.7 MIL/uL — ABNORMAL LOW (ref 4.22–5.81)
RDW: 18 % — ABNORMAL HIGH (ref 11.5–15.5)
RDW: 17.8 % — ABNORMAL HIGH (ref 11.5–15.5)
WBC: 8.8 10*3/uL (ref 4.0–10.5)
WBC: 7.7 10*3/uL (ref 4.0–10.5)
nRBC: 0.2 % (ref 0.0–0.2)
nRBC: 0.3 % — ABNORMAL HIGH (ref 0.0–0.2)

## 2020-10-27 LAB — COMPREHENSIVE METABOLIC PANEL
ALT: 12 U/L (ref 0–44)
AST: 16 U/L (ref 15–41)
Albumin: 3.1 g/dL — ABNORMAL LOW (ref 3.5–5.0)
Alkaline Phosphatase: 97 U/L (ref 38–126)
Anion gap: 12 (ref 5–15)
BUN: 7 mg/dL (ref 6–20)
CO2: 26 mmol/L (ref 22–32)
Calcium: 9 mg/dL (ref 8.9–10.3)
Chloride: 99 mmol/L (ref 98–111)
Creatinine, Ser: 0.44 mg/dL — ABNORMAL LOW (ref 0.61–1.24)
GFR, Estimated: >60 mL/min (ref >60)
Glucose, Bld: 191 mg/dL — ABNORMAL HIGH (ref 70–99)
Potassium: 4.1 mmol/L (ref 3.5–5.1)
Sodium: 137 mmol/L (ref 135–145)
Total Bilirubin: 0.4 mg/dL (ref 0.3–1.2)
Total Protein: 6.5 g/dL (ref 6.5–8.1)

## 2020-10-27 LAB — ECHOCARDIOGRAM COMPLETE
AR max vel: 3.05 cm2
AV Area VTI: 3.16 cm2
AV Area mean vel: 2.9 cm2
AV Mean grad: 2 mmHg
AV Peak grad: 4.5 mmHg
Ao pk vel: 1.06 m/s
Area-P 1/2: 4.41 cm2
Calc EF: 52.5 %
Height: 70 in
S' Lateral: 2.9 cm
Single Plane A2C EF: 55.7 %
Single Plane A4C EF: 47.9 %
Weight: 2368 oz

## 2020-10-27 LAB — HEPARIN LEVEL (UNFRACTIONATED)
Heparin Unfractionated: 0.36 IU/mL (ref 0.30–0.70)
Heparin Unfractionated: 0.44 IU/mL (ref 0.30–0.70)
Heparin Unfractionated: 0.53 IU/mL (ref 0.30–0.70)

## 2020-10-27 LAB — GLUCOSE, CAPILLARY
Glucose-Capillary: 156 mg/dL — ABNORMAL HIGH (ref 70–99)
Glucose-Capillary: 99 mg/dL (ref 70–99)
Glucose-Capillary: 132 mg/dL — ABNORMAL HIGH (ref 70–99)
Glucose-Capillary: 243 mg/dL — ABNORMAL HIGH (ref 70–99)

## 2020-10-27 MED ORDER — LIP MEDEX EX OINT
TOPICAL_OINTMENT | CUTANEOUS | Status: DC | PRN
  Filled 2020-10-27: qty 7

## 2020-10-27 MED ORDER — HEPARIN BOLUS VIA INFUSION
2000.0000 [IU] | Freq: Once | INTRAVENOUS | Status: AC
  Administered 2020-10-27: 2000 [IU] via INTRAVENOUS
  Filled 2020-10-27: qty 2000

## 2020-10-27 NOTE — Progress Notes (Signed)
MEWS continues to be red. Pulse 122, respirations 39.

## 2020-10-27 NOTE — Consult Note (Addendum)
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  He presented to the emergency department with abrupt onset of shortness of breath and hypoxia.  The patient is well-known to our service as we follow him for metastatic squamous cell carcinoma.  He has been receiving treatment with carboplatin and Keytruda.  Last cycle was given on 10/20/2020.  The patient's wife contacted our office yesterday and let us know the patient was having a low-grade fever, chills, weakness, and low O2 sats ranging in the 75 to 85% range.  She was advised to take him to the emergency department.  CTA chest showed a large volume left-sided pulmonary embolism with evidence of right heart strain, shifting right-sided pulmonary opacities most consistent with infection or aspiration, pulmonary nodules felt to be slightly progressive and indicative of metastatic disease, widespread osseous metastasis which is mildly progressive.  He has been started on heparin.  CT of the head was normal.  He has been evaluated by PCCM and IR.  IR planning for thrombolytic therapy later today.  Today, the patient is resting quietly.  His wife tells me that he still has some shortness of breath particularly with movement.  She thinks his shortness of breath is somewhat better compared to prior to admission.  He is not having any chest pain.  No bleeding reported.  No other complaints voiced  today.  Medical oncology was asked see the patient for recommendations regarding his metastatic squamous cell carcinoma and acute PE.   Past Medical History:  Diagnosis Date   Diabetes mellitus without complication ([place])    Malignant neoplasm of base of tongue ([place]) 06/02/2018  :   Past Surgical History:  Procedure Laterality Date   IR IMAGING GUIDED PORT INSERTION  07/15/2020   left neck dissection Left 05/04/2019   Left Neck Dissection by Dr. [name] at [facility]    neck sugery     as a child "had a knot removed from neck" not sure which side.    RIght neck dissection Right 06/16/2018   TORS and right neck dissection. Dr. [name] at [facility]  :   Current Facility-Administered Medications  Medication Dose Route Frequency Provider Last Rate Last Admin   acetaminophen (TYLENOL) tablet 650 mg  650 mg Oral Q6H PRN Cherene Altes, MD       albuterol (PROVENTIL) (2.5 MG/3ML) 0.083% nebulizer solution 3 mL  3 mL Inhalation Q6H PRN Cherene Altes, MD       bisacodyl (DULCOLAX) suppository 10 mg  10 mg Rectal Daily PRN Cherene Altes, MD       Chlorhexidine Gluconate Cloth 2 % PADS 6 each  6 each Topical Daily Cherene Altes, MD   6 each at 10/27/20 0836   diphenhydrAMINE (BENADRYL) capsule 25 mg  25 mg Oral QHS PRN Cherene Altes, MD       feeding supplement (ENSURE ENLIVE / ENSURE PLUS) liquid 237 mL  237 mL Oral TID BM Cherene Altes, MD       gabapentin (NEURONTIN) capsule 900 mg  900 mg  Oral BID Joette Catching T, MD       heparin ADULT infusion 100 units/mL (25000 units/247m)  1,350 Units/hr Intravenous Continuous Poindexter, Leann T, RPH 13.5 mL/hr at 10/27/20 0017 1,350 Units/hr at 10/27/20 0017   insulin aspart (novoLOG) injection 0-5 Units  0-5 Units Subcutaneous QHS MCherene Altes MD   3 Units at 10/26/20 2148   insulin aspart (novoLOG) injection 0-9 Units  0-9 Units Subcutaneous TID WC MCherene Altes MD   2 Units at 10/27/20 [id number]  lactated ringers  infusion   Intravenous Continuous MCherene Altes MD 50 mL/hr at 10/26/20 2138 New Bag at 10/26/20 2138   lidocaine (LIDODERM) 5 % 1 patch  1 patch Transdermal Daily PRN MCherene Altes MD       megestrol (MEGACE) 400 MG/10ML suspension 800 mg  800 mg Oral Daily MCherene Altes MD       mirtazapine (REMERON) tablet 30 mg  30 mg Oral QHS MJoette CatchingT, MD   30 mg at 10/26/20 2037   ondansetron (ZOFRAN-ODT) disintegrating tablet 8 mg  8 mg Oral Q8H PRN MCherene Altes MD       oxyCODONE (Oxy IR/ROXICODONE) immediate release tablet 5-10 mg  5-10 mg Oral Q4H PRN MCherene Altes MD       polyethylene glycol (MIRALAX / GLYCOLAX) packet 17 g  17 g Oral Daily MJoette CatchingT, MD   17 g at 10/26/20 2059   senna (SENOKOT) tablet 8.6 mg  1 tablet Oral BID MJoette CatchingT, MD   8.6 mg at 10/26/20 2036   sodium chloride flush (NS) 0.9 % injection 10-40 mL  10-40 mL Intracatheter PRN MCherene Altes MD       Facility-Administered Medications Ordered in Other Encounters  Medication Dose Route Frequency Provider Last Rate Last Admin   sodium chloride flush (NS) 0.9 % injection 10 mL  10 mL Intracatheter PRN IBenay Pike MD   10 mL at 07/22/20 1638      Allergies  Allergen Reactions   Penicillins Other (See Comments)    Unknown reaction- from childhood?  :  History reviewed. No pertinent family history.:   Social History   Socioeconomic History   Marital status: Married    Spouse name: Not on file   Number of children: 4   Years of education: Not on file   Highest education level: Not on file  Occupational History   Not on file  Tobacco Use   Smoking status: Never   Smokeless tobacco: Never  Vaping Use   Vaping Use: Never used  Substance and Sexual Activity   Alcohol use: Yes    Comment: occasional   Drug use: Never   Sexual activity: Not on file  Other Topics Concern   Not on file  Social History Narrative   Not on file   Social Determinants  of Health   Financial Resource Strain: Not on file  Food Insecurity: Not on file  Transportation Needs: Not on file  Physical Activity: Not on file  Stress: Not on file  Social Connections: Not on file  Intimate Partner Violence: Not on file  :  Review of Systems: A comprehensive 14 point review of systems was negative except as noted in the HPI.  Exam: Patient Vitals for the past 24 hrs:  BP Temp Temp src Pulse Resp SpO2 Height Weight  10/27/20 0938 112/81 97.9 F (36.6 C) Oral -- -- -- -- --  10/27/20 0800 104/83 -- -- (Marland Kitchen  105 (!) 30 96 % -- --  10/27/20 0400 110/88 98 F (36.7 C) Oral (!) 110 (!) 31 95 % -- --  10/27/20 0000 -- 97.8 F (36.6 C) Oral -- -- -- -- --  10/26/20 2240 -- 98.2 F (36.8 C) -- -- -- -- -- --  10/26/20 2200 -- 98.2 F (36.8 C) Oral -- -- -- -- --  10/26/20 2130 -- 98.2 F (36.8 C) -- -- -- -- -- --  10/26/20 2030 -- 98 F (36.7 C) -- -- -- -- -- --  10/26/20 2000 -- 98 F (36.7 C) -- -- -- -- -- --  10/26/20 1834 104/82 98.7 F (37.1 C) Oral (!) 121 (!) 28 (!) 89 % -- --  10/26/20 1830 -- -- -- -- -- 93 % -- --  10/26/20 1700 109/72 -- -- (!) 115 (!) 27 93 % -- --  10/26/20 1545 110/89 -- -- (!) 113 (!) 32 94 % -- --  10/26/20 1445 104/82 -- -- (!) 119 (!) 28 92 % -- --  10/26/20 1436 108/85 98.4 F (36.9 C) Oral (!) 119 -- 95 % '5\' 10"'$  (1.778 m) 67.1 kg  10/26/20 1334 -- -- -- -- -- -- '5\' 8"'$  (1.727 m) 68 kg  10/26/20 1332 108/74 98 F (36.7 C) -- (!) 122 20 94 % -- --    General: Resting quietly, no distress.   Eyes:  no scleral icterus.   ENT:  There were no oropharyngeal lesions.     Respiratory: lungs were clear bilaterally without wheezing or crackles.   Cardiovascular:  Regular rate and rhythm, S1/S2, without murmur, rub or gallop.  There was no pedal edema.   GI:  abdomen was soft, flat, nontender, nondistended, without organomegaly.   Skin exam was without echymosis, petichae.   Neuro exam was nonfocal. Patient was alert and  oriented.    Lab Results  Component Value Date   WBC 8.8 10/27/2020   HGB 9.0 (L) 10/27/2020   HCT 27.4 (L) 10/27/2020   PLT 166 10/27/2020   GLUCOSE 191 (H) 10/27/2020   ALT 12 10/27/2020   AST 16 10/27/2020   NA 137 10/27/2020   K 4.1 10/27/2020   CL 99 10/27/2020   CREATININE 0.44 (L) 10/27/2020   BUN 7 10/27/2020   CO2 26 10/27/2020    DG Chest 2 View  Result Date: 10/26/2020 CLINICAL DATA:  Shortness of breath. EXAM: CHEST - 2 VIEW COMPARISON:  10/02/2020 FINDINGS: Redemonstrated right chest port with catheter tip approximating the cavoatrial junction. Unchanged cardiac and mediastinal contours. Previously noted right medial basilar consolidative opacity is improved. No new focal pulmonary opacity. No pneumothorax. Possible trace pleural effusion. IMPRESSION: No acute cardiopulmonary process. Electronically Signed   By: Merilyn Baba MD   On: 10/26/2020 14:13   DG Chest 2 View  Result Date: 10/02/2020 CLINICAL DATA:  Chest pain EXAM: CHEST - 2 VIEW COMPARISON:  09/27/2020 FINDINGS: The lungs are symmetrically well expanded. Small bilateral pleural effusions are present, new since prior examination. Medial right basilar consolidation is unchanged. No pneumothorax. Right internal jugular chest port is seen with its tip at the superior cavoatrial junction. Cardiac size within normal limits. Pulmonary vascularity is normal. No acute bone abnormality. IMPRESSION: Interval development of small bilateral pleural effusions. Stable right medial basilar consolidation. Electronically Signed   By: Fidela Salisbury MD   On: 10/02/2020 21:00   CT HEAD W & WO CONTRAST (5MM)  Result Date: 10/27/2020 CLINICAL DATA:  Initial evaluation  for possible metastatic disease, history of squamous cell carcinoma at base of tongue. EXAM: CT HEAD WITHOUT AND WITH CONTRAST TECHNIQUE: Contiguous axial images were obtained from the base of the skull through the vertex without and with intravenous contrast CONTRAST:   41m OMNIPAQUE IOHEXOL 350 MG/ML SOLN COMPARISON:  None available. FINDINGS: Brain: Cerebral volume within normal limits for patient age. No evidence for acute intracranial hemorrhage. No findings to suggest acute large vessel territory infarct. No mass lesion, midline shift, or mass effect. Ventricles are normal in size without evidence for hydrocephalus. No extra-axial fluid collection identified. Vascular: No hyperdense vessel seen prior to contrast administration. Normal intravascular enhancement seen throughout the major arterial and venous structures following contrast administration. Skull: Scalp soft tissues demonstrate no acute abnormality. Calvarium intact. Sinuses/Orbits: Globes and orbital soft tissues within normal limits. Visualized paranasal sinuses are clear. No mastoid effusion. IMPRESSION: Normal head CT. No evidence for intracranial hemorrhage or metastatic disease. Electronically Signed   By: BJeannine BogaM.D.   On: 10/27/2020 00:41   CT Angio Chest PE W and/or Wo Contrast  Result Date: 10/26/2020 CLINICAL DATA:  Metastatic head neck cancer. Recurrent pneumonia. Cough. Shortness of breath. EXAM: CT ANGIOGRAPHY CHEST WITH CONTRAST TECHNIQUE: Multidetector CT imaging of the chest was performed using the standard protocol during bolus administration of intravenous contrast. Multiplanar CT image reconstructions and MIPs were obtained to evaluate the vascular anatomy. CONTRAST:  837mOMNIPAQUE IOHEXOL 350 MG/ML SOLN COMPARISON:  Plain film 10/26/2020.  CT 10/03/2020 FINDINGS: Cardiovascular: The quality of this exam for evaluation of pulmonary embolism is sufficient. There is mild motion degradation throughout. However, a large left-sided pulmonary embolism is seen within the main pulmonary artery and lobar branches. Right heart strain, as evidenced by an RV to LV ratio of 4.6/3.9= 1.2. Aortic atherosclerosis. Normal heart size, without pericardial effusion. Multivessel coronary artery  atherosclerosis. Right Port-A-Cath tip mid right atrium. Mediastinum/Nodes: No mediastinal or hilar adenopathy. Lungs/Pleura: Tiny right pleural effusion, minimally decreased. The left-sided pleural effusion has resolved. Slight improvement in central right lower lobe airspace disease. Mild superior segment right lower lobe airspace disease laterally including on 62/10, new. Multifocal patchy right upper lobe airspace disease is slightly increased. Right upper lobe 5 mm nodule on 38/10 is increased from 2 mm on the prior. A right middle lobe 1.5 cm nodule on 79/10 has minimally enlarged from 1.4 cm on the prior. Somewhat vague pleural-based right lower lobe 7 mm nodule on 54/10 is similar to minimally enlarged from 6 mm on the prior. Upper Abdomen: Motion degradation continuing into the upper abdomen. Grossly normal imaged liver, spleen, stomach, pancreas, gallbladder, adrenal glands, kidneys. Musculoskeletal: Multifocal osseous metastasis again identified, primarily sclerotic in appearance. Anterior left fourth rib osseous and soft tissue density lesion including on 81/4 is relatively similar. An expansile lesion involving the sixth anterior right rib including on 104/4 is felt to be slightly increased. Compression deformity at T8 is moderate and similar. Review of the MIP images confirms the above findings. IMPRESSION: 1. Mildly motion degraded exam. 2. Large volume left-sided pulmonary embolism with evidence of right heart strain. (RV/LV Ratio = 1.2) consistent with at least submassive (intermediate risk) PE. The presence of right heart strain has been associated with an increased risk of morbidity and mortality. Please refer to the "PE Focused" order set in EPIC. 3. Shifting right-sided pulmonary opacities, most consistent with infection or aspiration. 4. Pulmonary nodules, felt to be slightly progressive and indicative of metastatic disease. 5. Widespread osseous metastasis, mildly  progressive as above. 6.  Coronary artery atherosclerosis. Aortic Atherosclerosis (ICD10-I70.0). Case was discussed with Dr Nada Libman at 4:25 pm. Electronically Signed   By: Abigail Miyamoto M.D.   On: 10/26/2020 16:59   CT Angio Chest PE W and/or Wo Contrast  Result Date: 10/03/2020 CLINICAL DATA:  Left chest pain EXAM: CT ANGIOGRAPHY CHEST WITH CONTRAST TECHNIQUE: Multidetector CT imaging of the chest was performed using the standard protocol during bolus administration of intravenous contrast. Multiplanar CT image reconstructions and MIPs were obtained to evaluate the vascular anatomy. CONTRAST:  87m OMNIPAQUE IOHEXOL 350 MG/ML SOLN COMPARISON:  Chest radiographs dated 10/02/2020. CTA chest dated 09/17/2020. FINDINGS: Cardiovascular: Satisfactory opacification of the bilateral pulmonary arteries to the lobar level. No evidence of pulmonary embolism. Although not tailored for evaluation of the thoracic aorta, there is no evidence of thoracic aortic aneurysm or dissection. Mild atherosclerotic calcifications of the aortic arch. Heart is normal in size.  No pericardial effusion. Mild coronary atherosclerosis of the LAD. Mediastinum/Nodes: No suspicious mediastinal lymphadenopathy. Visualized thyroid is unremarkable. Lungs/Pleura: Scarring/radiation changes at the lung apices. Patchy opacity in the medial right lower lobe (series 5/image 93), less rounded/masslike on the current study, continuing to favor infection/pneumonia. Associated small bilateral pleural effusions, unchanged on the right and new on the left. Associated mild compressive atelectasis in the left lower lobe. 14 mm right middle lobe nodule (series 6/image 82), previously 12 mm. 6 mm posterior right lower lobe nodule (series 6/image 54), previously 4 mm. These are suspicious for metastatic disease when correlating with prior PET. Mild patchy/ground-glass opacities in the right upper lobe (series 6/image 68), suspicious for additional infection/pneumonia, new. Mild vague  ground-glass opacity in the left lower lobe (series 6/image 88), equivocal. Debris in the trachea (series 6/image 31), likely reflecting secretions. No pneumothorax. Upper Abdomen: Visualized upper abdomen is grossly unremarkable. Musculoskeletal: Multifocal osseous metastases involving the left sternum, multiple ribs, multiple thoracic vertebral bodies, and the right proximal humerus, grossly unchanged. However, the soft tissue component along the left anterior 4th rib is more conspicuous on the current study, suggesting mild progression (series 5/image 84). Review of the MIP images confirms the above findings. IMPRESSION: No evidence of pulmonary embolism. Patchy right lower lobe opacity remains suspicious for pneumonia. Additional mild right upper lobe infection/pneumonia. Small bilateral pleural effusions, progressive on the left. Two right lung nodules are mildly progressive from recent CT, suspicious for metastatic disease. Mildly progressive soft tissue component associated with left anterior 4th rib metastasis. Additional multifocal osseous metastases are unchanged. Aortic Atherosclerosis (ICD10-I70.0). Electronically Signed   By: SJulian HyM.D.   On: 10/03/2020 03:04     DG Chest 2 View  Result Date: 10/26/2020 CLINICAL DATA:  Shortness of breath. EXAM: CHEST - 2 VIEW COMPARISON:  10/02/2020 FINDINGS: Redemonstrated right chest port with catheter tip approximating the cavoatrial junction. Unchanged cardiac and mediastinal contours. Previously noted right medial basilar consolidative opacity is improved. No new focal pulmonary opacity. No pneumothorax. Possible trace pleural effusion. IMPRESSION: No acute cardiopulmonary process. Electronically Signed   By: AMerilyn BabaMD   On: 10/26/2020 14:13   DG Chest 2 View  Result Date: 10/02/2020 CLINICAL DATA:  Chest pain EXAM: CHEST - 2 VIEW COMPARISON:  09/27/2020 FINDINGS: The lungs are symmetrically well expanded. Small bilateral pleural  effusions are present, new since prior examination. Medial right basilar consolidation is unchanged. No pneumothorax. Right internal jugular chest port is seen with its tip at the superior cavoatrial junction. Cardiac size within normal limits. Pulmonary vascularity  is normal. No acute bone abnormality. IMPRESSION: Interval development of small bilateral pleural effusions. Stable right medial basilar consolidation. Electronically Signed   By: Fidela Salisbury MD   On: 10/02/2020 21:00   CT HEAD W & WO CONTRAST (5MM)  Result Date: 10/27/2020 CLINICAL DATA:  Initial evaluation for possible metastatic disease, history of squamous cell carcinoma at base of tongue. EXAM: CT HEAD WITHOUT AND WITH CONTRAST TECHNIQUE: Contiguous axial images were obtained from the base of the skull through the vertex without and with intravenous contrast CONTRAST:  81m OMNIPAQUE IOHEXOL 350 MG/ML SOLN COMPARISON:  None available. FINDINGS: Brain: Cerebral volume within normal limits for patient age. No evidence for acute intracranial hemorrhage. No findings to suggest acute large vessel territory infarct. No mass lesion, midline shift, or mass effect. Ventricles are normal in size without evidence for hydrocephalus. No extra-axial fluid collection identified. Vascular: No hyperdense vessel seen prior to contrast administration. Normal intravascular enhancement seen throughout the major arterial and venous structures following contrast administration. Skull: Scalp soft tissues demonstrate no acute abnormality. Calvarium intact. Sinuses/Orbits: Globes and orbital soft tissues within normal limits. Visualized paranasal sinuses are clear. No mastoid effusion. IMPRESSION: Normal head CT. No evidence for intracranial hemorrhage or metastatic disease. Electronically Signed   By: BJeannine BogaM.D.   On: 10/27/2020 00:41   CT Angio Chest PE W and/or Wo Contrast  Result Date: 10/26/2020 CLINICAL DATA:  Metastatic head neck cancer.  Recurrent pneumonia. Cough. Shortness of breath. EXAM: CT ANGIOGRAPHY CHEST WITH CONTRAST TECHNIQUE: Multidetector CT imaging of the chest was performed using the standard protocol during bolus administration of intravenous contrast. Multiplanar CT image reconstructions and MIPs were obtained to evaluate the vascular anatomy. CONTRAST:  874mOMNIPAQUE IOHEXOL 350 MG/ML SOLN COMPARISON:  Plain film 10/26/2020.  CT 10/03/2020 FINDINGS: Cardiovascular: The quality of this exam for evaluation of pulmonary embolism is sufficient. There is mild motion degradation throughout. However, a large left-sided pulmonary embolism is seen within the main pulmonary artery and lobar branches. Right heart strain, as evidenced by an RV to LV ratio of 4.6/3.9= 1.2. Aortic atherosclerosis. Normal heart size, without pericardial effusion. Multivessel coronary artery atherosclerosis. Right Port-A-Cath tip mid right atrium. Mediastinum/Nodes: No mediastinal or hilar adenopathy. Lungs/Pleura: Tiny right pleural effusion, minimally decreased. The left-sided pleural effusion has resolved. Slight improvement in central right lower lobe airspace disease. Mild superior segment right lower lobe airspace disease laterally including on 62/10, new. Multifocal patchy right upper lobe airspace disease is slightly increased. Right upper lobe 5 mm nodule on 38/10 is increased from 2 mm on the prior. A right middle lobe 1.5 cm nodule on 79/10 has minimally enlarged from 1.4 cm on the prior. Somewhat vague pleural-based right lower lobe 7 mm nodule on 54/10 is similar to minimally enlarged from 6 mm on the prior. Upper Abdomen: Motion degradation continuing into the upper abdomen. Grossly normal imaged liver, spleen, stomach, pancreas, gallbladder, adrenal glands, kidneys. Musculoskeletal: Multifocal osseous metastasis again identified, primarily sclerotic in appearance. Anterior left fourth rib osseous and soft tissue density lesion including on 81/4 is  relatively similar. An expansile lesion involving the sixth anterior right rib including on 104/4 is felt to be slightly increased. Compression deformity at T8 is moderate and similar. Review of the MIP images confirms the above findings. IMPRESSION: 1. Mildly motion degraded exam. 2. Large volume left-sided pulmonary embolism with evidence of right heart strain. (RV/LV Ratio = 1.2) consistent with at least submassive (intermediate risk) PE. The presence of right heart  strain has been associated with an increased risk of morbidity and mortality. Please refer to the "PE Focused" order set in EPIC. 3. Shifting right-sided pulmonary opacities, most consistent with infection or aspiration. 4. Pulmonary nodules, felt to be slightly progressive and indicative of metastatic disease. 5. Widespread osseous metastasis, mildly progressive as above. 6. Coronary artery atherosclerosis. Aortic Atherosclerosis (ICD10-I70.0). Case was discussed with Dr Nada Libman at 4:25 pm. Electronically Signed   By: Abigail Miyamoto M.D.   On: 10/26/2020 16:59   CT Angio Chest PE W and/or Wo Contrast  Result Date: 10/03/2020 CLINICAL DATA:  Left chest pain EXAM: CT ANGIOGRAPHY CHEST WITH CONTRAST TECHNIQUE: Multidetector CT imaging of the chest was performed using the standard protocol during bolus administration of intravenous contrast. Multiplanar CT image reconstructions and MIPs were obtained to evaluate the vascular anatomy. CONTRAST:  45m OMNIPAQUE IOHEXOL 350 MG/ML SOLN COMPARISON:  Chest radiographs dated 10/02/2020. CTA chest dated 09/17/2020. FINDINGS: Cardiovascular: Satisfactory opacification of the bilateral pulmonary arteries to the lobar level. No evidence of pulmonary embolism. Although not tailored for evaluation of the thoracic aorta, there is no evidence of thoracic aortic aneurysm or dissection. Mild atherosclerotic calcifications of the aortic arch. Heart is normal in size.  No pericardial effusion. Mild coronary  atherosclerosis of the LAD. Mediastinum/Nodes: No suspicious mediastinal lymphadenopathy. Visualized thyroid is unremarkable. Lungs/Pleura: Scarring/radiation changes at the lung apices. Patchy opacity in the medial right lower lobe (series 5/image 93), less rounded/masslike on the current study, continuing to favor infection/pneumonia. Associated small bilateral pleural effusions, unchanged on the right and new on the left. Associated mild compressive atelectasis in the left lower lobe. 14 mm right middle lobe nodule (series 6/image 82), previously 12 mm. 6 mm posterior right lower lobe nodule (series 6/image 54), previously 4 mm. These are suspicious for metastatic disease when correlating with prior PET. Mild patchy/ground-glass opacities in the right upper lobe (series 6/image 68), suspicious for additional infection/pneumonia, new. Mild vague ground-glass opacity in the left lower lobe (series 6/image 88), equivocal. Debris in the trachea (series 6/image 31), likely reflecting secretions. No pneumothorax. Upper Abdomen: Visualized upper abdomen is grossly unremarkable. Musculoskeletal: Multifocal osseous metastases involving the left sternum, multiple ribs, multiple thoracic vertebral bodies, and the right proximal humerus, grossly unchanged. However, the soft tissue component along the left anterior 4th rib is more conspicuous on the current study, suggesting mild progression (series 5/image 84). Review of the MIP images confirms the above findings. IMPRESSION: No evidence of pulmonary embolism. Patchy right lower lobe opacity remains suspicious for pneumonia. Additional mild right upper lobe infection/pneumonia. Small bilateral pleural effusions, progressive on the left. Two right lung nodules are mildly progressive from recent CT, suspicious for metastatic disease. Mildly progressive soft tissue component associated with left anterior 4th rib metastasis. Additional multifocal osseous metastases are  unchanged. Aortic Atherosclerosis (ICD10-I70.0). Electronically Signed   By: SJulian HyM.D.   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 Imaging this admission does show some concern for disease progression, but the patient did have some delays in chemotherapy secondary to recurrent hospitalization.  Plan to resume treatment  once he recovers from this hospitalization.  Acute pulmonary embolism with right sided heart strain Currently on a heparin drip with plan for thrombolytic therapy today by IR.  Agree with this plan.  He will need anticoagulation upon discharge and can consider him for DOAC versus Lovenox.  Final recommendations per Dr. Chryl Heck.  Mikey Bussing, DNP, AGPCNP-BC, AOCNP   Attending Note  I personally saw the patient, reviewed the chart and examined the patient. The plan of care was discussed with the patient and the admitting team. I agree with the assessment and plan as documented above. Thank you very much for the consultation. Patient was comfortable, having a conversation, eating without oxygen, stable hemodynamically. Ok to discharge him with lovenox with a month supply 1 mg/kg [place] BID when ok with primary team. We will transition him to DOAC's later.

## 2020-10-27 NOTE — Progress Notes (Signed)
Heparin drip running at 13.5 mL/hr.

## 2020-10-27 NOTE — Progress Notes (Signed)
ANTICOAGULATION CONSULT NOTE   Pharmacy Consult for heparin Indication: pulmonary embolus  Allergies  Allergen Reactions   Penicillins Other (See Comments)    Unknown reaction- from childhood?    Patient Measurements: Height: '5\' 10"'$  (177.8 cm) Weight: 67.1 kg (148 lb) IBW/kg (Calculated) : 73 Heparin Dosing Weight: 67.1 kg  Vital Signs: Temp: 98.2 F (36.8 C) (08/10 2240) Temp Source: Oral (08/10 1834) BP: 104/82 (08/10 1834) Pulse Rate: 121 (08/10 1834)  Labs: Recent Labs    10/26/20 1439 10/26/20 2220  HGB 11.0*  --   HCT 34.1*  --   PLT 208  --   HEPARINUNFRC  --  0.16*  CREATININE 0.59*  --   TROPONINIHS 54*  59*  --      Estimated Creatinine Clearance: 103.7 mL/min (A) (by C-G formula based on SCr of 0.59 mg/dL (L)).   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    Medications:  Scheduled:   Chlorhexidine Gluconate Cloth  6 each Topical Daily   feeding supplement  237 mL Oral TID BM   gabapentin  900 mg Oral BID   heparin  2,000 Units Intravenous Once   insulin aspart  0-5 Units Subcutaneous QHS   insulin aspart  0-9 Units Subcutaneous TID WC   megestrol  800 mg Oral Daily   mirtazapine  30 mg Oral QHS   polyethylene glycol  17 g Oral Daily   senna  1 tablet Oral BID   Infusions:   heparin 1,100 Units/hr (10/26/20 1702)   lactated ringers 50 mL/hr at 10/26/20 2138    Assessment: 50 yo male presenting with shortness of breath, weakness, and patient reports feeling like his heart is racing.  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  He does have a history of anemia, requiring blood transfusions in the past.  No anticoagulation noted PTA.  Pharmacy consulted to dose heparin for pulmonary embolus.  CBC stable  10/27/20 Heparin level = 0.16 (subtherapeutic) with heparin gtt @  1100 units/hr No line issues/interruption of therapy or bleeding noted by RN  Goal of Therapy:  Heparin level 0.3-0.7 units/ml Monitor platelets by anticoagulation protocol: Yes   Plan:  Heparin 2000 unit IV bolus x 1 followed by increasing heparin gtt to 1350 units/hr Check 6 hour HL after heparin rate increased Monitor HL and CBC daily while on heparin F/u ability to transition to oral anticoagulant  Leone Haven, PharmD 10/27/2020 12:05 AM

## 2020-10-27 NOTE — Progress Notes (Signed)
ANTICOAGULATION CONSULT NOTE   Pharmacy Consult for heparin Indication: pulmonary embolus  Allergies  Allergen Reactions   Penicillins Other (See Comments)    Unknown reaction- from childhood?    Patient Measurements: Height: '5\' 10"'$  (177.8 cm) Weight: 67.1 kg (148 lb) IBW/kg (Calculated) : 73 Heparin Dosing Weight: 67.1 kg  Vital Signs: Temp: 98 F (36.7 C) (08/11 0400) Temp Source: Oral (08/11 0400) BP: 104/83 (08/11 0800) Pulse Rate: 105 (08/11 0800)  Labs: Recent Labs    10/26/20 1439 10/26/20 2220 10/27/20 0538 10/27/20 0711  HGB 11.0*  --  9.0*  --   HCT 34.1*  --  27.4*  --   PLT 208  --  166  --   HEPARINUNFRC  --  0.16* 0.36 0.44  CREATININE 0.59*  --  0.44*  --   TROPONINIHS 54*  59*  --   --   --      Estimated Creatinine Clearance: 103.7 mL/min (A) (by C-G formula based on SCr of 0.44 mg/dL (L)).   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 CTA shows large L PE w/ RH strain and Pharmacy consulted to dose heparin.  Baseline INR, aPTT: not done Prior anticoagulation: none  Significant events:  Today, 10/27/2020: CBC: Hgb down from admission, likely dilutional; Plt  stable WNL Most recent heparin level therapeutic on 1350 units/hr No bleeding or infusion issues per nursing  Goal of Therapy: Heparin level 0.3-0.7 units/ml Monitor platelets by anticoagulation protocol: Yes  Plan: Continue heparin IV infusion at 1350 units/hr Recheck confirmatory heparin level in 6 hrs Daily CBC, daily heparin level once stable Monitor for signs of bleeding or thrombosis  Reuel Boom, PharmD, BCPS [phone] 10/27/2020, 9:17 AM

## 2020-10-27 NOTE — Progress Notes (Signed)
Heparin bolus infusing.

## 2020-10-27 NOTE — Progress Notes (Signed)
   NAME:  Albert Flowers, MRN:  [id number], DOB:  [birth year], LOS: 1 ADMISSION DATE:  10/26/2020, CONSULTATION DATE:  10/26/20 REFERRING MD:  Dr Langston Masker, CHIEF COMPLAINT: Pulmonary embolism  History of Present Illness:  Patient with a history of head and neck cancer with metastatic disease-base of tongue cancer diagnosed in 2000-currently on chemotherapy Came into the hospital with shortness of breath, chest discomfort, lightheadedness which started abruptly at work Cough Recently treated for pneumonia Undergoing evaluation for persistent lung nodule for possible bronchoscopy-concern for metastatic disease to the lungs versus primary lung cancer  Pertinent  Medical History   Past Medical History:  Diagnosis Date   Diabetes mellitus without complication ([place])    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 rate (!) 30, height '5\' 10"'$  (1.778 m), weight 67.1 kg, SpO2 96 %.        Intake/Output Summary (Last 24 hours) at 10/27/2020 0917 Last data filed at 10/27/2020 0900 Gross per 24 hour  Intake 1045.19 ml  Output 450 ml  Net 595.19 ml   Filed Weights   10/26/20 1334 10/26/20 1436  Weight: 68 kg 67.1 kg    Examination: Stable hemodynamics Appears comfortable Unchanged exam  Resolved Hospital Problem list     Assessment & Plan:  Pulmonary embolism Head and neck cancer Metastatic base of tongue cancer Recent pneumonia Protein calorie malnutrition Probable lung cancer  Head CT with no signs of metastatic disease  Appreciate interventional  radiology input Patient and spouse came to the conclusion of going ahead with catheter directed thrombolysis  Remains on heparin at the present time  Sherrilyn Rist, MD [place] [facility] Pager: See Shea Evans

## 2020-10-27 NOTE — Progress Notes (Signed)
Pharmacy Consult Note - IV heparin for PE  Labs: heparin level 0.53  A/P: Heparin level continues to be therapeutic (goal 0.3-0.7) on current IV heparin rate of 1350 units/hr. No reported bleeding. Patient currently getting catheter directed thrombolysis. To follow up post op. Daily heparin level and CBC  Adrian Saran, PharmD, BCPS Secure Chat if ?s 10/27/2020 3:27 PM

## 2020-10-27 NOTE — Progress Notes (Signed)
MEWS continues red. Pulse 110, respirations 31.

## 2020-10-27 NOTE — Progress Notes (Signed)
Patient ID: Albert Flowers, male   DOB: 1970/08/03, 50 y.o.   MRN: [id number]   Referring Physician(s): Dr. Sherrilyn Rist  Supervising Physician: Juliet Rude  Patient Status:  [facility] - In-pt  Chief Complaint:  SOB, CP, L-sided PE with R heart strain  Subjective:  Pt resting quietly in bed with his wife at bedside. He answers all questions correctly, is calm and cooperative. He is in NAD. Dr. Denna Haggard at bedside to explain catheter directed thrombolytic therapy for dx PE procedure and risks.   Allergies: Penicillins  Medications: Prior to Admission medications   Medication Sig Start Date End Date Taking? Authorizing Provider  albuterol (VENTOLIN HFA) 108 (90 Base) MCG/ACT inhaler Inhale 2 puffs into the lungs every 6 (six) hours as needed for wheezing or shortness of breath. 10/07/20  Yes Irene Pap N, DO  diphenhydrAMINE (BENADRYL) 25 MG tablet Take 1 tablet (25 mg total) by mouth at bedtime. 09/23/20  Yes Florencia Reasons, MD  gabapentin (NEURONTIN) 300 MG capsule Take 900 mg by mouth 2 (two) times daily.   Yes [provider]  glipiZIDE (GLUCOTROL) 5 MG tablet Take 0.5 tablets (2.5 mg total) by mouth daily before breakfast. 10/08/20 01/06/21 Yes Kayleen Memos, DO  lactose free nutrition (BOOST) LIQD Take 237 mLs by mouth 3 (three) times daily between meals.   Yes [provider]  lidocaine (LIDODERM) 5 % Place 1 patch onto the skin daily. Remove & Discard patch within 12 hours or as directed by MD Patient taking differently: Place 1 patch onto the skin daily as needed (for pain- Remove & Discard patch within 12 hours or as directed by MD). 10/13/20  Yes [name], [name], MD  lidocaine-prilocaine (EMLA) cream Apply to affected area once Patient taking differently: Apply 1 application topically as directed. 07/12/20  Yes Benay Pike, MD  magic mouthwash SOLN Take 15 mLs by mouth 3 (three) times daily.   Yes [provider]  megestrol (MEGACE) 400 MG/10ML  suspension Take 20 mLs (800 mg total) by mouth daily. Patient taking differently: Take 400-480 mg by mouth in the morning. 10/07/20  Yes Kayleen Memos, DO  metFORMIN (GLUCOPHAGE) 500 MG tablet Take 1 tablet (500 mg total) by mouth 2 (two) times daily with a meal. 10/07/20 01/05/21 Yes Hall, Carole N, DO  mirtazapine (REMERON) 15 MG tablet Take 2 tablets (30 mg total) by mouth at bedtime. 09/14/20  Yes [name], [name], MD  ondansetron (ZOFRAN ODT) 8 MG disintegrating tablet Take 1 tablet (8 mg total) by mouth every 8 (eight) hours as needed for nausea or vomiting. Patient taking differently: Take 8 mg by mouth every 8 (eight) hours as needed for nausea or vomiting (dissolve orally). 05/06/20  Yes [name], MD  oxyCODONE (OXY IR/ROXICODONE) 5 MG immediate release tablet Take 5 mg by mouth at bedtime.   Yes [provider]  polyethylene glycol (MIRALAX / GLYCOLAX) 17 g packet Take 17 g by mouth daily.   Yes [provider]  prochlorperazine (COMPAZINE) 10 MG tablet Take 10 mg by mouth every 6 (six) hours as needed for nausea or vomiting.   Yes [provider]  blood glucose meter kit and supplies KIT Dispense based on patient and insurance preference. Use up to four times daily as directed. 09/23/20   Florencia Reasons, MD  Insulin Pen Needle 29G X 5MM MISC For sliding scale insulin injection 09/23/20   Florencia Reasons, MD  insulin aspart (NOVOLOG) 100 UNIT/ML FlexPen Before each meal 3 times a  day, 140-199 - 2 units, 200-250 - 4 units, 251-299 - 6 units,  300-349 - 8 units,  350 or above 10 units. Insulin PEN if approved, provide syringes and needles if needed. Patient not taking: No sig reported 09/23/20 10/03/20  Florencia Reasons, MD  megestrol (MEGACE ES) 625 MG/5ML suspension Take 5 mLs (625 mg total) by mouth daily. Patient not taking: Reported on 10/26/2020 07/22/20 10/26/20  Benay Pike, MD     Vital Signs: BP 112/81 (BP Location: Left Arm)   Pulse (!) 105   Temp 97.9 F (36.6 C) (Oral)    Resp (!) 30   Ht _0  (1.778 m)   Wt 148 lb (67.1 kg)   SpO2 96%   BMI 21.24 kg/m   Physical Exam Vitals reviewed.  Cardiovascular:     Rate and Rhythm: Regular rhythm. Tachycardia present.  Pulmonary:     Effort: Pulmonary effort is normal. Tachypnea present.     Breath sounds: Normal breath sounds. No decreased breath sounds, wheezing, rhonchi or rales.  Abdominal:     Palpations: Abdomen is soft.     Tenderness: There is no abdominal tenderness. There is no guarding.  Musculoskeletal:     Right lower leg: No edema.     Left lower leg: No edema.  Skin:    General: Skin is warm and dry.  Neurological:     Mental Status: He is alert and oriented to person, place, and time.  Psychiatric:        Mood and Affect: Mood normal.        Behavior: Behavior normal.    Imaging: DG Chest 2 View  Result Date: 10/26/2020 CLINICAL DATA:  Shortness of breath. EXAM: CHEST - 2 VIEW COMPARISON:  10/02/2020 FINDINGS: Redemonstrated right chest port with catheter tip approximating the cavoatrial junction. Unchanged cardiac and mediastinal contours. Previously noted right medial basilar consolidative opacity is improved. No new focal pulmonary opacity. No pneumothorax. Possible trace pleural effusion. IMPRESSION: No acute cardiopulmonary process. Electronically Signed   By: Merilyn Baba MD   On: 10/26/2020 14:13   CT HEAD W & WO CONTRAST (5MM)  Result Date: 10/27/2020 CLINICAL DATA:  Initial evaluation for possible metastatic disease, history of squamous cell carcinoma at base of tongue. EXAM: CT HEAD WITHOUT AND WITH CONTRAST TECHNIQUE: Contiguous axial images were obtained from the base of the skull through the vertex without and with intravenous contrast CONTRAST:  29m OMNIPAQUE IOHEXOL 350 MG/ML SOLN COMPARISON:  None available. FINDINGS: Brain: Cerebral volume within normal limits for patient age. No evidence for acute intracranial hemorrhage. No findings to suggest acute large vessel  territory infarct. No mass lesion, midline shift, or mass effect. Ventricles are normal in size without evidence for hydrocephalus. No extra-axial fluid collection identified. Vascular: No hyperdense vessel seen prior to contrast administration. Normal intravascular enhancement seen throughout the major arterial and venous structures following contrast administration. Skull: Scalp soft tissues demonstrate no acute abnormality. Calvarium intact. Sinuses/Orbits: Globes and orbital soft tissues within normal limits. Visualized paranasal sinuses are clear. No mastoid effusion. IMPRESSION: Normal head CT. No evidence for intracranial hemorrhage or metastatic disease. Electronically Signed   By: BJeannine BogaM.D.   On: 10/27/2020 00:41   CT Angio Chest PE W and/or Wo Contrast  Result Date: 10/26/2020 CLINICAL DATA:  Metastatic head neck cancer. Recurrent pneumonia. Cough. Shortness of breath. EXAM: CT ANGIOGRAPHY CHEST WITH CONTRAST TECHNIQUE: Multidetector CT imaging of the chest was performed using the standard protocol during bolus administration of  intravenous contrast. Multiplanar CT image reconstructions and MIPs were obtained to evaluate the vascular anatomy. CONTRAST:  22m OMNIPAQUE IOHEXOL 350 MG/ML SOLN COMPARISON:  Plain film 10/26/2020.  CT 10/03/2020 FINDINGS: Cardiovascular: The quality of this exam for evaluation of pulmonary embolism is sufficient. There is mild motion degradation throughout. However, a large left-sided pulmonary embolism is seen within the main pulmonary artery and lobar branches. Right heart strain, as evidenced by an RV to LV ratio of 4.6/3.9= 1.2. Aortic atherosclerosis. Normal heart size, without pericardial effusion. Multivessel coronary artery atherosclerosis. Right Port-A-Cath tip mid right atrium. Mediastinum/Nodes: No mediastinal or hilar adenopathy. Lungs/Pleura: Tiny right pleural effusion, minimally decreased. The left-sided pleural effusion has resolved. Slight  improvement in central right lower lobe airspace disease. Mild superior segment right lower lobe airspace disease laterally including on 62/10, new. Multifocal patchy right upper lobe airspace disease is slightly increased. Right upper lobe 5 mm nodule on 38/10 is increased from 2 mm on the prior. A right middle lobe 1.5 cm nodule on 79/10 has minimally enlarged from 1.4 cm on the prior. Somewhat vague pleural-based right lower lobe 7 mm nodule on 54/10 is similar to minimally enlarged from 6 mm on the prior. Upper Abdomen: Motion degradation continuing into the upper abdomen. Grossly normal imaged liver, spleen, stomach, pancreas, gallbladder, adrenal glands, kidneys. Musculoskeletal: Multifocal osseous metastasis again identified, primarily sclerotic in appearance. Anterior left fourth rib osseous and soft tissue density lesion including on 81/4 is relatively similar. An expansile lesion involving the sixth anterior right rib including on 104/4 is felt to be slightly increased. Compression deformity at T8 is moderate and similar. Review of the MIP images confirms the above findings. IMPRESSION: 1. Mildly motion degraded exam. 2. Large volume left-sided pulmonary embolism with evidence of right heart strain. (RV/LV Ratio = 1.2) consistent with at least submassive (intermediate risk) PE. The presence of right heart strain has been associated with an increased risk of morbidity and mortality. Please refer to the "PE Focused" order set in EPIC. 3. Shifting right-sided pulmonary opacities, most consistent with infection or aspiration. 4. Pulmonary nodules, felt to be slightly progressive and indicative of metastatic disease. 5. Widespread osseous metastasis, mildly progressive as above. 6. Coronary artery atherosclerosis. Aortic Atherosclerosis (ICD10-I70.0). Case was discussed with Dr TNada Libmanat 4:25 pm. Electronically Signed   By: KAbigail MiyamotoM.D.   On: 10/26/2020 16:59    Labs:  CBC: Recent Labs     10/13/20 1408 10/20/20 1147 10/26/20 1439 10/27/20 0538  WBC 4.0 4.5 9.2 8.8  HGB 8.4* 8.5* 11.0* 9.0*  HCT 24.9* 25.4* 34.1* 27.4*  PLT 188 194 208 166    COAGS: Recent Labs    05/12/20 1147 09/17/20 1346  INR 0.9 1.2  APTT  --  32    BMP: Recent Labs    11/12/19 1144 05/11/20 1201 10/13/20 1408 10/20/20 1147 10/26/20 1439 10/27/20 0538  NA  --    < > 135 139 136 137  K  --    < > 3.9 3.5 3.5 4.1  CL  --    < > 101 105 97* 99  CO2  --    < > _0 GLUCOSE  --    < > 179* 88 211* 191*  BUN 9   < > _1 CALCIUM  --    < > 9.5 9.2 9.3 9.0  CREATININE 0.84   < > 0.73 0.58* 0.59* 0.44*  GFRNONAA >60   < > >  60 >60 >60 >60  GFRAA >60  --   --   --   --   --    < > = values in this interval not displayed.    LIVER FUNCTION TESTS: Recent Labs    10/05/20 0947 10/13/20 1408 10/20/20 1147 10/27/20 0538  BILITOT 0.4 <0.2* 0.3 0.4  AST _0 ALT _1 ALKPHOS 117 123 115 97  PROT 6.0* 7.4 6.6 6.5  ALBUMIN 2.5* 3.3* 3.6 3.1*    Assessment and Plan: -Acute PE with R heart strain -Pt to continue IV heparin -Pt and wife agree to catheter directed thrombolytic therapy for dx PE -Hgb 9.0 from 11.0 8/10 -other labs WNL Risks and benefits discussed by Dr. Denna Haggard with the patient including, but not limited to bleeding, possible life threatening bleeding and need for blood product transfusion, vascular injury, stroke, contrast induced renal failure, and infection.  All of the patient's questions were answered, patient is agreeable to proceed. Consent signed and in chart.   Electronically Signed: Tyson Alias, NP 10/27/2020, 9:39 AM   I spent a total of 20 minutes at the the patient's bedside AND on the patient's hospital floor or unit, greater than 50% of which was counseling/coordinating care for catheter directed thrombolytic therapy for pulmonary embolism.

## 2020-10-27 NOTE — Progress Notes (Signed)
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  This was accompanied by palpitations/tachycardia.  On presentation his room air pulse ox was noted to be in the 70s.  CTa in the ED noted a large left-sided pulmonary embolism with evidence of right heart strain.  Significant Events:  8/10 admit w/ PE  Consultants:  PCCM IR  Code Status: FULL CODE  Antimicrobials:  none  DVT prophylaxis: IV heparin   Subjective: Has remained tachycardic overnight with tachypnea as well.  Blood pressure presently stable.  Saturations in the mid to upper 90%.  No new complaints.  Is quite hungry.  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 Delories Heinz     Recent recurrent pneumonias No clinical evidence of acute bacterial infection at present   Severe protein calorie malnutrition Due to metastatic cancer -nutrition to follow   Possible lung cancer Persistent lung nodule undergoing work-up with planning for possible bronchoscopy -metastatic disease versus primary lung cancer -PCCM  following  DM2 CBG well controlled at this time    Family Communication: Spoke with wife at bedside Status is: Inpatient  Remains inpatient appropriate because:Inpatient level of care appropriate due to severity of illness  Dispo: The patient is from: Home              Anticipated d/c is to: Home              Patient currently is not medically stable to d/c.   Difficult to place patient No     Objective: Blood pressure 110/88, pulse (!) 110, temperature 98 F (36.7 C), temperature source Oral, resp. rate (!) 31, height '5\' 10"'$  (1.778 m), weight 67.1 kg, SpO2 95 %.  Intake/Output Summary (Last 24 hours) at 10/27/2020 0844 Last data filed at 10/27/2020 0525 Gross per 24 hour  Intake 621.46 ml  Output 450 ml  Net 171.46 ml   Filed Weights   10/26/20 1334 10/26/20 1436  Weight: 68 kg 67.1 kg    Examination: General: No acute respiratory distress Lungs: Clear to auscultation bilaterally without wheezes or crackles Cardiovascular: Tachycardic but regular without murmur or rub Abdomen: Nontender, nondistended, soft, bowel sounds positive, no rebound, no ascites, no appreciable mass Extremities: No significant cyanosis, clubbing, or edema bilateral lower extremities  CBC: Recent Labs  Lab 10/20/20 1147 10/26/20 1439 10/27/20 0538  WBC 4.5 9.2 8.8  NEUTROABS 3.0 7.5  --   HGB 8.5* 11.0* 9.0*  HCT 25.4* 34.1* 27.4*  MCV 98.4 102.1* 100.0  PLT 194 208 [id number]   Basic Metabolic Panel: Recent Labs  Lab 10/20/20 1147 10/26/20 1439 10/27/20 0538  NA 139 136 137  K 3.5 3.5 4.1  CL 105 97* 99  CO2 '25 26 26  '$ GLUCOSE 88 211* 191*  BUN '9 11 7  '$ CREATININE 0.58* 0.59* 0.44*  CALCIUM 9.2 9.3 9.0   GFR: Estimated Creatinine Clearance: 103.7 mL/min (A) (by C-G formula based on SCr of 0.44 mg/dL (L)).  Liver Function Tests: Recent Labs  Lab 10/20/20 1147 10/27/20 0538  AST 16 16  ALT 11 12  ALKPHOS 115 97  BILITOT 0.3 0.4  PROT 6.6 6.5  ALBUMIN 3.6 3.1*      HbA1C: Hgb A1c MFr Bld  Date/Time Value Ref Range Status  10/05/2020 09:47 AM 8.5 (H) 4.8 - 5.6 % Final    Comment:    (NOTE) Pre diabetes:          5.7%-6.4%  Diabetes:              >6.4%  Glycemic control for   <7.0% adults with diabetes   09/18/2020 12:40 AM 7.7 (H) 4.8 - 5.6 % Final    Comment:    (NOTE) Pre diabetes:          5.7%-6.4%  Diabetes:              >6.4%  Glycemic control for   <7.0% adults with diabetes     CBG: Recent Labs  Lab 10/26/20 2117 10/27/20 0758  GLUCAP 258* 156*    Recent Results (from the past 240 hour(s))  Resp Panel by RT-PCR (Flu A&B, Covid) Nasopharyngeal Swab     Status: None   Collection Time: 10/26/20  3:57 PM   Specimen: Nasopharyngeal Swab; Nasopharyngeal(NP) swabs in vial transport medium  Result Value Ref Range Status   SARS Coronavirus 2 by RT PCR NEGATIVE NEGATIVE Final    Comment: (NOTE) SARS-CoV-2 target nucleic acids are NOT DETECTED.  The SARS-CoV-2 RNA is generally detectable in upper respiratory specimens during the acute phase of infection. The lowest concentration of SARS-CoV-2 viral copies this assay can detect is 138 copies/mL. A negative result does not preclude SARS-Cov-2 infection and should not be used as the sole basis for treatment or other patient management decisions. A negative result may occur with  improper specimen collection/handling, submission of specimen other than nasopharyngeal swab, presence of viral mutation(s) within the areas targeted by this assay, and inadequate number of viral copies(<138 copies/mL). A negative result must be combined with clinical observations, patient history, and epidemiological information. The expected result is Negative.  Fact Sheet for Patients:  EntrepreneurPulse.com.au  Fact Sheet for Healthcare Providers:  IncredibleEmployment.be  This test is no t yet approved or cleared by the [place] FDA and  has been  authorized for detection and/or diagnosis of SARS-CoV-2 by FDA under an Emergency Use Authorization (EUA). This EUA will remain  in effect (meaning this test can be used) for the duration of the COVID-19 declaration under Section 564(b)(1) of the Act, 21 U.S.C.section 360bbb-3(b)(1), unless the authorization is terminated  or revoked sooner.       Influenza A by PCR NEGATIVE NEGATIVE Final   Influenza B by PCR NEGATIVE NEGATIVE Final    Comment: (NOTE) The Xpert Xpress SARS-CoV-2/FLU/RSV plus assay is intended as an aid in the diagnosis of influenza from Nasopharyngeal swab specimens and should not be used as a sole basis for treatment. Nasal washings and aspirates are unacceptable for Xpert Xpress SARS-CoV-2/FLU/RSV testing.  Fact Sheet for Patients: EntrepreneurPulse.com.au  Fact Sheet for Healthcare Providers: IncredibleEmployment.be  This test is not yet approved or cleared by the [place] and  has been authorized for detection and/or diagnosis of SARS-CoV-2 by FDA under an Emergency Use Authorization (EUA). This EUA will remain in effect (meaning this test can be used) for the duration of the COVID-19 declaration under Section 564(b)(1) of the Act, 21 U.S.C. section 360bbb-3(b)(1), unless the authorization is terminated or revoked.  Performed at [facility], [place] [address], [place], [place] [address]      Scheduled Meds:  Chlorhexidine Gluconate Cloth  6 each Topical Daily   feeding supplement  237 mL Oral TID BM   gabapentin  900 mg Oral BID   insulin aspart  0-5 Units Subcutaneous QHS   insulin aspart  0-9 Units Subcutaneous TID WC   megestrol  800 mg Oral Daily   mirtazapine  30 mg Oral QHS   polyethylene glycol  17 g Oral Daily   senna  1 tablet Oral BID   Continuous Infusions:  heparin 1,350 Units/hr (10/27/20 0017)   lactated ringers 50 mL/hr at 10/26/20 2138     LOS: 1 day   Cherene Altes, MD [facility] Office  [phone] Pager - Text Page per Shea Evans  If 7PM-7AM, please contact night-coverage per Amion 10/27/2020, 8:44 AM

## 2020-10-27 NOTE — Progress Notes (Signed)
MEWS continues red. Pulse 117, respirations 29.

## 2020-10-27 NOTE — Progress Notes (Signed)
*  PRELIMINARY RESULTS* Echocardiogram 2D Echocardiogram has been performed.  Luisa Hart RDCS 10/27/2020, 8:09 AM

## 2020-10-28 ENCOUNTER — Inpatient Hospital Stay (HOSPITAL_COMMUNITY): Payer: Managed Care, Other (non HMO)

## 2020-10-28 ENCOUNTER — Inpatient Hospital Stay
Admission: RE | Admit: 2020-10-28 | Discharge: 2020-10-28 | Disposition: A | Discharge status: Home / Self Care | Payer: Managed Care, Other (non HMO) | Source: Ambulatory Visit | Attending: Emergency Medicine | Admitting: Emergency Medicine

## 2020-10-28 DIAGNOSIS — R072 Precordial pain: Secondary | ICD-10-CM | POA: Diagnosis not present

## 2020-10-28 DIAGNOSIS — I2699 Other pulmonary embolism without acute cor pulmonale: Secondary | ICD-10-CM | POA: Diagnosis not present

## 2020-10-28 LAB — COMPREHENSIVE METABOLIC PANEL
ALT: 13 U/L (ref 0–44)
AST: 13 U/L — ABNORMAL LOW (ref 15–41)
Albumin: 2.9 g/dL — ABNORMAL LOW (ref 3.5–5.0)
Alkaline Phosphatase: 97 U/L (ref 38–126)
Anion gap: 7 (ref 5–15)
BUN: 8 mg/dL (ref 6–20)
CO2: 25 mmol/L (ref 22–32)
Calcium: 8.8 mg/dL — ABNORMAL LOW (ref 8.9–10.3)
Chloride: 100 mmol/L (ref 98–111)
Creatinine, Ser: 0.5 mg/dL — ABNORMAL LOW (ref 0.61–1.24)
GFR, Estimated: >60 mL/min (ref >60)
Glucose, Bld: 146 mg/dL — ABNORMAL HIGH (ref 70–99)
Potassium: 4.1 mmol/L (ref 3.5–5.1)
Sodium: 132 mmol/L — ABNORMAL LOW (ref 135–145)
Total Bilirubin: 0.8 mg/dL (ref 0.3–1.2)
Total Protein: 6.7 g/dL (ref 6.5–8.1)

## 2020-10-28 LAB — CBC
HCT: 26.6 % — ABNORMAL LOW (ref 39.0–52.0)
Hemoglobin: 8.9 g/dL — ABNORMAL LOW (ref 13.0–17.0)
MCH: 33.1 pg (ref 26.0–34.0)
MCHC: 33.5 g/dL (ref 30.0–36.0)
MCV: 98.9 fL (ref 80.0–100.0)
Platelets: 140 10*3/uL — ABNORMAL LOW (ref 150–400)
RBC: 2.69 MIL/uL — ABNORMAL LOW (ref 4.22–5.81)
RDW: 17.6 % — ABNORMAL HIGH (ref 11.5–15.5)
WBC: 7.8 10*3/uL (ref 4.0–10.5)
nRBC: 0 % (ref 0.0–0.2)

## 2020-10-28 LAB — GLUCOSE, CAPILLARY
Glucose-Capillary: 142 mg/dL — ABNORMAL HIGH (ref 70–99)
Glucose-Capillary: 189 mg/dL — ABNORMAL HIGH (ref 70–99)
Glucose-Capillary: 245 mg/dL — ABNORMAL HIGH (ref 70–99)
Glucose-Capillary: 276 mg/dL — ABNORMAL HIGH (ref 70–99)

## 2020-10-28 LAB — HEPARIN LEVEL (UNFRACTIONATED)
Heparin Unfractionated: 0.26 IU/mL — ABNORMAL LOW (ref 0.30–0.70)
Heparin Unfractionated: 0.35 IU/mL (ref 0.30–0.70)

## 2020-10-28 LAB — LACTIC ACID, PLASMA: Lactic Acid, Venous: 1.2 mmol/L (ref 0.5–1.9)

## 2020-10-28 LAB — TROPONIN I (HIGH SENSITIVITY): Troponin I (High Sensitivity): 10 ng/L (ref <18)

## 2020-10-28 IMAGING — DX DG CHEST 1V PORT
1 series · 1 of 1 positions shown · non-contrast
Comparison: [DATE]

CLINICAL DATA: Chest pain

EXAM:
PORTABLE CHEST 1 VIEW

[chest ap]
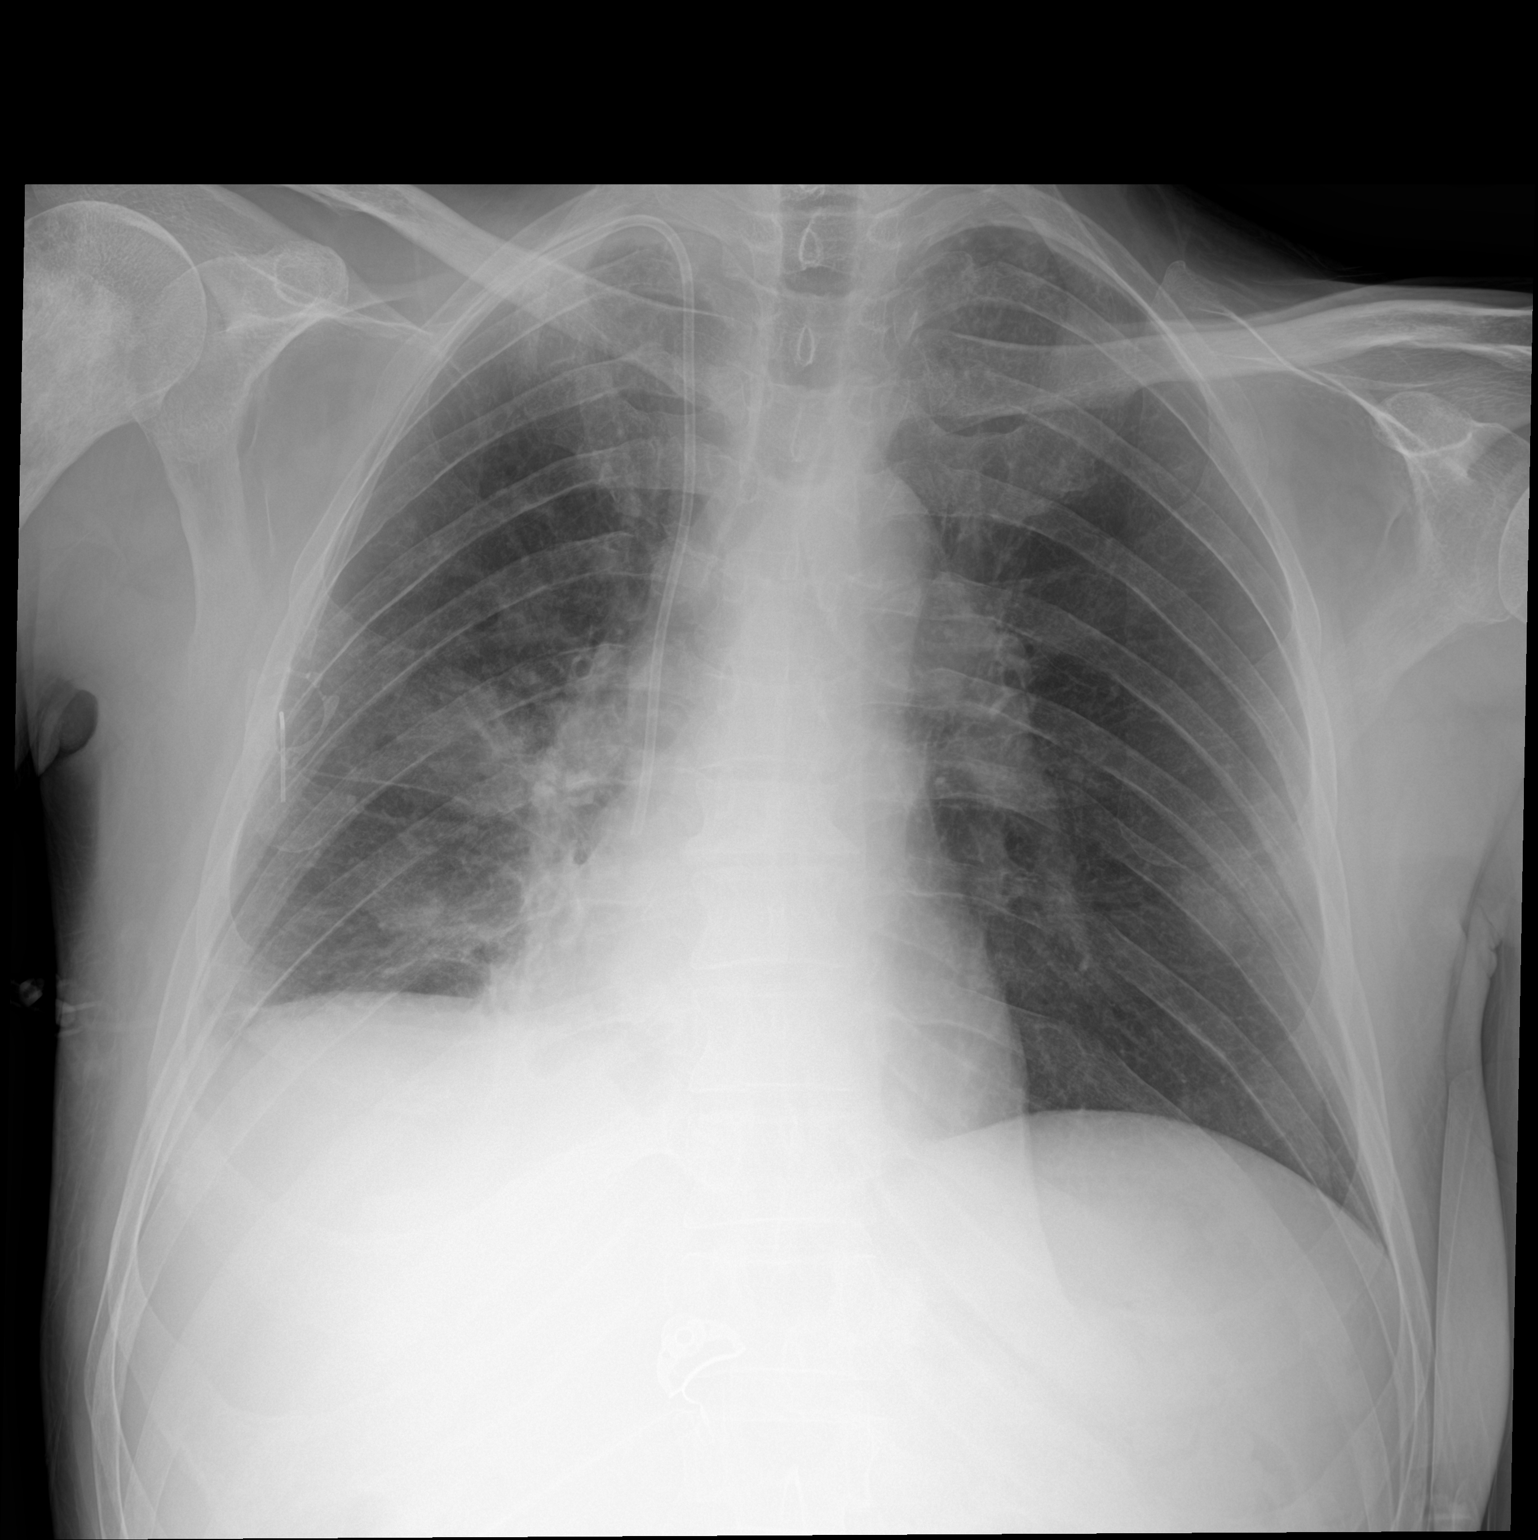

[1 of 1 positions shown; findings below may reference images not displayed]

FINDINGS: Right-sided chest port terminates at the level of the superior
cavoatrial junction. Normal heart size. Increasing opacity within
the inferior aspect of the right upper lobe. Nodular opacities
persist within the right lung. Small right pleural effusion. No
pneumothorax. Osseous metastatic disease including proximal right
humerus and anterior left fourth rib, better seen on previous day
CT.
IMPRESSION: 1. Increasing opacity within the inferior aspect of the right upper
lobe suspicious for pneumonia.
2. Small right pleural effusion.

## 2020-10-28 MED ORDER — ENOXAPARIN (LOVENOX) PATIENT EDUCATION KIT
PACK | Freq: Once | Status: AC
  Filled 2020-10-28: qty 1

## 2020-10-28 MED ORDER — APIXABAN 5 MG PO TABS: 5.0000 mg | ORAL_TABLET | Freq: Two times a day (BID) | ORAL | Status: DC

## 2020-10-28 MED ORDER — APIXABAN 5 MG PO TABS
10.0000 mg | ORAL_TABLET | Freq: Two times a day (BID) | ORAL | Status: DC
Start: 2020-10-29 — End: 2020-10-28

## 2020-10-28 MED ORDER — ENOXAPARIN SODIUM 60 MG/0.6ML IJ SOSY
60.0000 mg | PREFILLED_SYRINGE | Freq: Two times a day (BID) | INTRAMUSCULAR | Status: DC
  Administered 2020-10-28 – 2020-10-30 (×4): 60 mg via SUBCUTANEOUS
  Filled 2020-10-28 (×5): qty 0.6

## 2020-10-28 NOTE — Progress Notes (Signed)
NAME:  Albert Flowers, MRN:  [id number], DOB:  10/25/1970, LOS: 2 ADMISSION DATE:  10/26/2020, CONSULTATION DATE:  10/26/20 REFERRING MD:  Dr Langston Masker, CHIEF COMPLAINT: Pulmonary embolism  History of Present Illness:  Patient with a history of head and neck cancer with metastatic disease-base of tongue cancer diagnosed in 2000-currently on chemotherapy Came into the hospital with shortness of breath, chest discomfort, lightheadedness which started abruptly at work Cough Recently treated for pneumonia Undergoing evaluation for persistent lung nodule for possible bronchoscopy-concern for metastatic disease to the lungs versus primary lung cancer  Pertinent  Medical History   Past Medical History:  Diagnosis Date   Diabetes mellitus without complication ([place])    Malignant neoplasm of base of tongue ([place]) 06/02/2018   Significant Hospital Events: Including procedures, antibiotic start and stop dates in addition to other pertinent events   CT scan of chest significant for large lung clot CTA negative for metastatic disease  Interim History / Subjective:   8/12  - on room air pulse ox 98%, HR 103. Resting. Wife at bedsidd. 87mn ago had brief left precordial pain , sharp moderate and resolved. No diaphoresis. No syncrope. EKOS cancelled due to improivmeent  Objective   Blood pressure 108/76, pulse 93, temperature 98.2 F (36.8 C), temperature source Oral, resp. rate (!) 32, height '5\' 10"'$  (1.778 m), weight 67.1 kg, SpO2 96 %.        Intake/Output Summary (Last 24 hours) at 10/28/2020 1057 Last data filed at 10/28/2020 0400 Gross per 24 hour  Intake 1176.47 ml  Output --  Net 1176.47 ml   Filed Weights   10/26/20 1334 10/26/20 1436  Weight: 68 kg 67.1 kg    Examination: General Appearance:  Looks emacitated. Cachexia Head:  Normocephalic, without obvious abnormality, atraumatic Eyes:  PERRL - yes, conjunctiva/corneas - muddy     Ears:  Normal external ear canals, both ears Nose:   G tube - no Throat:  ETT TUBE - no , OG tube - no Neck:  Supple,  No enlargement/tenderness/nodules Lungs: Clear to auscultation bilaterally Heart:  S1 and S2 normal, no murmur, CVP - no.  Pressors - np Abdomen:  Soft, no masses, no organomegaly Genitalia / Rectal:  Not done Extremities:  Extremities- no Skin:  ntact in exposed areas . Sacral area - not examined Neurologic:  Sedation - none -> RASS - +1 . Moves all 4s - yes. CAM-ICU - neg . 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 Submassive PE  On 10/28/2020 improved and on Room Air but had new chest pain   Plan  =- IV heparin for total 72h at minimum (max 5d) and then if stable -> DOAC long term to follow with onc  - For current chest pain - get cxr, ekg, trop, lactate -> [facility] informed    CCM will sign off     SIGNATURE    Dr. MBrand Males M.D., F.C.C.P,  Pulmonary and Critical Care Medicine Staff Physician, CPerryDirector - Interstitial Lung Disease  Program  Pulmonary [place] [address] NPI Number:  NPI #[id number] Pager: [phone]  If no answer  -> Check AMION or Try [phone] Telephone (clinical office): [phone] Telephone (research): [phone]  11:13 AM 10/28/2020

## 2020-10-28 NOTE — Progress Notes (Addendum)
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  This was accompanied by palpitations/tachycardia.  On presentation his room air pulse ox was noted to be in the 70s.  CTa in the ED noted a large left-sided pulmonary embolism with evidence of right heart strain.  Significant Events:  8/10 admit w/ PE 8/11 TTE - preserved EF - no evidence of R heart strain   Consultants:  PCCM IR  Code Status: FULL CODE  Antimicrobials:  none  DVT prophylaxis: IV heparin   Subjective: Heart rate improving and now consistently less than 100.  Blood pressure stable in mid 90s to low [id number] systolic.  Afebrile.  Oxygen saturations mid to upper 90s on room air.  The patient still feels quite weak in general, and becomes short of breath with slight exertion.  Oncology following the patient and recommends transitioning to Lovenox for now with possible change to DOAC in outpatient setting eventually.  Assessment & Plan:  Acute pulmonary embolism with right heart strain Transition IV heparin to Lovenox today - with TTE noting preserved RV function patient is no longer felt to be an appropriate candidate for catheter directed tPA -began to ambulate - plan to transition to [place] in outpatient setting per hematology   Acute hypoxic respiratory failure Due to above -significantly worse with exertion -monitor saturations -may require supplemental oxygen temporarily at time of discharge   Metastatic squamous cell  carcinoma of the head and neck to bone Followed by Dr. Delories Heinz who has been consulted this admit   ?RUL infiltrate Noted on CXR today (which was ordered by PCCM due to chest pain) - no clinical sign of pneumonia presently - follow clinically    Recent recurrent pneumonias No clinical evidence of acute bacterial infection at present   Severe protein calorie malnutrition Due to metastatic cancer -nutrition to follow   Possible lung cancer Persistent lung nodule undergoing work-up with planning for possible bronchoscopy -metastatic disease versus primary lung cancer -PCCM following  DM2 CBG well controlled at this time    Family Communication: Spoke with wife at bedside Status is: Inpatient  Remains inpatient appropriate because:Inpatient level of care appropriate due to severity of illness  Dispo: The patient is from: Home              Anticipated d/c is to: Home              Patient currently is not medically stable to d/c.   Difficult to place patient No     Objective: Blood pressure 108/76, pulse 93, temperature 98.2 F (36.8 C), temperature source Oral, resp. rate (!) 32, height '5\' 10"'$  (1.778 m), weight 67.1 kg, SpO2 96 %.  Intake/Output Summary (Last 24 hours) at 10/28/2020 0808 Last data filed at 10/28/2020 0400 Gross per 24 hour  Intake 1600.2 ml  Output 475 ml  Net 1125.2 ml    Filed Weights   10/26/20 1334 10/26/20 1436  Weight: 68 kg 67.1 kg    Examination: General: No acute respiratory distress Lungs: Scattered crackles in left fields with no  wheeze Cardiovascular: Tachycardic but regular -heart rate improving -no gallop or rub Abdomen: NT/ND, soft, BS positive Extremities: No significant cyanosis, clubbing, or edema bilateral lower extremities  CBC: Recent Labs  Lab 10/26/20 1439 10/27/20 0538 10/27/20 2201 10/28/20 0350  WBC 9.2 8.8 7.7 7.8  NEUTROABS 7.5  --   --   --   HGB 11.0* 9.0* 8.9* 8.9*  HCT 34.1* 27.4* 26.8* 26.6*  MCV 102.1* 100.0  99.3 98.9  PLT 208 166 148* 140*    Basic Metabolic Panel: Recent Labs  Lab 10/26/20 1439 10/27/20 0538 10/28/20 0350  NA 136 137 132*  K 3.5 4.1 4.1  CL 97* 99 100  CO2 '26 26 25  '$ GLUCOSE 211* 191* 146*  BUN '11 7 8  '$ CREATININE 0.59* 0.44* 0.50*  CALCIUM 9.3 9.0 8.8*    GFR: Estimated Creatinine Clearance: 103.7 mL/min (A) (by C-G formula based on SCr of 0.5 mg/dL (L)).  Liver Function Tests: Recent Labs  Lab 10/27/20 0538 10/28/20 0350  AST 16 13*  ALT 12 13  ALKPHOS 97 97  BILITOT 0.4 0.8  PROT 6.5 6.7  ALBUMIN 3.1* 2.9*      HbA1C: Hgb A1c MFr Bld  Date/Time Value Ref Range Status  10/05/2020 09:47 AM 8.5 (H) 4.8 - 5.6 % Final    Comment:    (NOTE) Pre diabetes:          5.7%-6.4%  Diabetes:              >6.4%  Glycemic control for   <7.0% adults with diabetes   09/18/2020 12:40 AM 7.7 (H) 4.8 - 5.6 % Final    Comment:    (NOTE) Pre diabetes:          5.7%-6.4%  Diabetes:              >6.4%  Glycemic control for   <7.0% adults with diabetes     CBG: Recent Labs  Lab 10/27/20 0758 10/27/20 1156 10/27/20 1647 10/27/20 2056 10/28/20 0719  GLUCAP 156* 99 132* 243* 142*     Recent Results (from the past 240 hour(s))  Resp Panel by RT-PCR (Flu A&B, Covid) Nasopharyngeal Swab     Status: None   Collection Time: 10/26/20  3:57 PM   Specimen: Nasopharyngeal Swab; Nasopharyngeal(NP) swabs in vial transport medium  Result Value Ref Range Status   SARS Coronavirus 2 by RT PCR NEGATIVE NEGATIVE Final    Comment: (NOTE) SARS-CoV-2 target nucleic acids are NOT DETECTED.  The SARS-CoV-2 RNA is generally detectable in upper respiratory specimens during the acute phase of infection. The lowest concentration of SARS-CoV-2 viral copies this assay can detect is 138 copies/mL. A negative result does not preclude SARS-Cov-2 infection and should not be used as the sole basis for treatment or other patient management decisions. A negative result may  occur with  improper specimen collection/handling, submission of specimen other than nasopharyngeal swab, presence of viral mutation(s) within the areas targeted by this assay, and inadequate number of viral copies(<138 copies/mL). A negative result must be combined with clinical observations, patient history, and epidemiological information. The expected result is Negative.  Fact Sheet for Patients:  EntrepreneurPulse.com.au  Fact Sheet for Healthcare Providers:  IncredibleEmployment.be  This test is no t yet approved or cleared by the [place] FDA and  has been authorized for detection and/or diagnosis of SARS-CoV-2 by FDA under an Emergency Use Authorization (EUA). This EUA will remain  in effect (meaning this test can be used)  for the duration of the COVID-19 declaration under Section 564(b)(1) of the Act, 21 U.S.C.section 360bbb-3(b)(1), unless the authorization is terminated  or revoked sooner.       Influenza A by PCR NEGATIVE NEGATIVE Final   Influenza B by PCR NEGATIVE NEGATIVE Final    Comment: (NOTE) The Xpert Xpress SARS-CoV-2/FLU/RSV plus assay is intended as an aid in the diagnosis of influenza from Nasopharyngeal swab specimens and should not be used as a sole basis for treatment. Nasal washings and aspirates are unacceptable for Xpert Xpress SARS-CoV-2/FLU/RSV testing.  Fact Sheet for Patients: EntrepreneurPulse.com.au  Fact Sheet for Healthcare Providers: IncredibleEmployment.be  This test is not yet approved or cleared by the [place] FDA and has been authorized for detection and/or diagnosis of SARS-CoV-2 by FDA under an Emergency Use Authorization (EUA). This EUA will remain in effect (meaning this test can be used) for the duration of the COVID-19 declaration under Section 564(b)(1) of the Act, 21 U.S.C. section 360bbb-3(b)(1), unless the authorization is terminated  or revoked.  Performed at [facility], [place] [address], [place], [place] [address]       Scheduled Meds:  Chlorhexidine Gluconate Cloth  6 each Topical Daily   feeding supplement  237 mL Oral TID BM   gabapentin  900 mg Oral BID   insulin aspart  0-5 Units Subcutaneous QHS   insulin aspart  0-9 Units Subcutaneous TID WC   megestrol  800 mg Oral Daily   mirtazapine  30 mg Oral QHS   polyethylene glycol  17 g Oral Daily   senna  1 tablet Oral BID   Continuous Infusions:  heparin 1,350 Units/hr (10/27/20 2227)   lactated ringers 50 mL/hr at 10/27/20 1706     LOS: 2 days   Cherene Altes, MD [facility] Office  [phone] Pager - Text Page per Shea Evans  If 7PM-7AM, please contact night-coverage per Amion 10/28/2020, 8:08 AM

## 2020-10-28 NOTE — Progress Notes (Addendum)
[facility] for heparin>> Apixaban>>[facility] Indication: pulmonary embolus  Allergies  Allergen Reactions   Penicillins Other (See Comments)    Unknown reaction- from childhood?    Patient Measurements: Height: _0  (177.8 cm) Weight: 67.1 kg (148 lb) IBW/kg (Calculated) : 73 Heparin Dosing Weight: 67.1 kg  Vital Signs: Temp: 98.2 F (36.8 C) (08/12 0600) Temp Source: Oral (08/12 0600) BP: 108/76 (08/12 0400) Pulse Rate: 93 (08/12 0600)  Labs: Recent Labs    10/26/20 1439 10/26/20 2220 10/27/20 0538 10/27/20 0711 10/27/20 1435 10/27/20 2201 10/28/20 0350 10/28/20 1003  HGB 11.0*  --  9.0*  --   --  8.9* 8.9*  --   HCT 34.1*  --  27.4*  --   --  26.8* 26.6*  --   PLT 208  --  166  --   --  148* 140*  --   HEPARINUNFRC  --    < > 0.36   < > 0.53  --  0.26* 0.35  CREATININE 0.59*  --  0.44*  --   --   --  0.50*  --   TROPONINIHS 54*  59*  --   --   --   --   --   --   --    < > = values in this interval not displayed.     Estimated Creatinine Clearance: 103.7 mL/min (A) (by C-G formula based on SCr of 0.5 mg/dL (L)).   Medical History: Past Medical History:  Diagnosis Date   Diabetes mellitus without complication ([place])    Malignant neoplasm of base of tongue ([place]) 06/02/2018    Medications:  Scheduled:   [START ON 10/29/2020] apixaban  10 mg Oral BID   Followed by   Derrill Memo ON 11/05/2020] apixaban  5 mg Oral BID   Chlorhexidine Gluconate Cloth  6 each Topical Daily   feeding supplement  237 mL Oral TID BM   gabapentin  900 mg Oral BID   insulin aspart  0-5 Units Subcutaneous QHS   insulin aspart  0-9 Units Subcutaneous TID WC   megestrol  800 mg Oral Daily   mirtazapine  30 mg Oral QHS   polyethylene glycol  17 g Oral Daily   senna  1 tablet Oral BID   Infusions:   heparin 1,350 Units/hr (10/27/20 2227)   lactated ringers 50 mL/hr at 10/27/20 1706    Assessment: 50 yo male presenting with shortness of breath,  weakness, tachycardia. 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 CTA shows large L PE w/ RH strain and Pharmacy consulted to dose heparin.  Baseline INR, aPTT: not done Prior anticoagulation: none  Significant events:  Today, 10/28/2020: 0350 am HL 0.26 sub-therapeutic on 1350 units/hr- Per RN heparin held ~ 2 hours earlier tonight, no bleeding CBC: Hgb down from admission, likely dilutional; Plt also down to 140 10 am heparin level 0.35 - therapeutic on 1350 units/hr, no bleeding reported To transition to apixaban 8/13 AM per MD request  Goal of Therapy: Heparin level 0.3-0.7 units/ml Monitor platelets by anticoagulation protocol: Yes  Plan: Continue heparin drip at 1350 units/hr until 10/13 at 10 am then transition to apixaban 10 mg po bid x 7 days followed by apixaban 5 mg po bid Will provide apixaban education & 30 day free card today Daily CBC, daily heparin level tomorrow am, then DC Monitor for signs of bleeding or thrombosis   Sharyn Lull T.  Abrielle Finck, Pharm.D 10/28/2020 12:18 PM  Addendum: To transition heparin drip to lovenox.  Pt wt 67 kg> OK w/  Dr Chryl Heck to round down to 60 mg for pt convenience of syringe size.  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

## 2020-10-28 NOTE — Progress Notes (Signed)
ANTICOAGULATION CONSULT NOTE   Pharmacy Consult for heparin Indication: pulmonary embolus  Allergies  Allergen Reactions   Penicillins Other (See Comments)    Unknown reaction- from childhood?    Patient Measurements: Height: '5\' 10"'$  (177.8 cm) Weight: 67.1 kg (148 lb) IBW/kg (Calculated) : 73 Heparin Dosing Weight: 67.1 kg  Vital Signs: Temp: 98.3 F (36.8 C) (08/11 2200) Temp Source: Oral (08/11 2200) BP: 100/73 (08/12 0000) Pulse Rate: 97 (08/12 0204)  Labs: Recent Labs    10/26/20 1439 10/26/20 2220 10/27/20 0538 10/27/20 0711 10/27/20 1435 10/27/20 2201 10/28/20 0350  HGB 11.0*  --  9.0*  --   --  8.9* 8.9*  HCT 34.1*  --  27.4*  --   --  26.8* 26.6*  PLT 208  --  166  --   --  148* 140*  HEPARINUNFRC  --    < > 0.36 0.44 0.53  --  0.26*  CREATININE 0.59*  --  0.44*  --   --   --  0.50*  TROPONINIHS 54*  59*  --   --   --   --   --   --    < > = values in this interval not displayed.     Estimated Creatinine Clearance: 103.7 mL/min (A) (by C-G formula based on SCr of 0.5 mg/dL (L)).   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 CTA shows large L PE w/ RH strain and Pharmacy  consulted to dose heparin.  Baseline INR, aPTT: not done Prior anticoagulation: none  Significant events:  Today, 10/28/2020: HL 0.26 sub-therapeutic on 1350 units/hr CBC: Hgb down from admission, likely dilutional; Plt also down to 140 Per RN heparin held ~ 2 hours earlier tonight, no bleeding  Goal of Therapy: Heparin level 0.3-0.7 units/ml Monitor platelets by anticoagulation protocol: Yes  Plan: Continue heparin drip at 1350 units/hr since heparin off for ~ 2 hours early in the shift Recheck heparin level in 6 hrs Daily CBC, daily heparin level once stable Monitor for signs of bleeding or thrombosis  Dolly Rias RPh 10/28/2020, 4:27 AM

## 2020-10-29 DIAGNOSIS — C799 Secondary malignant neoplasm of unspecified site: Secondary | ICD-10-CM

## 2020-10-29 DIAGNOSIS — R638 Other symptoms and signs concerning food and fluid intake: Secondary | ICD-10-CM

## 2020-10-29 DIAGNOSIS — E1165 Type 2 diabetes mellitus with hyperglycemia: Secondary | ICD-10-CM

## 2020-10-29 DIAGNOSIS — R911 Solitary pulmonary nodule: Secondary | ICD-10-CM

## 2020-10-29 DIAGNOSIS — E43 Unspecified severe protein-calorie malnutrition: Secondary | ICD-10-CM

## 2020-10-29 DIAGNOSIS — R531 Weakness: Secondary | ICD-10-CM

## 2020-10-29 LAB — CBC
HCT: 26 % — ABNORMAL LOW (ref 39.0–52.0)
Hemoglobin: 8.7 g/dL — ABNORMAL LOW (ref 13.0–17.0)
MCH: 33.1 pg (ref 26.0–34.0)
MCHC: 33.5 g/dL (ref 30.0–36.0)
MCV: 98.9 fL (ref 80.0–100.0)
Platelets: 147 10*3/uL — ABNORMAL LOW (ref 150–400)
RBC: 2.63 MIL/uL — ABNORMAL LOW (ref 4.22–5.81)
RDW: 17.4 % — ABNORMAL HIGH (ref 11.5–15.5)
WBC: 6.6 10*3/uL (ref 4.0–10.5)
nRBC: 0 % (ref 0.0–0.2)

## 2020-10-29 LAB — GLUCOSE, CAPILLARY
Glucose-Capillary: 139 mg/dL — ABNORMAL HIGH (ref 70–99)
Glucose-Capillary: 258 mg/dL — ABNORMAL HIGH (ref 70–99)
Glucose-Capillary: 202 mg/dL — ABNORMAL HIGH (ref 70–99)
Glucose-Capillary: 171 mg/dL — ABNORMAL HIGH (ref 70–99)

## 2020-10-29 MED ORDER — INSULIN DETEMIR 100 UNIT/ML ~~LOC~~ SOLN
5.0000 [IU] | Freq: Two times a day (BID) | SUBCUTANEOUS | Status: DC
  Administered 2020-10-29 – 2020-10-30 (×2): 5 [IU] via SUBCUTANEOUS
  Filled 2020-10-29 (×2): qty 0.05

## 2020-10-29 NOTE — Progress Notes (Signed)
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  Patient was started on IV heparin.  Pulmonology and IR consulted and did not feel thrombectomy/thrombolysis was indicated.  Hematology recommended discharge on subcu Lovenox with a plan to transition to [facility] outpatient.  Subjective: Seen and examined earlier this morning.  No major events overnight of this morning.  Continues to endorse DOE.  Per patient's wife, he desaturated to 33s with ambulation this morning.  Had soft blood pressures earlier this morning.  Also reports chest pain.  Denies GI or UTI symptoms.  Objective: Vitals:   10/28/20 2104 10/29/20 0025 10/29/20 0400 10/29/20 1216  BP: (!) 140/96 (!) '82/53 97/77 97/73 '$  Pulse:  (!) 103 (!) 104 (!) 108  Resp:  (!) 21 (!) 25 20  Temp: 98.5 F (36.9 C)   98.2 F (36.8 C)  TempSrc: Oral   Oral  SpO2:  97% 97% 100%  Weight:      Height:        Intake/Output Summary (Last 24 hours) at 10/29/2020 1811 Last data filed at 10/29/2020 1700 Gross per 24 hour  Intake 994.47 ml  Output 200 ml  Net 794.47 ml   Filed Weights   10/26/20 1334 10/26/20 1436  Weight: 68 kg 67.1 kg    Examination:  GENERAL: No apparent distress.  Nontoxic. HEENT: MMM.  Vision and hearing grossly intact.  NECK:  Supple.  No apparent JVD.  RESP: 97% on RA at rest.  None No IWOB.  Fair aeration bilaterally. CVS:  RRR. Heart sounds normal.  ABD/GI/GU: BS+. Abd soft, NTND.  MSK/EXT:  Moves extremities. No apparent deformity. No edema.  SKIN: no apparent skin lesion or wound NEURO: Awake, alert and oriented appropriately.  No apparent focal neuro deficit. PSYCH: Calm. Normal affect.   Procedures:  None  Microbiology summarized: [id number] and influenza PCR nonreactive.  Assessment & Plan: Acute pulmonary embolism with cor pulmonale: Both malignancy and Megace could increase your risk.  Was on IV heparin.  Transitioned to subcu Lovenox -PCCM and IR did not feel thrombolysis is indicated at this time -Hematology recommended discharge on Lovenox with a plan to transition to [facility] outpatient.  Acute hypoxic respiratory failure: Due to the above.  Ambulated and desaturated to 60s this morning. 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 Delories Heinz  -Outpatient follow-up.  RUL infiltrate-patient has no new respiratory symptoms, fever or leukocytosis.  Likely residual from his recent HCAP last month.  -Hold off antibiotics    Possible lung cance: persistent lung nodule undergoing work-up with planning for possible bronchoscopy likely outpatient.   Uncontrolled NIDDM-2 with hyperglycemia: A1c 8.5% on 7/20. Recent Labs  Lab 10/28/20 1601 10/28/20 2101 10/29/20 0758 10/29/20 1213 10/29/20 1637  GLUCAP  245* 276* 139* 258* 202*  -Continue SSI-sensitive -Add Levemir 5 units twice daily starting tonight  Severe protein calorie malnutrition Body mass index is 21.24 kg/m.  -Consult dietitian -Continue Remeron -Patient is on Megace-can increase risk of VTE.       DVT prophylaxis:    On therapeutic Lovenox  Code Status: Full code Family Communication: Patient and/or RN.  Updated patient's wife at bedside Level of  care: Telemetry Status is: Inpatient  Remains inpatient appropriate because:Unsafe d/c plan and Inpatient level of care appropriate due to severity of illness  Dispo: The patient is from: Home              Anticipated d/c is to: Home              Patient currently is not medically stable to d/c.   Difficult to place patient No       Consultants:  PCCM Interventional radiology Hematology/oncology   Sch Meds:  Scheduled Meds:  Chlorhexidine Gluconate Cloth  6 each Topical Daily   enoxaparin (LOVENOX) injection  60 mg Subcutaneous Q12H   feeding supplement  237 mL Oral TID BM   gabapentin  900 mg Oral BID   insulin aspart  0-5 Units Subcutaneous QHS   insulin aspart  0-9 Units Subcutaneous TID WC   megestrol  800 mg Oral Daily   mirtazapine  30 mg Oral QHS   polyethylene glycol  17 g Oral Daily   senna  1 tablet Oral BID   Continuous Infusions: PRN Meds:.acetaminophen, albuterol, bisacodyl, diphenhydrAMINE, lidocaine, lip balm, ondansetron, oxyCODONE, sodium chloride flush  Antimicrobials: Anti-infectives (From admission, onward)    None        I have personally reviewed the following labs and images: CBC: Recent Labs  Lab 10/26/20 1439 10/27/20 0538 10/27/20 2201 10/28/20 0350 10/29/20 0219  WBC 9.2 8.8 7.7 7.8 6.6  NEUTROABS 7.5  --   --   --   --   HGB 11.0* 9.0* 8.9* 8.9* 8.7*  HCT 34.1* 27.4* 26.8* 26.6* 26.0*  MCV 102.1* 100.0 99.3 98.9 98.9  PLT 208 166 148* 140* 147*   BMP &GFR Recent Labs  Lab 10/26/20 1439 10/27/20 0538 10/28/20 0350  NA 136 137 132*  K 3.5 4.1 4.1  CL 97* 99 100  CO2 '26 26 25  '$ GLUCOSE 211* 191* 146*  BUN '11 7 8  '$ CREATININE 0.59* 0.44* 0.50*  CALCIUM 9.3 9.0 8.8*   Estimated Creatinine Clearance: 103.7 mL/min (A) (by C-G formula based on SCr of 0.5 mg/dL (L)). Liver & Pancreas: Recent Labs  Lab 10/27/20 0538 10/28/20 0350  AST 16 13*  ALT 12 13  ALKPHOS 97 97  BILITOT 0.4 0.8  PROT 6.5 6.7  ALBUMIN 3.1*  2.9*   No results for input(s): LIPASE, AMYLASE in the last 168 hours. No results for input(s): AMMONIA in the last 168 hours. Diabetic: No results for input(s): HGBA1C in the last 72 hours. Recent Labs  Lab 10/28/20 1601 10/28/20 2101 10/29/20 0758 10/29/20 1213 10/29/20 1637  GLUCAP 245* 276* 139* 258* 202*   Cardiac Enzymes: No results for input(s): CKTOTAL, CKMB, CKMBINDEX, TROPONINI in the last 168 hours. No results for input(s): PROBNP in the last 8760 hours. Coagulation Profile: No results for input(s): INR, PROTIME in the last 168 hours. Thyroid Function Tests: No results for input(s): TSH, T4TOTAL, FREET4, T3FREE, THYROIDAB in the last 72 hours. Lipid Profile: No results for input(s): CHOL, HDL, LDLCALC, TRIG, CHOLHDL, LDLDIRECT in the last  72 hours. Anemia Panel: No results for input(s): VITAMINB12, FOLATE, FERRITIN, TIBC, IRON, RETICCTPCT in the last 72 hours. Urine analysis:    Component Value Date/Time   COLORURINE STRAW (A) 10/26/2020 1811   APPEARANCEUR CLEAR 10/26/2020 1811   LABSPEC 1.017 10/26/2020 1811   PHURINE 7.0 10/26/2020 1811   GLUCOSEU NEGATIVE 10/26/2020 1811   HGBUR NEGATIVE 10/26/2020 1811   BILIRUBINUR NEGATIVE 10/26/2020 1811   KETONESUR NEGATIVE 10/26/2020 1811   PROTEINUR NEGATIVE 10/26/2020 1811   NITRITE NEGATIVE 10/26/2020 1811   LEUKOCYTESUR NEGATIVE 10/26/2020 1811   Sepsis Labs: Invalid input(s): PROCALCITONIN, [place]  Microbiology: Recent Results (from the past 240 hour(s))  Resp Panel by RT-PCR (Flu A&B, Covid) Nasopharyngeal Swab     Status: None   Collection Time: 10/26/20  3:57 PM   Specimen: Nasopharyngeal Swab; Nasopharyngeal(NP) swabs in vial transport medium  Result Value Ref Range Status   SARS Coronavirus 2 by RT PCR NEGATIVE NEGATIVE Final    Comment: (NOTE) SARS-CoV-2 target nucleic acids are NOT DETECTED.  The SARS-CoV-2 RNA is generally detectable in upper respiratory specimens during the acute phase of  infection. The lowest concentration of SARS-CoV-2 viral copies this assay can detect is 138 copies/mL. A negative result does not preclude SARS-Cov-2 infection and should not be used as the sole basis for treatment or other patient management decisions. A negative result may occur with  improper specimen collection/handling, submission of specimen other than nasopharyngeal swab, presence of viral mutation(s) within the areas targeted by this assay, and inadequate number of viral copies(<138 copies/mL). A negative result must be combined with clinical observations, patient history, and epidemiological information. The expected result is Negative.  Fact Sheet for Patients:  EntrepreneurPulse.com.au  Fact Sheet for Healthcare Providers:  IncredibleEmployment.be  This test is no t yet approved or cleared by the [place] FDA and  has been authorized for detection and/or diagnosis of SARS-CoV-2 by FDA under an Emergency Use Authorization (EUA). This EUA will remain  in effect (meaning this test can be used) for the duration of the COVID-19 declaration under Section 564(b)(1) of the Act, 21 U.S.C.section 360bbb-3(b)(1), unless the authorization is terminated  or revoked sooner.       Influenza A by PCR NEGATIVE NEGATIVE Final   Influenza B by PCR NEGATIVE NEGATIVE Final    Comment: (NOTE) The Xpert Xpress SARS-CoV-2/FLU/RSV plus assay is intended as an aid in the diagnosis of influenza from Nasopharyngeal swab specimens and should not be used as a sole basis for treatment. Nasal washings and aspirates are unacceptable for Xpert Xpress SARS-CoV-2/FLU/RSV testing.  Fact Sheet for Patients: EntrepreneurPulse.com.au  Fact Sheet for Healthcare Providers: IncredibleEmployment.be  This test is not yet approved or cleared by the [place] FDA and has been authorized for detection and/or diagnosis of SARS-CoV-2  by FDA under an Emergency Use Authorization (EUA). This EUA will remain in effect (meaning this test can be used) for the duration of the COVID-19 declaration under Section 564(b)(1) of the Act, 21 U.S.C. section 360bbb-3(b)(1), unless the authorization is terminated or revoked.  Performed at [facility], [place] [address] [place], [place] [address]     Radiology Studies: No results found.     Emir Nack T. [place]  If 7PM-7AM, please contact night-coverage [email or web address] 10/29/2020, 6:11 PM

## 2020-10-29 NOTE — Evaluation (Addendum)
Physical Therapy Evaluation Patient Details Name: Albert Flowers MRN: [id number] DOB: Feb 26, 1971 Today's Date: 10/29/2020   History of Present Illness  50 y.o. male who is admitted to [facility] on 10/26/2020 with Large PE and RT heart strain.  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  Clinical Impression  Pt admitted with above diagnosis. Pt and wife are not interested in any further PT at this time.  They were given some exercises he can do for continued strengthening and they have been walking in the hall together.  He would benefit from a rollator and a 3-1 BSC.    Follow Up Recommendations No PT follow up    Equipment Recommendations  3in1 (PT);Other (comment) (rollator)    Recommendations for Other Services       Precautions / Restrictions Precautions Precaution Comments: Mon HR, SpO2.  HR elevation to upper 120s after ambulation. SpO2 staying over 92% with all activity. Restrictions Weight Bearing Restrictions: No      Mobility  Bed Mobility Overal bed mobility: Modified Independent             General bed mobility comments: HOB partialy elevated, no use of rails, slow moving but without need of cues or assistance.    Transfers Overall transfer level: Modified independent Equipment used: Rolling walker (2 wheeled)             General transfer comment: requested to use RW  Ambulation/Gait Ambulation/Gait assistance: Min Gaffer (Feet): 500 Feet Assistive device: Rolling walker (2 wheeled) Gait Pattern/deviations: Step-through pattern     General Gait Details: Steady and safe with RW, o2 in 90's throughout  HR up to low 120s  Stairs            Wheelchair Mobility    Modified Rankin (Stroke Patients Only)       Balance Overall balance assessment: Mild  deficits observed, not formally tested                                           Pertinent Vitals/Pain Pain Assessment: No/denies pain Faces Pain Scale: Hurts a little bit Pain Location: Pt denied pain but noted facial grimace when moving from supine to sit.    Home Living Family/patient expects to be discharged to:: Private residence Living Arrangements: Spouse/significant other;Children Available Help at Discharge: Family;Available 24 hours/day Type of Home: House Home Access: Stairs to enter Entrance Stairs-Rails: Right Entrance Stairs-Number of Steps: 6 Home Layout: Multi-level Home Equipment: Cane - single point;Shower seat;Hand held shower head;Wheelchair - manual      Prior Function Level of Independence: Independent with assistive device(s)         Comments: independent ADLs, uses cane for longer walks and for stairs, denies falls in past 1 year. 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 Pt reports that his family is going on vacation soon in [place].     Hand Dominance   Dominant Hand: Right    Extremity/Trunk Assessment   Upper Extremity Assessment Upper Extremity Assessment: Defer to OT evaluation    Lower Extremity Assessment Lower Extremity Assessment: Overall WFL for tasks assessed    Cervical / Trunk Assessment Cervical / Trunk Assessment: Normal  Communication   Communication:  No difficulties  Cognition Arousal/Alertness: Awake/alert Behavior During Therapy: WFL for tasks assessed/performed Overall Cognitive Status: Within Functional Limits for tasks assessed                                        General Comments General comments (skin integrity, edema, etc.): Given handout on LE therex he can do at home and reviewed with pt and wife, but he did not perform.    Exercises     Assessment/Plan    PT Assessment Patent does not need any further PT services  PT Problem List         PT  Treatment Interventions      PT Goals (Current goals can be found in the Care Plan section)  Acute Rehab PT Goals Patient Stated Goal: go home PT Goal Formulation: All assessment and education complete, DC therapy    Frequency     Barriers to discharge        Co-evaluation PT/OT/SLP Co-Evaluation/Treatment: Yes Reason for Co-Treatment: Complexity of the patient's impairments (multi-system involvement) PT goals addressed during session: Mobility/safety with mobility OT goals addressed during session: ADL's and self-care;Other (comment) (Energy conservation)       AM-PAC PT "6 Clicks" Mobility  Outcome Measure Help needed turning from your back to your side while in a flat bed without using bedrails?: A Little Help needed moving from lying on your back to sitting on the side of a flat bed without using bedrails?: A Little Help needed moving to and from a bed to a chair (including a wheelchair)?: None Help needed standing up from a chair using your arms (e.g., wheelchair or bedside chair)?: None Help needed to walk in hospital room?: None Help needed climbing 3-5 steps with a railing? : A Little 6 Click Score: 21    End of Session   Activity Tolerance: Patient tolerated treatment well Patient left: in bed;with call bell/phone within reach;with family/visitor present Nurse Communication: Mobility status PT Visit Diagnosis: Muscle weakness (generalized) (M62.81)    Time: [id number] PT Time Calculation (min) (ACUTE ONLY): 22 min   Charges:   PT Evaluation $PT Eval Low Complexity: 1 Low          Karen L. Tamala Julian, [place]  10/29/2020   Galen Manila 10/29/2020, 12:47 PM

## 2020-10-29 NOTE — Evaluation (Signed)
Occupational Therapy Evaluation Patient Details Name: Albert Flowers MRN: [id number] DOB: Jan 02, 1971 Today's Date: 10/29/2020    History of Present Illness 50 y.o. male who is admitted to [facility] on 10/26/2020 with Large PE and RT heart strain.  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   Clinical Impression   Patient evaluated by Occupational Therapy with no further acute OT needs identified. All education and recommendations including adaptive equipment/DME and energy conservation has been completed with handouts for reference and the patient has no further questions.  See below for any follow-up Occupational Therapy or equipment needs. OT is signing off. Thank you for this referral.     Follow Up Recommendations  No OT follow up    Equipment Recommendations  3 in 1 bedside commode;Other (comment) (Rollator)    Recommendations for Other Services       Precautions / Restrictions Precautions Precaution Comments: Mon HR, SpO2.  HR elevation to upper 120s after ambulation. SpO2 staying over 92% with all activity. Restrictions Weight Bearing Restrictions: No      Mobility Bed Mobility Overal bed mobility: Modified Independent             General bed mobility comments: HOB partialy elevated, no use of rails, slow moving but without need of cues or assistance.    Transfers Overall transfer level: Modified independent Equipment used: Rolling walker (2 wheeled)             General transfer comment: Pt requested use of RW to save energy during gait in hallway.    Balance Overall balance assessment: Mild deficits observed, not formally tested                                         ADL either performed or assessed with clinical judgement   ADL Overall ADL's : At baseline;Modified  independent                                       General ADL Comments: Pt able to perform all BADLs without assistance. Pt does have general look of fatigue and must pace himself.  Issued handouts and education on energy conservation strategies to pt and spouse.     Vision Baseline Vision/History: Wears glasses Wears Glasses: Reading only Patient Visual Report: No change from baseline Vision Assessment?: No apparent visual deficits     Perception     Praxis      Pertinent Vitals/Pain Pain Assessment: No/denies pain Faces Pain Scale: Hurts a little bit Pain Location: Pt denied pain but noted facial grimace when moving from supine to sit.     Hand Dominance Right   Extremity/Trunk Assessment Upper Extremity Assessment Upper Extremity Assessment: Overall WFL for tasks assessed   Lower Extremity Assessment Lower Extremity Assessment: Defer to PT evaluation   Cervical / Trunk Assessment Cervical / Trunk Assessment: Normal   Communication Communication Communication: No difficulties   Cognition Arousal/Alertness: Awake/alert Behavior During Therapy: WFL for tasks assessed/performed Overall Cognitive Status: Within Functional Limits for tasks assessed  General Comments       Exercises     Shoulder Instructions      Home Living Family/patient expects to be discharged to:: Private residence Living Arrangements: Spouse/significant other;Children Available Help at Discharge: Family;Available 24 hours/day Type of Home: House Home Access: Stairs to enter [employer] of Steps: 6 Entrance Stairs-Rails: Right Home Layout: Multi-level Alternate Level Stairs-Number of Steps: 8 down to pt's bed and bathroom with rail on RT, ascending.  [place] 5-6 stairs up to 3rd floor where pt's chidlren's bedrooms are with RT rail ascending. Alternate Level Stairs-Rails: Right Bathroom Shower/Tub: Arts administrator: Standard Bathroom Accessibility: No   Home Equipment: Cane - single point;Shower seat;Hand held shower head;Wheelchair - manual          Prior Functioning/Environment Level of Independence: Independent with assistive device(s)        Comments: independent ADLs, uses cane for longer walks and for stairs, denies falls in past 1 year. 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 Pt reports that his family is going on vacation soon in [place].        OT Problem List: Decreased strength;Decreased activity tolerance      OT Treatment/Interventions:      OT Goals(Current goals can be found in the care plan section) Acute Rehab OT Goals Patient Stated Goal: Resume all previous activities. OT Goal Formulation: With patient/family Potential to Achieve Goals: Good ADL Goals Additional ADL Goal #1: Patient will identify at least 3 energy conservation strategies to employ at home in order to maximize function and quality of life and decrease caregiver burden while preventing exacerbation of symptoms and rehospitalization.  OT Frequency:     Barriers to D/C:            Co-evaluation PT/OT/SLP Co-Evaluation/Treatment: Yes Reason for Co-Treatment: Complexity of the patient's impairments (multi-system involvement) PT goals addressed during session: Mobility/safety with mobility OT goals addressed during session: ADL's and self-care;Other (comment) (Energy conservation)      AM-PAC OT "6 Clicks" Daily Activity     Outcome Measure Help from another person eating meals?: None Help from another person taking care of personal grooming?: None Help from another person toileting, which includes using toliet, bedpan, or urinal?: None Help from another person bathing (including washing, rinsing, drying)?: None Help from another person to put on and taking off regular upper body clothing?: None Help from another person to put on and taking off regular  lower body clothing?: None 6 Click Score: 24   End of Session Equipment Utilized During Treatment: Rolling walker Nurse Communication: Mobility status  Activity Tolerance: Patient tolerated treatment well Patient left: in bed;with call bell/phone within reach;with family/visitor present  OT Visit Diagnosis:  (Z73.6, difficulty with ADLs)                Time: 1115-1140 OT Time Calculation (min): 25 min Charges:  OT General Charges $OT Visit: 1 Visit OT Evaluation $OT Eval Low Complexity: 1 Low  Masae Lukacs, [place] Office: [phone] 10/29/2020  Julien Girt 10/29/2020, 11:57 AM

## 2020-10-30 DIAGNOSIS — C029 Malignant neoplasm of tongue, unspecified: Secondary | ICD-10-CM

## 2020-10-30 DIAGNOSIS — D638 Anemia in other chronic diseases classified elsewhere: Secondary | ICD-10-CM

## 2020-10-30 DIAGNOSIS — R5381 Other malaise: Secondary | ICD-10-CM

## 2020-10-30 LAB — CBC
HCT: 25 % — ABNORMAL LOW (ref 39.0–52.0)
Hemoglobin: 8.2 g/dL — ABNORMAL LOW (ref 13.0–17.0)
MCH: 32.7 pg (ref 26.0–34.0)
MCHC: 32.8 g/dL (ref 30.0–36.0)
MCV: 99.6 fL (ref 80.0–100.0)
Platelets: 152 10*3/uL (ref 150–400)
RBC: 2.51 MIL/uL — ABNORMAL LOW (ref 4.22–5.81)
RDW: 17.4 % — ABNORMAL HIGH (ref 11.5–15.5)
WBC: 3.8 10*3/uL — ABNORMAL LOW (ref 4.0–10.5)
nRBC: 0 % (ref 0.0–0.2)

## 2020-10-30 LAB — HEMOGLOBIN A1C
Hgb A1c MFr Bld: 8.1 % — ABNORMAL HIGH (ref 4.8–5.6)
Mean Plasma Glucose: 185.77 mg/dL

## 2020-10-30 LAB — RENAL FUNCTION PANEL
Albumin: 3 g/dL — ABNORMAL LOW (ref 3.5–5.0)
Anion gap: 8 (ref 5–15)
BUN: 9 mg/dL (ref 6–20)
CO2: 23 mmol/L (ref 22–32)
Calcium: 8.9 mg/dL (ref 8.9–10.3)
Chloride: 102 mmol/L (ref 98–111)
Creatinine, Ser: 0.51 mg/dL — ABNORMAL LOW (ref 0.61–1.24)
GFR, Estimated: >60 mL/min (ref >60)
Glucose, Bld: 247 mg/dL — ABNORMAL HIGH (ref 70–99)
Phosphorus: 3.1 mg/dL (ref 2.5–4.6)
Potassium: 3.8 mmol/L (ref 3.5–5.1)
Sodium: 133 mmol/L — ABNORMAL LOW (ref 135–145)

## 2020-10-30 LAB — RETICULOCYTES
Immature Retic Fract: 23.4 % — ABNORMAL HIGH (ref 2.3–15.9)
RBC.: 2.47 MIL/uL — ABNORMAL LOW (ref 4.22–5.81)
Retic Count, Absolute: 56.8 10*3/uL (ref 19.0–186.0)
Retic Ct Pct: 2.3 % (ref 0.4–3.1)

## 2020-10-30 LAB — GLUCOSE, CAPILLARY: Glucose-Capillary: 125 mg/dL — ABNORMAL HIGH (ref 70–99)

## 2020-10-30 LAB — FOLATE: Folate: 17.6 ng/mL (ref >5.9)

## 2020-10-30 LAB — FERRITIN: Ferritin: 1041 ng/mL — ABNORMAL HIGH (ref 24–336)

## 2020-10-30 LAB — IRON AND TIBC
Iron: 73 ug/dL (ref 45–182)
Saturation Ratios: 34 % (ref 17.9–39.5)
TIBC: 217 ug/dL — ABNORMAL LOW (ref 250–450)
UIBC: 144 ug/dL

## 2020-10-30 LAB — MAGNESIUM: Magnesium: 1.9 mg/dL (ref 1.7–2.4)

## 2020-10-30 LAB — VITAMIN B12: Vitamin B-12: 361 pg/mL (ref 180–914)

## 2020-10-30 MED ORDER — HEPARIN SOD (PORK) LOCK FLUSH 100 UNIT/ML IV SOLN
500.0000 [IU] | INTRAVENOUS | Status: AC | PRN
  Administered 2020-10-30: 500 [IU]
  Filled 2020-10-30: qty 5

## 2020-10-30 MED ORDER — SENNA 8.6 MG PO TABS: 1.0000 | ORAL_TABLET | Freq: Two times a day (BID) | ORAL | 0 refills | Status: AC

## 2020-10-30 MED ORDER — ENOXAPARIN SODIUM 60 MG/0.6ML IJ SOSY: 60.0000 mg | PREFILLED_SYRINGE | Freq: Two times a day (BID) | INTRAMUSCULAR | 1 refills | Status: DC

## 2020-10-30 NOTE — Progress Notes (Signed)
Patient discharged home; accompanied by wife.  Transported out via wheelchair; escorted per myself.

## 2020-10-30 NOTE — Discharge Summary (Signed)
Physician Discharge Summary  Albert Flowers IPJ:[id number] DOB: 1970/12/01 DOA: 10/26/2020  PCP: Benay Pike, MD  Admit date: 10/26/2020 Discharge date: 10/30/2020  Admitted From: Home Disposition: Home  Recommendations for Outpatient Follow-up:  Follow ups as below. Please obtain CBC/BMP/Mag at follow up Please follow up on the following pending results: None  Home Health: Not indicated Equipment/Devices: Rollator and 3 in 1 commode  Discharge Condition: Stable CODE STATUS: Full code   Follow-up Information     [name], [name], MD. Schedule an appointment as soon as possible for a visit in 1 week(s).   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  Patient was started on IV heparin.  Pulmonology and IR consulted and did not feel thrombectomy/thrombolysis was indicated.  Hematology recommended discharge on subcu Lovenox with a plan to transition to [facility] outpatient.   On the day of discharge, patient felt better.  Oxygen saturation 97% with ambulation on room air.  He is discharged on Lovenox injection.  Has upcoming appointment with hematology/oncology.  See individual problem list below for more on hospital course.  Discharge Diagnoses:  Acute pulmonary embolism with cor pulmonale: Both malignancy and Megace could increase risk of VTE.  Was on IV heparin.  Transitioned to subcu Lovenox.   Improved. -PCCM and IR did not feel thrombolysis is indicated -Hematology recommended discharge on Lovenox with a plan to transition to [facility] outpatient.   Acute hypoxic respiratory failure: Due to the above.  Resolved. 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 Delories Heinz  -Outpatient follow-up.   RUL infiltrate-patient has no new respiratory symptoms, fever or leukocytosis.  Likely residual from his recent HCAP last month versus active infection.   Possible lung cance: persistent lung nodule undergoing work-up with planning for possible bronchoscopy likely outpatient.   Uncontrolled NIDDM-2 with hyperglycemia: A1c 8.5% on 7/20. -Discharged on home medications.  Anemia of chronic disease: H&H relatively stable. Recent Labs    10/05/20 0947 10/06/20 0417 10/13/20 1408 10/20/20 1147 10/26/20 1439 10/27/20 0538 10/27/20 2201 10/28/20 0350 10/29/20 0219 10/30/20 0345  HGB 7.6* 8.0* 8.4* 8.5* 11.0* 9.0* 8.9* 8.9* 8.7* 8.2*     Severe protein calorie malnutrition -Continue home Remeron.  May try 45 mg nightly if he tolerates. -Discontinued Megace.  Patient did not appreciate significant benefit.  Only risk of VTE -Multivitamin -May consider feeding supplements  Body mass index is 21.24 kg/m.            Discharge Exam: Vitals:   10/30/20 0300 10/30/20 1347  BP:  93/74  Pulse:  95  Resp:  (!) 30  Temp: 98 F (36.7 C) 98.3 F (36.8 C)  SpO2:      GENERAL: No apparent distress.  Nontoxic. HEENT: MMM.  Vision and hearing grossly intact.  NECK: Supple.  No apparent JVD.  RESP: 98% on RA.  No IWOB.  Fair aeration bilaterally. CVS:  RRR. Heart sounds normal.  ABD/GI/GU: Bowel sounds present. Soft. Non tender.  MSK/EXT:  Moves extremities. No apparent deformity. No edema.  SKIN: no apparent skin lesion or wound NEURO: Awake, alert and oriented appropriately.  No apparent focal neuro deficit. PSYCH: Calm. Normal affect.    Discharge Instructions  Discharge Instructions     Call MD for:  difficulty breathing, headache or visual disturbances   Complete by: As directed    Call MD for:  extreme fatigue   Complete by: As directed    Call MD for:  persistant dizziness or light-headedness   Complete by: As directed    Call MD for:  severe uncontrolled pain   Complete by: As directed    Diet general   Complete by: As directed    Discharge instructions   Complete by: As directed    It has been a pleasure taking care of you!  You were hospitalized due to a blood clot in your left lung.  We have started you on Lovenox injection for treatment.  We are discharging you on this medication until you follow-up with your hematologist/oncologist.  Please avoid any over-the-counter medications other than plain Tylenol while on Lovenox.   Take care,   Increase activity slowly   Complete by: As directed       Allergies as of 10/30/2020       Reactions   Penicillins Other (See Comments)   Unknown reaction- from childhood?        Medication List     STOP taking these medications    megestrol 400 MG/10ML suspension Commonly known as: MEGACE       TAKE these medications    albuterol 108 (90 Base) MCG/ACT inhaler Commonly known as: VENTOLIN HFA Inhale 2 puffs into the lungs every 6 (six) hours as needed for wheezing or shortness of breath.   blood glucose meter kit and supplies Kit Dispense based on patient and insurance preference. Use up to four times daily as directed.   diphenhydrAMINE 25 MG tablet Commonly known as: BENADRYL Take 1 tablet (25 mg total) by mouth at bedtime.   enoxaparin 60 MG/0.6ML injection Commonly known as: LOVENOX Inject 0.6 mLs (60 mg total) into the skin every 12 (twelve) hours.   gabapentin 300 MG capsule Commonly known as: NEURONTIN Take 900 mg by mouth 2 (two) times daily.   glipiZIDE 5 MG tablet Commonly known as: GLUCOTROL Take 0.5 tablets (2.5 mg total) by  mouth daily before breakfast.   Insulin Pen Needle 29G X 5MM Misc For sliding scale insulin injection   lactose free nutrition Liqd Take 237 mLs by mouth 3 (three) times daily between meals.   lidocaine-prilocaine cream Commonly known as: EMLA Apply to affected area once What changed:  how much to take how to take this when to take this additional instructions   magic mouthwash Soln Take 15 mLs by mouth 3 (three) times daily.   metFORMIN 500 MG tablet Commonly known as: GLUCOPHAGE Take 1 tablet (500 mg total) by mouth 2 (two) times daily with a meal.   mirtazapine 15 MG tablet Commonly known as: REMERON Take 2 tablets (30 mg total) by mouth at bedtime.   oxyCODONE 5 MG immediate release tablet Commonly known as: Oxy IR/ROXICODONE Take 5 mg by mouth at bedtime.   polyethylene glycol 17 g packet Commonly known as: MIRALAX / GLYCOLAX Take 17 g by mouth daily.   prochlorperazine 10 MG tablet  Commonly known as: COMPAZINE Take 10 mg by mouth every 6 (six) hours as needed for nausea or vomiting.   senna 8.6 MG Tabs tablet Commonly known as: SENOKOT Take 1 tablet (8.6 mg total) by mouth 2 (two) times daily.       ASK your doctor about these medications    lidocaine 5 % Commonly known as: [place] 1 patch onto the skin daily. Remove & Discard patch within 12 hours or as directed by MD   ondansetron 8 MG disintegrating tablet Commonly known as: Zofran ODT Take 1 tablet (8 mg total) by mouth every 8 (eight) hours as needed for nausea or vomiting.               Durable Medical Equipment  (From admission, onward)           Start     Ordered   10/30/20 1045  For home use only DME Bedside commode  Once       Question:  Patient needs a bedside commode to treat with the following condition  Answer:  Generalized weakness   10/30/20 1044   10/30/20 1045  For home use only DME 4 wheeled rolling walker with seat  Once       Comments: Rollator  Question:   Patient needs a walker to treat with the following condition  Answer:  Generalized weakness   10/30/20 1044            Consultations: Pulmonology Interventional radiology Hematology/oncology  Procedures/Studies:   DG Chest 2 View  Result Date: 10/26/2020 CLINICAL DATA:  Shortness of breath. EXAM: CHEST - 2 VIEW COMPARISON:  10/02/2020 FINDINGS: Redemonstrated right chest port with catheter tip approximating the cavoatrial junction. Unchanged cardiac and mediastinal contours. Previously noted right medial basilar consolidative opacity is improved. No new focal pulmonary opacity. No pneumothorax. Possible trace pleural effusion. IMPRESSION: No acute cardiopulmonary process. Electronically Signed   By: Merilyn Baba MD   On: 10/26/2020 14:13   DG Chest 2 View  Result Date: 10/02/2020 CLINICAL DATA:  Chest pain EXAM: CHEST - 2 VIEW COMPARISON:  09/27/2020 FINDINGS: The lungs are symmetrically well expanded. Small bilateral pleural effusions are present, new since prior examination. Medial right basilar consolidation is unchanged. No pneumothorax. Right internal jugular chest port is seen with its tip at the superior cavoatrial junction. Cardiac size within normal limits. Pulmonary vascularity is normal. No acute bone abnormality. IMPRESSION: Interval development of small bilateral pleural effusions. Stable right medial basilar consolidation. Electronically Signed   By: Fidela Salisbury MD   On: 10/02/2020 21:00   CT HEAD W & WO CONTRAST (5MM)  Result Date: 10/27/2020 CLINICAL DATA:  Initial evaluation for possible metastatic disease, history of squamous cell carcinoma at base of tongue. EXAM: CT HEAD WITHOUT AND WITH CONTRAST TECHNIQUE: Contiguous axial images were obtained from the base of the skull through the vertex without and with intravenous contrast CONTRAST:  47m OMNIPAQUE IOHEXOL 350 MG/ML SOLN COMPARISON:  None available. FINDINGS: Brain: Cerebral volume within normal limits for  patient age. No evidence for acute intracranial hemorrhage. No findings to suggest acute large vessel territory infarct. No mass lesion, midline shift, or mass effect. Ventricles are normal in size without evidence for hydrocephalus. No extra-axial fluid collection identified. Vascular: No hyperdense vessel seen prior to contrast administration. Normal intravascular enhancement seen throughout the major arterial and venous structures following contrast administration. Skull: Scalp soft tissues demonstrate no acute abnormality. Calvarium intact. Sinuses/Orbits: Globes and orbital soft tissues within  normal limits. Visualized paranasal sinuses are clear. No mastoid effusion. IMPRESSION: Normal head CT. No evidence for intracranial hemorrhage or metastatic disease. Electronically Signed   By: Jeannine Boga M.D.   On: 10/27/2020 00:41   CT Angio Chest PE W and/or Wo Contrast  Result Date: 10/26/2020 CLINICAL DATA:  Metastatic head neck cancer. Recurrent pneumonia. Cough. Shortness of breath. EXAM: CT ANGIOGRAPHY CHEST WITH CONTRAST TECHNIQUE: Multidetector CT imaging of the chest was performed using the standard protocol during bolus administration of intravenous contrast. Multiplanar CT image reconstructions and MIPs were obtained to evaluate the vascular anatomy. CONTRAST:  52m OMNIPAQUE IOHEXOL 350 MG/ML SOLN COMPARISON:  Plain film 10/26/2020.  CT 10/03/2020 FINDINGS: Cardiovascular: The quality of this exam for evaluation of pulmonary embolism is sufficient. There is mild motion degradation throughout. However, a large left-sided pulmonary embolism is seen within the main pulmonary artery and lobar branches. Right heart strain, as evidenced by an RV to LV ratio of 4.6/3.9= 1.2. Aortic atherosclerosis. Normal heart size, without pericardial effusion. Multivessel coronary artery atherosclerosis. Right Port-A-Cath tip mid right atrium. Mediastinum/Nodes: No mediastinal or hilar adenopathy. Lungs/Pleura:  Tiny right pleural effusion, minimally decreased. The left-sided pleural effusion has resolved. Slight improvement in central right lower lobe airspace disease. Mild superior segment right lower lobe airspace disease laterally including on 62/10, new. Multifocal patchy right upper lobe airspace disease is slightly increased. Right upper lobe 5 mm nodule on 38/10 is increased from 2 mm on the prior. A right middle lobe 1.5 cm nodule on 79/10 has minimally enlarged from 1.4 cm on the prior. Somewhat vague pleural-based right lower lobe 7 mm nodule on 54/10 is similar to minimally enlarged from 6 mm on the prior. Upper Abdomen: Motion degradation continuing into the upper abdomen. Grossly normal imaged liver, spleen, stomach, pancreas, gallbladder, adrenal glands, kidneys. Musculoskeletal: Multifocal osseous metastasis again identified, primarily sclerotic in appearance. Anterior left fourth rib osseous and soft tissue density lesion including on 81/4 is relatively similar. An expansile lesion involving the sixth anterior right rib including on 104/4 is felt to be slightly increased. Compression deformity at T8 is moderate and similar. Review of the MIP images confirms the above findings. IMPRESSION: 1. Mildly motion degraded exam. 2. Large volume left-sided pulmonary embolism with evidence of right heart strain. (RV/LV Ratio = 1.2) consistent with at least submassive (intermediate risk) PE. The presence of right heart strain has been associated with an increased risk of morbidity and mortality. Please refer to the "PE Focused" order set in EPIC. 3. Shifting right-sided pulmonary opacities, most consistent with infection or aspiration. 4. Pulmonary nodules, felt to be slightly progressive and indicative of metastatic disease. 5. Widespread osseous metastasis, mildly progressive as above. 6. Coronary artery atherosclerosis. Aortic Atherosclerosis (ICD10-I70.0). Case was discussed with Dr TNada Libmanat 4:25 pm.  Electronically Signed   By: KAbigail MiyamotoM.D.   On: 10/26/2020 16:59   CT Angio Chest PE W and/or Wo Contrast  Result Date: 10/03/2020 CLINICAL DATA:  Left chest pain EXAM: CT ANGIOGRAPHY CHEST WITH CONTRAST TECHNIQUE: Multidetector CT imaging of the chest was performed using the standard protocol during bolus administration of intravenous contrast. Multiplanar CT image reconstructions and MIPs were obtained to evaluate the vascular anatomy. CONTRAST:  521mOMNIPAQUE IOHEXOL 350 MG/ML SOLN COMPARISON:  Chest radiographs dated 10/02/2020. CTA chest dated 09/17/2020. FINDINGS: Cardiovascular: Satisfactory opacification of the bilateral pulmonary arteries to the lobar level. No evidence of pulmonary embolism. Although not tailored for evaluation of the thoracic aorta, there is no  evidence of thoracic aortic aneurysm or dissection. Mild atherosclerotic calcifications of the aortic arch. Heart is normal in size.  No pericardial effusion. Mild coronary atherosclerosis of the LAD. Mediastinum/Nodes: No suspicious mediastinal lymphadenopathy. Visualized thyroid is unremarkable. Lungs/Pleura: Scarring/radiation changes at the lung apices. Patchy opacity in the medial right lower lobe (series 5/image 93), less rounded/masslike on the current study, continuing to favor infection/pneumonia. Associated small bilateral pleural effusions, unchanged on the right and new on the left. Associated mild compressive atelectasis in the left lower lobe. 14 mm right middle lobe nodule (series 6/image 82), previously 12 mm. 6 mm posterior right lower lobe nodule (series 6/image 54), previously 4 mm. These are suspicious for metastatic disease when correlating with prior PET. Mild patchy/ground-glass opacities in the right upper lobe (series 6/image 68), suspicious for additional infection/pneumonia, new. Mild vague ground-glass opacity in the left lower lobe (series 6/image 88), equivocal. Debris in the trachea (series 6/image 31),  likely reflecting secretions. No pneumothorax. Upper Abdomen: Visualized upper abdomen is grossly unremarkable. Musculoskeletal: Multifocal osseous metastases involving the left sternum, multiple ribs, multiple thoracic vertebral bodies, and the right proximal humerus, grossly unchanged. However, the soft tissue component along the left anterior 4th rib is more conspicuous on the current study, suggesting mild progression (series 5/image 84). Review of the MIP images confirms the above findings. IMPRESSION: No evidence of pulmonary embolism. Patchy right lower lobe opacity remains suspicious for pneumonia. Additional mild right upper lobe infection/pneumonia. Small bilateral pleural effusions, progressive on the left. Two right lung nodules are mildly progressive from recent CT, suspicious for metastatic disease. Mildly progressive soft tissue component associated with left anterior 4th rib metastasis. Additional multifocal osseous metastases are unchanged. Aortic Atherosclerosis (ICD10-I70.0). Electronically Signed   By: Julian Hy M.D.   On: 10/03/2020 03:04   DG CHEST PORT 1 VIEW  Result Date: 10/28/2020 CLINICAL DATA:  Chest pain EXAM: PORTABLE CHEST 1 VIEW COMPARISON:  10/26/2020 FINDINGS: Right-sided chest port terminates at the level of the superior cavoatrial junction. Normal heart size. Increasing opacity within the inferior aspect of the right upper lobe. Nodular opacities persist within the right lung. Small right pleural effusion. No pneumothorax. Osseous metastatic disease including proximal right humerus and anterior left fourth rib, better seen on previous day CT. IMPRESSION: 1. Increasing opacity within the inferior aspect of the right upper lobe suspicious for pneumonia. 2. Small right pleural effusion. Electronically Signed   By: Davina Poke D.O.   On: 10/28/2020 14:25   ECHOCARDIOGRAM COMPLETE  Result Date: 10/27/2020    ECHOCARDIOGRAM REPORT   Patient Name:   CAMERON SCHWINN  Date of Exam: 10/27/2020 Medical Rec #:  [id number]      Height:       70.0 in Accession #:    [id number]     Weight:       148.0 lb Date of Birth:  Mar 29, 1970      BSA:          1.837 m Patient Age:    50 years       BP:           110/88 mmHg Patient Gender: M              HR:           104 bpm. Exam Location:  Inpatient Procedure: 2D Echo, Cardiac Doppler and Color Doppler                   STAT ECHO Reported to: Dr  Hilty on 10/27/2020 8:14:00 AM. Indications:    Pulmonary embolus  History:        Patient has no prior history of Echocardiogram examinations.                 Risk Factors:Diabetes.  Sonographer:    [place] Referring Phys: [id number] [place]  1. Left ventricular ejection fraction, by estimation, is 55 to 60%. The left ventricle has normal function. The left ventricle has no regional wall motion abnormalities. Left ventricular diastolic parameters are consistent with Grade I diastolic dysfunction (impaired relaxation).  2. Right ventricular systolic function is normal. The right ventricular size is normal. There is moderately elevated pulmonary artery systolic pressure.  3. The mitral valve is grossly normal. Trivial mitral valve regurgitation.  4. The aortic valve is tricuspid. Aortic valve regurgitation is not visualized. Comparison(s): No prior Echocardiogram. No evidence of RV strain. Conclusion(s)/Recommendation(s): Critical findings reported to Dr. Ander Slade and acknowledged at 10/27/2020, 10:00 am. FINDINGS  Left Ventricle: Left ventricular ejection fraction, by estimation, is 55 to 60%. The left ventricle has normal function. The left ventricle has no regional wall motion abnormalities. The left ventricular internal cavity size was normal in size. There is  no left ventricular hypertrophy. Left ventricular diastolic parameters are consistent with Grade I diastolic dysfunction (impaired relaxation). Indeterminate filling pressures. Right Ventricle: The right ventricular  size is normal. No increase in right ventricular wall thickness. Right ventricular systolic function is normal. There is moderately elevated pulmonary artery systolic pressure. The tricuspid regurgitant velocity is 3.11 m/s, and with an assumed right atrial pressure of 8 mmHg, the estimated right ventricular systolic pressure is 13.2 mmHg. Left Atrium: Left atrial size was normal in size. Right Atrium: Right atrial size was normal in size. Pericardium: There is no evidence of pericardial effusion. Mitral Valve: The mitral valve is grossly normal. Trivial mitral valve regurgitation. Tricuspid Valve: The tricuspid valve is grossly normal. Tricuspid valve regurgitation is mild. Aortic Valve: The aortic valve is tricuspid. Aortic valve regurgitation is not visualized. Aortic valve mean gradient measures 2.0 mmHg. Aortic valve peak gradient measures 4.5 mmHg. Aortic valve area, by VTI measures 3.16 cm. Pulmonic Valve: The pulmonic valve was normal in structure. Pulmonic valve regurgitation is not visualized. Aorta: The aortic root and ascending aorta are structurally normal, with no evidence of dilitation. IAS/Shunts: No atrial level shunt detected by color flow Doppler.  LEFT VENTRICLE PLAX 2D LVIDd:         4.10 cm     Diastology LVIDs:         2.90 cm     LV e' medial:    8.16 cm/s LV PW:         0.90 cm     LV E/e' medial:  6.4 LV IVS:        0.80 cm     LV e' lateral:   6.53 cm/s LVOT diam:     2.10 cm     LV E/e' lateral: 8.0 LV SV:         47 LV SV Index:   26 LVOT Area:     3.46 cm  LV Volumes (MOD) LV vol d, MOD A2C: 26.4 ml LV vol d, MOD A4C: 42.4 ml LV vol s, MOD A2C: 11.7 ml LV vol s, MOD A4C: 22.1 ml LV SV MOD A2C:     14.7 ml LV SV MOD A4C:     42.4 ml LV SV MOD BP:  18.5 ml RIGHT VENTRICLE RV Basal diam:  3.60 cm RV Mid diam:    2.80 cm RV S prime:     14.90 cm/s TAPSE (M-mode): 1.3 cm LEFT ATRIUM             Index       RIGHT ATRIUM           Index LA Vol (A2C):   12.2 ml 6.64 ml/m  RA Area:      10.60 cm LA Vol (A4C):   20.4 ml 11.11 ml/m RA Volume:   24.50 ml  13.34 ml/m LA Biplane Vol: 16.7 ml 9.09 ml/m  AORTIC VALVE AV Area (Vmax):    3.05 cm AV Area (Vmean):   2.90 cm AV Area (VTI):     3.16 cm AV Vmax:           106.00 cm/s AV Vmean:          72.800 cm/s AV VTI:            0.150 m AV Peak Grad:      4.5 mmHg AV Mean Grad:      2.0 mmHg LVOT Vmax:         93.30 cm/s LVOT Vmean:        61.000 cm/s LVOT VTI:          0.137 m LVOT/AV VTI ratio: 0.91  AORTA Ao Root diam: 3.30 cm MITRAL VALVE               TRICUSPID VALVE MV Area (PHT): 4.41 cm    TR Peak grad:   38.7 mmHg MV Decel Time: 172 msec    TR Vmax:        311.00 cm/s MV E velocity: 52.00 cm/s MV A velocity: 83.70 cm/s  SHUNTS MV E/A ratio:  0.62        Systemic VTI:  0.14 m                            Systemic Diam: 2.10 cm Lyman Bishop MD Electronically signed by Lyman Bishop MD Signature Date/Time: 10/27/2020/10:03:45 AM    Final        The results of significant diagnostics from this hospitalization (including imaging, microbiology, ancillary and laboratory) are listed below for reference.     Microbiology: Recent Results (from the past 240 hour(s))  Resp Panel by RT-PCR (Flu A&B, Covid) Nasopharyngeal Swab     Status: None   Collection Time: 10/26/20  3:57 PM   Specimen: Nasopharyngeal Swab; Nasopharyngeal(NP) swabs in vial transport medium  Result Value Ref Range Status   SARS Coronavirus 2 by RT PCR NEGATIVE NEGATIVE Final    Comment: (NOTE) SARS-CoV-2 target nucleic acids are NOT DETECTED.  The SARS-CoV-2 RNA is generally detectable in upper respiratory specimens during the acute phase of infection. The lowest concentration of SARS-CoV-2 viral copies this assay can detect is 138 copies/mL. A negative result does not preclude SARS-Cov-2 infection and should not be used as the sole basis for treatment or other patient management decisions. A negative result may occur with  improper specimen collection/handling,  submission of specimen other than nasopharyngeal swab, presence of viral mutation(s) within the areas targeted by this assay, and inadequate number of viral copies(<138 copies/mL). A negative result must be combined with clinical observations, patient history, and epidemiological information. The expected result is Negative.  Fact Sheet for Patients:  EntrepreneurPulse.com.au  Fact Sheet for Healthcare Providers:  IncredibleEmployment.be  This test is no t yet approved or cleared by the [place] and  has been authorized for detection and/or diagnosis of SARS-CoV-2 by FDA under an Emergency Use Authorization (EUA). This EUA will remain  in effect (meaning this test can be used) for the duration of the COVID-19 declaration under Section 564(b)(1) of the Act, 21 U.S.C.section 360bbb-3(b)(1), unless the authorization is terminated  or revoked sooner.       Influenza A by PCR NEGATIVE NEGATIVE Final   Influenza B by PCR NEGATIVE NEGATIVE Final    Comment: (NOTE) The Xpert Xpress SARS-CoV-2/FLU/RSV plus assay is intended as an aid in the diagnosis of influenza from Nasopharyngeal swab specimens and should not be used as a sole basis for treatment. Nasal washings and aspirates are unacceptable for Xpert Xpress SARS-CoV-2/FLU/RSV testing.  Fact Sheet for Patients: EntrepreneurPulse.com.au  Fact Sheet for Healthcare Providers: IncredibleEmployment.be  This test is not yet approved or cleared by the [place] FDA and has been authorized for detection and/or diagnosis of SARS-CoV-2 by FDA under an Emergency Use Authorization (EUA). This EUA will remain in effect (meaning this test can be used) for the duration of the COVID-19 declaration under Section 564(b)(1) of the Act, 21 U.S.C. section 360bbb-3(b)(1), unless the authorization is terminated or revoked.  Performed at [facility], [place] [address], [place], [place] [address]      Labs:  CBC: Recent Labs  Lab 10/26/20 1439 10/27/20 0538 10/27/20 2201 10/28/20 0350 10/29/20 0219 10/30/20 0345  WBC 9.2 8.8 7.7 7.8 6.6 3.8*  NEUTROABS 7.5  --   --   --   --   --   HGB 11.0* 9.0* 8.9* 8.9* 8.7* 8.2*  HCT 34.1* 27.4* 26.8* 26.6* 26.0* 25.0*  MCV 102.1* 100.0 99.3 98.9 98.9 99.6  PLT 208 166 148* 140* 147* 152   BMP &GFR Recent Labs  Lab 10/26/20 1439 10/27/20 0538 10/28/20 0350 10/30/20 0345  NA 136 137 132* 133*  K 3.5 4.1 4.1 3.8  CL 97* 99 100 102  CO2 _0 GLUCOSE 211* 191* 146* 247*  BUN _1 CREATININE 0.59* 0.44* 0.50* 0.51*  CALCIUM 9.3 9.0 8.8* 8.9  MG  --   --   --  1.9  PHOS  --   --   --  3.1   Estimated Creatinine Clearance: 103.7 mL/min (A) (by C-G formula based on SCr of 0.51 mg/dL (L)). Liver & Pancreas: Recent Labs  Lab 10/27/20 0538 10/28/20 0350 10/30/20 0345  AST 16 13*  --   ALT 12 13  --   ALKPHOS 97 97  --   BILITOT 0.4 0.8  --   PROT 6.5 6.7  --   ALBUMIN 3.1* 2.9* 3.0*   No results for input(s): LIPASE, AMYLASE in the last 168 hours. No results for input(s): AMMONIA in the last 168 hours. Diabetic: Recent Labs    10/30/20 0345  HGBA1C 8.1*   Recent Labs  Lab 10/29/20 0758 10/29/20 1213 10/29/20 1637 10/29/20 2114 10/30/20 0723  GLUCAP 139* 258* 202* 171* 125*   Cardiac Enzymes: No results for input(s): CKTOTAL, CKMB, CKMBINDEX, TROPONINI in the last 168 hours. No results for input(s): PROBNP in the last 8760 hours. Coagulation Profile: No results for input(s): INR, PROTIME in the last 168 hours. Thyroid Function Tests: No results for input(s): TSH, T4TOTAL, FREET4, T3FREE, THYROIDAB in the last 72 hours. Lipid Profile: No results for input(s): CHOL,  HDL, LDLCALC, TRIG, CHOLHDL, LDLDIRECT in the last 72 hours. Anemia Panel: Recent Labs    10/30/20 0345  VITAMINB12 361  FOLATE 17.6  FERRITIN 1,041*  TIBC 217*  IRON  73  RETICCTPCT 2.3   Urine analysis:    Component Value Date/Time   COLORURINE STRAW (A) 10/26/2020 1811   APPEARANCEUR CLEAR 10/26/2020 1811   LABSPEC 1.017 10/26/2020 1811   PHURINE 7.0 10/26/2020 1811   GLUCOSEU NEGATIVE 10/26/2020 1811   HGBUR NEGATIVE 10/26/2020 1811   BILIRUBINUR NEGATIVE 10/26/2020 1811   KETONESUR NEGATIVE 10/26/2020 1811   PROTEINUR NEGATIVE 10/26/2020 1811   NITRITE NEGATIVE 10/26/2020 1811   LEUKOCYTESUR NEGATIVE 10/26/2020 1811   Sepsis Labs: Invalid input(s): PROCALCITONIN, LACTICIDVEN   Time coordinating discharge: 40 minutes  SIGNED:  Mercy Riding, MD  [facility] 10/30/2020, 3:51 PM  If 7PM-7AM, please contact night-coverage [email or web address]

## 2020-10-30 NOTE — TOC Initial Note (Signed)
Transition of Care [facility]) - Progression Note    Patient Details  Name: Albert Flowers MRN: [id number] Date of Birth: [birth year]  Transition of Care [facility]) CM/SW [place], [place] Phone Number: [phone] 10/30/2020, 1:28 PM  Clinical Narrative:     Patient will discharge home with 3 in 1, rolling walker.  CSW contacted [place] with [facility].  CSW confirmed with patient's Albara, [place]or[place] (Spouse) [phone] [facility] Phone) updated.  Jasmine from [place] will contact Ms. Rittenhouse with confirmation on drop ship.  Attending and Unit RN Updated  Expected Discharge Plan: Home/Self Care Barriers to Discharge: No Barriers Identified  Expected Discharge Plan and Services Expected Discharge Plan: Home/Self Care In-house Referral: Clinical Social Work   Post Acute Care Choice: Durable Medical Equipment ([place]) Living arrangements for the past 2 months: Single Family Home Expected Discharge Date: 10/30/20               DME Arranged: Berta Minor rolling DME Agency: [employer] Date DME Agency Contacted: 10/30/20 Time DME Agency Contacted: 8 Representative spoke with at DME Agency: [place] ([place]) Interventions    Readmission Risk Interventions Readmission Risk Prevention Plan 10/07/2020  Transportation Screening Complete  PCP or Specialist Appt within 3-5 Days Complete  HRI or [place] Complete  Social Work Consult for [place] Planning/Counseling Complete  Palliative Care Screening Complete  Medication Review Press photographer) Referral to Pharmacy  Some recent data might be hidden

## 2020-10-30 NOTE — Progress Notes (Signed)
SATURATION QUALIFICATIONS: (This note is used to comply with regulatory documentation for home oxygen)  Patient Saturations on Room Air at Rest = 100%  Patient Saturations on Room Air while Ambulating = 97%  Patient Saturations on 0 Liters of oxygen while Ambulating = 97%  Please briefly explain why patient needs home oxygen: N/A

## 2020-10-30 NOTE — Plan of Care (Signed)
  Problem: Clinical Measurements: Goal: Ability to maintain clinical measurements within normal limits will improve Outcome: Adequate for Discharge Goal: Will remain free from infection Outcome: Adequate for Discharge Goal: Diagnostic test results will improve Outcome: Adequate for Discharge Goal: Respiratory complications will improve Outcome: Adequate for Discharge Goal: Cardiovascular complication will be avoided Outcome: Adequate for Discharge   Problem: Activity: Goal: Risk for activity intolerance will decrease Outcome: Adequate for Discharge   Problem: Nutrition: Goal: Adequate nutrition will be maintained Outcome: Adequate for Discharge   Problem: Coping: Goal: Level of anxiety will decrease Outcome: Adequate for Discharge   Problem: Elimination: Goal: Will not experience complications related to bowel motility Outcome: Adequate for Discharge Goal: Will not experience complications related to urinary retention Outcome: Adequate for Discharge   Problem: Pain Managment: Goal: General experience of comfort will improve Outcome: Adequate for Discharge   Problem: Safety: Goal: Ability to remain free from injury will improve Outcome: Adequate for Discharge   Problem: Skin Integrity: Goal: Risk for impaired skin integrity will decrease Outcome: Adequate for Discharge   

## 2020-11-02 ENCOUNTER — Other Ambulatory Visit: Payer: Managed Care, Other (non HMO)

## 2020-11-07 ENCOUNTER — Encounter: Payer: Self-pay | Admitting: Hematology and Oncology

## 2020-11-07 ENCOUNTER — Other Ambulatory Visit: Payer: Self-pay | Admitting: Hematology and Oncology

## 2020-11-07 MED ORDER — OXYCODONE HCL 5 MG PO TABS: 5.0000 mg | ORAL_TABLET | Freq: Every day | ORAL | 0 refills | Status: DC

## 2020-11-08 ENCOUNTER — Telehealth: Payer: Self-pay | Admitting: Emergency Medicine

## 2020-11-08 NOTE — Telephone Encounter (Signed)
Spoke with the pt and with his spouse per his verbal permission   Pt had CT Angio 10/26/20  He has appt with RB 11/10/20  He asks if needs to push out the RB appt to get Super D CT since this is what RB originally wanted   Please advise thanks!

## 2020-11-09 ENCOUNTER — Inpatient Hospital Stay: Payer: Managed Care, Other (non HMO)

## 2020-11-09 ENCOUNTER — Encounter: Payer: Self-pay | Admitting: Hematology and Oncology

## 2020-11-09 ENCOUNTER — Other Ambulatory Visit: Payer: Self-pay

## 2020-11-09 ENCOUNTER — Other Ambulatory Visit: Payer: Self-pay | Admitting: Hematology and Oncology

## 2020-11-09 ENCOUNTER — Inpatient Hospital Stay (HOSPITAL_BASED_OUTPATIENT_CLINIC_OR_DEPARTMENT_OTHER): Payer: Managed Care, Other (non HMO) | Admitting: Hematology and Oncology

## 2020-11-09 DIAGNOSIS — Z95828 Presence of other vascular implants and grafts: Secondary | ICD-10-CM | POA: Insufficient documentation

## 2020-11-09 DIAGNOSIS — Z5112 Encounter for antineoplastic immunotherapy: Secondary | ICD-10-CM | POA: Diagnosis not present

## 2020-11-09 DIAGNOSIS — C090 Malignant neoplasm of tonsillar fossa: Secondary | ICD-10-CM

## 2020-11-09 DIAGNOSIS — I2609 Other pulmonary embolism with acute cor pulmonale: Secondary | ICD-10-CM | POA: Diagnosis not present

## 2020-11-09 DIAGNOSIS — E119 Type 2 diabetes mellitus without complications: Secondary | ICD-10-CM | POA: Diagnosis not present

## 2020-11-09 DIAGNOSIS — C7951 Secondary malignant neoplasm of bone: Secondary | ICD-10-CM

## 2020-11-09 DIAGNOSIS — C01 Malignant neoplasm of base of tongue: Secondary | ICD-10-CM

## 2020-11-09 DIAGNOSIS — D6481 Anemia due to antineoplastic chemotherapy: Secondary | ICD-10-CM | POA: Diagnosis not present

## 2020-11-09 DIAGNOSIS — T451X5A Adverse effect of antineoplastic and immunosuppressive drugs, initial encounter: Secondary | ICD-10-CM | POA: Diagnosis not present

## 2020-11-09 DIAGNOSIS — Z7901 Long term (current) use of anticoagulants: Secondary | ICD-10-CM | POA: Diagnosis not present

## 2020-11-09 DIAGNOSIS — R634 Abnormal weight loss: Secondary | ICD-10-CM | POA: Diagnosis not present

## 2020-11-09 DIAGNOSIS — J90 Pleural effusion, not elsewhere classified: Secondary | ICD-10-CM | POA: Diagnosis not present

## 2020-11-09 DIAGNOSIS — I2510 Atherosclerotic heart disease of native coronary artery without angina pectoris: Secondary | ICD-10-CM | POA: Diagnosis not present

## 2020-11-09 DIAGNOSIS — I2699 Other pulmonary embolism without acute cor pulmonale: Secondary | ICD-10-CM | POA: Diagnosis not present

## 2020-11-09 DIAGNOSIS — Z794 Long term (current) use of insulin: Secondary | ICD-10-CM | POA: Diagnosis not present

## 2020-11-09 LAB — CBC WITH DIFFERENTIAL/PLATELET
Abs Immature Granulocytes: 0.04 10*3/uL (ref 0.00–0.07)
Basophils Absolute: 0 10*3/uL (ref 0.0–0.1)
Basophils Relative: 1 %
Eosinophils Absolute: 0.1 10*3/uL (ref 0.0–0.5)
Eosinophils Relative: 2 %
HCT: 24.7 % — ABNORMAL LOW (ref 39.0–52.0)
Hemoglobin: 8.4 g/dL — ABNORMAL LOW (ref 13.0–17.0)
Immature Granulocytes: 1 %
Lymphocytes Relative: 24 %
Lymphs Abs: 0.8 10*3/uL (ref 0.7–4.0)
MCH: 33.7 pg (ref 26.0–34.0)
MCHC: 34 g/dL (ref 30.0–36.0)
MCV: 99.2 fL (ref 80.0–100.0)
Monocytes Absolute: 0.7 10*3/uL (ref 0.1–1.0)
Monocytes Relative: 19 %
Neutro Abs: 1.8 10*3/uL (ref 1.7–7.7)
Neutrophils Relative %: 53 %
Platelets: 110 10*3/uL — ABNORMAL LOW (ref 150–400)
RBC: 2.49 MIL/uL — ABNORMAL LOW (ref 4.22–5.81)
RDW: 17.5 % — ABNORMAL HIGH (ref 11.5–15.5)
WBC: 3.4 10*3/uL — ABNORMAL LOW (ref 4.0–10.5)
nRBC: 0 % (ref 0.0–0.2)

## 2020-11-09 LAB — SAMPLE TO BLOOD BANK

## 2020-11-09 LAB — CMP (CANCER CENTER ONLY)
ALT: 15 U/L (ref 0–44)
AST: 21 U/L (ref 15–41)
Albumin: 3.3 g/dL — ABNORMAL LOW (ref 3.5–5.0)
Alkaline Phosphatase: 136 U/L — ABNORMAL HIGH (ref 38–126)
Anion gap: 10 (ref 5–15)
BUN: 8 mg/dL (ref 6–20)
CO2: 23 mmol/L (ref 22–32)
Calcium: 9.3 mg/dL (ref 8.9–10.3)
Chloride: 105 mmol/L (ref 98–111)
Creatinine: 0.69 mg/dL (ref 0.61–1.24)
GFR, Estimated: >60 mL/min (ref >60)
Glucose, Bld: 74 mg/dL (ref 70–99)
Potassium: 3.6 mmol/L (ref 3.5–5.1)
Sodium: 138 mmol/L (ref 135–145)
Total Bilirubin: 0.3 mg/dL (ref 0.3–1.2)
Total Protein: 6.9 g/dL (ref 6.5–8.1)

## 2020-11-09 LAB — TSH: TSH: 3.741 u[IU]/mL (ref 0.320–4.118)

## 2020-11-09 LAB — PREPARE RBC (CROSSMATCH)

## 2020-11-09 MED ORDER — SODIUM CHLORIDE 0.9 % IV SOLN
150.0000 mg | Freq: Once | INTRAVENOUS | Status: AC
  Administered 2020-11-09: 150 mg via INTRAVENOUS
  Filled 2020-11-09: qty 150

## 2020-11-09 MED ORDER — HEPARIN SOD (PORK) LOCK FLUSH 100 UNIT/ML IV SOLN
500.0000 [IU] | Freq: Once | INTRAVENOUS | Status: AC | PRN
  Administered 2020-11-09: 500 [IU]

## 2020-11-09 MED ORDER — SODIUM CHLORIDE 0.9 % IV SOLN
10.0000 mg | Freq: Once | INTRAVENOUS | Status: AC
  Administered 2020-11-09: 10 mg via INTRAVENOUS
  Filled 2020-11-09: qty 10

## 2020-11-09 MED ORDER — SODIUM CHLORIDE 0.9% FLUSH
10.0000 mL | INTRAVENOUS | Status: DC | PRN
  Administered 2020-11-09: 10 mL

## 2020-11-09 MED ORDER — PALONOSETRON HCL INJECTION 0.25 MG/5ML
0.2500 mg | Freq: Once | INTRAVENOUS | Status: AC
Start: 2020-11-09 — End: 2020-11-09
  Administered 2020-11-09: 0.25 mg via INTRAVENOUS
  Filled 2020-11-09: qty 5

## 2020-11-09 MED ORDER — APIXABAN 5 MG PO TABS: 5.0000 mg | ORAL_TABLET | Freq: Two times a day (BID) | ORAL | 0 refills | Status: DC

## 2020-11-09 MED ORDER — SODIUM CHLORIDE 0.9 % IV SOLN
522.0000 mg | Freq: Once | INTRAVENOUS | Status: AC
  Administered 2020-11-09: 520 mg via INTRAVENOUS
  Filled 2020-11-09: qty 52

## 2020-11-09 MED ORDER — SODIUM CHLORIDE 0.9 % IV SOLN
200.0000 mg | Freq: Once | INTRAVENOUS | Status: AC
  Administered 2020-11-09: 200 mg via INTRAVENOUS
  Filled 2020-11-09: qty 8

## 2020-11-09 MED ORDER — SODIUM CHLORIDE 0.9 % IV SOLN: Freq: Once | INTRAVENOUS | Status: AC

## 2020-11-09 MED ORDER — SODIUM CHLORIDE 0.9% FLUSH
10.0000 mL | Freq: Once | INTRAVENOUS | Status: AC
  Administered 2020-11-09: 10 mL

## 2020-11-09 NOTE — Progress Notes (Signed)
Albert Flowers CONSULT NOTE  Patient Care Team: Benay Pike, MD as PCP - General (Hematology and Oncology) [name], MD as Consulting Physician (Otolaryngology) [name], MD as Attending Physician (Radiation Oncology) Leota Sauers, RN (Inactive) as Registered Nurse (Oncology) Wynelle Beckmann, Melodie Bouillon, PT as Physical Therapist (Physical Therapy) Sharen Counter, CCC-SLP as Speech Language Pathologist (Speech Pathology) Karie Mainland, RD as Dietitian (Nutrition) Malmfelt, Stephani Police, RN as Oncology Nurse Navigator (Oncology)  CHIEF COMPLAINTS/PURPOSE OF CONSULTATION:   Follow up after recent hospitalization for PE  ASSESSMENT & PLAN:   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  Since his hospital discharge, he has been feeling well, no more hypoxia, chest pain or shortness of breath.  He currently continues on Lovenox twice a day. Have recommended continuing Lovenox for about 30 days and then switching to Eliquis twice a day. We will resume his chemotherapy today and consider a scan in about 4 weeks.  Anemia due to antineoplastic chemotherapy Hemoglobin 8.4 today.  We will arrange for at least 1 unit of packed red blood cell transfusion in next few days.  Pulmonary embolism ([place]) Pulm embolism, likely provoked due to concomitant malignancy, multiple episodes of hospitalization, COVID-19 infection.  He is currently on Lovenox  twice a day for anticoagulation.  We will switch him to Eliquis twice a day at 5 mg (no loading dose required) after he completes 30 days of Lovenox.  Weight loss, unintentional Weight loss of about 8 pounds since his last visit.  Apparently while he was on Megace, he was eating 2-3 bowls of cereal every morning and he currently eats at his normal pace.  We will continue to monitor his weight.  Megace was discontinued because of his PE. I do think his eating habits are appropriate right now hence monitoring is adequate.  No orders of the defined types were placed in this encounter.   HISTORY OF PRESENTING ILLNESS:   Albert Flowers 50 y.o. male is here because of metastatic HPV positive BOT cancer  Oncology History Overview Note   In summary, patient presents with Dr. Constance Holster of ENT in late 03/2018 for evaluation of an enlarging right neck mass.  CT neck showed necrotic right Level II and III necrotic LN's, possibly from oropharyngeal oral cavity primary, but the study was limited due to streak artifacts.  He underwent FNA of the R Level II LN, which showed squamous cell carcinoma, p16+.  PET in 05/2018 showed asymmetric FDG uptake in the right tonsil/base of the tongue, likely the primary malignancy.  In addition, there were two FDG-avid R Level II LN's without evidence of contralateral cervical LN or metastatic disease.  Given the relatively low volume disease, Dr Maylon Peppers recommended upfront surgery, followed by adjuvant therapy based on the final pathology. He had TORS on 06/16/2018.  Pathology from the procedure showed: invasive p16-positive (HPV-related) squamous cell carcinoma, 1.4 cm, negative margins. Out of 24 biopsied lymph nodes, only one was positive for metastatic carcinoma (1/24).  This node was 4.5cm but with no extracapsular extension.  No LVSI and no PNI.  He underwent right neck dissection but not left neck dissection.  At postop follow up, they agreed to proceed with observation and  reserve radiation therapy for any possible future recurrence.   At follow up on 04/07/2019, Dr. [name] noted a new suspicious left upper neck lymph node. Neck CT performed that day revealed: new heterogeneous 2.2 cm contralateral left level 2a lymph node suspicious for contralateral nodal metastasis; indeterminate mild interval increase in size of several left level 2a and 2b lymph nodes.   PET scan performed on 04/16/2019 showed: single hypermetabolic left cervical lymph node; mild asymmetric FDG uptake in left glossotonsillar sulcus; focal FDG uptake in L1 vertebral body without CT correlate; otherwise, no malignant-range FDG uptake elsewhere.  He underwent left tonsillectomy and left neck dissection on 05/04/2019 with pathology revealing: benign left tonsil. Out of a total of 25 biopsied lymph nodes, only one was positive for metastatic carcinoma but negative for extracapsular extension and positive for p16.  He was advised adjuvant radiation at this time. He completed radiation on 07/24/2019. He was seen for FU by Dr Isidore Moos in December when he was doing well. He then started complaining of back pain in February. This led to MRI and PET imaging.  04/26/2020 MRI showed extensive metastatic disease throughout the lumbar spine as well as the sacrum and left iliac bone. No pathologic fracture or epidural Tumor.  05/04/2020 PET CT showed widespread hypermetabolic bone mets. 3.5 cm necrotic hypermetabolic met lesion in right liver. Hypermetabolic uptake in right hilum associated with 2 hypermetabolic lung nodules. Patchy/nodular ground-glass opacity in the right lung having a tree-in-bud configuration. This would be an atypical appearance for metastatic disease and infectious etiology is favored, potentially Atypical.  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 Right level 2 necrotic nodal mass and level 3 necrotic lymph node are highly concerning for nodal metastasis from head and neck primary neoplasm likely oropharynx or the oral cavity. However, evaluation for primary neoplasm in these regions is markedly limited due to streak artifacts from dental amalgam and closed airway possibly secondary to patient's holding breath/swallowing. Recommend direct visual inspection and possibly PET CT scan for further evaluation.  2. Mild asymmetry and fullness of the right nasopharyngeal region, nonspecific.  3. 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   05/07/2018 Procedure   FNA of the R IJ Level II LN    05/07/2018 Pathology Results   ACCESSION NUMBER: [id number]  Right IJ chain lymph node, FNA Squamous cell carcinoma, metastatic; p16+    05/28/2018 Imaging   PET: IMPRESSION: 1. Asymmetric hypermetabolic activity in the RIGHT lingular tonsil/base of tongue region favored primary carcinoma. 2. Two hypermetabolic RIGHT level II metastatic lymph nodes. 3. No LEFT cervical lymphadenopathy.  No distant metastatic disease.   Malignant neoplasm of base of tongue ([place])  04/24/2018 Imaging   CT neck w/ contrast: 1. Right level 2 necrotic nodal mass and level 3 necrotic lymph node are highly concerning for nodal metastasis from head and neck primary neoplasm likely oropharynx or the oral cavity. However, evaluation for primary neoplasm in these regions is markedly limited due to streak artifacts from dental amalgam and closed airway possibly secondary to patient's holding breath/swallowing. Recommend direct visual inspection and possibly PET CT scan for further  evaluation.  2. Mild asymmetry and fullness of the right nasopharyngeal region, nonspecific.  3. 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   05/07/2018 Procedure   FNA of the right cervical LN   05/07/2018 Pathology Results    ACCESSION NUMBER: [id number]  Right IJ chain lymph node, FNA Squamous cell carcinoma, metastatic; p16+    05/28/2018 Imaging   PET: IMPRESSION: 1. Asymmetric hypermetabolic activity in the RIGHT lingular tonsil/base of tongue region favored primary carcinoma. 2. Two hypermetabolic RIGHT level II metastatic lymph nodes. 3. No LEFT cervical lymphadenopathy.  No distant metastatic disease.   06/02/2018 Initial Diagnosis   Malignant neoplasm of base of tongue ([place])   06/02/2018 Cancer Staging   Staging form: Pharynx - HPV-Mediated Oropharynx, AJCC 8th Edition - Clinical: Stage I (cT2, cN1, cM0, p16+) - Signed by [name], MD on 06/02/2018   05/29/2019 Cancer Staging   Staging form: Pharynx - HPV-Mediated Oropharynx, AJCC 8th Edition - Pathologic stage from 05/29/2019: Stage I (rpT0, pN1, cM0, p16+) - Signed by [name], MD on 05/29/2019   06/27/2020 - 06/27/2020 Chemotherapy          07/18/2020 -  Chemotherapy    Patient is on Treatment Plan: HEAD/NECK PEMBROLIZUMAB + CARBOPLATIN + 5FU Q21D X 6 CYCLES / PEMBROLIZUMAB Q21D       Malignant neoplasm of tonsillar fossa ([place])  06/27/2020 - 06/27/2020 Chemotherapy          07/18/2020 -  Chemotherapy    Patient is on Treatment Plan: HEAD/NECK PEMBROLIZUMAB + CARBOPLATIN + 5FU Q21D X 6 CYCLES / PEMBROLIZUMAB Q21D        Interval History  Since last visit here, he was hospitalized twice for pneumonia and most recently for PE. He is feeling better, not needing oxygen.  He is tired a bit, but continues to be active with ADL He is eating normal, wife says he used to eat much more with megace on board. No mouth ulcers. No change in bowel habits No change in urinary habits No new pains. Rest of the pertinent 10 point ROS reviewed and neg  MEDICAL HISTORY:  Past Medical History:  Diagnosis Date   Diabetes mellitus without complication ([place])    Malignant neoplasm of base of tongue ([place]) 06/02/2018    SURGICAL HISTORY: Past  Surgical History:  Procedure Laterality Date   IR IMAGING GUIDED PORT INSERTION  07/15/2020   left neck dissection Left 05/04/2019   Left Neck Dissection by Dr. [name] at [facility].    neck sugery     as a child "had a knot removed from neck" not sure which side.    RIght neck dissection Right 06/16/2018   TORS and right neck dissection. Dr. [name] at [facility]    SOCIAL HISTORY: Social History   Socioeconomic History   Marital status: Married    Spouse name: Not on file   Number of children: 4   Years of education: Not on file   Highest education level: Not on file  Occupational History   Not on file  Tobacco Use   Smoking status: Never   Smokeless tobacco: Never  Vaping Use   Vaping Use: Never used  Substance and Sexual Activity   Alcohol use: Yes    Comment: occasional   Drug use: Never   Sexual activity: Not on file  Other Topics Concern   Not on file  Social History Narrative   Not on file   Social Determinants of Health   Financial  Resource Strain: Not on file  Food Insecurity: Not on file  Transportation Needs: Not on file  Physical Activity: Not on file  Stress: Not on file  Social Connections: Not on file  Intimate Partner Violence: Not on file    FAMILY HISTORY: History reviewed. No pertinent family history.  ALLERGIES:  is allergic to penicillins.  MEDICATIONS:  Current Outpatient Medications  Medication Sig Dispense Refill   [START ON 11/23/2020] apixaban (ELIQUIS) 5 MG TABS tablet Take 1 tablet (5 mg total) by mouth 2 (two) times daily. 60 tablet 0   albuterol (VENTOLIN HFA) 108 (90 Base) MCG/ACT inhaler Inhale 2 puffs into the lungs every 6 (six) hours as needed for wheezing or shortness of breath. 8 g 0   blood glucose meter kit and supplies KIT Dispense based on patient and insurance preference. Use up to four times daily as directed. 1 each 0   diphenhydrAMINE (BENADRYL) 25 MG tablet Take 1 tablet (25 mg total) by mouth at bedtime. 30 tablet     gabapentin (NEURONTIN) 300 MG capsule Take 900 mg by mouth 2 (two) times daily.     glipiZIDE (GLUCOTROL) 5 MG tablet Take 0.5 tablets (2.5 mg total) by mouth daily before breakfast. 45 tablet 0   Insulin Pen Needle 29G X 5MM MISC For sliding scale insulin injection 100 each 0   lactose free nutrition (BOOST) LIQD Take 237 mLs by mouth 3 (three) times daily between meals.     lidocaine (LIDODERM) 5 % Place 1 patch onto the skin daily. Remove & Discard patch within 12 hours or as directed by MD (Patient taking differently: Place 1 patch onto the skin daily as needed (for pain- Remove & Discard patch within 12 hours or as directed by MD).) 30 patch 0   lidocaine-prilocaine (EMLA) cream Apply to affected area once (Patient taking differently: Apply 1 application topically as directed.) 30 g 3   magic mouthwash SOLN Take 15 mLs by mouth 3 (three) times daily.     metFORMIN (GLUCOPHAGE) 500 MG tablet Take 1 tablet (500 mg total) by mouth 2 (two) times daily with a meal. 180 tablet 0   mirtazapine (REMERON) 15 MG tablet Take 2 tablets (30 mg total) by mouth at bedtime. 60 tablet 0   ondansetron (ZOFRAN ODT) 8 MG disintegrating tablet Take 1 tablet (8 mg total) by mouth every 8 (eight) hours as needed for nausea or vomiting. (Patient taking differently: Take 8 mg by mouth every 8 (eight) hours as needed for nausea or vomiting (dissolve orally).) 30 tablet 3   oxyCODONE (OXY IR/ROXICODONE) 5 MG immediate release tablet Take 1 tablet (5 mg total) by mouth at bedtime. 60 tablet 0   polyethylene glycol (MIRALAX / GLYCOLAX) 17 g packet Take 17 g by mouth daily.     prochlorperazine (COMPAZINE) 10 MG tablet Take 10 mg by mouth every 6 (six) hours as needed for nausea or vomiting.     senna (SENOKOT) 8.6 MG TABS tablet Take 1 tablet (8.6 mg total) by mouth 2 (two) times daily. 120 tablet 0   No current facility-administered medications for this visit.   Facility-Administered Medications Ordered in Other Visits   Medication Dose Route Frequency Provider Last Rate Last Admin   0.9 %  sodium chloride infusion   Intravenous Once Aubrianne Molyneux, MD       CARBOplatin (PARAPLATIN) 520 mg in sodium chloride 0.9 % 250 mL chemo infusion  520 mg Intravenous Once Benay Pike, MD  dexamethasone (DECADRON) 10 mg in sodium chloride 0.9 % 50 mL IVPB  10 mg Intravenous Once Gad Aymond, MD       fosaprepitant (EMEND) 150 mg in sodium chloride 0.9 % 145 mL IVPB  150 mg Intravenous Once Imre Vecchione, MD       heparin lock flush 100 unit/mL  500 Units Intracatheter Once PRN Damonica Chopra, [name], MD       palonosetron (ALOXI) injection 0.25 mg  0.25 mg Intravenous Once Dametra Whetsel, [name], MD       pembrolizumab (KEYTRUDA) 200 mg in sodium chloride 0.9 % 50 mL chemo infusion  200 mg Intravenous Once Peola Joynt, MD       sodium chloride flush (NS) 0.9 % injection 10 mL  10 mL Intracatheter PRN Rigoberto Repass, MD   10 mL at 07/22/20 1638   sodium chloride flush (NS) 0.9 % injection 10 mL  10 mL Intracatheter PRN Karsten Vaughn, [name], MD        PHYSICAL EXAMINATION: ECOG PERFORMANCE STATUS: 2 - Symptomatic, <50% confined to bed  Vitals:   11/09/20 1123  BP: 106/75  Pulse: 94  Resp: 18  Temp: 98.4 F (36.9 C)  SpO2: 100%    Filed Weights   11/09/20 1123  Weight: 141 lb 14.4 oz (64.4 kg)   Physical Exam Constitutional:      Appearance: Normal appearance.  Cardiovascular:     Rate and Rhythm: Normal rate and regular rhythm.     Pulses: Normal pulses.     Heart sounds: Normal heart sounds.  Pulmonary:     Effort: Pulmonary effort is normal.     Breath sounds: Normal breath sounds.     Comments: No adv sounds today  Abdominal:     General: Abdomen is flat.     Palpations: Abdomen is soft.  Musculoskeletal:        General: No swelling or tenderness.     Cervical back: Normal range of motion and neck supple. No rigidity.  Lymphadenopathy:     Cervical: No cervical adenopathy.  Skin:    General:  Skin is warm.     Findings: No rash.  Neurological:     Mental Status: He is alert.     LABORATORY DATA:  I have reviewed the data as listed Lab Results  Component Value Date   WBC 3.4 (L) 11/09/2020   HGB 8.4 (L) 11/09/2020   HCT 24.7 (L) 11/09/2020   MCV 99.2 11/09/2020   PLT 110 (L) 11/09/2020     Chemistry      Component Value Date/Time   NA 138 11/09/2020 1107   K 3.6 11/09/2020 1107   CL 105 11/09/2020 1107   CO2 23 11/09/2020 1107   BUN 8 11/09/2020 1107   CREATININE 0.69 11/09/2020 1107      Component Value Date/Time   CALCIUM 9.3 11/09/2020 1107   ALKPHOS 136 (H) 11/09/2020 1107   AST 21 11/09/2020 1107   ALT 15 11/09/2020 1107   BILITOT 0.3 11/09/2020 1107      RADIOGRAPHIC STUDIES: I have personally reviewed the radiological images as listed and agreed with the findings in the report.  DG Chest 2 View  Result Date: 10/26/2020 CLINICAL DATA:  Shortness of breath. EXAM: CHEST - 2 VIEW COMPARISON:  10/02/2020 FINDINGS: Redemonstrated right chest port with catheter tip approximating the cavoatrial junction. Unchanged cardiac and mediastinal contours. Previously noted right medial basilar consolidative opacity is improved. No new focal pulmonary opacity. No pneumothorax. Possible trace pleural effusion. IMPRESSION:  No acute cardiopulmonary process. Electronically Signed   By: Merilyn Baba MD   On: 10/26/2020 14:13   CT HEAD W & WO CONTRAST (5MM)  Result Date: 10/27/2020 CLINICAL DATA:  Initial evaluation for possible metastatic disease, history of squamous cell carcinoma at base of tongue. EXAM: CT HEAD WITHOUT AND WITH CONTRAST TECHNIQUE: Contiguous axial images were obtained from the base of the skull through the vertex without and with intravenous contrast CONTRAST:  50mL OMNIPAQUE IOHEXOL 350 MG/ML SOLN COMPARISON:  None available. FINDINGS: Brain: Cerebral volume within normal limits for patient age. No evidence for acute intracranial hemorrhage. No findings  to suggest acute large vessel territory infarct. No mass lesion, midline shift, or mass effect. Ventricles are normal in size without evidence for hydrocephalus. No extra-axial fluid collection identified. Vascular: No hyperdense vessel seen prior to contrast administration. Normal intravascular enhancement seen throughout the major arterial and venous structures following contrast administration. Skull: Scalp soft tissues demonstrate no acute abnormality. Calvarium intact. Sinuses/Orbits: Globes and orbital soft tissues within normal limits. Visualized paranasal sinuses are clear. No mastoid effusion. IMPRESSION: Normal head CT. No evidence for intracranial hemorrhage or metastatic disease. Electronically Signed   By: Jeannine Boga M.D.   On: 10/27/2020 00:41   CT Angio Chest PE W and/or Wo Contrast  Result Date: 10/26/2020 CLINICAL DATA:  Metastatic head neck cancer. Recurrent pneumonia. Cough. Shortness of breath. EXAM: CT ANGIOGRAPHY CHEST WITH CONTRAST TECHNIQUE: Multidetector CT imaging of the chest was performed using the standard protocol during bolus administration of intravenous contrast. Multiplanar CT image reconstructions and MIPs were obtained to evaluate the vascular anatomy. CONTRAST:  17mL OMNIPAQUE IOHEXOL 350 MG/ML SOLN COMPARISON:  Plain film 10/26/2020.  CT 10/03/2020 FINDINGS: Cardiovascular: The quality of this exam for evaluation of pulmonary embolism is sufficient. There is mild motion degradation throughout. However, a large left-sided pulmonary embolism is seen within the main pulmonary artery and lobar branches. Right heart strain, as evidenced by an RV to LV ratio of 4.6/3.9= 1.2. Aortic atherosclerosis. Normal heart size, without pericardial effusion. Multivessel coronary artery atherosclerosis. Right Port-A-Cath tip mid right atrium. Mediastinum/Nodes: No mediastinal or hilar adenopathy. Lungs/Pleura: Tiny right pleural effusion, minimally decreased. The left-sided pleural  effusion has resolved. Slight improvement in central right lower lobe airspace disease. Mild superior segment right lower lobe airspace disease laterally including on 62/10, new. Multifocal patchy right upper lobe airspace disease is slightly increased. Right upper lobe 5 mm nodule on 38/10 is increased from 2 mm on the prior. A right middle lobe 1.5 cm nodule on 79/10 has minimally enlarged from 1.4 cm on the prior. Somewhat vague pleural-based right lower lobe 7 mm nodule on 54/10 is similar to minimally enlarged from 6 mm on the prior. Upper Abdomen: Motion degradation continuing into the upper abdomen. Grossly normal imaged liver, spleen, stomach, pancreas, gallbladder, adrenal glands, kidneys. Musculoskeletal: Multifocal osseous metastasis again identified, primarily sclerotic in appearance. Anterior left fourth rib osseous and soft tissue density lesion including on 81/4 is relatively similar. An expansile lesion involving the sixth anterior right rib including on 104/4 is felt to be slightly increased. Compression deformity at T8 is moderate and similar. Review of the MIP images confirms the above findings. IMPRESSION: 1. Mildly motion degraded exam. 2. Large volume left-sided pulmonary embolism with evidence of right heart strain. (RV/LV Ratio = 1.2) consistent with at least submassive (intermediate risk) PE. The presence of right heart strain has been associated with an increased risk of morbidity and mortality. Please refer to  the "PE Focused" order set in EPIC. 3. Shifting right-sided pulmonary opacities, most consistent with infection or aspiration. 4. Pulmonary nodules, felt to be slightly progressive and indicative of metastatic disease. 5. Widespread osseous metastasis, mildly progressive as above. 6. Coronary artery atherosclerosis. Aortic Atherosclerosis (ICD10-I70.0). Case was discussed with Dr Nada Libman at 4:25 pm. Electronically Signed   By: Abigail Miyamoto M.D.   On: 10/26/2020 16:59   DG CHEST  PORT 1 VIEW  Result Date: 10/28/2020 CLINICAL DATA:  Chest pain EXAM: PORTABLE CHEST 1 VIEW COMPARISON:  10/26/2020 FINDINGS: Right-sided chest port terminates at the level of the superior cavoatrial junction. Normal heart size. Increasing opacity within the inferior aspect of the right upper lobe. Nodular opacities persist within the right lung. Small right pleural effusion. No pneumothorax. Osseous metastatic disease including proximal right humerus and anterior left fourth rib, better seen on previous day CT. IMPRESSION: 1. Increasing opacity within the inferior aspect of the right upper lobe suspicious for pneumonia. 2. Small right pleural effusion. Electronically Signed   By: Davina Poke D.O.   On: 10/28/2020 14:25   ECHOCARDIOGRAM COMPLETE  Result Date: 10/27/2020    ECHOCARDIOGRAM REPORT   Patient Name:   Albert Flowers Date of Exam: 10/27/2020 Medical Rec #:  [id number]      Height:       70.0 in Accession #:    [id number]     Weight:       148.0 lb Date of Birth:  [birth year]      BSA:          1.837 m Patient Age:    58 years       BP:           110/88 mmHg Patient Gender: M              HR:           104 bpm. Exam Location:  Inpatient Procedure: 2D Echo, Cardiac Doppler and Color Doppler                   STAT ECHO Reported to: Dr Debara Pickett on 10/27/2020 8:14:00 AM. Indications:    Pulmonary embolus  History:        Patient has no prior history of Echocardiogram examinations.                 Risk Factors:Diabetes.  Sonographer:    [place] Referring Phys: [id number] [place]  1. Left ventricular ejection fraction, by estimation, is 55 to 60%. The left ventricle has normal function. The left ventricle has no regional wall motion abnormalities. Left ventricular diastolic parameters are consistent with Grade I diastolic dysfunction (impaired relaxation).  2. Right ventricular systolic function is normal. The right ventricular size is normal. There is moderately elevated  pulmonary artery systolic pressure.  3. The mitral valve is grossly normal. Trivial mitral valve regurgitation.  4. The aortic valve is tricuspid. Aortic valve regurgitation is not visualized. Comparison(s): No prior Echocardiogram. No evidence of RV strain. Conclusion(s)/Recommendation(s): Critical findings reported to Dr. Ander Slade and acknowledged at 10/27/2020, 10:00 am. FINDINGS  Left Ventricle: Left ventricular ejection fraction, by estimation, is 55 to 60%. The left ventricle has normal function. The left ventricle has no regional wall motion abnormalities. The left ventricular internal cavity size was normal in size. There is  no left ventricular hypertrophy. Left ventricular diastolic parameters are consistent with Grade I diastolic dysfunction (impaired relaxation). Indeterminate filling pressures. Right Ventricle: The right ventricular  size is normal. No increase in right ventricular wall thickness. Right ventricular systolic function is normal. There is moderately elevated pulmonary artery systolic pressure. The tricuspid regurgitant velocity is 3.11 m/s, and with an assumed right atrial pressure of 8 mmHg, the estimated right ventricular systolic pressure is 54.3 mmHg. Left Atrium: Left atrial size was normal in size. Right Atrium: Right atrial size was normal in size. Pericardium: There is no evidence of pericardial effusion. Mitral Valve: The mitral valve is grossly normal. Trivial mitral valve regurgitation. Tricuspid Valve: The tricuspid valve is grossly normal. Tricuspid valve regurgitation is mild. Aortic Valve: The aortic valve is tricuspid. Aortic valve regurgitation is not visualized. Aortic valve mean gradient measures 2.0 mmHg. Aortic valve peak gradient measures 4.5 mmHg. Aortic valve area, by VTI measures 3.16 cm. Pulmonic Valve: The pulmonic valve was normal in structure. Pulmonic valve regurgitation is not visualized. Aorta: The aortic root and ascending aorta are structurally normal, with  no evidence of dilitation. IAS/Shunts: No atrial level shunt detected by color flow Doppler.  LEFT VENTRICLE PLAX 2D LVIDd:         4.10 cm     Diastology LVIDs:         2.90 cm     LV e' medial:    8.16 cm/s LV PW:         0.90 cm     LV E/e' medial:  6.4 LV IVS:        0.80 cm     LV e' lateral:   6.53 cm/s LVOT diam:     2.10 cm     LV E/e' lateral: 8.0 LV SV:         47 LV SV Index:   26 LVOT Area:     3.46 cm  LV Volumes (MOD) LV vol d, MOD A2C: 26.4 ml LV vol d, MOD A4C: 42.4 ml LV vol s, MOD A2C: 11.7 ml LV vol s, MOD A4C: 22.1 ml LV SV MOD A2C:     14.7 ml LV SV MOD A4C:     42.4 ml LV SV MOD BP:      18.5 ml RIGHT VENTRICLE RV Basal diam:  3.60 cm RV Mid diam:    2.80 cm RV S prime:     14.90 cm/s TAPSE (M-mode): 1.3 cm LEFT ATRIUM             Index       RIGHT ATRIUM           Index LA Vol (A2C):   12.2 ml 6.64 ml/m  RA Area:     10.60 cm LA Vol (A4C):   20.4 ml 11.11 ml/m RA Volume:   24.50 ml  13.34 ml/m LA Biplane Vol: 16.7 ml 9.09 ml/m  AORTIC VALVE AV Area (Vmax):    3.05 cm AV Area (Vmean):   2.90 cm AV Area (VTI):     3.16 cm AV Vmax:           106.00 cm/s AV Vmean:          72.800 cm/s AV VTI:            0.150 m AV Peak Grad:      4.5 mmHg AV Mean Grad:      2.0 mmHg LVOT Vmax:         93.30 cm/s LVOT Vmean:        61.000 cm/s LVOT VTI:          0.137 m LVOT/AV  VTI ratio: 0.91  AORTA Ao Root diam: 3.30 cm MITRAL VALVE               TRICUSPID VALVE MV Area (PHT): 4.41 cm    TR Peak grad:   38.7 mmHg MV Decel Time: 172 msec    TR Vmax:        311.00 cm/s MV E velocity: 52.00 cm/s MV A velocity: 83.70 cm/s  SHUNTS MV E/A ratio:  0.62        Systemic VTI:  0.14 m                            Systemic Diam: 2.10 cm Lyman Bishop MD Electronically signed by Lyman Bishop MD Signature Date/Time: 10/27/2020/10:03:45 AM    Final     All questions were answered. The patient knows to call the clinic with any problems, questions or concerns.     Benay Pike, MD 11/09/2020 1:15 PM

## 2020-11-09 NOTE — Assessment & Plan Note (Signed)
This is a very pleasant 50 year old male patient with metastatic HPV positive squamous cell carcinoma currently on treatment with 5-fluorouracil, carboplatin and Keytruda, now on Keytruda and carboplatin given severe mucositis with 5-fluorouracil despite dose reduction status post 3 cycles with excellent response on PET scan except for 2 lung nodules which showed some progression recently hospitalized twice for pneumonia and then another hospitalization for pulmonary embolism who is here for follow-up prior to resuming chemotherapy.  Since his hospital discharge, he has been feeling well, no more hypoxia, chest pain or shortness of breath.  He currently continues on Lovenox twice a day. Have recommended continuing Lovenox for about 30 days and then switching to Eliquis twice a day. We will resume his chemotherapy today and consider a scan in about 4 weeks.

## 2020-11-09 NOTE — Patient Instructions (Signed)
[facility]  Discharge Instructions: Thank you for choosing [facility] to provide your oncology and hematology care.   If you have a lab appointment with the [facility], please go directly to the [facility] and check in at the registration area.   Wear comfortable clothing and clothing appropriate for easy access to any Portacath or PICC line.   We strive to give you quality time with your provider. You may need to reschedule your appointment if you arrive late (15 or more minutes).  Arriving late affects you and other patients whose appointments are after yours.  Also, if you miss three or more appointments without notifying the office, you may be dismissed from the clinic at the provider's discretion.      For prescription refill requests, have your pharmacy contact our office and allow 72 hours for refills to be completed.    Today you received the following chemotherapy and/or immunotherapy agents keytruda/carboplatin      To help prevent nausea and vomiting after your treatment, we encourage you to take your nausea medication as directed.  BELOW ARE SYMPTOMS THAT SHOULD BE REPORTED IMMEDIATELY: *FEVER GREATER THAN 100.4 F (38 C) OR HIGHER *CHILLS OR SWEATING *NAUSEA AND VOMITING THAT IS NOT CONTROLLED WITH YOUR NAUSEA MEDICATION *UNUSUAL SHORTNESS OF BREATH *UNUSUAL BRUISING OR BLEEDING *URINARY PROBLEMS (pain or burning when urinating, or frequent urination) *BOWEL PROBLEMS (unusual diarrhea, constipation, pain near the anus) TENDERNESS IN MOUTH AND THROAT WITH OR WITHOUT PRESENCE OF ULCERS (sore throat, sores in mouth, or a toothache) UNUSUAL RASH, SWELLING OR PAIN  UNUSUAL VAGINAL DISCHARGE OR ITCHING   Items with * indicate a potential emergency and should be followed up as soon as possible or go to the Emergency Department if any problems should occur.  Please show the CHEMOTHERAPY ALERT CARD or IMMUNOTHERAPY ALERT CARD at  check-in to the Emergency Department and triage nurse.  Should you have questions after your visit or need to cancel or reschedule your appointment, please contact [facility]  Dept: [phone]  and follow the prompts.  Office hours are 8:00 a.m. to 4:30 p.m. Monday - Friday. Please note that voicemails left after 4:00 p.m. may not be returned until the following business day.  We are closed weekends and major holidays. You have access to a nurse at all times for urgent questions. Please call the main number to the clinic Dept: [phone] and follow the prompts.   For any non-urgent questions, you may also contact your provider using MyChart. We now offer e-Visits for anyone 36 and older to request care online for non-urgent symptoms. For details visit [email or web address].   Also download the MyChart app! Go to the app store, search "MyChart", open the app, select [facility], and log in with your MyChart username and password.  Due to Covid, a mask is required upon entering the hospital/clinic. If you do not have a mask, one will be given to you upon arrival. For doctor visits, patients may have 1 support person aged 87 or older with them. For treatment visits, patients cannot have anyone with them due to current Covid guidelines and our immunocompromised population.

## 2020-11-09 NOTE — Assessment & Plan Note (Signed)
Weight loss of about 8 pounds since his last visit.  Apparently while he was on Megace, he was eating 2-3 bowls of cereal every morning and he currently eats at his normal pace.  We will continue to monitor his weight.  Megace was discontinued because of his PE. I do think his eating habits are appropriate right now hence monitoring is adequate.

## 2020-11-09 NOTE — Assessment & Plan Note (Signed)
Pulm embolism, likely provoked due to concomitant malignancy, multiple episodes of hospitalization, COVID-19 infection.  He is currently on Lovenox twice a day for anticoagulation.  We will switch him to Eliquis twice a day at 5 mg (no loading dose required) after he completes 30 days of Lovenox.

## 2020-11-09 NOTE — Telephone Encounter (Signed)
We don't need to get another superD Ct, we can use the scan from 8/10. I will see him tomorrow to discuss next steps.

## 2020-11-09 NOTE — Assessment & Plan Note (Signed)
Hemoglobin 8.4 today.  We will arrange for at least 1 unit of packed red blood cell transfusion in next few days.

## 2020-11-10 ENCOUNTER — Inpatient Hospital Stay: Payer: Managed Care, Other (non HMO)

## 2020-11-10 ENCOUNTER — Ambulatory Visit (INDEPENDENT_AMBULATORY_CARE_PROVIDER_SITE_OTHER): Payer: Managed Care, Other (non HMO) | Admitting: Emergency Medicine

## 2020-11-10 ENCOUNTER — Encounter: Payer: Self-pay | Admitting: Emergency Medicine

## 2020-11-10 DIAGNOSIS — C01 Malignant neoplasm of base of tongue: Secondary | ICD-10-CM

## 2020-11-10 DIAGNOSIS — I2609 Other pulmonary embolism with acute cor pulmonale: Secondary | ICD-10-CM | POA: Diagnosis not present

## 2020-11-10 DIAGNOSIS — R9389 Abnormal findings on diagnostic imaging of other specified body structures: Secondary | ICD-10-CM | POA: Diagnosis not present

## 2020-11-10 DIAGNOSIS — C090 Malignant neoplasm of tonsillar fossa: Secondary | ICD-10-CM

## 2020-11-10 LAB — T4: T4, Total: 7.1 ug/dL (ref 4.5–12.0)

## 2020-11-10 MED ORDER — SODIUM CHLORIDE 0.9% IV SOLUTION
250.0000 mL | Freq: Once | INTRAVENOUS | Status: AC
  Administered 2020-11-10: 250 mL via INTRAVENOUS

## 2020-11-10 MED ORDER — HEPARIN SOD (PORK) LOCK FLUSH 100 UNIT/ML IV SOLN
500.0000 [IU] | Freq: Every day | INTRAVENOUS | Status: AC | PRN
  Administered 2020-11-10: 500 [IU]

## 2020-11-10 MED ORDER — SODIUM CHLORIDE 0.9% FLUSH
10.0000 mL | INTRAVENOUS | Status: AC | PRN
  Administered 2020-11-10: 10 mL

## 2020-11-10 NOTE — Progress Notes (Signed)
Mr. Albert Flowers presents today for follow-up after completing radiation to the right tonsil on 07/24/2019 and spine + left hip on 05/30/2020  Pain issues, if any: Patient denies and neck or mouth pain. Does report lingering chest discomfort from pneumonia/COVID recovery. Using a feeding tube?: N/A Weight changes, if any:  Wt Readings from Last 3 Encounters:  11/11/20 142 lb 8 oz (64.6 kg)  11/10/20 142 lb (64.4 kg)  11/09/20 141 lb 14.4 oz (64.4 kg)   Swallowing issues, if any: Unable to tolerate very dry food--but otherwise denies any issues. Reports appetite has waned slightly Smoking or chewing tobacco? None  Using fluoride trays daily? No--sees his dentist regularly Last ENT visit was on: Not since he saw Dr. [name] on 12/29/2020 Other notable issues, if any:  Reports his legs feel week today and he is fatigued. Last saw medical oncologist Dr. Chryl Heck on 11/09/2020: "currently on treatment with 5-fluorouracil, carboplatin and Keytruda, now on Keytruda and carboplatin given severe mucositis with 5-fluorouracil despite dose reduction status post 3 cycles...recently hospitalized twice for pneumonia and then another hospitalization for pulmonary embolism who is here for follow-up prior to resuming chemotherapy.  Had F/U with pulmonologist Dr. Lamonte Sakai on 11/10/20:  "Not entirely clear whether the pulmonary nodules noted on his CT chest represent metastatic disease versus primary lung cancer.  Given the number and appearance I suspect that these are mets.  We had discussed possible bronchoscopy with biopsies to establish a tissue diagnosis.  Now that he has had provoked pulmonary embolism I think he needs to stay on his anticoagulation, that it would be high risk for me to stop it to facilitate bronchoscopy.  Further he has been restarted on chemotherapy and Keytruda, so it would seem reasonable to wait until his repeat imaging to determine whether the nodules are responding before committing him to  biopsy.  Even if they do not respond it may be reasonable to consider empiric SBRT without putting him through the risk of bronchoscopy and biopsy.  We can talk about this after his treatment and follow-up PET scan have been performed.  We will consult with Dr. Chryl Heck to decide next steps  Vitals:   11/11/20 1401  BP: 113/75  Pulse: 79  Resp: 18  Temp: 98.8 F (37.1 C)  SpO2: 100%

## 2020-11-10 NOTE — Assessment & Plan Note (Signed)
Submassive pulmonary embolism, provoked.  Now on enoxaparin, planning for 1 month total and then transition to Eliquis

## 2020-11-10 NOTE — Patient Instructions (Signed)
[facility]  Discharge Instructions: Thank you for choosing [facility] to provide your oncology and hematology care.   If you have a lab appointment with the [facility], please go directly to the [facility] and check in at the registration area.   Wear comfortable clothing and clothing appropriate for easy access to any Portacath or PICC line.   We strive to give you quality time with your provider. You may need to reschedule your appointment if you arrive late (15 or more minutes).  Arriving late affects you and other patients whose appointments are after yours.  Also, if you miss three or more appointments without notifying the office, you may be dismissed from the clinic at the provider's discretion.      For prescription refill requests, have your pharmacy contact our office and allow 72 hours for refills to be completed.    Today you received the following chemotherapy and/or immunotherapy agents BLOOD       To help prevent nausea and vomiting after your treatment, we encourage you to take your nausea medication as directed.  BELOW ARE SYMPTOMS THAT SHOULD BE REPORTED IMMEDIATELY: *FEVER GREATER THAN 100.4 F (38 C) OR HIGHER *CHILLS OR SWEATING *NAUSEA AND VOMITING THAT IS NOT CONTROLLED WITH YOUR NAUSEA MEDICATION *UNUSUAL SHORTNESS OF BREATH *UNUSUAL BRUISING OR BLEEDING *URINARY PROBLEMS (pain or burning when urinating, or frequent urination) *BOWEL PROBLEMS (unusual diarrhea, constipation, pain near the anus) TENDERNESS IN MOUTH AND THROAT WITH OR WITHOUT PRESENCE OF ULCERS (sore throat, sores in mouth, or a toothache) UNUSUAL RASH, SWELLING OR PAIN  UNUSUAL VAGINAL DISCHARGE OR ITCHING   Items with * indicate a potential emergency and should be followed up as soon as possible or go to the Emergency Department if any problems should occur.  Please show the CHEMOTHERAPY ALERT CARD or IMMUNOTHERAPY ALERT CARD at check-in to the  Emergency Department and triage nurse.  Should you have questions after your visit or need to cancel or reschedule your appointment, please contact [facility]  Dept: [phone]  and follow the prompts.  Office hours are 8:00 a.m. to 4:30 p.m. Monday - Friday. Please note that voicemails left after 4:00 p.m. may not be returned until the following business day.  We are closed weekends and major holidays. You have access to a nurse at all times for urgent questions. Please call the main number to the clinic Dept: [phone] and follow the prompts.   For any non-urgent questions, you may also contact your provider using MyChart. We now offer e-Visits for anyone 6 and older to request care online for non-urgent symptoms. For details visit [email or web address].   Also download the MyChart app! Go to the app store, search "MyChart", open the app, select [facility], and log in with your MyChart username and password.  Due to Covid, a mask is required upon entering the hospital/clinic. If you do not have a mask, one will be given to you upon arrival. For doctor visits, patients may have 1 support person aged 53 or older with them. For treatment visits, patients cannot have anyone with them due to current Covid guidelines and our immunocompromised population.   Blood Transfusion, Adult, Care After This sheet gives you information about how to care for yourself after your procedure. Your doctor may also give you more specific instructions. If youhave problems or questions, contact your doctor. What can I expect after the procedure? After the procedure, it is  common to have: Bruising and soreness at the IV site. A fever or chills on the day of the procedure. This may be your body's response to the new blood cells received. A headache. Follow these instructions at home: Insertion site care     Follow instructions from your doctor about how to take care of your  insertion site. This is where an IV tube was put into your vein. Make sure you: Wash your hands with soap and water before and after you change your bandage (dressing). If you cannot use soap and water, use hand sanitizer. Change your bandage as told by your doctor. Check your insertion site every day for signs of infection. Check for: Redness, swelling, or pain. Bleeding from the site. Warmth. Pus or a bad smell. General instructions Take over-the-counter and prescription medicines only as told by your doctor. Rest as told by your doctor. Go back to your normal activities as told by your doctor. Keep all follow-up visits as told by your doctor. This is important. Contact a doctor if: You have itching or red, swollen areas of skin (hives). You feel worried or nervous (anxious). You feel weak after doing your normal activities. You have redness, swelling, warmth, or pain around the insertion site. You have blood coming from the insertion site, and the blood does not stop with pressure. You have pus or a bad smell coming from the insertion site. Get help right away if: You have signs of a serious reaction. This may be coming from an allergy or the body's defense system (immune system). Signs include: Trouble breathing or shortness of breath. Swelling of the face or feeling warm (flushed). Fever or chills. Head, chest, or back pain. Dark pee (urine) or blood in the pee. Widespread rash. Fast heartbeat. Feeling dizzy or light-headed. You may receive your blood transfusion in an outpatient setting. If so, youwill be told whom to contact to report any reactions. These symptoms may be an emergency. Do not wait to see if the symptoms will go away. Get medical help right away. Call your local emergency services (911 in the [place] Do not drive yourself to the hospital. Summary Bruising and soreness at the IV site are common. Check your insertion site every day for signs of infection. Rest as  told by your doctor. Go back to your normal activities as told by your doctor. Get help right away if you have signs of a serious reaction. This information is not intended to replace advice given to you by your health care provider. Make sure you discuss any questions you have with your healthcare provider. Document Revised: 08/28/2018 Document Reviewed: 08/28/2018 Elsevier Patient Education  [employer].

## 2020-11-10 NOTE — Progress Notes (Signed)
Subjective:    Patient ID: Albert Flowers, male    DOB: Aug 04, 1970, 50 y.o.   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  He had COVID-19 in 08/2020 that was treated with remdesivir and recovered.  He had a PET scan done on 09/15/2020 reviewed by me that showed a newly enlarged hypermetabolic right middle lobe nodule 12 mm, new hypermetabolic right hilar and medial lower lobe consolidation.  He subsequently was seen by Dr. Halford Chessman earlier this month when he was admitted for right pleuritic chest pain with radiation to his right shoulder, fever, right lower lobe perihilar mass and consolidation.  Treated with broad-spectrum antibiotics.  Presents today for follow-up from the hospitalization for pneumonia (?  [facility]), abnormal imaging. He reports that his breathing is a bit better, cough is less - non productive, no hemoptysis. Still poor energy. He is still working. He has completed his abx from discharge.   CXR day reviewed.  Shows some improvement and right medial basilar infiltrate.  The right middle lobe nodule is not seen.  ROV 11/10/20 --50 year old man with history of metastatic head neck cancer from base of tongue.  I saw him in July after a PET scan revealed an enlarged hypermetabolic right lower lobe nodule as well as right hilar and lower lobe consolidation.  He was admitted and treated for for pneumonia after that scan. Several CT-PA followed > 7/2, 7/18, 8/10.  Unfortunately since then he was readmitted this month with submassive pulmonary embolism 10/26/2020, discharged 10/30/2020 on enoxaparin with plans to transition to Eliquis after 2 weeks. He sees Dr Chryl Heck with oncology.  Planning to resume chemotherapy - got carboplatin, keytruda yesterday.    Review of Systems As per HPI  Past Medical History:  Diagnosis Date   Diabetes mellitus without  complication ([place])    Malignant neoplasm of base of tongue ([place]) 06/02/2018     No family history on file.   Social History   Socioeconomic History   Marital status: Married    Spouse name: Not on file   Number of children: 4   Years of education: Not on file   Highest education level: Not on file  Occupational History   Not on file  Tobacco Use   Smoking status: Never   Smokeless tobacco: Never  Vaping Use   Vaping Use: Never used  Substance and Sexual Activity   Alcohol use: Yes    Comment: occasional   Drug use: Never   Sexual activity: Not on file  Other Topics Concern   Not on file  Social History Narrative   Not on file   Social Determinants of Health   Financial Resource Strain: Not on file  Food Insecurity: Not on file  Transportation Needs: Not on file  Physical Activity: Not on file  Stress: Not on file  Social Connections: Not on file  Intimate Partner Violence: Not on file     Allergies  Allergen Reactions   Penicillins Other (See Comments)    Unknown reaction- from childhood?     Outpatient Medications Prior to Visit  Medication Sig Dispense Refill   albuterol (VENTOLIN HFA) 108 (90 Base) MCG/ACT inhaler Inhale 2 puffs into the lungs every 6 (six) hours as needed for wheezing or shortness of breath. 8 g 0   blood glucose meter kit and supplies KIT Dispense based on patient and  insurance preference. Use up to four times daily as directed. 1 each 0   diphenhydrAMINE (BENADRYL) 25 MG tablet Take 1 tablet (25 mg total) by mouth at bedtime. 30 tablet    gabapentin (NEURONTIN) 300 MG capsule Take 900 mg by mouth 2 (two) times daily.     glipiZIDE (GLUCOTROL) 5 MG tablet Take 0.5 tablets (2.5 mg total) by mouth daily before breakfast. 45 tablet 0   Insulin Pen Needle 29G X 5MM MISC For sliding scale insulin injection 100 each 0   lactose free nutrition (BOOST) LIQD Take 237 mLs by mouth 3 (three) times daily between meals.     lidocaine (LIDODERM) 5 %  Place 1 patch onto the skin daily. Remove & Discard patch within 12 hours or as directed by MD (Patient taking differently: Place 1 patch onto the skin daily as needed (for pain- Remove & Discard patch within 12 hours or as directed by MD).) 30 patch 0   lidocaine-prilocaine (EMLA) cream Apply to affected area once (Patient taking differently: Apply 1 application topically as directed.) 30 g 3   magic mouthwash SOLN Take 15 mLs by mouth 3 (three) times daily.     metFORMIN (GLUCOPHAGE) 500 MG tablet Take 1 tablet (500 mg total) by mouth 2 (two) times daily with a meal. 180 tablet 0   mirtazapine (REMERON) 15 MG tablet Take 2 tablets (30 mg total) by mouth at bedtime. 60 tablet 0   ondansetron (ZOFRAN ODT) 8 MG disintegrating tablet Take 1 tablet (8 mg total) by mouth every 8 (eight) hours as needed for nausea or vomiting. (Patient taking differently: Take 8 mg by mouth every 8 (eight) hours as needed for nausea or vomiting (dissolve orally).) 30 tablet 3   oxyCODONE (OXY IR/ROXICODONE) 5 MG immediate release tablet Take 1 tablet (5 mg total) by mouth at bedtime. 60 tablet 0   polyethylene glycol (MIRALAX / GLYCOLAX) 17 g packet Take 17 g by mouth daily.     [START ON 11/23/2020] apixaban (ELIQUIS) 5 MG TABS tablet Take 1 tablet (5 mg total) by mouth 2 (two) times daily. (Patient not taking: Reported on 11/10/2020) 60 tablet 0   prochlorperazine (COMPAZINE) 10 MG tablet Take 10 mg by mouth every 6 (six) hours as needed for nausea or vomiting. (Patient not taking: Reported on 11/10/2020)     senna (SENOKOT) 8.6 MG TABS tablet Take 1 tablet (8.6 mg total) by mouth 2 (two) times daily. (Patient not taking: Reported on 11/10/2020) 120 tablet 0   Facility-Administered Medications Prior to Visit  Medication Dose Route Frequency Provider Last Rate Last Admin   sodium chloride flush (NS) 0.9 % injection 10 mL  10 mL Intracatheter PRN Benay Pike, MD   10 mL at 07/22/20 1638         Objective:   Physical  Exam  Vitals:   11/10/20 1003  BP: 112/68  Pulse: 86  Temp: 98.3 F (36.8 C)  TempSrc: Oral  SpO2: 99%  Weight: 142 lb (64.4 kg)  Height: $Remove'5\' 9"'EKMudxz$  (1.753 m)   Gen: Pleasant, thin, in no distress,  normal affect  ENT: No lesions,  mouth clear but dry,  oropharynx clear, no postnasal drip  Neck: No JVD, no stridor  Lungs: No use of accessory muscles, decreased BS on R  Cardiovascular: RRR, heart sounds normal, no murmur or gallops, no peripheral edema  Musculoskeletal: No deformities, no cyanosis or clubbing  Neuro: alert, awake, non focal  Skin: Warm, no lesions or rash  Assessment & Plan:   Abnormal CT of the chest Not entirely clear whether the pulmonary nodules noted on his CT chest represent metastatic disease versus primary lung cancer.  Given the number and appearance I suspect that these are mets.  We had discussed possible bronchoscopy with biopsies to establish a tissue diagnosis.  Now that he has had provoked pulmonary embolism I think he needs to stay on his anticoagulation, that it would be high risk for me to stop it to facilitate bronchoscopy.  Further he has been restarted on chemotherapy and Keytruda, so it would seem reasonable to wait until his repeat imaging to determine whether the nodules are responding before committing him to biopsy.  Even if they do not respond it may be reasonable to consider empiric SBRT without putting him through the risk of bronchoscopy and biopsy.  We can talk about this after his treatment and follow-up PET scan have been performed.  We will consult with Dr. Chryl Heck to decide next steps.  Pulmonary embolism (HCC) Submassive pulmonary embolism, provoked.  Now on enoxaparin, planning for 1 month total and then transition to Eliquis   Time spent 34 minutes  Baltazar Apo, MD, PhD 11/10/2020, 5:07 PM [facility] [phone] or if no answer before 7:00PM call [phone] For any issues after 7:00PM please  call eLink [phone]

## 2020-11-10 NOTE — Patient Instructions (Addendum)
Keep albuterol available to use 2 puffs up to every 4 hours if needed for shortness of breath, chest tightness, wheezing.  Agree with plans for enoxaparin and Eliquis as ordered PET scan will be in early October after planned cycle chemotherapy with Dr. Chryl Heck. Follow with Dr. Lamonte Sakai in October after the PET scan.  At that time we can discuss whether there is any indication to perform bronchoscopy and sample your pulmonary nodules.  Alternatively there may be a role for radiation therapy to pulmonary nodules without a biopsy.  We will discuss with Oncology and Radiation Oncology at that time.

## 2020-11-10 NOTE — Assessment & Plan Note (Signed)
Not entirely clear whether the pulmonary nodules noted on his CT chest represent metastatic disease versus primary lung cancer.  Given the number and appearance I suspect that these are mets.  We had discussed possible bronchoscopy with biopsies to establish a tissue diagnosis.  Now that he has had provoked pulmonary embolism I think he needs to stay on his anticoagulation, that it would be high risk for me to stop it to facilitate bronchoscopy.  Further he has been restarted on chemotherapy and Keytruda, so it would seem reasonable to wait until his repeat imaging to determine whether the nodules are responding before committing him to biopsy.  Even if they do not respond it may be reasonable to consider empiric SBRT without putting him through the risk of bronchoscopy and biopsy.  We can talk about this after his treatment and follow-up PET scan have been performed.  We will consult with Dr. Chryl Heck to decide next steps.

## 2020-11-11 ENCOUNTER — Telehealth: Payer: Self-pay | Admitting: *Deleted

## 2020-11-11 ENCOUNTER — Other Ambulatory Visit: Payer: Self-pay

## 2020-11-11 ENCOUNTER — Ambulatory Visit
Admission: RE | Admit: 2020-11-11 | Discharge: 2020-11-11 | Disposition: A | Discharge status: Home / Self Care | Payer: Managed Care, Other (non HMO) | Source: Ambulatory Visit | Attending: Radiation Oncology | Admitting: Radiation Oncology

## 2020-11-11 ENCOUNTER — Encounter: Payer: Self-pay | Admitting: Radiation Oncology

## 2020-11-11 VITALS — BP 113/75 | HR 79 | Temp 98.8°F | Resp 18 | Ht 69.0 in | Wt 142.5 lb

## 2020-11-11 DIAGNOSIS — J90 Pleural effusion, not elsewhere classified: Secondary | ICD-10-CM | POA: Insufficient documentation

## 2020-11-11 DIAGNOSIS — C01 Malignant neoplasm of base of tongue: Secondary | ICD-10-CM

## 2020-11-11 DIAGNOSIS — Z7901 Long term (current) use of anticoagulants: Secondary | ICD-10-CM | POA: Diagnosis not present

## 2020-11-11 DIAGNOSIS — Z79899 Other long term (current) drug therapy: Secondary | ICD-10-CM | POA: Diagnosis not present

## 2020-11-11 DIAGNOSIS — I2699 Other pulmonary embolism without acute cor pulmonale: Secondary | ICD-10-CM | POA: Insufficient documentation

## 2020-11-11 DIAGNOSIS — C090 Malignant neoplasm of tonsillar fossa: Secondary | ICD-10-CM | POA: Insufficient documentation

## 2020-11-11 DIAGNOSIS — Z87891 Personal history of nicotine dependence: Secondary | ICD-10-CM | POA: Diagnosis not present

## 2020-11-11 DIAGNOSIS — Z794 Long term (current) use of insulin: Secondary | ICD-10-CM | POA: Insufficient documentation

## 2020-11-11 DIAGNOSIS — C7951 Secondary malignant neoplasm of bone: Secondary | ICD-10-CM

## 2020-11-11 DIAGNOSIS — I700 Atherosclerosis of aorta: Secondary | ICD-10-CM | POA: Diagnosis not present

## 2020-11-11 DIAGNOSIS — Z923 Personal history of irradiation: Secondary | ICD-10-CM | POA: Insufficient documentation

## 2020-11-11 DIAGNOSIS — R918 Other nonspecific abnormal finding of lung field: Secondary | ICD-10-CM | POA: Diagnosis not present

## 2020-11-11 DIAGNOSIS — C760 Malignant neoplasm of head, face and neck: Secondary | ICD-10-CM

## 2020-11-11 LAB — TYPE AND SCREEN
ABO/RH(D): O POS
Antibody Screen: NEGATIVE
Unit division: 0

## 2020-11-11 LAB — BPAM RBC
Blood Product Expiration Date: 202209262359
ISSUE DATE / TIME: 202208251142
Unit Type and Rh: 5100

## 2020-11-11 NOTE — Progress Notes (Signed)
Radiation Oncology         [phone]) [phone] ________________________________  Name: Albert Flowers MRN: [id number]  Date: 11/11/2020  DOB: 1970/03/25  Follow-Up Visit Note  Outpatient  CC: Benay Pike, MD  [name], MD  Diagnosis and Prior Radiotherapy:    ICD-10-CM   1. 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 Malignant neoplasm of base of tongue ([facility])  C01       CHIEF COMPLAINT: Here for follow-up and surveillance of metastatic head and neck cancer  Narrative:  The patient returns today for routine follow-up.  He is recovering from a recent hospitalization for pneumonia and pulmonary embolism.  He had a CT scan performed of his chest which showed mild progression of pulmonary nodules.  I have personally reviewed his imaging.  Of note, the patient does not have a history of smoking.  He saw Dr. Lamonte Sakai yesterday.  They discussed possible bronchoscopy to establish a tissue diagnosis but there are no plans for this in the near future.  He has been receiving systemic therapy under the care of Dr. Chryl Heck.  The patient denies any significant bone pain                              ALLERGIES:  is allergic to penicillins.  Meds: Current Outpatient Medications  Medication Sig Dispense Refill   albuterol (VENTOLIN HFA) 108 (90 Base) MCG/ACT inhaler Inhale 2 puffs into the lungs every 6 (six) hours as needed for wheezing or shortness of breath. 8 g 0   [START ON 11/23/2020] apixaban (ELIQUIS) 5 MG TABS tablet Take 1 tablet (5 mg total) by mouth 2 (two) times daily. (Patient not taking: Reported on 11/10/2020) 60 tablet 0   blood glucose meter kit and supplies KIT Dispense based on patient and insurance preference. Use up to four times daily as directed. 1 each 0   diphenhydrAMINE (BENADRYL) 25 MG tablet Take 1 tablet (25 mg total) by mouth at bedtime. 30 tablet    gabapentin (NEURONTIN) 300 MG capsule Take 900 mg by mouth 2  (two) times daily.     glipiZIDE (GLUCOTROL) 5 MG tablet Take 0.5 tablets (2.5 mg total) by mouth daily before breakfast. 45 tablet 0   Insulin Pen Needle 29G X 5MM MISC For sliding scale insulin injection 100 each 0   lactose free nutrition (BOOST) LIQD Take 237 mLs by mouth 3 (three) times daily between meals.     lidocaine (LIDODERM) 5 % Place 1 patch onto the skin daily. Remove & Discard patch within 12 hours or as directed by MD (Patient taking differently: Place 1 patch onto the skin daily as needed (for pain- Remove & Discard patch within 12 hours or as directed by MD).) 30 patch 0   lidocaine-prilocaine (EMLA) cream Apply to affected area once (Patient taking differently: Apply 1 application topically as directed.) 30 g 3   magic mouthwash SOLN Take 15 mLs by mouth 3 (three) times daily.     metFORMIN (GLUCOPHAGE) 500 MG tablet Take 1 tablet (500 mg total) by mouth 2 (two) times daily with a meal. 180 tablet 0   mirtazapine (REMERON) 15 MG tablet Take 2 tablets (30 mg total) by mouth at bedtime. 60 tablet 0   ondansetron (ZOFRAN ODT) 8 MG disintegrating tablet Take 1 tablet (8 mg total) by mouth  every 8 (eight) hours as needed for nausea or vomiting. (Patient taking differently: Take 8 mg by mouth every 8 (eight) hours as needed for nausea or vomiting (dissolve orally).) 30 tablet 3   oxyCODONE (OXY IR/ROXICODONE) 5 MG immediate release tablet Take 1 tablet (5 mg total) by mouth at bedtime. 60 tablet 0   polyethylene glycol (MIRALAX / GLYCOLAX) 17 g packet Take 17 g by mouth daily.     prochlorperazine (COMPAZINE) 10 MG tablet Take 10 mg by mouth every 6 (six) hours as needed for nausea or vomiting. (Patient not taking: Reported on 11/10/2020)     senna (SENOKOT) 8.6 MG TABS tablet Take 1 tablet (8.6 mg total) by mouth 2 (two) times daily. (Patient not taking: Reported on 11/10/2020) 120 tablet 0   No current facility-administered medications for this encounter.   Facility-Administered  Medications Ordered in Other Encounters  Medication Dose Route Frequency Provider Last Rate Last Admin   sodium chloride flush (NS) 0.9 % injection 10 mL  10 mL Intracatheter PRN Benay Pike, MD   10 mL at 07/22/20 1638    Physical Findings: The patient is in no acute distress. Patient is alert and oriented.  height is $RemoveB'5\' 9"'BMFHsmlv$  (1.753 m) and weight is 142 lb 8 oz (64.6 kg). His oral temperature is 98.8 F (37.1 C). His blood pressure is 113/75 and his pulse is 79. His respiration is 18 and oxygen saturation is 100%. .    Gen: He appears tired and thin Skin: Pale HEENT: No lesions in the mouth or upper throat Neck without any palpable adenopathy Heart is regular in rate and rhythm Chest clear to auscultation bilaterally  ECOG = 1  0 - Asymptomatic (Fully active, able to carry on all predisease activities without restriction)  1 - Symptomatic but completely ambulatory (Restricted in physically strenuous activity but ambulatory and able to carry out work of a light or sedentary nature. For example, light housework, office work)  2 - Symptomatic, <50% in bed during the day (Ambulatory and capable of all self care but unable to carry out any work activities. Up and about more than 50% of waking hours)  3 - Symptomatic, >50% in bed, but not bedbound (Capable of only limited self-care, confined to bed or chair 50% or more of waking hours)  4 - Bedbound (Completely disabled. Cannot carry on any self-care. Totally confined to bed or chair)  5 - Death   Eustace Pen MM, Creech RH, Tormey DC, et al. [phone]). "Toxicity and response criteria of the [facility] Group". [place] Oncol. 5 (6): 649-55   Lab Findings: Lab Results  Component Value Date   WBC 3.4 (L) 11/09/2020   HGB 8.4 (L) 11/09/2020   HCT 24.7 (L) 11/09/2020   MCV 99.2 11/09/2020   PLT 110 (L) 11/09/2020    Radiographic Findings: DG Chest 2 View  Result Date: 10/26/2020 CLINICAL DATA:  Shortness of breath.  EXAM: CHEST - 2 VIEW COMPARISON:  10/02/2020 FINDINGS: Redemonstrated right chest port with catheter tip approximating the cavoatrial junction. Unchanged cardiac and mediastinal contours. Previously noted right medial basilar consolidative opacity is improved. No new focal pulmonary opacity. No pneumothorax. Possible trace pleural effusion. IMPRESSION: No acute cardiopulmonary process. Electronically Signed   By: Merilyn Baba MD   On: 10/26/2020 14:13   CT HEAD W & WO CONTRAST (5MM)  Result Date: 10/27/2020 CLINICAL DATA:  Initial evaluation for possible metastatic disease, history of squamous cell carcinoma at base of tongue. EXAM:  CT HEAD WITHOUT AND WITH CONTRAST TECHNIQUE: Contiguous axial images were obtained from the base of the skull through the vertex without and with intravenous contrast CONTRAST:  51mL OMNIPAQUE IOHEXOL 350 MG/ML SOLN COMPARISON:  None available. FINDINGS: Brain: Cerebral volume within normal limits for patient age. No evidence for acute intracranial hemorrhage. No findings to suggest acute large vessel territory infarct. No mass lesion, midline shift, or mass effect. Ventricles are normal in size without evidence for hydrocephalus. No extra-axial fluid collection identified. Vascular: No hyperdense vessel seen prior to contrast administration. Normal intravascular enhancement seen throughout the major arterial and venous structures following contrast administration. Skull: Scalp soft tissues demonstrate no acute abnormality. Calvarium intact. Sinuses/Orbits: Globes and orbital soft tissues within normal limits. Visualized paranasal sinuses are clear. No mastoid effusion. IMPRESSION: Normal head CT. No evidence for intracranial hemorrhage or metastatic disease. Electronically Signed   By: Jeannine Boga M.D.   On: 10/27/2020 00:41   CT Angio Chest PE W and/or Wo Contrast  Result Date: 10/26/2020 CLINICAL DATA:  Metastatic head neck cancer. Recurrent pneumonia. Cough.  Shortness of breath. EXAM: CT ANGIOGRAPHY CHEST WITH CONTRAST TECHNIQUE: Multidetector CT imaging of the chest was performed using the standard protocol during bolus administration of intravenous contrast. Multiplanar CT image reconstructions and MIPs were obtained to evaluate the vascular anatomy. CONTRAST:  70mL OMNIPAQUE IOHEXOL 350 MG/ML SOLN COMPARISON:  Plain film 10/26/2020.  CT 10/03/2020 FINDINGS: Cardiovascular: The quality of this exam for evaluation of pulmonary embolism is sufficient. There is mild motion degradation throughout. However, a large left-sided pulmonary embolism is seen within the main pulmonary artery and lobar branches. Right heart strain, as evidenced by an RV to LV ratio of 4.6/3.9= 1.2. Aortic atherosclerosis. Normal heart size, without pericardial effusion. Multivessel coronary artery atherosclerosis. Right Port-A-Cath tip mid right atrium. Mediastinum/Nodes: No mediastinal or hilar adenopathy. Lungs/Pleura: Tiny right pleural effusion, minimally decreased. The left-sided pleural effusion has resolved. Slight improvement in central right lower lobe airspace disease. Mild superior segment right lower lobe airspace disease laterally including on 62/10, new. Multifocal patchy right upper lobe airspace disease is slightly increased. Right upper lobe 5 mm nodule on 38/10 is increased from 2 mm on the prior. A right middle lobe 1.5 cm nodule on 79/10 has minimally enlarged from 1.4 cm on the prior. Somewhat vague pleural-based right lower lobe 7 mm nodule on 54/10 is similar to minimally enlarged from 6 mm on the prior. Upper Abdomen: Motion degradation continuing into the upper abdomen. Grossly normal imaged liver, spleen, stomach, pancreas, gallbladder, adrenal glands, kidneys. Musculoskeletal: Multifocal osseous metastasis again identified, primarily sclerotic in appearance. Anterior left fourth rib osseous and soft tissue density lesion including on 81/4 is relatively similar. An  expansile lesion involving the sixth anterior right rib including on 104/4 is felt to be slightly increased. Compression deformity at T8 is moderate and similar. Review of the MIP images confirms the above findings. IMPRESSION: 1. Mildly motion degraded exam. 2. Large volume left-sided pulmonary embolism with evidence of right heart strain. (RV/LV Ratio = 1.2) consistent with at least submassive (intermediate risk) PE. The presence of right heart strain has been associated with an increased risk of morbidity and mortality. Please refer to the "PE Focused" order set in EPIC. 3. Shifting right-sided pulmonary opacities, most consistent with infection or aspiration. 4. Pulmonary nodules, felt to be slightly progressive and indicative of metastatic disease. 5. Widespread osseous metastasis, mildly progressive as above. 6. Coronary artery atherosclerosis. Aortic Atherosclerosis (ICD10-I70.0). Case was discussed with  Dr Nada Libman at 4:25 pm. Electronically Signed   By: Abigail Miyamoto M.D.   On: 10/26/2020 16:59   DG CHEST PORT 1 VIEW  Result Date: 10/28/2020 CLINICAL DATA:  Chest pain EXAM: PORTABLE CHEST 1 VIEW COMPARISON:  10/26/2020 FINDINGS: Right-sided chest port terminates at the level of the superior cavoatrial junction. Normal heart size. Increasing opacity within the inferior aspect of the right upper lobe. Nodular opacities persist within the right lung. Small right pleural effusion. No pneumothorax. Osseous metastatic disease including proximal right humerus and anterior left fourth rib, better seen on previous day CT. IMPRESSION: 1. Increasing opacity within the inferior aspect of the right upper lobe suspicious for pneumonia. 2. Small right pleural effusion. Electronically Signed   By: Davina Poke D.O.   On: 10/28/2020 14:25   ECHOCARDIOGRAM COMPLETE  Result Date: 10/27/2020    ECHOCARDIOGRAM REPORT   Patient Name:   XYLER TERPENING Date of Exam: 10/27/2020 Medical Rec #:  [id number]      Height:        70.0 in Accession #:    [id number]     Weight:       148.0 lb Date of Birth:  21-Jul-1970      BSA:          1.837 m Patient Age:    32 years       BP:           110/88 mmHg Patient Gender: M              HR:           104 bpm. Exam Location:  Inpatient Procedure: 2D Echo, Cardiac Doppler and Color Doppler                   STAT ECHO Reported to: Dr Debara Pickett on 10/27/2020 8:14:00 AM. Indications:    Pulmonary embolus  History:        Patient has no prior history of Echocardiogram examinations.                 Risk Factors:Diabetes.  Sonographer:    [place] Referring Phys: [id number] [place]  1. Left ventricular ejection fraction, by estimation, is 55 to 60%. The left ventricle has normal function. The left ventricle has no regional wall motion abnormalities. Left ventricular diastolic parameters are consistent with Grade I diastolic dysfunction (impaired relaxation).  2. Right ventricular systolic function is normal. The right ventricular size is normal. There is moderately elevated pulmonary artery systolic pressure.  3. The mitral valve is grossly normal. Trivial mitral valve regurgitation.  4. The aortic valve is tricuspid. Aortic valve regurgitation is not visualized. Comparison(s): No prior Echocardiogram. No evidence of RV strain. Conclusion(s)/Recommendation(s): Critical findings reported to Dr. Ander Slade and acknowledged at 10/27/2020, 10:00 am. FINDINGS  Left Ventricle: Left ventricular ejection fraction, by estimation, is 55 to 60%. The left ventricle has normal function. The left ventricle has no regional wall motion abnormalities. The left ventricular internal cavity size was normal in size. There is  no left ventricular hypertrophy. Left ventricular diastolic parameters are consistent with Grade I diastolic dysfunction (impaired relaxation). Indeterminate filling pressures. Right Ventricle: The right ventricular size is normal. No increase in right ventricular wall thickness. Right  ventricular systolic function is normal. There is moderately elevated pulmonary artery systolic pressure. The tricuspid regurgitant velocity is 3.11 m/s, and with an assumed right atrial pressure of 8 mmHg, the estimated right ventricular systolic pressure is 42.5 mmHg.  Left Atrium: Left atrial size was normal in size. Right Atrium: Right atrial size was normal in size. Pericardium: There is no evidence of pericardial effusion. Mitral Valve: The mitral valve is grossly normal. Trivial mitral valve regurgitation. Tricuspid Valve: The tricuspid valve is grossly normal. Tricuspid valve regurgitation is mild. Aortic Valve: The aortic valve is tricuspid. Aortic valve regurgitation is not visualized. Aortic valve mean gradient measures 2.0 mmHg. Aortic valve peak gradient measures 4.5 mmHg. Aortic valve area, by VTI measures 3.16 cm. Pulmonic Valve: The pulmonic valve was normal in structure. Pulmonic valve regurgitation is not visualized. Aorta: The aortic root and ascending aorta are structurally normal, with no evidence of dilitation. IAS/Shunts: No atrial level shunt detected by color flow Doppler.  LEFT VENTRICLE PLAX 2D LVIDd:         4.10 cm     Diastology LVIDs:         2.90 cm     LV e' medial:    8.16 cm/s LV PW:         0.90 cm     LV E/e' medial:  6.4 LV IVS:        0.80 cm     LV e' lateral:   6.53 cm/s LVOT diam:     2.10 cm     LV E/e' lateral: 8.0 LV SV:         47 LV SV Index:   26 LVOT Area:     3.46 cm  LV Volumes (MOD) LV vol d, MOD A2C: 26.4 ml LV vol d, MOD A4C: 42.4 ml LV vol s, MOD A2C: 11.7 ml LV vol s, MOD A4C: 22.1 ml LV SV MOD A2C:     14.7 ml LV SV MOD A4C:     42.4 ml LV SV MOD BP:      18.5 ml RIGHT VENTRICLE RV Basal diam:  3.60 cm RV Mid diam:    2.80 cm RV S prime:     14.90 cm/s TAPSE (M-mode): 1.3 cm LEFT ATRIUM             Index       RIGHT ATRIUM           Index LA Vol (A2C):   12.2 ml 6.64 ml/m  RA Area:     10.60 cm LA Vol (A4C):   20.4 ml 11.11 ml/m RA Volume:   24.50 ml   13.34 ml/m LA Biplane Vol: 16.7 ml 9.09 ml/m  AORTIC VALVE AV Area (Vmax):    3.05 cm AV Area (Vmean):   2.90 cm AV Area (VTI):     3.16 cm AV Vmax:           106.00 cm/s AV Vmean:          72.800 cm/s AV VTI:            0.150 m AV Peak Grad:      4.5 mmHg AV Mean Grad:      2.0 mmHg LVOT Vmax:         93.30 cm/s LVOT Vmean:        61.000 cm/s LVOT VTI:          0.137 m LVOT/AV VTI ratio: 0.91  AORTA Ao Root diam: 3.30 cm MITRAL VALVE               TRICUSPID VALVE MV Area (PHT): 4.41 cm    TR Peak grad:   38.7 mmHg MV Decel Time: 172 msec  TR Vmax:        311.00 cm/s MV E velocity: 52.00 cm/s MV A velocity: 83.70 cm/s  SHUNTS MV E/A ratio:  0.62        Systemic VTI:  0.14 m                            Systemic Diam: 2.10 cm Lyman Bishop MD Electronically signed by Lyman Bishop MD Signature Date/Time: 10/27/2020/10:03:45 AM    Final     Impression/Plan:   I discussed the patient's symptoms and imaging with him.  The pulmonary nodules are still relatively small and I do not think that SBRT would improve his prognosis.  I recommend that he continue with systemic therapy as recommended by medical oncology.  Given that he does not have a smoking history I am quite confident that the nodules are related to his metastatic head neck cancer and are not new primaries.  I do not think biopsy is necessary.  He will continue to follow closely with medical oncology.  He and his wife would like to see me back on an as-needed basis.  They know to call if they have any issues in the future that we can help him with.  I wished him the best moving forward.  On date of service, in total, I spent 20 minutes on this encounter. Patient was seen in person.  _____________________________________   [name], MD

## 2020-11-11 NOTE — Telephone Encounter (Signed)
CALLED PATIENT TO ASK ABOUT RESCHEDULING TODAY'S FU, PATIENT AGREED TO COME ON 11-11-20 @ 2 PM, INFORMED DR. [place]

## 2020-11-15 ENCOUNTER — Encounter: Payer: Self-pay | Admitting: Hematology and Oncology

## 2020-11-16 ENCOUNTER — Encounter: Payer: Self-pay | Admitting: Hematology and Oncology

## 2020-11-24 ENCOUNTER — Telehealth: Payer: Self-pay

## 2020-11-24 NOTE — Telephone Encounter (Signed)
Notified Patient of completion of FMLA paperwork for himself and for his spouse. Fax transmission and e-mail transmission confirmations received. Copies placed for pick up at Registration as requested

## 2020-11-28 ENCOUNTER — Encounter: Payer: Self-pay | Admitting: Hematology and Oncology

## 2020-11-30 ENCOUNTER — Inpatient Hospital Stay: Payer: Managed Care, Other (non HMO)

## 2020-11-30 ENCOUNTER — Other Ambulatory Visit: Payer: Self-pay

## 2020-11-30 ENCOUNTER — Inpatient Hospital Stay (HOSPITAL_BASED_OUTPATIENT_CLINIC_OR_DEPARTMENT_OTHER): Payer: Managed Care, Other (non HMO) | Admitting: Hematology and Oncology

## 2020-11-30 ENCOUNTER — Inpatient Hospital Stay: Payer: Managed Care, Other (non HMO) | Attending: Hematology and Oncology

## 2020-11-30 ENCOUNTER — Inpatient Hospital Stay: Payer: Managed Care, Other (non HMO) | Admitting: Dietician

## 2020-11-30 ENCOUNTER — Other Ambulatory Visit: Payer: Self-pay | Admitting: Hematology and Oncology

## 2020-11-30 ENCOUNTER — Encounter: Payer: Self-pay | Admitting: Hematology and Oncology

## 2020-11-30 ENCOUNTER — Telehealth: Payer: Self-pay | Admitting: Dietician

## 2020-11-30 DIAGNOSIS — C01 Malignant neoplasm of base of tongue: Secondary | ICD-10-CM | POA: Diagnosis not present

## 2020-11-30 DIAGNOSIS — E119 Type 2 diabetes mellitus without complications: Secondary | ICD-10-CM | POA: Diagnosis not present

## 2020-11-30 DIAGNOSIS — D759 Disease of blood and blood-forming organs, unspecified: Secondary | ICD-10-CM | POA: Diagnosis not present

## 2020-11-30 DIAGNOSIS — Z5112 Encounter for antineoplastic immunotherapy: Secondary | ICD-10-CM | POA: Diagnosis not present

## 2020-11-30 DIAGNOSIS — Z79899 Other long term (current) drug therapy: Secondary | ICD-10-CM | POA: Insufficient documentation

## 2020-11-30 DIAGNOSIS — Z95828 Presence of other vascular implants and grafts: Secondary | ICD-10-CM

## 2020-11-30 DIAGNOSIS — C090 Malignant neoplasm of tonsillar fossa: Secondary | ICD-10-CM

## 2020-11-30 DIAGNOSIS — C787 Secondary malignant neoplasm of liver and intrahepatic bile duct: Secondary | ICD-10-CM | POA: Diagnosis not present

## 2020-11-30 DIAGNOSIS — R634 Abnormal weight loss: Secondary | ICD-10-CM | POA: Insufficient documentation

## 2020-11-30 DIAGNOSIS — Z86711 Personal history of pulmonary embolism: Secondary | ICD-10-CM | POA: Insufficient documentation

## 2020-11-30 DIAGNOSIS — T451X5A Adverse effect of antineoplastic and immunosuppressive drugs, initial encounter: Secondary | ICD-10-CM | POA: Diagnosis not present

## 2020-11-30 DIAGNOSIS — Z7984 Long term (current) use of oral hypoglycemic drugs: Secondary | ICD-10-CM | POA: Diagnosis not present

## 2020-11-30 LAB — CMP (CANCER CENTER ONLY)
ALT: 11 U/L (ref 0–44)
AST: 19 U/L (ref 15–41)
Albumin: 3.4 g/dL — ABNORMAL LOW (ref 3.5–5.0)
Alkaline Phosphatase: 125 U/L (ref 38–126)
Anion gap: 8 (ref 5–15)
BUN: 7 mg/dL (ref 6–20)
CO2: 25 mmol/L (ref 22–32)
Calcium: 9.3 mg/dL (ref 8.9–10.3)
Chloride: 105 mmol/L (ref 98–111)
Creatinine: 0.71 mg/dL (ref 0.61–1.24)
GFR, Estimated: >60 mL/min (ref >60)
Glucose, Bld: 138 mg/dL — ABNORMAL HIGH (ref 70–99)
Potassium: 3.8 mmol/L (ref 3.5–5.1)
Sodium: 138 mmol/L (ref 135–145)
Total Bilirubin: 0.3 mg/dL (ref 0.3–1.2)
Total Protein: 6.3 g/dL — ABNORMAL LOW (ref 6.5–8.1)

## 2020-11-30 LAB — CBC WITH DIFFERENTIAL/PLATELET
Abs Immature Granulocytes: 0.02 10*3/uL (ref 0.00–0.07)
Basophils Absolute: 0 10*3/uL (ref 0.0–0.1)
Basophils Relative: 1 %
Eosinophils Absolute: 0 10*3/uL (ref 0.0–0.5)
Eosinophils Relative: 1 %
HCT: 22.7 % — ABNORMAL LOW (ref 39.0–52.0)
Hemoglobin: 7.7 g/dL — ABNORMAL LOW (ref 13.0–17.0)
Immature Granulocytes: 1 %
Lymphocytes Relative: 15 %
Lymphs Abs: 0.3 10*3/uL — ABNORMAL LOW (ref 0.7–4.0)
MCH: 33.9 pg (ref 26.0–34.0)
MCHC: 33.9 g/dL (ref 30.0–36.0)
MCV: 100 fL (ref 80.0–100.0)
Monocytes Absolute: 0.4 10*3/uL (ref 0.1–1.0)
Monocytes Relative: 21 %
Neutro Abs: 1.1 10*3/uL — ABNORMAL LOW (ref 1.7–7.7)
Neutrophils Relative %: 61 %
Platelets: 70 10*3/uL — ABNORMAL LOW (ref 150–400)
RBC: 2.27 MIL/uL — ABNORMAL LOW (ref 4.22–5.81)
RDW: 17.8 % — ABNORMAL HIGH (ref 11.5–15.5)
WBC: 1.7 10*3/uL — ABNORMAL LOW (ref 4.0–10.5)
nRBC: 0 % (ref 0.0–0.2)

## 2020-11-30 LAB — SAMPLE TO BLOOD BANK

## 2020-11-30 LAB — TSH: TSH: 1.105 u[IU]/mL (ref 0.320–4.118)

## 2020-11-30 MED ORDER — SODIUM CHLORIDE 0.9% FLUSH
10.0000 mL | Freq: Once | INTRAVENOUS | Status: AC
  Administered 2020-11-30: 10 mL

## 2020-11-30 NOTE — Assessment & Plan Note (Signed)
Weight loss, lost about 5 to 6 pounds since last visit.  He has not been eating well since he was tapered off of Megace.  We have discussed about considering restarting Megace since he is already on anticoagulation versus restarting Remeron.  I have only asked them to try 1 or the other.  He is inclined to try Megace and he will try some Tylenol PM for sleep.  We will continue to monitor his weight.

## 2020-11-30 NOTE — Assessment & Plan Note (Signed)
He was found to have progressive leukopenia and thrombocytopenia on labs today.  We will plan to delay chemotherapy by week. If his labs are satisfactory next week, he can proceed with chemotherapy as planned followed by repeat imaging to reassess response.  No indication for transfusion today.

## 2020-11-30 NOTE — Assessment & Plan Note (Signed)
This is a very pleasant 50 year old male patient with metastatic HPV positive squamous cell carcinoma currently on treatment with 5-fluorouracil, carboplatin and Keytruda, now on Keytruda and carboplatin given severe mucositis with 5-fluorouracil despite dose reduction status post 3 cycles with excellent response on PET scan except for 2 lung nodules which showed some progression recently hospitalized twice for pneumonia and then another hospitalization for pulmonary embolism who is here for follow-up prior after his last chemotherapy.  He has received about 5 cycles of chemotherapy so far.   Physical examination unremarkable, he is chronically ill-appearing, no adventitious sounds on lung exam.  No palpable lymphadenopathy or mucositis.  CBC however reveals leukopenia, severe thrombocytopenia not satisfactory to proceed with chemotherapy that was planned for today.  I recommended to delay the chemoimmunotherapy by a week.  We will plan to proceed with chemo next week if labs are satisfactory and then he will proceed with a repeat imaging to evaluate response.  If he continues to have satisfactory response, we will proceed with immunotherapy maintenance after cycle 6. He understands the plan and he agrees with it. We will resume his chemotherapy today and consider a scan in about 4 weeks.

## 2020-11-30 NOTE — Progress Notes (Signed)
[facility] CONSULT NOTE  Patient Care Team: , , MD as PCP - General (Hematology and Oncology) Rosen, Jefry, MD as Consulting Physician (Otolaryngology) Squire, Sarah, MD as Attending Physician (Radiation Oncology) Diehl, Richard A, RN (Inactive) as Registered Nurse (Oncology) Breedlove Blue, Blaire L, PT as Physical Therapist (Physical Therapy) Schinke, Carl B, CCC-SLP as Speech Language Pathologist (Speech Pathology) Neff, Barbara L, RD as Dietitian (Nutrition) Malmfelt, Jennifer L, RN as Oncology Nurse Navigator (Oncology)  CHIEF COMPLAINTS/PURPOSE OF CONSULTATION:   Follow up after recent hospitalization for PE  ASSESSMENT & PLAN:   Malignant neoplasm of base of tongue (HCC) This is a very pleasant 50-year-old male patient with metastatic HPV positive squamous cell carcinoma currently on treatment with 5-fluorouracil, carboplatin and Keytruda, now on Keytruda and carboplatin given severe mucositis with 5-fluorouracil despite dose reduction status post 3 cycles with excellent response on PET scan except for 2 lung nodules which showed some progression recently hospitalized twice for pneumonia and then another hospitalization for pulmonary embolism who is here for follow-up prior after his last chemotherapy.  He has received about 5 cycles of chemotherapy so far.   Physical examination unremarkable, he is chronically ill-appearing, no adventitious sounds on lung exam.  No palpable lymphadenopathy or mucositis.  CBC however reveals leukopenia, severe thrombocytopenia not satisfactory to proceed with chemotherapy that was planned for today.  I recommended to delay the chemoimmunotherapy by a week.  We will plan to proceed with chemo next week if labs are satisfactory and then he will proceed with a repeat imaging to evaluate response.  If he continues to have satisfactory response, we will proceed with immunotherapy maintenance after cycle 6. He understands the plan  and he agrees with it. We will resume his chemotherapy today and consider a scan in about 4 weeks.  Weight loss, unintentional Weight loss, lost about 5 to 6 pounds since last visit.  He has not been eating well since he was tapered off of Megace.  We have discussed about considering restarting Megace since he is already on anticoagulation versus restarting Remeron.  I have only asked them to try 1 or the other.  He is inclined to try Megace and he will try some Tylenol PM for sleep.  We will continue to monitor his weight.  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  We will plan to delay chemotherapy by week. If his labs are satisfactory next week, he can proceed with chemotherapy as planned followed by repeat imaging to reassess response.  No indication for transfusion today.  Orders Placed This Encounter  Procedures   NM PET Image Restag (PS) Skull Base To Thigh    Standing Status:   Future    Standing Expiration Date:   11/30/2021    Order Specific Question:   If indicated for the ordered procedure, I authorize the administration of a radiopharmaceutical per Radiology protocol    Answer:   Yes    Order Specific Question:   Preferred imaging location?    Answer:   [place]     HISTORY OF PRESENTING ILLNESS:   Albert Flowers 50 y.o. male is here because of metastatic HPV positive BOT cancer  Oncology History Overview Note   In summary, patient presents with Dr. Rosen of ENT in late 03/2018 for evaluation of an enlarging right neck mass.  CT neck showed necrotic right Level II and III necrotic LN's, possibly from oropharyngeal oral cavity   primary, but the study was limited due to streak artifacts.  He underwent FNA of the R Level II LN, which showed squamous cell carcinoma, p16+.  PET in 05/2018 showed asymmetric FDG uptake in the right tonsil/base of the tongue, likely the primary malignancy.  In addition,  there were two FDG-avid R Level II LN's without evidence of contralateral cervical LN or metastatic disease.  Given the relatively low volume disease, Dr Zhao recommended upfront surgery, followed by adjuvant therapy based on the final pathology. He had TORS on 06/16/2018.  Pathology from the procedure showed: invasive p16-positive (HPV-related) squamous cell carcinoma, 1.4 cm, negative margins. Out of 24 biopsied lymph nodes, only one was positive for metastatic carcinoma (1/24).  This node was 4.5cm but with no extracapsular extension.  No LVSI and no PNI.  He underwent right neck dissection but not left neck dissection.  At postop follow up, they agreed to proceed with observation and reserve radiation therapy for any possible future recurrence.   At follow up on 04/07/2019, Dr. Waltonen noted a new suspicious left upper neck lymph node. Neck CT performed that day revealed: new heterogeneous 2.2 cm contralateral left level 2a lymph node suspicious for contralateral nodal metastasis; indeterminate mild interval increase in size of several left level 2a and 2b lymph nodes.   PET scan performed on 04/16/2019 showed: single hypermetabolic left cervical lymph node; mild asymmetric FDG uptake in left glossotonsillar sulcus; focal FDG uptake in L1 vertebral body without CT correlate; otherwise, no malignant-range FDG uptake elsewhere.  He underwent left tonsillectomy and left neck dissection on 05/04/2019 with pathology revealing: benign left tonsil. Out of a total of 25 biopsied lymph nodes, only one was positive for metastatic carcinoma but negative for extracapsular extension and positive for p16.  He was advised adjuvant radiation at this time. He completed radiation on 07/24/2019. He was seen for FU by Dr Squire in December when he was doing well. He then started complaining of back pain in February. This led to MRI and PET imaging.  04/26/2020 MRI showed extensive metastatic disease throughout the lumbar  spine as well as the sacrum and left iliac bone. No pathologic fracture or epidural Tumor.  05/04/2020 PET CT showed widespread hypermetabolic bone mets. 3.5 cm necrotic hypermetabolic met lesion in right liver. Hypermetabolic uptake in right hilum associated with 2 hypermetabolic lung nodules. Patchy/nodular ground-glass opacity in the right lung having a tree-in-bud configuration. This would be an atypical appearance for metastatic disease and infectious etiology is favored, potentially Atypical.  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   Head and neck cancer (HCC)  04/24/2018 Imaging   CT neck w/ contrast: 1. Right level 2 necrotic nodal mass and level 3 necrotic lymph node are highly concerning for nodal metastasis from head and neck primary neoplasm likely oropharynx or the oral cavity. However, evaluation for primary neoplasm in these regions is markedly limited due to streak artifacts from dental amalgam and closed airway possibly secondary to patient's holding breath/swallowing. Recommend direct visual inspection and possibly PET CT scan for further evaluation.  2. Mild asymmetry and fullness of the right nasopharyngeal region, nonspecific.  3. 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   05/07/2018 Procedure   FNA of the R IJ Level II LN    05/07/2018 Pathology Results   ACCESSION NUMBER: [id number]  Right IJ chain lymph node, FNA Squamous cell carcinoma,   metastatic; p16+    05/28/2018 Imaging   PET: IMPRESSION: 1. Asymmetric hypermetabolic activity in the RIGHT lingular tonsil/base of tongue region favored primary carcinoma. 2. Two hypermetabolic RIGHT level II metastatic lymph nodes. 3. No LEFT cervical lymphadenopathy.  No distant metastatic disease.   Malignant neoplasm of base of tongue ([place])  04/24/2018 Imaging   CT neck w/ contrast: 1. Right level 2  necrotic nodal mass and level 3 necrotic lymph node are highly concerning for nodal metastasis from head and neck primary neoplasm likely oropharynx or the oral cavity. However, evaluation for primary neoplasm in these regions is markedly limited due to streak artifacts from dental amalgam and closed airway possibly secondary to patient's holding breath/swallowing. Recommend direct visual inspection and possibly PET CT scan for further evaluation.  2. Mild asymmetry and fullness of the right nasopharyngeal region, nonspecific.  3. 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   05/07/2018 Procedure   FNA of the right cervical LN   05/07/2018 Pathology Results   ACCESSION NUMBER: [id number]  Right IJ chain lymph node, FNA Squamous cell carcinoma, metastatic; p16+    05/28/2018 Imaging   PET: IMPRESSION: 1. Asymmetric hypermetabolic activity in the RIGHT lingular tonsil/base of tongue region favored primary carcinoma. 2. Two hypermetabolic RIGHT level II metastatic lymph nodes. 3. No LEFT cervical lymphadenopathy.  No distant metastatic disease.   06/02/2018 Initial Diagnosis   Malignant neoplasm of base of tongue ([place])   06/02/2018 Cancer Staging   Staging form: Pharynx - HPV-Mediated Oropharynx, AJCC 8th Edition - Clinical: Stage I (cT2, cN1, cM0, p16+) - Signed by [name], MD on 06/02/2018   05/29/2019 Cancer Staging   Staging form: Pharynx - HPV-Mediated Oropharynx, AJCC 8th Edition - Pathologic stage from 05/29/2019: Stage I (rpT0, pN1, cM0, p16+) - Signed by [name], MD on 05/29/2019   06/27/2020 - 06/27/2020 Chemotherapy          07/18/2020 -  Chemotherapy    Patient is on Treatment Plan: HEAD/NECK PEMBROLIZUMAB + CARBOPLATIN + 5FU Q21D X 6 CYCLES / PEMBROLIZUMAB Q21D       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  His wife has tapered his Remeron recently and since he stopped it, he could not sleep or eat.  He lost about 5 pounds since last visit.  Wife says most of the days he is pretty tired and laying down in bed.  She has tried some CBD/hemp which has certainly helped his energy on that one being that he took.  No nausea or vomiting or mouth sores.  He occasionally has a sharp left-sided chest pain which very last for seconds, not pressure-like, not aggravated with exertion.  He believes it is the same pain he has had with the pneumonia and he feels as he is recovering. No change in bowel habits No change in urinary habits No new pains. Rest of the pertinent 10 point ROS reviewed and neg  MEDICAL HISTORY:  Past Medical History:  Diagnosis Date   Diabetes mellitus without complication ([place])    Malignant neoplasm of base of tongue ([place]) 06/02/2018    SURGICAL HISTORY: Past Surgical History:  Procedure Laterality Date   IR IMAGING GUIDED PORT INSERTION  07/15/2020   left neck dissection Left 05/04/2019   Left Neck Dissection by Dr. Waltonen at [facility].    neck sugery     as a child "had a knot removed from neck" not sure which side.    RIght neck dissection Right 06/16/2018   TORS and right neck dissection. Dr. Waltonen at [facility]    SOCIAL HISTORY: Social History   Socioeconomic History   Marital status: Married    Spouse name: Not on file   Number of children: 4   Years of education: Not on file   Highest education level: Not on file  Occupational History   Not on file  Tobacco Use   Smoking status: Never   Smokeless tobacco: Never  Vaping Use   Vaping Use: Never used  Substance and Sexual Activity   Alcohol use: Yes    Comment: occasional   Drug use: Never   Sexual activity: Not on file  Other Topics Concern   Not on file  Social History  Narrative   Not on file   Social Determinants of Health   Financial Resource Strain: Not on file  Food Insecurity: Not on file  Transportation Needs: Not on file  Physical Activity: Not on file  Stress: Not on file  Social Connections: Not on file  Intimate Partner Violence: Not on file    FAMILY HISTORY: No family history on file.  ALLERGIES:  is allergic to penicillins.  MEDICATIONS:  Current Outpatient Medications  Medication Sig Dispense Refill   albuterol (VENTOLIN HFA) 108 (90 Base) MCG/ACT inhaler Inhale 2 puffs into the lungs every 6 (six) hours as needed for wheezing or shortness of breath. 8 g 0   apixaban (ELIQUIS) 5 MG TABS tablet Take 1 tablet (5 mg total) by mouth 2 (two) times daily. 60 tablet 0   blood glucose meter kit and supplies KIT Dispense based on patient and insurance preference. Use up to four times daily as directed. 1 each 0   diphenhydrAMINE (BENADRYL) 25 MG tablet Take 1 tablet (25 mg total) by mouth at bedtime. 30 tablet    gabapentin (NEURONTIN) 300 MG capsule Take 900 mg by mouth 2 (two) times daily.     glipiZIDE (GLUCOTROL) 5 MG tablet Take 0.5 tablets (2.5 mg total) by mouth daily before breakfast. 45 tablet 0   Insulin Pen Needle 29G X 5MM MISC For sliding scale insulin injection 100 each 0   lactose free nutrition (BOOST) LIQD Take 237 mLs by mouth 3 (three) times daily between meals.     lidocaine (LIDODERM) 5 % Place 1 patch onto the skin daily. Remove & Discard patch within 12 hours or as directed by MD (Patient taking differently: Place 1 patch onto the skin daily as needed (for pain- Remove & Discard patch within 12 hours or as directed by MD).) 30 patch 0   lidocaine-prilocaine (EMLA) cream Apply to affected area once (Patient taking differently: Apply 1 application topically as directed.) 30 g 3   magic mouthwash SOLN Take 15 mLs by mouth 3 (three) times daily.     metFORMIN (GLUCOPHAGE) 500 MG tablet Take 1 tablet (500 mg total) by mouth 2  (two) times daily with a meal. 180 tablet 0   ondansetron (ZOFRAN ODT) 8 MG disintegrating tablet Take 1 tablet (8 mg total) by mouth every 8 (eight) hours as needed for nausea or vomiting. (Patient taking differently: Take 8 mg by mouth   every 8 (eight) hours as needed for nausea or vomiting (dissolve orally).) 30 tablet 3   oxyCODONE (OXY IR/ROXICODONE) 5 MG immediate release tablet Take 1 tablet (5 mg total) by mouth at bedtime. 60 tablet 0   polyethylene glycol (MIRALAX / GLYCOLAX) 17 g packet Take 17 g by mouth daily.     prochlorperazine (COMPAZINE) 10 MG tablet Take 10 mg by mouth every 6 (six) hours as needed for nausea or vomiting.     senna (SENOKOT) 8.6 MG TABS tablet Take 1 tablet (8.6 mg total) by mouth 2 (two) times daily. 120 tablet 0   No current facility-administered medications for this visit.   Facility-Administered Medications Ordered in Other Visits  Medication Dose Route Frequency Provider Last Rate Last Admin   sodium chloride flush (NS) 0.9 % injection 10 mL  10 mL Intracatheter PRN Bahja Bence, MD   10 mL at 07/22/20 1638    PHYSICAL EXAMINATION: ECOG PERFORMANCE STATUS: 2 - Symptomatic, <50% confined to bed  Vitals:   11/30/20 1346  BP: 103/71  Pulse: 90  Resp: 18  Temp: 98.1 F (36.7 C)  SpO2: 100%    Filed Weights   11/30/20 1346  Weight: 138 lb 8 oz (62.8 kg)   Physical Exam Constitutional:      Appearance: Normal appearance. He is ill-appearing (Chronically ill-appearing).  Cardiovascular:     Rate and Rhythm: Normal rate and regular rhythm.     Pulses: Normal pulses.     Heart sounds: Normal heart sounds.  Pulmonary:     Effort: Pulmonary effort is normal.     Breath sounds: Normal breath sounds.     Comments: No adv sounds today  Abdominal:     General: Abdomen is flat.     Palpations: Abdomen is soft.  Musculoskeletal:        General: No swelling or tenderness.     Cervical back: Normal range of motion and neck supple. No rigidity.   Lymphadenopathy:     Cervical: No cervical adenopathy.  Skin:    General: Skin is warm.     Findings: No rash.  Neurological:     Mental Status: He is alert.     LABORATORY DATA:  I have reviewed the data as listed Lab Results  Component Value Date   WBC 1.7 (L) 11/30/2020   HGB 7.7 (L) 11/30/2020   HCT 22.7 (L) 11/30/2020   MCV 100.0 11/30/2020   PLT 70 (L) 11/30/2020     Chemistry      Component Value Date/Time   NA 138 11/30/2020 1313   K 3.8 11/30/2020 1313   CL 105 11/30/2020 1313   CO2 25 11/30/2020 1313   BUN 7 11/30/2020 1313   CREATININE 0.71 11/30/2020 1313      Component Value Date/Time   CALCIUM 9.3 11/30/2020 1313   ALKPHOS 125 11/30/2020 1313   AST 19 11/30/2020 1313   ALT 11 11/30/2020 1313   BILITOT 0.3 11/30/2020 1313    Labs from today showed a white blood cell count of 1700, hemoglobin of 7.7, platelet count of 70,000. CMP normal.  RADIOGRAPHIC STUDIES: I have personally reviewed the radiological images as listed and agreed with the findings in the report.  No results found.  All questions were answered. The patient knows to call the clinic with any problems, questions or concerns.     Benay Pike, MD 11/30/2020 2:36 PM

## 2020-11-30 NOTE — Telephone Encounter (Signed)
Nutrition Follow-up:  Patient with metastatic BOT cancer. He completed adjuvant radiation therapy 07/24/19 and is s/p palliative radiation to spine in March. Patient currently receiving carboplatin/keytruda  Noted recent hospitalizations: 8/10-8/14 admit for PE 7/17-7/22 admit for HCAP 7/2-7/8 admit for CAP  Completed nutrition follow-up via telephone this afternoon after infusion cancelled today secondary to leukopenia and severe thrombocytopenia. Patient appreciative of call and reports feeling fine, other than poor appetite. Says he knows he needs to be eating better. Patient reports taking Megace during recent hospitalization which he felt worked well for him. He is awaiting refill from pharmacy. Patient feels his appetite will pick up after resuming appetite stimulant. He is drinking 2 Boost most days, unsure of which kind. He denies nausea, vomiting, diarrhea, constipation. Patient reports feeling tired most of the time.    Medications: Eliquis, Metformin, Glipizide, Senokot, Megace, Oxycodone  Labs: Glucose 138  Anthropometrics: Weight 138.5 lb today decreased 4 lbs (2.8%) from 142.5 lb on 8/26. This is insignificant for time frame.   7/18 - 140 lb 3.4 oz 6/27 - 145.5 lb   NUTRITION DIAGNOSIS: Inadequate oral intake ongoing   INTERVENTION:  Encouraged eating smaller meals and snacks with adequate calories and protein Discussed ways to add calories and protein to foods, making the most of every bite - handout with ideas mailed Recommended increasing Boost to 3 times daily and suggested switching to Boost Plus for more calories, coupons mailed  Patient restarting appetite stimulant Encouraged activity as able Patient has contact information    MONITORING, EVALUATION, GOAL: weight trends, intake   NEXT VISIT: Wednesday October 5 during infusion

## 2020-12-01 ENCOUNTER — Other Ambulatory Visit: Payer: Self-pay

## 2020-12-01 LAB — T4: T4, Total: 7.8 ug/dL (ref 4.5–12.0)

## 2020-12-01 MED ORDER — MEGESTROL ACETATE 625 MG/5ML PO SUSP: 625.0000 mg | Freq: Every day | ORAL | 0 refills | Status: DC

## 2020-12-05 ENCOUNTER — Encounter: Payer: Self-pay | Admitting: Hematology and Oncology

## 2020-12-07 ENCOUNTER — Inpatient Hospital Stay: Payer: Managed Care, Other (non HMO)

## 2020-12-07 ENCOUNTER — Other Ambulatory Visit: Payer: Self-pay | Admitting: Hematology and Oncology

## 2020-12-07 ENCOUNTER — Other Ambulatory Visit: Payer: Self-pay

## 2020-12-07 VITALS — BP 121/89 | HR 92 | Temp 98.5°F | Resp 18 | Wt 136.2 lb

## 2020-12-07 DIAGNOSIS — C01 Malignant neoplasm of base of tongue: Secondary | ICD-10-CM

## 2020-12-07 DIAGNOSIS — D6481 Anemia due to antineoplastic chemotherapy: Secondary | ICD-10-CM

## 2020-12-07 DIAGNOSIS — T451X5A Adverse effect of antineoplastic and immunosuppressive drugs, initial encounter: Secondary | ICD-10-CM

## 2020-12-07 DIAGNOSIS — C090 Malignant neoplasm of tonsillar fossa: Secondary | ICD-10-CM

## 2020-12-07 DIAGNOSIS — Z95828 Presence of other vascular implants and grafts: Secondary | ICD-10-CM

## 2020-12-07 LAB — CMP (CANCER CENTER ONLY)
ALT: 8 U/L (ref 0–44)
AST: 17 U/L (ref 15–41)
Albumin: 3.5 g/dL (ref 3.5–5.0)
Alkaline Phosphatase: 116 U/L (ref 38–126)
Anion gap: 10 (ref 5–15)
BUN: 6 mg/dL (ref 6–20)
CO2: 22 mmol/L (ref 22–32)
Calcium: 9.3 mg/dL (ref 8.9–10.3)
Chloride: 105 mmol/L (ref 98–111)
Creatinine: 0.72 mg/dL (ref 0.61–1.24)
GFR, Estimated: >60 mL/min (ref >60)
Glucose, Bld: 202 mg/dL — ABNORMAL HIGH (ref 70–99)
Potassium: 3.4 mmol/L — ABNORMAL LOW (ref 3.5–5.1)
Sodium: 137 mmol/L (ref 135–145)
Total Bilirubin: 0.2 mg/dL — ABNORMAL LOW (ref 0.3–1.2)
Total Protein: 6.7 g/dL (ref 6.5–8.1)

## 2020-12-07 LAB — CBC WITH DIFFERENTIAL/PLATELET
Abs Immature Granulocytes: 0.02 10*3/uL (ref 0.00–0.07)
Basophils Absolute: 0 10*3/uL (ref 0.0–0.1)
Basophils Relative: 0 %
Eosinophils Absolute: 0 10*3/uL (ref 0.0–0.5)
Eosinophils Relative: 1 %
HCT: 21.3 % — ABNORMAL LOW (ref 39.0–52.0)
Hemoglobin: 7.4 g/dL — ABNORMAL LOW (ref 13.0–17.0)
Immature Granulocytes: 1 %
Lymphocytes Relative: 14 %
Lymphs Abs: 0.4 10*3/uL — ABNORMAL LOW (ref 0.7–4.0)
MCH: 34.6 pg — ABNORMAL HIGH (ref 26.0–34.0)
MCHC: 34.7 g/dL (ref 30.0–36.0)
MCV: 99.5 fL (ref 80.0–100.0)
Monocytes Absolute: 0.4 10*3/uL (ref 0.1–1.0)
Monocytes Relative: 13 %
Neutro Abs: 2 10*3/uL (ref 1.7–7.7)
Neutrophils Relative %: 71 %
Platelets: 56 10*3/uL — ABNORMAL LOW (ref 150–400)
RBC: 2.14 MIL/uL — ABNORMAL LOW (ref 4.22–5.81)
RDW: 18.4 % — ABNORMAL HIGH (ref 11.5–15.5)
WBC: 2.7 10*3/uL — ABNORMAL LOW (ref 4.0–10.5)
nRBC: 0 % (ref 0.0–0.2)

## 2020-12-07 LAB — SAMPLE TO BLOOD BANK

## 2020-12-07 LAB — TSH: TSH: 1.487 u[IU]/mL (ref 0.320–4.118)

## 2020-12-07 MED ORDER — HEPARIN SOD (PORK) LOCK FLUSH 100 UNIT/ML IV SOLN
500.0000 [IU] | Freq: Once | INTRAVENOUS | Status: AC | PRN
  Administered 2020-12-07: 500 [IU]

## 2020-12-07 MED ORDER — SODIUM CHLORIDE 0.9% FLUSH
10.0000 mL | INTRAVENOUS | Status: DC | PRN
  Administered 2020-12-07: 10 mL

## 2020-12-07 MED ORDER — SODIUM CHLORIDE 0.9% FLUSH
10.0000 mL | Freq: Once | INTRAVENOUS | Status: AC
  Administered 2020-12-07: 10 mL

## 2020-12-07 MED ORDER — SODIUM CHLORIDE 0.9 % IV SOLN: Freq: Once | INTRAVENOUS | Status: AC

## 2020-12-07 MED ORDER — SODIUM CHLORIDE 0.9 % IV SOLN
200.0000 mg | Freq: Once | INTRAVENOUS | Status: AC
  Administered 2020-12-07: 200 mg via INTRAVENOUS
  Filled 2020-12-07: qty 8

## 2020-12-07 NOTE — Progress Notes (Signed)
Per Dr. Chryl Heck, ok to proceed with [facility] with low hgb and platelets. No carboplatin today.

## 2020-12-07 NOTE — Progress Notes (Signed)
Will proceed with Keytruda alone today if CMP is within acceptable parameters. Carboplatin cannot be administered given severe thrombocytopenia.

## 2020-12-07 NOTE — Patient Instructions (Signed)
[facility] MEDICAL ONCOLOGY  Discharge Instructions: Thank you for choosing [facility] to provide your oncology and hematology care.   If you have a lab appointment with the Cancer Center, please go directly to the Cancer Center and check in at the registration area.   Wear comfortable clothing and clothing appropriate for easy access to any Portacath or PICC line.   We strive to give you quality time with your provider. You may need to reschedule your appointment if you arrive late (15 or more minutes).  Arriving late affects you and other patients whose appointments are after yours.  Also, if you miss three or more appointments without notifying the office, you may be dismissed from the clinic at the provider's discretion.      For prescription refill requests, have your pharmacy contact our office and allow 72 hours for refills to be completed.    Today you received the following chemotherapy and/or immunotherapy agents: keytruda      To help prevent nausea and vomiting after your treatment, we encourage you to take your nausea medication as directed.  BELOW ARE SYMPTOMS THAT SHOULD BE REPORTED IMMEDIATELY: *FEVER GREATER THAN 100.4 F (38 C) OR HIGHER *CHILLS OR SWEATING *NAUSEA AND VOMITING THAT IS NOT CONTROLLED WITH YOUR NAUSEA MEDICATION *UNUSUAL SHORTNESS OF BREATH *UNUSUAL BRUISING OR BLEEDING *URINARY PROBLEMS (pain or burning when urinating, or frequent urination) *BOWEL PROBLEMS (unusual diarrhea, constipation, pain near the anus) TENDERNESS IN MOUTH AND THROAT WITH OR WITHOUT PRESENCE OF ULCERS (sore throat, sores in mouth, or a toothache) UNUSUAL RASH, SWELLING OR PAIN  UNUSUAL VAGINAL DISCHARGE OR ITCHING   Items with * indicate a potential emergency and should be followed up as soon as possible or go to the Emergency Department if any problems should occur.  Please show the CHEMOTHERAPY ALERT CARD or IMMUNOTHERAPY ALERT CARD at check-in to  the Emergency Department and triage nurse.  Should you have questions after your visit or need to cancel or reschedule your appointment, please contact [facility]  Dept: [phone]  and follow the prompts.  Office hours are 8:00 a.m. to 4:30 p.m. Monday - Friday. Please note that voicemails left after 4:00 p.m. may not be returned until the following business day.  We are closed weekends and major holidays. You have access to a nurse at all times for urgent questions. Please call the main number to the clinic Dept: [phone] and follow the prompts.   For any non-urgent questions, you may also contact your provider using MyChart. We now offer e-Visits for anyone 18 and older to request care online for non-urgent symptoms. For details visit [email or web address].   Also download the MyChart app! Go to the app store, search "MyChart", open the app, select , and log in with your MyChart username and password.  Due to Covid, a mask is required upon entering the hospital/clinic. If you do not have a mask, one will be given to you upon arrival. For doctor visits, patients may have 1 support person aged 18 or older with them. For treatment visits, patients cannot have anyone with them due to current Covid guidelines and our immunocompromised population.   

## 2020-12-08 LAB — T4: T4, Total: 7.2 ug/dL (ref 4.5–12.0)

## 2020-12-08 LAB — PREPARE RBC (CROSSMATCH)

## 2020-12-09 ENCOUNTER — Other Ambulatory Visit: Payer: Self-pay | Admitting: Hematology and Oncology

## 2020-12-10 ENCOUNTER — Inpatient Hospital Stay: Payer: Managed Care, Other (non HMO)

## 2020-12-10 DIAGNOSIS — C01 Malignant neoplasm of base of tongue: Secondary | ICD-10-CM | POA: Diagnosis not present

## 2020-12-10 MED ORDER — SODIUM CHLORIDE 0.9% IV SOLUTION
250.0000 mL | Freq: Once | INTRAVENOUS | Status: AC
  Administered 2020-12-10: 250 mL via INTRAVENOUS

## 2020-12-10 NOTE — Patient Instructions (Signed)
Blood Transfusion, Adult, Care After This sheet gives you information about how to care for yourself after your procedure. Your doctor may also give you more specific instructions. If you have problems or questions, contact your doctor. What can I expect after the procedure? After the procedure, it is common to have: Bruising and soreness at the IV site. A headache. Follow these instructions at home: Insertion site care   Follow instructions from your doctor about how to take care of your insertion site. This is where an IV tube was put into your vein. Make sure you: Wash your hands with soap and water before and after you change your bandage (dressing). If you cannot use soap and water, use hand sanitizer. Change your bandage as told by your doctor. Check your insertion site every day for signs of infection. Check for: Redness, swelling, or pain. Bleeding from the site. Warmth. Pus or a bad smell. General instructions Take over-the-counter and prescription medicines only as told by your doctor. Rest as told by your doctor. Go back to your normal activities as told by your doctor. Keep all follow-up visits as told by your doctor. This is important. Contact a doctor if: You have itching or red, swollen areas of skin (hives). You feel worried or nervous (anxious). You feel weak after doing your normal activities. You have redness, swelling, warmth, or pain around the insertion site. You have blood coming from the insertion site, and the blood does not stop with pressure. You have pus or a bad smell coming from the insertion site. Get help right away if: You have signs of a serious reaction. This may be coming from an allergy or the body's defense system (immune system). Signs include: Trouble breathing or shortness of breath. Swelling of the face or feeling warm (flushed). Fever or chills. Head, chest, or back pain. Dark pee (urine) or blood in the pee. Widespread rash. Fast  heartbeat. Feeling dizzy or light-headed. You may receive your blood transfusion in an outpatient setting. If so, you will be told whom to contact to report any reactions. These symptoms may be an emergency. Do not wait to see if the symptoms will go away. Get medical help right away. Call your local emergency services (911 in the [place] Do not drive yourself to the hospital. Summary Bruising and soreness at the IV site are common. Check your insertion site every day for signs of infection. Rest as told by your doctor. Go back to your normal activities as told by your doctor. Get help right away if you have signs of a serious reaction. This information is not intended to replace advice given to you by your health care provider. Make sure you discuss any questions you have with your health care provider. Document Revised: 06/30/2020 Document Reviewed: 08/28/2018 Elsevier Patient Education  2022 Elsevier Inc.  

## 2020-12-12 LAB — TYPE AND SCREEN
ABO/RH(D): O POS
Antibody Screen: NEGATIVE
Unit division: 0
Unit division: 0

## 2020-12-12 LAB — BPAM RBC
Blood Product Expiration Date: 202210242359
Blood Product Expiration Date: 202210252359
ISSUE DATE / TIME: 202209240808
ISSUE DATE / TIME: 202209240808
Unit Type and Rh: 5100
Unit Type and Rh: 5100

## 2020-12-14 ENCOUNTER — Other Ambulatory Visit: Payer: Self-pay | Admitting: Hematology and Oncology

## 2020-12-15 ENCOUNTER — Encounter: Payer: Self-pay | Admitting: Hematology and Oncology

## 2020-12-20 ENCOUNTER — Other Ambulatory Visit: Payer: Self-pay

## 2020-12-20 ENCOUNTER — Inpatient Hospital Stay: Payer: Managed Care, Other (non HMO) | Attending: Hematology and Oncology | Admitting: Hematology and Oncology

## 2020-12-20 ENCOUNTER — Encounter: Payer: Self-pay | Admitting: Hematology and Oncology

## 2020-12-20 DIAGNOSIS — Z79899 Other long term (current) drug therapy: Secondary | ICD-10-CM | POA: Insufficient documentation

## 2020-12-20 DIAGNOSIS — C7951 Secondary malignant neoplasm of bone: Secondary | ICD-10-CM | POA: Insufficient documentation

## 2020-12-20 DIAGNOSIS — Z8701 Personal history of pneumonia (recurrent): Secondary | ICD-10-CM | POA: Diagnosis not present

## 2020-12-20 DIAGNOSIS — R0781 Pleurodynia: Secondary | ICD-10-CM | POA: Diagnosis not present

## 2020-12-20 DIAGNOSIS — Z7901 Long term (current) use of anticoagulants: Secondary | ICD-10-CM | POA: Insufficient documentation

## 2020-12-20 DIAGNOSIS — E119 Type 2 diabetes mellitus without complications: Secondary | ICD-10-CM | POA: Insufficient documentation

## 2020-12-20 DIAGNOSIS — Z9221 Personal history of antineoplastic chemotherapy: Secondary | ICD-10-CM | POA: Diagnosis not present

## 2020-12-20 DIAGNOSIS — C7801 Secondary malignant neoplasm of right lung: Secondary | ICD-10-CM | POA: Insufficient documentation

## 2020-12-20 DIAGNOSIS — C787 Secondary malignant neoplasm of liver and intrahepatic bile duct: Secondary | ICD-10-CM | POA: Insufficient documentation

## 2020-12-20 DIAGNOSIS — C090 Malignant neoplasm of tonsillar fossa: Secondary | ICD-10-CM

## 2020-12-20 DIAGNOSIS — G893 Neoplasm related pain (acute) (chronic): Secondary | ICD-10-CM | POA: Insufficient documentation

## 2020-12-20 DIAGNOSIS — C01 Malignant neoplasm of base of tongue: Secondary | ICD-10-CM

## 2020-12-20 DIAGNOSIS — C7802 Secondary malignant neoplasm of left lung: Secondary | ICD-10-CM | POA: Diagnosis not present

## 2020-12-20 DIAGNOSIS — R630 Anorexia: Secondary | ICD-10-CM | POA: Diagnosis not present

## 2020-12-20 DIAGNOSIS — Z794 Long term (current) use of insulin: Secondary | ICD-10-CM | POA: Insufficient documentation

## 2020-12-20 DIAGNOSIS — Z86718 Personal history of other venous thrombosis and embolism: Secondary | ICD-10-CM | POA: Insufficient documentation

## 2020-12-20 MED ORDER — LIDOCAINE-PRILOCAINE 2.5-2.5 % EX CREA: 1.0000 "application " | TOPICAL_CREAM | CUTANEOUS | Status: DC

## 2020-12-20 NOTE — Assessment & Plan Note (Signed)
He has not needed much pain medication since his last visit.  He takes occasional oxycodone.

## 2020-12-20 NOTE — Progress Notes (Signed)
[facility] CONSULT NOTE  Patient Care Team: Benay Pike, MD as PCP - General (Hematology and Oncology) [name], MD as Consulting Physician (Otolaryngology) [name], MD as Attending Physician (Radiation Oncology) Leota Sauers, RN (Inactive) as Registered Nurse (Oncology) Wynelle Beckmann, Melodie Bouillon, PT as Physical Therapist (Physical Therapy) Sharen Counter, CCC-SLP as Speech Language Pathologist (Speech Pathology) Karie Mainland, RD as Dietitian (Nutrition) Malmfelt, Stephani Police, RN as Oncology Nurse Navigator (Oncology)  CHIEF COMPLAINTS/PURPOSE OF CONSULTATION:   Follow up after recent hospitalization for PE  ASSESSMENT & PLAN:   Malignant neoplasm of base of tongue ([place]) This is a very pleasant 50 year old male patient with metastatic HPV positive squamous cell carcinoma currently on treatment with 5-fluorouracil, carboplatin and Keytruda,transitioned to [facility] and carboplatin given severe mucositis with 5-fluorouracil despite dose reduction status post 3 cycles with excellent response on PET scan except for 2 lung nodules which showed some progression recently hospitalized twice for pneumonia and then another hospitalization for pulmonary embolism who is here for follow-up prior after his last chemotherapy.  He is now on immunotherapy maintenance.  She is here for an interim visit given some extreme headaches however these have resolved about 2 or 3 days ago.  He denies any other neurological deficit.  He continues to feel very tired otherwise reports no new bone pains.  Appetite is improved with Megace.  He has some intermittent chest pain since his pulmonary embolism which has not increased in intensity.  He is compliant with his blood thinners. He also takes Remeron for sleep aid. Physical examination today without any major concerns.  He has a PET/CT due tomorrow to assess disease response.  We will monitor his PET/CT results and plan accordingly.  He  is currently due for his Beryle Flock on December 28, 2020. His wife has also contacted [name] where he was previously seen for consideration of a clinical trial in case he progresses.   Anorexia Improving, weight has improved by couple pounds.  Continue Megace as prescribed.  Rib pain on left side This continues to be an intermittent issue but has not progressed since his last visit.  We will continue to monitor this.  Cancer related pain He has not needed much pain medication since his last visit.  He takes occasional oxycodone.  No orders of the defined types were placed in this encounter.    HISTORY OF PRESENTING ILLNESS:   Albert Flowers 50 y.o. male is here because of metastatic HPV positive BOT cancer  Oncology History Overview Note   In summary, patient presents with Dr. Constance Holster of ENT in late 03/2018 for evaluation of an enlarging right neck mass.  CT neck showed necrotic right Level II and III necrotic LN's, possibly from oropharyngeal oral cavity primary, but the study was limited due to streak artifacts.  He underwent FNA of the R Level II LN, which showed squamous cell carcinoma, p16+.  PET in 05/2018 showed asymmetric FDG uptake in the right tonsil/base of the tongue, likely the primary malignancy.  In addition, there were two FDG-avid R Level II LN's without evidence of contralateral cervical LN or metastatic disease.  Given the relatively low volume disease, Dr Maylon Peppers recommended upfront surgery, followed by adjuvant therapy based on the final pathology. He had TORS on 06/16/2018.  Pathology from the procedure showed: invasive p16-positive (HPV-related) squamous cell carcinoma, 1.4 cm, negative margins. Out of 24 biopsied lymph nodes, only one was positive for metastatic carcinoma (1/24).  This node was  4.5cm but with no extracapsular extension.  No LVSI and no PNI.  He underwent right neck dissection but not left neck dissection.  At postop follow up, they agreed to proceed  with observation and reserve radiation therapy for any possible future recurrence.   At follow up on 04/07/2019, Dr. [name] noted a new suspicious left upper neck lymph node. Neck CT performed that day revealed: new heterogeneous 2.2 cm contralateral left level 2a lymph node suspicious for contralateral nodal metastasis; indeterminate mild interval increase in size of several left level 2a and 2b lymph nodes.   PET scan performed on 04/16/2019 showed: single hypermetabolic left cervical lymph node; mild asymmetric FDG uptake in left glossotonsillar sulcus; focal FDG uptake in L1 vertebral body without CT correlate; otherwise, no malignant-range FDG uptake elsewhere.  He underwent left tonsillectomy and left neck dissection on 05/04/2019 with pathology revealing: benign left tonsil. Out of a total of 25 biopsied lymph nodes, only one was positive for metastatic carcinoma but negative for extracapsular extension and positive for p16.  He was advised adjuvant radiation at this time. He completed radiation on 07/24/2019. He was seen for FU by Dr Isidore Moos in December when he was doing well. He then started complaining of back pain in February. This led to MRI and PET imaging.  04/26/2020 MRI showed extensive metastatic disease throughout the lumbar spine as well as the sacrum and left iliac bone. No pathologic fracture or epidural Tumor.  05/04/2020 PET CT showed widespread hypermetabolic bone mets. 3.5 cm necrotic hypermetabolic met lesion in right liver. Hypermetabolic uptake in right hilum associated with 2 hypermetabolic lung nodules. Patchy/nodular ground-glass opacity in the right lung having a tree-in-bud configuration. This would be an atypical appearance for metastatic disease and infectious etiology is favored, potentially Atypical.  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 Right level 2 necrotic nodal mass and level 3 necrotic lymph node are highly concerning for nodal metastasis from head and neck primary neoplasm likely oropharynx or the oral cavity. However, evaluation for primary neoplasm in these regions is markedly limited due to streak artifacts from dental amalgam and closed airway possibly secondary to patient's holding breath/swallowing. Recommend direct visual inspection and possibly PET CT scan for further evaluation.  2. Mild asymmetry and fullness of the right nasopharyngeal region, nonspecific.  3. 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   05/07/2018 Procedure   FNA of the R IJ Level II LN    05/07/2018 Pathology Results   ACCESSION NUMBER: [id number]  Right IJ chain lymph node, FNA Squamous cell carcinoma, metastatic; p16+    05/28/2018 Imaging   PET: IMPRESSION: 1. Asymmetric hypermetabolic activity in the RIGHT lingular tonsil/base of tongue region favored primary carcinoma. 2. Two hypermetabolic RIGHT level II metastatic lymph nodes. 3. No LEFT cervical lymphadenopathy.  No distant metastatic disease.   Malignant neoplasm of base of tongue ([place])  04/24/2018 Imaging   CT neck w/ contrast: 1. Right level 2 necrotic nodal mass and level 3 necrotic lymph node are highly concerning for nodal metastasis from head and neck primary neoplasm likely oropharynx or the oral cavity. However, evaluation for primary neoplasm in these regions is markedly limited due to streak artifacts from dental amalgam and closed airway possibly secondary to patient's holding breath/swallowing. Recommend direct visual inspection  and possibly PET CT scan for further evaluation.  2. Mild asymmetry and fullness of the right nasopharyngeal region, nonspecific.  3. 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   05/07/2018 Procedure   FNA of the right cervical LN   05/07/2018 Pathology  Results   ACCESSION NUMBER: [id number]  Right IJ chain lymph node, FNA Squamous cell carcinoma, metastatic; p16+    05/28/2018 Imaging   PET: IMPRESSION: 1. Asymmetric hypermetabolic activity in the RIGHT lingular tonsil/base of tongue region favored primary carcinoma. 2. Two hypermetabolic RIGHT level II metastatic lymph nodes. 3. No LEFT cervical lymphadenopathy.  No distant metastatic disease.   06/02/2018 Initial Diagnosis   Malignant neoplasm of base of tongue ([place])   06/02/2018 Cancer Staging   Staging form: Pharynx - HPV-Mediated Oropharynx, AJCC 8th Edition - Clinical: Stage I (cT2, cN1, cM0, p16+) - Signed by [name], MD on 06/02/2018   05/29/2019 Cancer Staging   Staging form: Pharynx - HPV-Mediated Oropharynx, AJCC 8th Edition - Pathologic stage from 05/29/2019: Stage I (rpT0, pN1, cM0, p16+) - Signed by [name], MD on 05/29/2019   06/27/2020 - 06/27/2020 Chemotherapy          07/18/2020 -  Chemotherapy    Patient is on Treatment Plan: HEAD/NECK PEMBROLIZUMAB + CARBOPLATIN + 5FU Q21D X 6 CYCLES / PEMBROLIZUMAB Q21D       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  He has started Megace about a week or so ago and has noticed an improved appetite.  He has also started taking Remeron in the past 2 days to help with sleep.  He initially called with a headache which has been quite extreme on the right side of the head and hence the interim visit was scheduled.  Today he says that the headache has resolved about 2 or 3 days ago.  No other neurological deficit.  He does remember feeling stuffed in his ear prior to the headache.  He denies any new bone pains.  Fatigue is the most bothersome symptom  for him today.  He will be getting his Beryle Flock as scheduled on December 28, 2020.  He also has a PET/CT scheduled for tomorrow. No new pains. Rest of the pertinent 10 point ROS reviewed and neg  MEDICAL HISTORY:  Past Medical History:  Diagnosis Date   Diabetes mellitus without complication ([place])    Malignant neoplasm of base of tongue ([place]) 06/02/2018    SURGICAL HISTORY: Past Surgical History:  Procedure Laterality Date   IR IMAGING GUIDED PORT INSERTION  07/15/2020   left neck dissection Left 05/04/2019   Left Neck Dissection by Dr. [name] at [facility].    neck sugery     as a child "had a knot removed from neck" not sure which side.    RIght neck dissection Right 06/16/2018   TORS and right neck dissection. Dr. [name] at [facility]    SOCIAL HISTORY: Social History   Socioeconomic History   Marital status: Married    Spouse name: Not on file   Number of children: 4   Years of education: Not on file   Highest education level: Not on file  Occupational History   Not on file  Tobacco Use   Smoking status: Never   Smokeless tobacco: Never  Vaping Use   Vaping Use: Never used  Substance and Sexual Activity   Alcohol use: Yes    Comment: occasional   Drug use: Never   Sexual activity: Not on file  Other Topics Concern   Not on file  Social History Narrative   Not on file   Social Determinants of Health   Financial Resource Strain: Not on file  Food Insecurity: Not on file  Transportation Needs: Not on file  Physical Activity: Not on file  Stress: Not on file  Social Connections: Not on file  Intimate Partner Violence: Not on file    FAMILY HISTORY: No family history on file.  ALLERGIES:  is allergic to penicillins.  MEDICATIONS:  Current Outpatient Medications  Medication Sig Dispense Refill   albuterol (VENTOLIN HFA) 108 (90 Base) MCG/ACT inhaler Inhale 2 puffs into the lungs every 6 (six) hours as needed for wheezing or shortness of breath. 8 g 0   blood  glucose meter kit and supplies KIT Dispense based on patient and insurance preference. Use up to four times daily as directed. 1 each 0   diphenhydrAMINE (BENADRYL) 25 MG tablet Take 1 tablet (25 mg total) by mouth at bedtime. 30 tablet    ELIQUIS 5 MG TABS tablet TAKE 1 TABLET BY MOUTH TWICE A DAY 60 tablet 0   gabapentin (NEURONTIN) 300 MG capsule Take 900 mg by mouth 2 (two) times daily.     glipiZIDE (GLUCOTROL) 5 MG tablet Take 0.5 tablets (2.5 mg total) by mouth daily before breakfast. 45 tablet 0   Insulin Pen Needle 29G X 5MM MISC For sliding scale insulin injection 100 each 0   lactose free nutrition (BOOST) LIQD Take 237 mLs by mouth 3 (three) times daily between meals.     lidocaine (LIDODERM) 5 % Place 1 patch onto the skin daily. Remove & Discard patch within 12 hours or as directed by MD (Patient taking differently: Place 1 patch onto the skin daily as needed (for pain- Remove & Discard patch within 12 hours or as directed by MD).) 30 patch 0   lidocaine-prilocaine (EMLA) cream Apply 1 application topically as directed. For port-a-cath access 30 g G   magic mouthwash SOLN Take 15 mLs by mouth 3 (three) times daily.     megestrol (MEGACE ES) 625 MG/5ML suspension Take 5 mLs (625 mg total) by mouth daily. 150 mL 0   metFORMIN (GLUCOPHAGE) 500 MG tablet Take 1 tablet (500 mg total) by mouth 2 (two) times daily with a meal. 180 tablet 0   mirtazapine (REMERON) 15 MG tablet TAKE 2 TABLETS BY MOUTH AT BEDTIME 60 tablet 0   ondansetron (ZOFRAN ODT) 8 MG disintegrating tablet Take 1 tablet (8 mg total) by mouth every 8 (eight) hours as needed for nausea or vomiting. (Patient taking differently: Take 8 mg by mouth every 8 (eight) hours as needed for nausea or vomiting (dissolve orally).) 30 tablet 3   oxyCODONE (OXY IR/ROXICODONE) 5 MG immediate release tablet Take 1 tablet (5 mg total) by mouth at bedtime. 60 tablet 0   polyethylene glycol (MIRALAX / GLYCOLAX) 17 g packet Take 17 g by mouth  daily.     prochlorperazine (COMPAZINE) 10 MG tablet Take 10 mg by mouth every 6 (six) hours as needed for nausea or vomiting.     senna (SENOKOT) 8.6 MG TABS tablet Take 1  tablet (8.6 mg total) by mouth 2 (two) times daily. 120 tablet 0   No current facility-administered medications for this visit.   Facility-Administered Medications Ordered in Other Visits  Medication Dose Route Frequency Provider Last Rate Last Admin   sodium chloride flush (NS) 0.9 % injection 10 mL  10 mL Intracatheter PRN Marylouise Mallet, MD   10 mL at 07/22/20 1638    PHYSICAL EXAMINATION: ECOG PERFORMANCE STATUS: 2 - Symptomatic, <50% confined to bed  Vitals:   12/20/20 1322  BP: 111/84  Pulse: (!) 102  Resp: 16  Temp: 98.3 F (36.8 C)  SpO2: 99%    Filed Weights   12/20/20 1322  Weight: 138 lb 12.8 oz (63 kg)   Physical Exam Constitutional:      Appearance: Normal appearance. He is ill-appearing (Chronically ill-appearing).  Cardiovascular:     Rate and Rhythm: Normal rate and regular rhythm.     Pulses: Normal pulses.     Heart sounds: Normal heart sounds.  Pulmonary:     Effort: Pulmonary effort is normal.     Breath sounds: Normal breath sounds.     Comments: No adv sounds today  Abdominal:     General: Abdomen is flat.     Palpations: Abdomen is soft.  Musculoskeletal:        General: No swelling or tenderness.     Cervical back: Normal range of motion and neck supple. No rigidity.  Lymphadenopathy:     Cervical: No cervical adenopathy.  Skin:    General: Skin is warm.     Findings: No rash.  Neurological:     Mental Status: He is alert.     LABORATORY DATA:  I have reviewed the data as listed Lab Results  Component Value Date   WBC 2.7 (L) 12/07/2020   HGB 7.4 (L) 12/07/2020   HCT 21.3 (L) 12/07/2020   MCV 99.5 12/07/2020   PLT 56 (L) 12/07/2020     Chemistry      Component Value Date/Time   NA 137 12/07/2020 1316   K 3.4 (L) 12/07/2020 1316   CL 105 12/07/2020 1316    CO2 22 12/07/2020 1316   BUN 6 12/07/2020 1316   CREATININE 0.72 12/07/2020 1316      Component Value Date/Time   CALCIUM 9.3 12/07/2020 1316   ALKPHOS 116 12/07/2020 1316   AST 17 12/07/2020 1316   ALT 8 12/07/2020 1316   BILITOT 0.2 (L) 12/07/2020 1316      RADIOGRAPHIC STUDIES: I have personally reviewed the radiological images as listed and agreed with the findings in the report.  No results found.  All questions were answered. The patient knows to call the clinic with any problems, questions or concerns. I spent 30 minutes in the care of this patient including history and physical review of records counseling and coordination of care.    Benay Pike, MD 12/20/2020 3:17 PM

## 2020-12-20 NOTE — Assessment & Plan Note (Signed)
This continues to be an intermittent issue but has not progressed since his last visit.  We will continue to monitor this.

## 2020-12-20 NOTE — Assessment & Plan Note (Signed)
Improving, weight has improved by couple pounds.  Continue Megace as prescribed.

## 2020-12-20 NOTE — Assessment & Plan Note (Signed)
This is a very pleasant 50 year old male patient with metastatic HPV positive squamous cell carcinoma currently on treatment with 5-fluorouracil, carboplatin and Keytruda,transitioned to [facility] and carboplatin given severe mucositis with 5-fluorouracil despite dose reduction status post 3 cycles with excellent response on PET scan except for 2 lung nodules which showed some progression recently hospitalized twice for pneumonia and then another hospitalization for pulmonary embolism who is here for follow-up prior after his last chemotherapy.  He is now on immunotherapy maintenance.  She is here for an interim visit given some extreme headaches however these have resolved about 2 or 3 days ago.  He denies any other neurological deficit.  He continues to feel very tired otherwise reports no new bone pains.  Appetite is improved with Megace.  He has some intermittent chest pain since his pulmonary embolism which has not increased in intensity.  He is compliant with his blood thinners. He also takes Remeron for sleep aid. Physical examination today without any major concerns.  He has a PET/CT due tomorrow to assess disease response.  We will monitor his PET/CT results and plan accordingly.  He is currently due for his Beryle Flock on December 28, 2020. His wife has also contacted [name] where he was previously seen for consideration of a clinical trial in case he progresses.

## 2020-12-21 ENCOUNTER — Ambulatory Visit: Payer: Managed Care, Other (non HMO)

## 2020-12-21 ENCOUNTER — Encounter (HOSPITAL_COMMUNITY)
Admission: RE | Admit: 2020-12-21 | Discharge: 2020-12-21 | Disposition: A | Discharge status: Home / Self Care | Payer: Managed Care, Other (non HMO) | Source: Ambulatory Visit | Attending: Hematology and Oncology | Admitting: Hematology and Oncology

## 2020-12-21 ENCOUNTER — Other Ambulatory Visit: Payer: Managed Care, Other (non HMO)

## 2020-12-21 ENCOUNTER — Encounter: Payer: Managed Care, Other (non HMO) | Admitting: Dietician

## 2020-12-21 ENCOUNTER — Ambulatory Visit: Payer: Managed Care, Other (non HMO) | Admitting: Hematology and Oncology

## 2020-12-21 DIAGNOSIS — C01 Malignant neoplasm of base of tongue: Secondary | ICD-10-CM | POA: Diagnosis present

## 2020-12-21 LAB — GLUCOSE, CAPILLARY: Glucose-Capillary: 85 mg/dL (ref 70–99)

## 2020-12-21 IMAGING — PT NM PET TUM IMG RESTAG (PS) SKULL BASE T - THIGH
1 of 8 series · 2 of 25 positions shown · non-contrast
Comparison: PET-CT [DATE]

CLINICAL DATA: Subsequent treatment strategy for head neck
carcinoma.

EXAM:
NUCLEAR MEDICINE PET SKULL BASE TO THIGH
TECHNIQUE: 6.8 mCi F-18 FDG was injected intravenously. Full-ring PET imaging
was performed from the skull base to thigh after the radiotracer. CT
data was obtained and used for attenuation correction and anatomic
localization.
Fasting blood glucose: 85 mg/dl

[Series 4: ct hn_sk_th 5.0 hd_fov · axial · 5.0mm · 1.07mm/px · z∈[+241,+477]mm · 2 of 237 slices shown]
[im 178/237  brain]
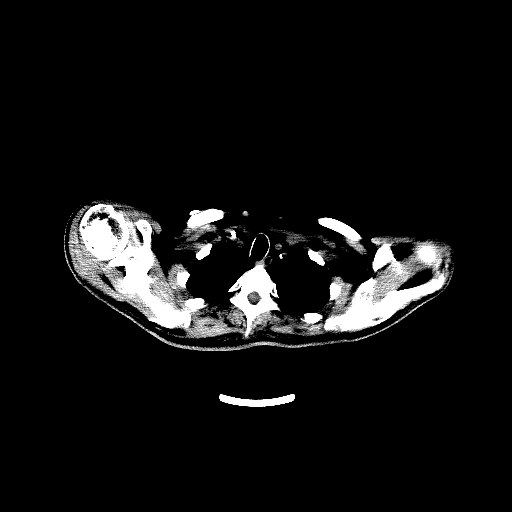
[im 237/237  brain]
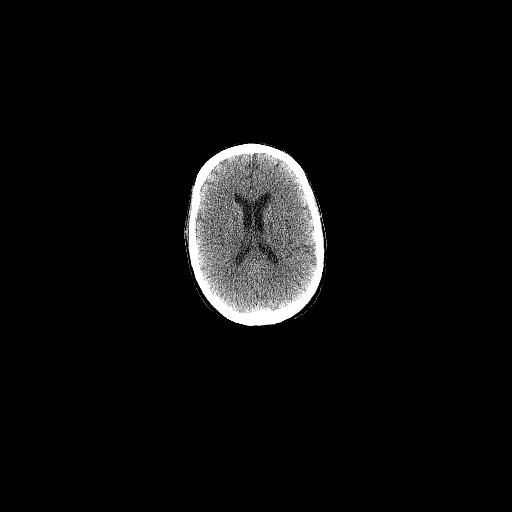

[2 of 25 positions shown; findings below may reference images not displayed]

FINDINGS: Mediastinal blood pool activity: SUV max

Liver activity: SUV max NA

NECK: No hypermetabolic lymph nodes in the neck.

Incidental CT findings: Port in the anterior chest wall with tip in
distal SVC.

CHEST: Dominant nodule in the RIGHT middle lobe measures 21 mm
increased from 11 mm (image 93/4) has intense metabolic activity
with SUV max equal 17.6 increased from 5.9.

Hypermetabolic nodule in the LEFT upper lobe measuring 8 mm (image
78) with SUV max equal 5.4 is new from prior.

New LEFT hilar lesion measuring 17 mm (image 78/4) has intense
metabolic activity SUV max equal 10.8.

Incidental CT findings: none

ABDOMEN/PELVIS: New small hypermetabolic lesion in the RIGHT hepatic
lobe with SUV max equal 6.2 on image 108. There is a subtle
hypodensity on the CT noncontrast exam measuring 10 mm.

Second lesion along the inferior margin of the RIGHT hepatic lobe
measuring 20 mm (126/4) has intense metabolic activity with SUV max
equal 11.9. This is increased from a subtle lesion with SUV max
equal 3.7 on comparison exam.

Diffuse activity associated the colon is favored benign.

Incidental CT findings: none

SKELETON: There multiple foci of hypermetabolic skeletal metastasis.

Interval soft tissue expansion of metastatic lesion anterior RIGHT
rib SUV max equal 12.6 increased from SUV max equal 4.8. This
expansile soft tissue component measures 20 mm in depth compared to
10 mm on prior.

Increase in activity of a RIGHT humeral head lesion with SUV max
equal 12.0 increased from SUV max equal 4.4. There is increased in
bone sclerosis at this level.

New lesion in the RIGHT iliac wing with SUV max equal 12.6. This is
a broad lesion with sclerosis and cortical disruption. Similar
lesion in the LEFT iliac wing.

Incidental CT findings: none
IMPRESSION: 1. Considerable progression multi organ hypermetabolic metastasis.
2. Interval increase in size and new RIGHT lung pulmonary metastasis
and LEFT hilar metastasis.
3. New and increased RIGHT hepatic lobe metastasis.
4. New and increased skeletal metastasis with soft tissue expansion
of several lesions.

## 2020-12-21 MED ORDER — FLUDEOXYGLUCOSE F - 18 (FDG) INJECTION: 6.9000 | Freq: Once | INTRAVENOUS | Status: DC

## 2020-12-23 ENCOUNTER — Encounter: Payer: Self-pay | Admitting: Hematology and Oncology

## 2020-12-26 ENCOUNTER — Encounter: Payer: Self-pay | Admitting: Hematology and Oncology

## 2020-12-27 ENCOUNTER — Encounter: Payer: Self-pay | Admitting: Hematology and Oncology

## 2020-12-28 ENCOUNTER — Encounter: Payer: Self-pay | Admitting: Hematology and Oncology

## 2020-12-28 ENCOUNTER — Inpatient Hospital Stay: Payer: Managed Care, Other (non HMO)

## 2020-12-28 ENCOUNTER — Inpatient Hospital Stay (HOSPITAL_BASED_OUTPATIENT_CLINIC_OR_DEPARTMENT_OTHER): Payer: Managed Care, Other (non HMO) | Admitting: Hematology and Oncology

## 2020-12-28 ENCOUNTER — Other Ambulatory Visit: Payer: Self-pay

## 2020-12-28 ENCOUNTER — Telehealth: Payer: Self-pay

## 2020-12-28 ENCOUNTER — Inpatient Hospital Stay: Payer: Managed Care, Other (non HMO) | Admitting: Dietician

## 2020-12-28 VITALS — BP 105/80 | HR 95 | Resp 16 | Wt 137.2 lb

## 2020-12-28 DIAGNOSIS — Z7189 Other specified counseling: Secondary | ICD-10-CM | POA: Diagnosis not present

## 2020-12-28 DIAGNOSIS — R053 Chronic cough: Secondary | ICD-10-CM | POA: Diagnosis not present

## 2020-12-28 DIAGNOSIS — C01 Malignant neoplasm of base of tongue: Secondary | ICD-10-CM

## 2020-12-28 DIAGNOSIS — C090 Malignant neoplasm of tonsillar fossa: Secondary | ICD-10-CM

## 2020-12-28 LAB — CBC WITH DIFFERENTIAL (CANCER CENTER ONLY)
Abs Immature Granulocytes: 0.42 10*3/uL — ABNORMAL HIGH (ref 0.00–0.07)
Basophils Absolute: 0 10*3/uL (ref 0.0–0.1)
Basophils Relative: 1 %
Eosinophils Absolute: 0 10*3/uL (ref 0.0–0.5)
Eosinophils Relative: 1 %
HCT: 30.8 % — ABNORMAL LOW (ref 39.0–52.0)
Hemoglobin: 10.2 g/dL — ABNORMAL LOW (ref 13.0–17.0)
Immature Granulocytes: 7 %
Lymphocytes Relative: 13 %
Lymphs Abs: 0.9 10*3/uL (ref 0.7–4.0)
MCH: 32.2 pg (ref 26.0–34.0)
MCHC: 33.1 g/dL (ref 30.0–36.0)
MCV: 97.2 fL (ref 80.0–100.0)
Monocytes Absolute: 1.1 10*3/uL — ABNORMAL HIGH (ref 0.1–1.0)
Monocytes Relative: 16 %
Neutro Abs: 4.1 10*3/uL (ref 1.7–7.7)
Neutrophils Relative %: 62 %
Platelet Count: 265 10*3/uL (ref 150–400)
RBC: 3.17 MIL/uL — ABNORMAL LOW (ref 4.22–5.81)
RDW: 16.4 % — ABNORMAL HIGH (ref 11.5–15.5)
Smear Review: NORMAL
WBC Count: 6.5 10*3/uL (ref 4.0–10.5)
nRBC: 0 % (ref 0.0–0.2)

## 2020-12-28 LAB — CMP (CANCER CENTER ONLY)
ALT: 14 U/L (ref 0–44)
AST: 23 U/L (ref 15–41)
Albumin: 3.8 g/dL (ref 3.5–5.0)
Alkaline Phosphatase: 93 U/L (ref 38–126)
Anion gap: 11 (ref 5–15)
BUN: 10 mg/dL (ref 6–20)
CO2: 24 mmol/L (ref 22–32)
Calcium: 10.3 mg/dL (ref 8.9–10.3)
Chloride: 109 mmol/L (ref 98–111)
Creatinine: 0.62 mg/dL (ref 0.61–1.24)
GFR, Estimated: >60 mL/min (ref >60)
Glucose, Bld: 65 mg/dL — ABNORMAL LOW (ref 70–99)
Potassium: 3.6 mmol/L (ref 3.5–5.1)
Sodium: 144 mmol/L (ref 135–145)
Total Bilirubin: 0.2 mg/dL — ABNORMAL LOW (ref 0.3–1.2)
Total Protein: 8.1 g/dL (ref 6.5–8.1)

## 2020-12-28 LAB — SAMPLE TO BLOOD BANK

## 2020-12-28 LAB — TSH: TSH: 5.537 u[IU]/mL — ABNORMAL HIGH (ref 0.320–4.118)

## 2020-12-28 MED ORDER — BENZONATATE 100 MG PO CAPS: 100.0000 mg | ORAL_CAPSULE | Freq: Three times a day (TID) | ORAL | 0 refills | Status: DC | PRN

## 2020-12-28 NOTE — Assessment & Plan Note (Signed)
We have today discussed about goals of care.  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  He would like to try some more chemotherapy at this time however he is acceptable to the fact that he may have to move forward with hospice if his performance status deteriorates.

## 2020-12-28 NOTE — Assessment & Plan Note (Signed)
This is a very pleasant 50 year old male patient with metastatic HPV positive squamous cell carcinoma currently on treatment with 5-fluorouracil, carboplatin and Keytruda,transitioned to [facility] and carboplatin given severe mucositis with 5-fluorouracil despite dose reduction status post 3 cycles with excellent response on PET scan now here for follow after repeat imaging. His last imaging recently showed progression, multiple sites. Wife is in touch with [name] for any possible clinical trial.  He was previously seen at the [facility].  If he were not to go on a trial, we can try cisplatin with docetaxel.  I do not see a very clear role in continuing immunotherapy given progression.  He will follow-up with my colleague Dr. Alvy Bimler in a couple weeks for further plan of care.  Thank you for consulting [place] in the care of this patient.  Please do not hesitate to contact us with any additional questions or concerns

## 2020-12-28 NOTE — Telephone Encounter (Signed)
Today's lab results faxed to [name] per patient's request.

## 2020-12-28 NOTE — Assessment & Plan Note (Signed)
Ongoing cough, patient has been taking some over-the-counter liquid medication.  Prescribed Tessalon Pearls capsule for management of cough.  His PET/CT did not show any evidence of pneumonia.  He most likely has some persistent cough from his prior PE and pneumonia.

## 2020-12-28 NOTE — Progress Notes (Signed)
[place] NOTE  Patient Care Team: Benay Pike, MD as PCP - General (Hematology and Oncology) [name], MD as Consulting Physician (Otolaryngology) [name], MD as Attending Physician (Radiation Oncology) Leota Sauers, RN (Inactive) as Registered Nurse (Oncology) Wynelle Beckmann, Melodie Bouillon, PT as Physical Therapist (Physical Therapy) Sharen Counter, CCC-SLP as Speech Language Pathologist (Speech Pathology) Karie Mainland, RD as Dietitian (Nutrition) Malmfelt, Stephani Police, RN as Oncology Nurse Navigator (Oncology)  CHIEF COMPLAINTS/PURPOSE OF CONSULTATION:   Follow up after recent hospitalization for PE  ASSESSMENT & PLAN:   Malignant neoplasm of base of tongue [facility]) This is a very pleasant 50 year old male patient with metastatic HPV positive squamous cell carcinoma currently on treatment with 5-fluorouracil, carboplatin and Keytruda,transitioned to [facility] and carboplatin given severe mucositis with 5-fluorouracil despite dose reduction status post 3 cycles with excellent response on PET scan now here for follow after repeat imaging. His last imaging recently showed progression, multiple sites. Wife is in touch with [name] for any possible clinical trial.  He was previously seen at the [facility].  If he were not to go on a trial, we can try cisplatin with docetaxel.  I do not see a very clear role in continuing immunotherapy given progression.  He will follow-up with my colleague Dr. Alvy Bimler in a couple weeks for further plan of care.  Thank you for consulting [place] in the care of this patient.  Please do not hesitate to contact us with any additional questions or concerns  Cough Ongoing cough, patient has been taking some over-the-counter liquid medication.  Prescribed Tessalon Pearls capsule for management of cough.  His PET/CT did not show any evidence of pneumonia.  He most likely has some persistent cough from his prior PE and  pneumonia.  Goals of care, counseling/discussion We have today discussed about goals of care.  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  He would like to try some more chemotherapy at this time however he is acceptable to the fact that he may have to move forward with hospice if his performance status deteriorates.    HISTORY OF PRESENTING ILLNESS:   Cam Dauphin 50 y.o. male is here because of metastatic HPV positive BOT cancer  Oncology History Overview Note   In summary, patient presents with Dr. Constance Holster of ENT in late 03/2018 for evaluation of an enlarging right neck mass.  CT neck showed necrotic right Level II and III necrotic LN's, possibly from oropharyngeal oral cavity primary, but the study was limited due to streak artifacts.  He underwent FNA of the R Level II LN, which showed squamous cell carcinoma, p16+.  PET in 05/2018 showed asymmetric FDG uptake in the right tonsil/base of the tongue, likely the primary malignancy.  In addition, there were two FDG-avid R Level II LN's without evidence of contralateral cervical LN or metastatic disease.  Given the relatively low volume disease, Dr Maylon Peppers recommended upfront surgery, followed by adjuvant therapy based on the final pathology. He had TORS on 06/16/2018.  Pathology from the procedure showed: invasive p16-positive (HPV-related) squamous cell carcinoma, 1.4 cm, negative margins. Out of 24 biopsied lymph nodes, only one was positive for metastatic carcinoma (1/24).  This node was 4.5cm but with no extracapsular extension.  No LVSI and no PNI.  He underwent right neck dissection but not left  neck dissection.  At postop follow up, they agreed to proceed with observation and reserve radiation therapy for any possible future recurrence.   At follow up on 04/07/2019, Dr. Hezzie Bump  noted a new suspicious left upper neck lymph node. Neck CT performed that day revealed: new heterogeneous 2.2 cm contralateral left level 2a lymph node suspicious for contralateral nodal metastasis; indeterminate mild interval increase in size of several left level 2a and 2b lymph nodes.   PET scan performed on 04/16/2019 showed: single hypermetabolic left cervical lymph node; mild asymmetric FDG uptake in left glossotonsillar sulcus; focal FDG uptake in L1 vertebral body without CT correlate; otherwise, no malignant-range FDG uptake elsewhere.  He underwent left tonsillectomy and left neck dissection on 05/04/2019 with pathology revealing: benign left tonsil. Out of a total of 25 biopsied lymph nodes, only one was positive for metastatic carcinoma but negative for extracapsular extension and positive for p16.  He was advised adjuvant radiation at this time. He completed radiation on 07/24/2019. He was seen for FU by Dr Basilio Cairo in December when he was doing well. He then started complaining of back pain in February. This led to MRI and PET imaging.  04/26/2020 MRI showed extensive metastatic disease throughout the lumbar spine as well as the sacrum and left iliac bone. No pathologic fracture or epidural Tumor.  05/04/2020 PET CT showed widespread hypermetabolic bone mets. 3.5 cm necrotic hypermetabolic met lesion in right liver. Hypermetabolic uptake in right hilum associated with 2 hypermetabolic lung nodules. Patchy/nodular ground-glass opacity in the right lung having a tree-in-bud configuration. This would be an atypical appearance for metastatic disease and infectious etiology is favored, potentially Atypical.  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   Head and neck cancer (HCC)  04/24/2018 Imaging   CT neck w/ contrast: 1. Right level 2 necrotic nodal mass and level 3 necrotic lymph node  are highly concerning for nodal metastasis from head and neck primary neoplasm likely oropharynx or the oral cavity. However, evaluation for primary neoplasm in these regions is markedly limited due to streak artifacts from dental amalgam and closed airway possibly secondary to patient's holding breath/swallowing. Recommend direct visual inspection and possibly PET CT scan for further evaluation.  2. Mild asymmetry and fullness of the right nasopharyngeal region, nonspecific.  3. 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   05/07/2018 Procedure   FNA of the R IJ Level II LN    05/07/2018 Pathology Results   ACCESSION NUMBER: [id number]  Right IJ chain lymph node, FNA Squamous cell carcinoma, metastatic; p16+    05/28/2018 Imaging   PET: IMPRESSION: 1. Asymmetric hypermetabolic activity in the RIGHT lingular tonsil/base of tongue region favored primary carcinoma. 2. Two hypermetabolic RIGHT level II metastatic lymph nodes. 3. No LEFT cervical lymphadenopathy.  No distant metastatic disease.   Malignant neoplasm of base of tongue (HCC)  04/24/2018 Imaging   CT neck w/ contrast: 1. Right level 2 necrotic nodal mass and level 3 necrotic lymph node are highly concerning for nodal metastasis from head and neck primary neoplasm likely oropharynx or the oral cavity. However, evaluation for primary neoplasm in these regions is markedly limited due to streak artifacts from dental amalgam and closed airway possibly secondary to patient's holding breath/swallowing. Recommend direct visual inspection and possibly PET CT scan for further evaluation.  2. Mild asymmetry and fullness of the right nasopharyngeal region, nonspecific.  3. 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   05/07/2018 Procedure   FNA of the right cervical LN   05/07/2018 Pathology Results   ACCESSION NUMBER: [id number]  Right IJ chain lymph node, FNA Squamous cell carcinoma, metastatic; p16+    05/28/2018  Imaging   PET: IMPRESSION: 1. Asymmetric hypermetabolic activity in the RIGHT lingular tonsil/base of tongue region favored primary carcinoma. 2. Two hypermetabolic RIGHT level II metastatic lymph nodes. 3. No LEFT cervical lymphadenopathy.  No distant metastatic disease.   06/02/2018 Initial Diagnosis   Malignant neoplasm of base of tongue ([place])   06/02/2018 Cancer Staging   Staging form: Pharynx - HPV-Mediated Oropharynx, AJCC 8th Edition - Clinical: Stage I (cT2, cN1, cM0, p16+) - Signed by [name], MD on 06/02/2018   05/29/2019 Cancer Staging   Staging form: Pharynx - HPV-Mediated Oropharynx, AJCC 8th Edition - Pathologic stage from 05/29/2019: Stage I (rpT0, pN1, cM0, p16+) - Signed by [name], MD on 05/29/2019   06/27/2020 - 06/27/2020 Chemotherapy          07/18/2020 -  Chemotherapy   Patient is on Treatment Plan : HEAD/NECK Pembrolizumab + Carboplatin + 5FU q21d x 6 cycles / Pembrolizumab q21d     Malignant neoplasm of tonsillar fossa ([place])  06/27/2020 - 06/27/2020 Chemotherapy          07/18/2020 -  Chemotherapy   Patient is on Treatment Plan : HEAD/NECK Pembrolizumab + Carboplatin + 5FU q21d x 6 cycles / Pembrolizumab q21d      Interval History  Since last visit here, he has been feeling pretty tired.  No new pains. He is here to discuss about PET/CT results. We have discussed that there is considerable progression in multiple organs noted on the PET/CT including new lung metastasis, hilar metastasis, hepatic lobe metastasis, increase in skeletal metastasis.  Since last visit he continues to have cough and left-sided rib pain. Wife is in touch with [name] for another possible clinical trial. Rest of the pertinent 10 point ROS reviewed and neg  MEDICAL HISTORY:  Past Medical History:  Diagnosis Date   Diabetes mellitus without complication ([place])    Malignant neoplasm of base of tongue ([place]) 06/02/2018    SURGICAL HISTORY: Past Surgical History:   Procedure Laterality Date   IR IMAGING GUIDED PORT INSERTION  07/15/2020   left neck dissection Left 05/04/2019   Left Neck Dissection by Dr. [name] at [facility].    neck sugery     as a child "had a knot removed from neck" not sure which side.    RIght neck dissection Right 06/16/2018   TORS and right neck dissection. Dr. [name] at [facility]    SOCIAL HISTORY: Social History   Socioeconomic History   Marital status: Married    Spouse name: Not on file   Number of children: 4   Years of education: Not on file   Highest education level: Not on file  Occupational History   Not on file  Tobacco Use   Smoking status: Never   Smokeless tobacco: Never  Vaping Use   Vaping Use: Never used  Substance and Sexual Activity   Alcohol use: Yes    Comment: occasional   Drug use: Never   Sexual activity: Not on file  Other Topics Concern   Not on file  Social History Narrative   Not on file   Social Determinants of Health   Financial Resource Strain: Not on file  Food Insecurity: Not  on file  Transportation Needs: Not on file  Physical Activity: Not on file  Stress: Not on file  Social Connections: Not on file  Intimate Partner Violence: Not on file    FAMILY HISTORY: No family history on file.  ALLERGIES:  is allergic to penicillins.  MEDICATIONS:  Current Outpatient Medications  Medication Sig Dispense Refill   albuterol (VENTOLIN HFA) 108 (90 Base) MCG/ACT inhaler Inhale 2 puffs into the lungs every 6 (six) hours as needed for wheezing or shortness of breath. 8 g 0   benzonatate (TESSALON) 100 MG capsule Take 1 capsule (100 mg total) by mouth 3 (three) times daily as needed for cough. 30 capsule 0   blood glucose meter kit and supplies KIT Dispense based on patient and insurance preference. Use up to four times daily as directed. 1 each 0   diphenhydrAMINE (BENADRYL) 25 MG tablet Take 1 tablet (25 mg total) by mouth at bedtime. 30 tablet    ELIQUIS 5 MG TABS tablet TAKE 1  TABLET BY MOUTH TWICE A DAY 60 tablet 0   gabapentin (NEURONTIN) 300 MG capsule Take 900 mg by mouth 2 (two) times daily.     glipiZIDE (GLUCOTROL) 5 MG tablet Take 0.5 tablets (2.5 mg total) by mouth daily before breakfast. 45 tablet 0   Insulin Pen Needle 29G X 5MM MISC For sliding scale insulin injection 100 each 0   lactose free nutrition (BOOST) LIQD Take 237 mLs by mouth 3 (three) times daily between meals.     lidocaine (LIDODERM) 5 % Place 1 patch onto the skin daily. Remove & Discard patch within 12 hours or as directed by MD (Patient taking differently: Place 1 patch onto the skin daily as needed (for pain- Remove & Discard patch within 12 hours or as directed by MD).) 30 patch 0   lidocaine-prilocaine (EMLA) cream Apply 1 application topically as directed. For port-a-cath access 30 g G   magic mouthwash SOLN Take 15 mLs by mouth 3 (three) times daily.     megestrol (MEGACE ES) 625 MG/5ML suspension Take 5 mLs (625 mg total) by mouth daily. 150 mL 0   metFORMIN (GLUCOPHAGE) 500 MG tablet Take 1 tablet (500 mg total) by mouth 2 (two) times daily with a meal. 180 tablet 0   mirtazapine (REMERON) 15 MG tablet TAKE 2 TABLETS BY MOUTH AT BEDTIME 60 tablet 0   ondansetron (ZOFRAN ODT) 8 MG disintegrating tablet Take 1 tablet (8 mg total) by mouth every 8 (eight) hours as needed for nausea or vomiting. (Patient taking differently: Take 8 mg by mouth every 8 (eight) hours as needed for nausea or vomiting (dissolve orally).) 30 tablet 3   oxyCODONE (OXY IR/ROXICODONE) 5 MG immediate release tablet Take 1 tablet (5 mg total) by mouth at bedtime. 60 tablet 0   polyethylene glycol (MIRALAX / GLYCOLAX) 17 g packet Take 17 g by mouth daily.     prochlorperazine (COMPAZINE) 10 MG tablet Take 10 mg by mouth every 6 (six) hours as needed for nausea or vomiting.     senna (SENOKOT) 8.6 MG TABS tablet Take 1 tablet (8.6 mg total) by mouth 2 (two) times daily. 120 tablet 0   No current facility-administered  medications for this visit.   Facility-Administered Medications Ordered in Other Visits  Medication Dose Route Frequency Provider Last Rate Last Admin   sodium chloride flush (NS) 0.9 % injection 10 mL  10 mL Intracatheter PRN Benay Pike, MD   10 mL at 07/22/20 1638  PHYSICAL EXAMINATION: ECOG PERFORMANCE STATUS: 2 - Symptomatic, <50% confined to bed  Vitals:   12/28/20 1017  BP: 105/80  Pulse: 95  Resp: 16  SpO2: 100%    Filed Weights   12/28/20 1017  Weight: 137 lb 3 oz (62.2 kg)   Physical exam deferred today in lieu of counseling.   LABORATORY DATA:  I have reviewed the data as listed Lab Results  Component Value Date   WBC 6.5 12/28/2020   HGB 10.2 (L) 12/28/2020   HCT 30.8 (L) 12/28/2020   MCV 97.2 12/28/2020   PLT 265 12/28/2020     Chemistry      Component Value Date/Time   NA 144 12/28/2020 1107   K 3.6 12/28/2020 1107   CL 109 12/28/2020 1107   CO2 24 12/28/2020 1107   BUN 10 12/28/2020 1107   CREATININE 0.62 12/28/2020 1107      Component Value Date/Time   CALCIUM 10.3 12/28/2020 1107   ALKPHOS 93 12/28/2020 1107   AST 23 12/28/2020 1107   ALT 14 12/28/2020 1107   BILITOT 0.2 (L) 12/28/2020 1107      RADIOGRAPHIC STUDIES: I have personally reviewed the radiological images as listed and agreed with the findings in the report.  NM PET Image Restag (PS) Skull Base To Thigh  Result Date: 12/23/2020 CLINICAL DATA:  Subsequent treatment strategy for head neck carcinoma. EXAM: NUCLEAR MEDICINE PET SKULL BASE TO THIGH TECHNIQUE: 6.8 mCi F-18 FDG was injected intravenously. Full-ring PET imaging was performed from the skull base to thigh after the radiotracer. CT data was obtained and used for attenuation correction and anatomic localization. Fasting blood glucose: 85 mg/dl COMPARISON:  PET-CT 09/15/2020 FINDINGS: Mediastinal blood pool activity: SUV max 1.5 Liver activity: SUV max NA NECK: No hypermetabolic lymph nodes in the neck. Incidental CT  findings: Port in the anterior chest wall with tip in distal SVC. CHEST: Dominant nodule in the RIGHT middle lobe measures 21 mm increased from 11 mm (image 93/4) has intense metabolic activity with SUV max equal 17.6 increased from 5.9. Hypermetabolic nodule in the LEFT upper lobe measuring 8 mm (image 78) with SUV max equal 5.4 is new from prior. New LEFT hilar lesion measuring 17 mm (image 78/4) has intense metabolic activity SUV max equal 10.8. Incidental CT findings: none ABDOMEN/PELVIS: New small hypermetabolic lesion in the RIGHT hepatic lobe with SUV max equal 6.2 on image 108. There is a subtle hypodensity on the CT noncontrast exam measuring 10 mm. Second lesion along the inferior margin of the RIGHT hepatic lobe measuring 20 mm (126/4) has intense metabolic activity with SUV max equal 11.9. This is increased from a subtle lesion with SUV max equal 3.7 on comparison exam. Diffuse activity associated the colon is favored benign. Incidental CT findings: none SKELETON: There multiple foci of hypermetabolic skeletal metastasis. Interval soft tissue expansion of metastatic lesion anterior RIGHT rib SUV max equal 12.6 increased from SUV max equal 4.8. This expansile soft tissue component measures 20 mm in depth compared to 10 mm on prior. Increase in activity of a RIGHT humeral head lesion with SUV max equal 12.0 increased from SUV max equal 4.4. There is increased in bone sclerosis at this level. New lesion in the RIGHT iliac wing with SUV max equal 12.6. This is a broad lesion with sclerosis and cortical disruption. Similar lesion in the LEFT iliac wing. Incidental CT findings: none IMPRESSION: 1. Considerable progression multi organ hypermetabolic metastasis. 2. Interval increase in size and new RIGHT  lung pulmonary metastasis and LEFT hilar metastasis. 3. New and increased RIGHT hepatic lobe metastasis. 4. New and increased skeletal metastasis with soft tissue expansion of several lesions. Electronically  Signed   By: Suzy Bouchard M.D.   On: 12/23/2020 10:08    PET scan from 12/21/2020   1. Considerable progression multi organ hypermetabolic metastasis. 2. Interval increase in size and new RIGHT lung pulmonary metastasis and LEFT hilar metastasis. 3. New and increased RIGHT hepatic lobe metastasis. 4. New and increased skeletal metastasis with soft tissue expansion of several lesions  All questions were answered. The patient knows to call the clinic with any problems, questions or concerns. I spent 30 minutes in the care of this patient including history and physical review of records counseling and coordination of care.    Benay Pike, MD 12/28/2020 2:43 PM

## 2020-12-29 ENCOUNTER — Encounter: Payer: Self-pay | Admitting: General Practice

## 2020-12-29 LAB — T4: T4, Total: 7.6 ug/dL (ref 4.5–12.0)

## 2020-12-29 NOTE — Progress Notes (Signed)
[facility] CSW Progress Notes  Call to wife per referral to offer support/resources for family.  Discussed ways children process challenging situations when parents are diagnosed/treated for cancer.  Discussed healthy vs less optimal coping strategies.  Discussed various community options for caregiver support and for support for children.  Will email her various resources, she will let me know what options she would like to pursue.  She is encouraged to keep in touch w CSW as needed.    Edwyna Shell, LCSW Clinical Social Worker Phone:  [phone]

## 2021-01-02 ENCOUNTER — Other Ambulatory Visit: Payer: Self-pay | Admitting: Hematology and Oncology

## 2021-01-02 ENCOUNTER — Encounter: Payer: Self-pay | Admitting: Hematology and Oncology

## 2021-01-02 MED ORDER — MORPHINE SULFATE 15 MG PO TABS: 15.0000 mg | ORAL_TABLET | Freq: Four times a day (QID) | ORAL | 0 refills | Status: DC | PRN

## 2021-01-02 NOTE — Progress Notes (Signed)
I called Ms Groft back. His pain is gotten out of control, waking up in the middle of night with a left rib pain. Changed the short acting medication to Morphine IR. Sent to pharmacy of choice.  Albert Flowers

## 2021-01-05 ENCOUNTER — Encounter: Payer: Self-pay | Admitting: Hematology and Oncology

## 2021-01-08 ENCOUNTER — Other Ambulatory Visit: Payer: Self-pay | Admitting: Hematology and Oncology

## 2021-01-09 ENCOUNTER — Telehealth: Payer: Self-pay | Admitting: Hematology

## 2021-01-09 NOTE — Telephone Encounter (Signed)
Attempted to call pt x2 to reschedule. Was unable to reach pt, will keep original appt

## 2021-01-10 ENCOUNTER — Encounter: Payer: Self-pay | Admitting: Hematology and Oncology

## 2021-01-11 ENCOUNTER — Other Ambulatory Visit: Payer: Managed Care, Other (non HMO)

## 2021-01-11 ENCOUNTER — Ambulatory Visit: Payer: Managed Care, Other (non HMO)

## 2021-01-11 ENCOUNTER — Telehealth: Payer: Self-pay

## 2021-01-11 ENCOUNTER — Other Ambulatory Visit (HOSPITAL_COMMUNITY): Payer: Self-pay

## 2021-01-11 ENCOUNTER — Encounter: Payer: Self-pay | Admitting: Hematology and Oncology

## 2021-01-11 ENCOUNTER — Other Ambulatory Visit: Payer: Self-pay

## 2021-01-11 ENCOUNTER — Inpatient Hospital Stay (HOSPITAL_BASED_OUTPATIENT_CLINIC_OR_DEPARTMENT_OTHER): Payer: Managed Care, Other (non HMO) | Admitting: Hematology and Oncology

## 2021-01-11 VITALS — BP 116/76 | HR 98 | Temp 97.4°F | Resp 17 | Ht 69.0 in | Wt 135.0 lb

## 2021-01-11 DIAGNOSIS — C01 Malignant neoplasm of base of tongue: Secondary | ICD-10-CM | POA: Diagnosis not present

## 2021-01-11 DIAGNOSIS — E039 Hypothyroidism, unspecified: Secondary | ICD-10-CM

## 2021-01-11 DIAGNOSIS — K1231 Oral mucositis (ulcerative) due to antineoplastic therapy: Secondary | ICD-10-CM

## 2021-01-11 DIAGNOSIS — K5909 Other constipation: Secondary | ICD-10-CM

## 2021-01-11 DIAGNOSIS — G893 Neoplasm related pain (acute) (chronic): Secondary | ICD-10-CM | POA: Diagnosis not present

## 2021-01-11 DIAGNOSIS — E43 Unspecified severe protein-calorie malnutrition: Secondary | ICD-10-CM

## 2021-01-11 DIAGNOSIS — E119 Type 2 diabetes mellitus without complications: Secondary | ICD-10-CM

## 2021-01-11 DIAGNOSIS — R293 Abnormal posture: Secondary | ICD-10-CM

## 2021-01-11 DIAGNOSIS — C7951 Secondary malignant neoplasm of bone: Secondary | ICD-10-CM

## 2021-01-11 DIAGNOSIS — I2609 Other pulmonary embolism with acute cor pulmonale: Secondary | ICD-10-CM

## 2021-01-11 MED ORDER — MORPHINE SULFATE ER 15 MG PO TBCR
15.0000 mg | EXTENDED_RELEASE_TABLET | Freq: Two times a day (BID) | ORAL | 0 refills | Status: AC
  Filled 2021-01-11: qty 60, 30d supply, fill #0

## 2021-01-11 MED ORDER — MEGESTROL ACETATE 40 MG PO TABS
40.0000 mg | ORAL_TABLET | Freq: Two times a day (BID) | ORAL | 3 refills | Status: DC
  Filled 2021-01-11: qty 60, 30d supply, fill #0

## 2021-01-11 MED ORDER — OXYCODONE HCL 10 MG PO TABS
10.0000 mg | ORAL_TABLET | ORAL | 0 refills | Status: DC | PRN
  Filled 2021-01-11: qty 90, 15d supply, fill #0

## 2021-01-11 NOTE — Assessment & Plan Note (Addendum)
The patient has 80 pound weight loss over the past year due to his disease and side effects related to his treatment We discussed importance of frequent small meals, high-protein intake and nutritional supplement as tolerated I will switch his liquid Megace to pills I gave the patient a daily sheet to document his oral intake, with goal to reach between 1500 to 2000 cal/day and 90 g of protein Due to risk of excessive sedation, I recommend him to hold mirtazapine for day or 2 when we started him on long-acting pain medicine

## 2021-01-11 NOTE — Assessment & Plan Note (Signed)
He has poorly controlled cancer associated pain I recommend increasing his pain medicine regimen I recommend MS Contin twice daily, starting with daily at nighttime and to slowly increase to twice daily depending on side effects I will also increase breakthrough oxycodone from 5 mg to 10 mg dose I recommend the patient to start documenting his pain medicine intake We discussed various side effects including risk of nausea, sedation and constipation

## 2021-01-11 NOTE — Assessment & Plan Note (Signed)
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, biopsy-proven with liver metastasis and clinical bone pain from bony metastasis Since his diagnosis of relapse, he was treated with palliative radiation to his bones and completed 6 cycles of carboplatin, 5-FU and pembrolizumab His treatment course was complicated by severe mucositis, malnutrition, COVID-19 infection, pneumonia, pulmonary embolism, recurrent hospitalization, severe pancytopenia and others Unfortunately, his recent imaging study done 3 weeks ago show progression of disease He had been seen at [facility] for second opinion and is offered treatment in the setting of clinical trial The patient will be flying next week to [place] and return within 3 weeks to celebrate Thanksgiving holidays We will try to assist in getting his imaging studies sent to [name] to facilitate his future biopsy I will defer to the expert opinion of oncologist at [name] to direct his next line of treatment In the meantime, we will focus on aggressive supportive care here locally Today, I spent over an hour reviewing his chart, discussed various supportive care measures and adjusted and modify his medications I plan to set a virtual visit next week to check on him and will plan to see him back right before Thanksgiving for further supportive care

## 2021-01-11 NOTE — Assessment & Plan Note (Signed)
I recommend the patient to continue MiraLAX daily and to add Senokot 2 pills twice daily I also recommend the patient to document his bowel movement so that we can track it better

## 2021-01-11 NOTE — Telephone Encounter (Signed)
Called and spoke with wife/patient scheduled in person visit at 11 am for 45 minutes. They are aware of appt.

## 2021-01-11 NOTE — Progress Notes (Signed)
[facility] progress notes  Patient Care Team: Benay Pike, MD as PCP - General (Hematology and Oncology) [name], MD as Consulting Physician (Otolaryngology) [name], MD as Attending Physician (Radiation Oncology) Leota Sauers, RN (Inactive) as Registered Nurse (Oncology) Wynelle Beckmann, Melodie Bouillon, PT as Physical Therapist (Physical Therapy) Sharen Counter, CCC-SLP as Speech Language Pathologist (Speech Pathology) Karie Mainland, RD as Dietitian (Nutrition) Malmfelt, Stephani Police, RN as Oncology Nurse Navigator (Oncology)  CHIEF COMPLAINTS/PURPOSE OF VISIT: Squamous cell carcinoma of the oropharynx, with recurrent metastatic disease, for further management The patient is here accompanied by his wife, Albert Flowers  HISTORY OF PRESENTING ILLNESS:  Albert Flowers 50 y.o. male was transferred to my care after his prior physician is away from maternity leave He was originally a patient of Dr. Maylon Peppers and most recently a patient of Dr. Chryl Heck The most important reason why he is seen today is because of uncontrolled pain. He was recently seen at [facility] for second opinion.  He ran out of his pain medicine and his primary oncology sent a prescription of morphine to [place] but that was not accepted He was prescribed Norco by another physician in [place] for 10 days and he took his last pill this morning He felt that the pain medicine only control his pain for about 2-1/2 hours On average, he take at least 4 pain medicine per day The pain would wake him up in the middle of the night He is somewhat constipated from pain medicine.  On average, he only have 1 bowel movement every 3 days He has lost a lot of weight over the last few months due to side effects of treatment and progression of disease Overall, he has lost 80 pounds over the course of the last year He attempted to eat small meals.  He is prescribed liquid Megace and Remeron as appetite stimulant He denies  nausea He is scheduled to return to [name] next week with scheduled biopsy prior to enrollment to clinical trial.  According to the patient, he will be returning to [name] every 3 weeks for his treatment in the future but would like to return here for supportive care He is currently not working due to his debility from disease His activity is somewhat limited to self-care only. I have reviewed his records extensively and noted multiple hospitalizations for various recent over the past few months.  I reviewed the patient's records extensive and collaborated the history with the patient. Summary of his history is as follows: Oncology History  Malignant neoplasm of base of tongue ([address])  04/24/2018 Imaging   CT neck w/ contrast: 1. Right level 2 necrotic nodal mass and level 3 necrotic lymph node are highly concerning for nodal metastasis from head and neck primary neoplasm likely oropharynx or the oral cavity. However, evaluation for primary neoplasm in these regions is markedly limited due to streak artifacts from dental amalgam and closed airway possibly secondary to patient's holding breath/swallowing. Recommend direct visual inspection and possibly PET CT scan for further evaluation.  2. Mild asymmetry and fullness of the right nasopharyngeal region, nonspecific.  3. 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   05/07/2018 Procedure   FNA of the right cervical LN   05/07/2018 Pathology Results   ACCESSION NUMBER: [id number]  Right IJ chain lymph node, FNA Squamous cell carcinoma, metastatic; p16+    05/28/2018 Imaging   PET: IMPRESSION: 1. Asymmetric hypermetabolic activity in the RIGHT lingular  tonsil/base of tongue region favored primary carcinoma. 2. Two hypermetabolic RIGHT level II metastatic lymph nodes. 3. No LEFT cervical lymphadenopathy.  No distant metastatic disease.   06/02/2018 Initial Diagnosis   In summary, patient presents with Dr. Constance Holster of ENT in  late 03/2018 for evaluation of an enlarging right neck mass.  CT neck showed necrotic right Level II and III necrotic LN's, possibly from oropharyngeal oral cavity primary, but the study was limited due to streak artifacts.  He underwent FNA of the R Level II LN, which showed squamous cell carcinoma, p16+.  PET in 05/2018 showed asymmetric FDG uptake in the right tonsil/base of the tongue, likely the primary malignancy.  In addition, there were two FDG-avid R Level II LN's without evidence of contralateral cervical LN or metastatic disease.  Given the relatively low volume disease, Dr Maylon Peppers recommended upfront surgery, followed by adjuvant therapy based on the final pathology. He had TORS on 06/16/2018.  Pathology from the procedure showed: invasive p16-positive (HPV-related) squamous cell carcinoma, 1.4 cm, negative margins. Out of 24 biopsied lymph nodes, only one was positive for metastatic carcinoma (1/24).  This node was 4.5cm but with no extracapsular extension.  No LVSI and no PNI.  He underwent right neck dissection but not left neck dissection.  At postop follow up, they agreed to proceed with observation and reserve radiation therapy for any possible future recurrence.   At follow up on 04/07/2019, Dr. [name] noted a new suspicious left upper neck lymph node. Neck CT performed that day revealed: new heterogeneous 2.2 cm contralateral left level 2a lymph node suspicious for contralateral nodal metastasis; indeterminate mild interval increase in size of several left level 2a and 2b lymph nodes.   PET scan performed on 04/16/2019 showed: single hypermetabolic left cervical lymph node; mild asymmetric FDG uptake in left glossotonsillar sulcus; focal FDG uptake in L1 vertebral body without CT correlate; otherwise, no malignant-range FDG uptake elsewhere.  He underwent left tonsillectomy and left neck dissection on 05/04/2019 with pathology revealing: benign left tonsil. Out of a total of 25 biopsied  lymph nodes, only one was positive for metastatic carcinoma but negative for extracapsular extension and positive for p16.  He was advised adjuvant radiation at this time. He completed radiation on 07/24/2019.   05/29/2019 Cancer Staging   Staging form: Pharynx - HPV-Mediated Oropharynx, AJCC 8th Edition - Pathologic stage from 05/29/2019: Stage I (rpT0, pN1, cM0, p16+) - Signed by [name], MD on 05/29/2019    11/07/2019 Imaging   1. Patchy ground-glass predominantly at the LEFT lung base, mild bronchial wall thickening in areas subtending affected areas of lung with ground-glass nodule in the RIGHT chest without bronchial wall thickening. Findings may be indicative of pneumonitis, perhaps from aspiration in this patient with head and neck cancer. Suggest short interval follow-up to ensure resolution, within 8-12 weeks. 2. Stranding in the low neck better demonstrated on the neck CT acquired on the same date compatible with post treatment changes, incompletely imaged. 3. Calcified granulomatous changes in the RIGHT hilum and RIGHT middle lobe. 4. Aortic atherosclerosis.   Aortic Atherosclerosis (ICD10-I70.0).   02/17/2020 Imaging   1. Near complete resolution of ground-glass nodule in the lower lobes consistent resolving infectious or inflammatory process. 2. Single small new pulmonary nodule in the LEFT lower lobe. Recommend follow-up CT in 3 to 6 months.   05/04/2020 PET scan   1. Widespread hypermetabolic bone metastases. 2. 3.5 cm necrotic hypermetabolic metastatic lesion in the right liver. 3. Hypermetabolic uptake  in the right hilum associated with 2 hypermetabolic left lung nodules. Imaging appearance is concerning for metastatic disease. 4. Patchy/nodular ground-glass opacity in the right lung having a tree-in-bud configuration. This would be an atypical appearance for metastatic disease and infectious etiology is favored, potentially atypical. 5.  Aortic Atherosclerois  (ICD10-170.0)   05/11/2020 - 05/30/2020 Radiation Therapy   Radiation Treatment Dates: 05/11/2020 through 05/30/2020 Site Technique Total Dose (Gy) Dose per Fx (Gy) Completed Fx Beam Energies  T_L_S Spine_+L_hip 3D 35 2.5 14 10X, 15X     05/12/2020 Pathology Results   A. 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  - See comment.   COMMENT:  The carcinoma with immunohistochemistry is positive with cytokeratin 5/6, p63 and p40 and shows diffuse strong positivity with p16.  The carcinoma is negative with CD56, chromogranin and synaptophysin.  The  immunophenotype is consistent with squamous cell carcinoma   06/27/2020 - 12/07/2020 Chemotherapy   Patient is on Treatment Plan : HEAD/NECK Pembrolizumab + Carboplatin + 5FU q21d x 6 cycles / Pembrolizumab q21d      08/28/2020 - 08/30/2020 Hospital Admission   He was hospitalized for COVID-19 infection   09/15/2020 PET scan   1. Mixed response to therapy. 2. Marked improvement in skeletal metastasis with near resolution of metabolic activity of previous multifocal skeletal metastasis. Sclerotic lesions remain in the underlying bone. 3. Marked improvement in solitary hypermetabolic hepatic metastasis reduced in metabolic activity and size. 4. Two new lesions in the RIGHT lung which are hypermetabolic. One RIGHT upper lobe nodule and a new focus of hypermetabolic nodular consolidation in the RIGHT infrahilar lower lobe. 5. Improvement in ground-glass nodularity in the RIGHT upper lobe suggest resolved pulmonary infection.     09/17/2020 Imaging   1. No evidence of pulmonary emboli. 2. Rapid increase in large RIGHT LOWER lobe masslike consolidation since 38/46/6599 almost certainly related to infection/pneumonia given significant change over 2 days. Component of underlying malignancy is not excluded however. New small RIGHT pleural effusion. 3. Unchanged 12 mm RIGHT middle lobe nodule and sclerotic  lesions within bilateral ribs, proximal RIGHT humerus, sternum and thoracic spine compatible with metastatic disease.   09/17/2020 - 09/23/2020 Hospital Admission   He was hospitalized for community-acquired pneumonia   10/02/2020 - 10/07/2020 Hospital Admission   -Patient presents to ED with complaints of chest pain, findings significant for worsening pneumonia despite being on appropriate antibiotics, as well as significant for pulmonary nodule for which she was seen by pulmonary.  He completed course of IV antibiotics.  Hospitalization complicated by uncontrolled type 2 diabetes with hyperglycemia.  Was switched to metformin and glipizide with improvement of his hyperglycemia.   10/03/2020 Imaging   No evidence of pulmonary embolism.   Patchy right lower lobe opacity remains suspicious for pneumonia. Additional mild right upper lobe infection/pneumonia. Small bilateral pleural effusions, progressive on the left.   Two right lung nodules are mildly progressive from recent CT, suspicious for metastatic disease.   Mildly progressive soft tissue component associated with left anterior 4th rib metastasis. Additional multifocal osseous metastases are unchanged.   10/26/2020 Imaging   1. Mildly motion degraded exam. 2. Large volume left-sided pulmonary embolism with evidence of right heart strain. (RV/LV Ratio = 1.2) consistent with at least submassive (intermediate risk) PE. The presence of right heart strain has been associated with an increased risk of morbidity and mortality. Please refer to the "PE Focused" order set in EPIC. 3. Shifting right-sided pulmonary opacities,  most consistent with infection or aspiration. 4. Pulmonary nodules, felt to be slightly progressive and indicative of metastatic disease. 5. Widespread osseous metastasis, mildly progressive as above. 6. Coronary artery atherosclerosis. Aortic Atherosclerosis (ICD10-I70.0).     10/26/2020 - 10/30/2020 Hospital Admission   50yo with  a history of metastatic SCC of the base of the tongue and tonsil March 2020 s/p resection, radiation chemotherapy and immunotherapy, severe protein calorie malnutrition, DM2, COVID-19 infection in June 2022, CAP July 2022, and hospitalization for HCAP 7/17-22 who presented to the ER with the abrupt onset of SOB, fatigue, palpitation and tachycardia on 8/10 while at work, and admitted for acute respiratory failure with hypoxia due to large left-sided PE with right heart strain.  Patient was started on IV heparin.  Pulmonology and IR consulted and did not feel thrombectomy/thrombolysis was indicated.  Hematology recommended discharge on subcu Lovenox with a plan to transition to [facility] outpatient.   12/23/2020 PET scan   IMPRESSION: 1. Considerable progression multi organ hypermetabolic metastasis. 2. Interval increase in size and new RIGHT lung pulmonary metastasis and LEFT hilar metastasis. 3. New and increased RIGHT hepatic lobe metastasis. 4. New and increased skeletal metastasis with soft tissue expansion of several lesions.   01/11/2021 Cancer Staging   Staging form: Pharynx - HPV-Mediated Oropharynx, AJCC 8th Edition - Clinical stage from 01/11/2021: Stage IV (rcT2, cN1, pM1, p16+) - Signed by Heath Lark, MD on 01/11/2021 Stage prefix: Recurrence Laterality: Right      MEDICAL HISTORY:  Past Medical History:  Diagnosis Date   Cancer ([place])    Diabetes mellitus without complication ([place])    Malignant neoplasm of base of tongue ([place]) 06/02/2018    SURGICAL HISTORY: Past Surgical History:  Procedure Laterality Date   IR IMAGING GUIDED PORT INSERTION  07/15/2020   left neck dissection Left 05/04/2019   Left Neck Dissection by Dr. [name] at [facility].    neck sugery     as a child "had a knot removed from neck" not sure which side.    RIght neck dissection Right 06/16/2018   TORS and right neck dissection. Dr. [name] at [facility]    SOCIAL HISTORY: Social History   Socioeconomic  History   Marital status: Married    Spouse name: Not on file   Number of children: 4   Years of education: Not on file   Highest education level: Not on file  Occupational History   Not on file  Tobacco Use   Smoking status: Never   Smokeless tobacco: Never  Vaping Use   Vaping Use: Never used  Substance and Sexual Activity   Alcohol use: Yes    Comment: occasional   Drug use: Never   Sexual activity: Not on file  Other Topics Concern   Not on file  Social History Narrative   Not on file   Social Determinants of Health   Financial Resource Strain: Not on file  Food Insecurity: Not on file  Transportation Needs: Not on file  Physical Activity: Not on file  Stress: Not on file  Social Connections: Not on file  Intimate Partner Violence: Not on file    FAMILY HISTORY: History reviewed. No pertinent family history.  ALLERGIES:  is allergic to penicillins.  MEDICATIONS:  Current Outpatient Medications  Medication Sig Dispense Refill   cholecalciferol (VITAMIN D3) 25 MCG (1000 UNIT) tablet Take 2,000 Units by mouth daily.     megestrol (MEGACE) 40 MG tablet Take 1 tablet (40 mg total) by mouth 2 (  two) times daily. 60 tablet 3   morphine (MS CONTIN) 15 MG 12 hr tablet Take 1 tablet (15 mg total) by mouth every 12 (twelve) hours. 60 tablet 0   ELIQUIS 5 MG TABS tablet TAKE 1 TABLET BY MOUTH TWICE A DAY 60 tablet 0   mirtazapine (REMERON) 15 MG tablet TAKE 2 TABLETS BY MOUTH AT BEDTIME 60 tablet 0   ondansetron (ZOFRAN ODT) 8 MG disintegrating tablet Take 1 tablet (8 mg total) by mouth every 8 (eight) hours as needed for nausea or vomiting. (Patient taking differently: Take 8 mg by mouth every 8 (eight) hours as needed for nausea or vomiting (dissolve orally).) 30 tablet 3   Oxycodone HCl 10 MG TABS Take 1 tablet (10 mg total) by mouth every 4 (four) hours as needed for severe pain. 90 tablet 0   polyethylene glycol (MIRALAX / GLYCOLAX) 17 g packet Take 17 g by mouth daily.      prochlorperazine (COMPAZINE) 10 MG tablet Take 10 mg by mouth every 6 (six) hours as needed for nausea or vomiting.     senna (SENOKOT) 8.6 MG TABS tablet Take 1 tablet (8.6 mg total) by mouth 2 (two) times daily. 120 tablet 0   No current facility-administered medications for this visit.    REVIEW OF SYSTEMS:   Constitutional: Denies fevers, chills or abnormal night sweats Eyes: Denies blurriness of vision, double vision or watery eyes Ears, nose, mouth, throat, and face: Denies mucositis or sore throat Cardiovascular: Denies palpitation, chest discomfort or lower extremity swelling Skin: Denies abnormal skin rashes Lymphatics: Denies new lymphadenopathy or easy bruising Neurological:Denies numbness, tingling or new weaknesses Behavioral/Psych: Mood is stable, no new changes  All other systems were reviewed with the patient and are negative.  PHYSICAL EXAMINATION: ECOG PERFORMANCE STATUS: 1 - Symptomatic but completely ambulatory  Vitals:   01/11/21 1100  BP: 116/76  Pulse: 98  Resp: 17  Temp: (!) 97.4 F (36.3 C)  SpO2: 99%   Filed Weights   01/11/21 1100  Weight: 135 lb (61.2 kg)    GENERAL:alert, no distress and comfortable.  He looks thin and cachectic SKIN: skin color, texture, turgor are normal, no rashes or significant lesions EYES: normal, conjunctiva are pink and non-injected, sclera clear OROPHARYNX:no exudate, normal lips, buccal mucosa, and tongue  NECK: Noted well-healed surgical scar on his neck.  Noted abnormal posture with the patient bending his neck and some limitation of range of motion.  Noted neck fibrosis LYMPH:  no palpable lymphadenopathy in the cervical, axillary or inguinal LUNGS: clear to auscultation and percussion with normal breathing effort HEART: regular rate & rhythm and no murmurs without lower extremity edema ABDOMEN:abdomen soft, non-tender and normal bowel sounds Musculoskeletal:no cyanosis of digits and no clubbing  PSYCH: alert &  oriented x 3 with fluent speech NEURO: no focal motor/sensory deficits  LABORATORY DATA:  I have reviewed the data as listed Lab Results  Component Value Date   WBC 6.5 12/28/2020   HGB 10.2 (L) 12/28/2020   HCT 30.8 (L) 12/28/2020   MCV 97.2 12/28/2020   PLT 265 12/28/2020   Recent Labs    11/30/20 1313 12/07/20 1316 12/28/20 1107  NA 138 137 144  K 3.8 3.4* 3.6  CL 105 105 109  CO2 25 22 24   GLUCOSE 138* 202* 65*  BUN 7 6 10   CREATININE 0.71 0.72 0.62  CALCIUM 9.3 9.3 10.3  GFRNONAA >60 >60 >60  PROT 6.3* 6.7 8.1  ALBUMIN 3.4* 3.5 3.8  AST 19 17 23   ALT 11 8 14   ALKPHOS 125 116 93  BILITOT 0.3 0.2* 0.2*    RADIOGRAPHIC STUDIES: I have personally reviewed the radiological images as listed and agreed with the findings in the report. NM PET Image Restag (PS) Skull Base To Thigh  Result Date: 12/23/2020 CLINICAL DATA:  Subsequent treatment strategy for head neck carcinoma. EXAM: NUCLEAR MEDICINE PET SKULL BASE TO THIGH TECHNIQUE: 6.8 mCi F-18 FDG was injected intravenously. Full-ring PET imaging was performed from the skull base to thigh after the radiotracer. CT data was obtained and used for attenuation correction and anatomic localization. Fasting blood glucose: 85 mg/dl COMPARISON:  PET-CT 09/15/2020 FINDINGS: Mediastinal blood pool activity: SUV max 1.5 Liver activity: SUV max NA NECK: No hypermetabolic lymph nodes in the neck. Incidental CT findings: Port in the anterior chest wall with tip in distal SVC. CHEST: Dominant nodule in the RIGHT middle lobe measures 21 mm increased from 11 mm (image 93/4) has intense metabolic activity with SUV max equal 17.6 increased from 5.9. Hypermetabolic nodule in the LEFT upper lobe measuring 8 mm (image 78) with SUV max equal 5.4 is new from prior. New LEFT hilar lesion measuring 17 mm (image 78/4) has intense metabolic activity SUV max equal 10.8. Incidental CT findings: none ABDOMEN/PELVIS: New small hypermetabolic lesion in the RIGHT  hepatic lobe with SUV max equal 6.2 on image 108. There is a subtle hypodensity on the CT noncontrast exam measuring 10 mm. Second lesion along the inferior margin of the RIGHT hepatic lobe measuring 20 mm (126/4) has intense metabolic activity with SUV max equal 11.9. This is increased from a subtle lesion with SUV max equal 3.7 on comparison exam. Diffuse activity associated the colon is favored benign. Incidental CT findings: none SKELETON: There multiple foci of hypermetabolic skeletal metastasis. Interval soft tissue expansion of metastatic lesion anterior RIGHT rib SUV max equal 12.6 increased from SUV max equal 4.8. This expansile soft tissue component measures 20 mm in depth compared to 10 mm on prior. Increase in activity of a RIGHT humeral head lesion with SUV max equal 12.0 increased from SUV max equal 4.4. There is increased in bone sclerosis at this level. New lesion in the RIGHT iliac wing with SUV max equal 12.6. This is a broad lesion with sclerosis and cortical disruption. Similar lesion in the LEFT iliac wing. Incidental CT findings: none IMPRESSION: 1. Considerable progression multi organ hypermetabolic metastasis. 2. Interval increase in size and new RIGHT lung pulmonary metastasis and LEFT hilar metastasis. 3. New and increased RIGHT hepatic lobe metastasis. 4. New and increased skeletal metastasis with soft tissue expansion of several lesions. Electronically Signed   By: Suzy Bouchard M.D.   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, biopsy-proven with liver metastasis and clinical bone pain from bony metastasis Since his diagnosis of relapse, he was treated with palliative radiation to his bones and completed 6 cycles of carboplatin, 5-FU and pembrolizumab His treatment course was complicated by severe mucositis,  malnutrition, COVID-19 infection, pneumonia, pulmonary embolism, recurrent hospitalization, severe pancytopenia and others Unfortunately, his recent imaging study done 3 weeks ago show progression of disease He had been seen at [facility] for second opinion and is offered treatment in the setting of clinical trial The patient will be flying next week to [place]  and return within 3 weeks to celebrate Thanksgiving holidays We will try to assist in getting his imaging studies sent to [name] to facilitate his future biopsy I will defer to the expert opinion of oncologist at [name] to direct his next line of treatment In the meantime, we will focus on aggressive supportive care here locally Today, I spent over an hour reviewing his chart, discussed various supportive care measures and adjusted and modify his medications I plan to set a virtual visit next week to check on him and will plan to see him back right before Thanksgiving for further supportive care  Cancer related pain He has poorly controlled cancer associated pain I recommend increasing his pain medicine regimen I recommend MS Contin twice daily, starting with daily at nighttime and to slowly increase to twice daily depending on side effects I will also increase breakthrough oxycodone from 5 mg to 10 mg dose I recommend the patient to start documenting his pain medicine intake We discussed various side effects including risk of nausea, sedation and constipation  Other constipation I recommend the patient to continue MiraLAX daily and to add Senokot 2 pills twice daily I also recommend the patient to document his bowel movement so that we can track it better  Pulmonary embolism [facility]) He has no bleeding complications from anticoagulation therapy He will continue treatment indefinitely, but will likely need to hold for 48 hours prior to anticipated future biopsy  Metastasis to bone [facility]) He has significant bone  pain As above, I have addressed this pain medicine changes His physician at [name] recommended consideration for IV bisphosphonates I am concerned about his oral intake and the patient would be at high risk for hypocalcemia I recommend vitamin D supplement and to focus on oral intake for now Once his oral intake has improved, we can consider additional IV Zometa in the future  Mucositis due to chemotherapy He has some residual sign of mucositis in the oropharynx He is not symptomatic Observe for now  Protein-calorie malnutrition, severe The patient has 80 pound weight loss over the past year due to his disease and side effects related to his treatment We discussed importance of frequent small meals, high-protein intake and nutritional supplement as tolerated I will switch his liquid Megace to pills I gave the patient a daily sheet to document his oral intake, with goal to reach between 1500 to 2000 cal/day and 90 g of protein Due to risk of excessive sedation, I recommend him to hold mirtazapine for day or 2 when we started him on long-acting pain medicine  Diabetes mellitus without complication ([place]) Since his weight loss, and inconsistent oral intake, the patient has severe protein calorie malnutrition and mild hypoglycemia I have discontinued all his oral hypoglycemics and insulin treatment  Posture abnormality 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 We discussed daily neck exercises to preserve range of motion When he returns from his trip, I will refer him to physical therapy and rehab I recommend the patient to start walking daily, with goal to achieve 30 minutes of exercise daily while on treatment  Orders Placed This Encounter  Procedures   CBC with Differential/Platelet    Standing Status:   Standing    Number of Occurrences:   22    Standing Expiration Date:   01/11/2022   Comprehensive metabolic panel    Standing Status:   Standing    Number of  Occurrences:   33  Standing Expiration Date:   01/11/2022   TSH    Standing Status:   Standing    Number of Occurrences:   22    Standing Expiration Date:   01/11/2022    All questions were answered. The patient knows to call the clinic with any problems, questions or concerns. The total time spent in the appointment was 90 minutes encounter with patients including review of chart and various tests results, discussions about plan of care and coordination of care plan   Heath Lark, MD 01/11/2021 1:34 PM

## 2021-01-11 NOTE — Assessment & Plan Note (Signed)
He has significant bone pain As above, I have addressed this pain medicine changes His physician at [name] recommended consideration for IV bisphosphonates I am concerned about his oral intake and the patient would be at high risk for hypocalcemia I recommend vitamin D supplement and to focus on oral intake for now Once his oral intake has improved, we can consider additional IV Zometa in the future

## 2021-01-11 NOTE — Assessment & Plan Note (Signed)
He has some residual sign of mucositis in the oropharynx He is not symptomatic Observe for now

## 2021-01-11 NOTE — Assessment & Plan Note (Signed)
Since his weight loss, and inconsistent oral intake, the patient has severe protein calorie malnutrition and mild hypoglycemia I have discontinued all his oral hypoglycemics and insulin treatment

## 2021-01-11 NOTE — Assessment & Plan Note (Signed)
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 We discussed daily neck exercises to preserve range of motion When he returns from his trip, I will refer him to physical therapy and rehab I recommend the patient to start walking daily, with goal to achieve 30 minutes of exercise daily while on treatment

## 2021-01-11 NOTE — Assessment & Plan Note (Addendum)
He has no bleeding complications from anticoagulation therapy He will continue treatment indefinitely, but will likely need to hold for 48 hours prior to anticipated future biopsy

## 2021-01-12 ENCOUNTER — Inpatient Hospital Stay: Payer: Managed Care, Other (non HMO) | Admitting: Hematology and Oncology

## 2021-01-12 ENCOUNTER — Telehealth: Payer: Self-pay | Admitting: Dietician

## 2021-01-12 NOTE — Telephone Encounter (Signed)
Nutrition  Unable to reach patient for nutrition follow-up. Left message with request for return call. Contact information provided.

## 2021-01-12 NOTE — Telephone Encounter (Signed)
Dr Alvy Bimler refused this yesterday. Gardiner Rhyme, RN

## 2021-01-17 ENCOUNTER — Other Ambulatory Visit: Payer: Self-pay | Admitting: Hematology and Oncology

## 2021-01-17 ENCOUNTER — Encounter: Payer: Self-pay | Admitting: Hematology and Oncology

## 2021-01-17 ENCOUNTER — Inpatient Hospital Stay: Payer: Managed Care, Other (non HMO) | Attending: Hematology and Oncology | Admitting: Hematology and Oncology

## 2021-01-17 ENCOUNTER — Other Ambulatory Visit: Payer: Self-pay

## 2021-01-17 DIAGNOSIS — R634 Abnormal weight loss: Secondary | ICD-10-CM | POA: Insufficient documentation

## 2021-01-17 DIAGNOSIS — R918 Other nonspecific abnormal finding of lung field: Secondary | ICD-10-CM | POA: Insufficient documentation

## 2021-01-17 DIAGNOSIS — D509 Iron deficiency anemia, unspecified: Secondary | ICD-10-CM | POA: Insufficient documentation

## 2021-01-17 DIAGNOSIS — Z66 Do not resuscitate: Secondary | ICD-10-CM | POA: Insufficient documentation

## 2021-01-17 DIAGNOSIS — K5909 Other constipation: Secondary | ICD-10-CM | POA: Insufficient documentation

## 2021-01-17 DIAGNOSIS — C01 Malignant neoplasm of base of tongue: Secondary | ICD-10-CM | POA: Diagnosis not present

## 2021-01-17 DIAGNOSIS — E43 Unspecified severe protein-calorie malnutrition: Secondary | ICD-10-CM

## 2021-01-17 DIAGNOSIS — I2510 Atherosclerotic heart disease of native coronary artery without angina pectoris: Secondary | ICD-10-CM | POA: Insufficient documentation

## 2021-01-17 DIAGNOSIS — G893 Neoplasm related pain (acute) (chronic): Secondary | ICD-10-CM | POA: Diagnosis not present

## 2021-01-17 DIAGNOSIS — C7951 Secondary malignant neoplasm of bone: Secondary | ICD-10-CM | POA: Insufficient documentation

## 2021-01-17 DIAGNOSIS — I2699 Other pulmonary embolism without acute cor pulmonale: Secondary | ICD-10-CM | POA: Insufficient documentation

## 2021-01-17 DIAGNOSIS — R630 Anorexia: Secondary | ICD-10-CM | POA: Insufficient documentation

## 2021-01-17 DIAGNOSIS — Z7901 Long term (current) use of anticoagulants: Secondary | ICD-10-CM | POA: Insufficient documentation

## 2021-01-17 DIAGNOSIS — Z79899 Other long term (current) drug therapy: Secondary | ICD-10-CM | POA: Insufficient documentation

## 2021-01-17 DIAGNOSIS — Z923 Personal history of irradiation: Secondary | ICD-10-CM | POA: Insufficient documentation

## 2021-01-17 DIAGNOSIS — I700 Atherosclerosis of aorta: Secondary | ICD-10-CM | POA: Insufficient documentation

## 2021-01-17 DIAGNOSIS — D649 Anemia, unspecified: Secondary | ICD-10-CM | POA: Insufficient documentation

## 2021-01-17 MED ORDER — APIXABAN 5 MG PO TABS: 5.0000 mg | ORAL_TABLET | Freq: Two times a day (BID) | ORAL | 11 refills | Status: AC

## 2021-01-17 NOTE — Assessment & Plan Note (Signed)
He is currently being evaluated at [name] for possible enrollment in clinical trial I will see him back in 3 weeks for further supportive care here locally

## 2021-01-17 NOTE — Assessment & Plan Note (Signed)
He has better pain control with the addition of MS Contin He will continue current prescribed pain medicine

## 2021-01-17 NOTE — Progress Notes (Signed)
HEMATOLOGY-ONCOLOGY ELECTRONIC VISIT PROGRESS NOTE  Patient Care Team: Benay Pike, MD as PCP - General (Hematology and Oncology) [name], MD as Consulting Physician (Otolaryngology) [name], MD as Attending Physician (Radiation Oncology) Leota Sauers, RN (Inactive) as Registered Nurse (Oncology) Wynelle Beckmann, Melodie Bouillon, PT as Physical Therapist (Physical Therapy) Sharen Counter, CCC-SLP as Speech Language Pathologist (Speech Pathology) Karie Mainland, RD as Dietitian (Nutrition) Malmfelt, Stephani Police, RN as Oncology Nurse Navigator (Oncology)  I connected with the patient via telephone conference and verified that I am speaking with the correct person using two identifiers. The patient's location is in [place] and I am providing care from the [facility] I discussed the limitations, risks, security and privacy concerns of performing an evaluation and management service by e-visits and the availability of in person appointments.  I also discussed with the patient that there may be a patient responsible charge related to this service. The patient expressed understanding and agreed to proceed.   ASSESSMENT & PLAN:  Malignant neoplasm of base of tongue ([place]) He is currently being evaluated at [name] for possible enrollment in clinical trial I will see him back in 3 weeks for further supportive care here locally  Cancer related pain He has better pain control with the addition of MS Contin He will continue current prescribed pain medicine  Protein-calorie malnutrition, severe He continues to struggle with oral intake I recommend increasing Megace to 80 mg twice daily and to take additional liquid supplement after each meal  No orders of the defined types were placed in this encounter.   INTERVAL HISTORY: Please see below for problem oriented charting. The purpose of today's discussion is further supportive care and review of recent medical changes and pain  management He felt excellent pain control with the addition of MS Contin On average, he only needed oxycodone twice daily for breakthrough pain and takes MS Contin at nighttime His sleep well He is only able to consume approximately 1200 cal due to lack of appetite and taking too much time chewing food No dysphagia Denies constipation Denies nausea  SUMMARY OF ONCOLOGIC HISTORY: Oncology History  Malignant neoplasm of base of tongue ([place])  04/24/2018 Imaging   CT neck w/ contrast: 1. Right level 2 necrotic nodal mass and level 3 necrotic lymph node are highly concerning for nodal metastasis from head and neck primary neoplasm likely oropharynx or the oral cavity. However, evaluation for primary neoplasm in these regions is markedly limited due to streak artifacts from dental amalgam and closed airway possibly secondary to patient's holding breath/swallowing. Recommend direct visual inspection and possibly PET CT scan for further evaluation.  2. Mild asymmetry and fullness of the right nasopharyngeal region, nonspecific.  3. 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   05/07/2018 Procedure   FNA of the right cervical LN   05/07/2018 Pathology Results   ACCESSION NUMBER: [id number]  Right IJ chain lymph node, FNA Squamous cell carcinoma, metastatic; p16+    05/28/2018 Imaging   PET: IMPRESSION: 1. Asymmetric hypermetabolic activity in the RIGHT lingular tonsil/base of tongue region favored primary carcinoma. 2. Two hypermetabolic RIGHT level II metastatic lymph nodes. 3. No LEFT cervical lymphadenopathy.  No distant metastatic disease.   06/02/2018 Initial Diagnosis   In summary, patient presents with Dr. Constance Holster of ENT in late 03/2018 for evaluation of an enlarging right neck mass.  CT neck showed necrotic right Level II and III necrotic LN's, possibly from oropharyngeal oral cavity  primary, but the study was limited due to streak artifacts.  He underwent FNA of the R Level II  LN, which showed squamous cell carcinoma, p16+.  PET in 05/2018 showed asymmetric FDG uptake in the right tonsil/base of the tongue, likely the primary malignancy.  In addition, there were two FDG-avid R Level II LN's without evidence of contralateral cervical LN or metastatic disease.  Given the relatively low volume disease, Dr Maylon Peppers recommended upfront surgery, followed by adjuvant therapy based on the final pathology. He had TORS on 06/16/2018.  Pathology from the procedure showed: invasive p16-positive (HPV-related) squamous cell carcinoma, 1.4 cm, negative margins. Out of 24 biopsied lymph nodes, only one was positive for metastatic carcinoma (1/24).  This node was 4.5cm but with no extracapsular extension.  No LVSI and no PNI.  He underwent right neck dissection but not left neck dissection.  At postop follow up, they agreed to proceed with observation and reserve radiation therapy for any possible future recurrence.   At follow up on 04/07/2019, Dr. [name] noted a new suspicious left upper neck lymph node. Neck CT performed that day revealed: new heterogeneous 2.2 cm contralateral left level 2a lymph node suspicious for contralateral nodal metastasis; indeterminate mild interval increase in size of several left level 2a and 2b lymph nodes.   PET scan performed on 04/16/2019 showed: single hypermetabolic left cervical lymph node; mild asymmetric FDG uptake in left glossotonsillar sulcus; focal FDG uptake in L1 vertebral body without CT correlate; otherwise, no malignant-range FDG uptake elsewhere.  He underwent left tonsillectomy and left neck dissection on 05/04/2019 with pathology revealing: benign left tonsil. Out of a total of 25 biopsied lymph nodes, only one was positive for metastatic carcinoma but negative for extracapsular extension and positive for p16.  He was advised adjuvant radiation at this time. He completed radiation on 07/24/2019.   05/29/2019 Cancer Staging   Staging form: Pharynx  - HPV-Mediated Oropharynx, AJCC 8th Edition - Pathologic stage from 05/29/2019: Stage I (rpT0, pN1, cM0, p16+) - Signed by [name], MD on 05/29/2019    11/07/2019 Imaging   1. Patchy ground-glass predominantly at the LEFT lung base, mild bronchial wall thickening in areas subtending affected areas of lung with ground-glass nodule in the RIGHT chest without bronchial wall thickening. Findings may be indicative of pneumonitis, perhaps from aspiration in this patient with head and neck cancer. Suggest short interval follow-up to ensure resolution, within 8-12 weeks. 2. Stranding in the low neck better demonstrated on the neck CT acquired on the same date compatible with post treatment changes, incompletely imaged. 3. Calcified granulomatous changes in the RIGHT hilum and RIGHT middle lobe. 4. Aortic atherosclerosis.   Aortic Atherosclerosis (ICD10-I70.0).   02/17/2020 Imaging   1. Near complete resolution of ground-glass nodule in the lower lobes consistent resolving infectious or inflammatory process. 2. Single small new pulmonary nodule in the LEFT lower lobe. Recommend follow-up CT in 3 to 6 months.   05/04/2020 PET scan   1. Widespread hypermetabolic bone metastases. 2. 3.5 cm necrotic hypermetabolic metastatic lesion in the right liver. 3. Hypermetabolic uptake in the right hilum associated with 2 hypermetabolic left lung nodules. Imaging appearance is concerning for metastatic disease. 4. Patchy/nodular ground-glass opacity in the right lung having a tree-in-bud configuration. This would be an atypical appearance for metastatic disease and infectious etiology is favored, potentially atypical. 5.  Aortic Atherosclerois (ICD10-170.0)   05/11/2020 - 05/30/2020 Radiation Therapy   Radiation Treatment Dates: 05/11/2020 through 05/30/2020 Site Technique Total Dose (  Gy) Dose per Fx (Gy) Completed Fx Beam Energies  T_L_S Spine_+L_hip 3D 35 2.5 14 10X, 15X     05/12/2020 Pathology Results   A.  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  - See comment.   COMMENT:  The carcinoma with immunohistochemistry is positive with cytokeratin 5/6, p63 and p40 and shows diffuse strong positivity with p16.  The carcinoma is negative with CD56, chromogranin and synaptophysin.  The  immunophenotype is consistent with squamous cell carcinoma   06/27/2020 - 12/07/2020 Chemotherapy   Patient is on Treatment Plan : HEAD/NECK Pembrolizumab + Carboplatin + 5FU q21d x 6 cycles / Pembrolizumab q21d      08/28/2020 - 08/30/2020 Hospital Admission   He was hospitalized for COVID-19 infection   09/15/2020 PET scan   1. Mixed response to therapy. 2. Marked improvement in skeletal metastasis with near resolution of metabolic activity of previous multifocal skeletal metastasis. Sclerotic lesions remain in the underlying bone. 3. Marked improvement in solitary hypermetabolic hepatic metastasis reduced in metabolic activity and size. 4. Two new lesions in the RIGHT lung which are hypermetabolic. One RIGHT upper lobe nodule and a new focus of hypermetabolic nodular consolidation in the RIGHT infrahilar lower lobe. 5. Improvement in ground-glass nodularity in the RIGHT upper lobe suggest resolved pulmonary infection.     09/17/2020 Imaging   1. No evidence of pulmonary emboli. 2. Rapid increase in large RIGHT LOWER lobe masslike consolidation since 30/11/2328 almost certainly related to infection/pneumonia given significant change over 2 days. Component of underlying malignancy is not excluded however. New small RIGHT pleural effusion. 3. Unchanged 12 mm RIGHT middle lobe nodule and sclerotic lesions within bilateral ribs, proximal RIGHT humerus, sternum and thoracic spine compatible with metastatic disease.   09/17/2020 - 09/23/2020 Hospital Admission   He was hospitalized for community-acquired pneumonia   10/02/2020 - 10/07/2020 Hospital Admission   -Patient  presents to ED with complaints of chest pain, findings significant for worsening pneumonia despite being on appropriate antibiotics, as well as significant for pulmonary nodule for which she was seen by pulmonary.  He completed course of IV antibiotics.  Hospitalization complicated by uncontrolled type 2 diabetes with hyperglycemia.  Was switched to metformin and glipizide with improvement of his hyperglycemia.   10/03/2020 Imaging   No evidence of pulmonary embolism.   Patchy right lower lobe opacity remains suspicious for pneumonia. Additional mild right upper lobe infection/pneumonia. Small bilateral pleural effusions, progressive on the left.   Two right lung nodules are mildly progressive from recent CT, suspicious for metastatic disease.   Mildly progressive soft tissue component associated with left anterior 4th rib metastasis. Additional multifocal osseous metastases are unchanged.   10/26/2020 Imaging   1. Mildly motion degraded exam. 2. Large volume left-sided pulmonary embolism with evidence of right heart strain. (RV/LV Ratio = 1.2) consistent with at least submassive (intermediate risk) PE. The presence of right heart strain has been associated with an increased risk of morbidity and mortality. Please refer to the "PE Focused" order set in EPIC. 3. Shifting right-sided pulmonary opacities, most consistent with infection or aspiration. 4. Pulmonary nodules, felt to be slightly progressive and indicative of metastatic disease. 5. Widespread osseous metastasis, mildly progressive as above. 6. Coronary artery atherosclerosis. Aortic Atherosclerosis (ICD10-I70.0).     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  Patient was started on IV heparin.  Pulmonology and IR consulted and did not feel thrombectomy/thrombolysis was indicated.  Hematology recommended discharge on subcu Lovenox with a plan to transition to [facility] outpatient.   12/23/2020 PET scan   IMPRESSION: 1. Considerable progression multi organ hypermetabolic metastasis. 2. Interval increase in size and new RIGHT lung pulmonary metastasis and LEFT hilar metastasis. 3. New and increased RIGHT hepatic lobe metastasis. 4. New and increased skeletal metastasis with soft tissue expansion of several lesions.   01/11/2021 Cancer Staging   Staging form: Pharynx - HPV-Mediated Oropharynx, AJCC 8th Edition - Clinical stage from 01/11/2021: Stage IV (rcT2, cN1, pM1, p16+) - Signed by Heath Lark, MD on 01/11/2021 Stage prefix: Recurrence Laterality: Right      REVIEW OF SYSTEMS:   Constitutional: Denies fevers, chills or abnormal weight loss Eyes: Denies blurriness of vision Ears, nose, mouth, throat, and face: Denies mucositis or sore throat Respiratory: Denies cough, dyspnea or wheezes Cardiovascular: Denies palpitation, chest discomfort Gastrointestinal:  Denies nausea, heartburn or change in bowel habits Skin: Denies abnormal skin rashes Lymphatics: Denies new lymphadenopathy or easy bruising Neurological:Denies numbness, tingling or new weaknesses Behavioral/Psych: Mood is stable, no new changes  Extremities: No lower extremity edema All other systems were reviewed with the patient and are negative.  I have reviewed the past medical history, past surgical history, social history and family history with the patient and they are unchanged from previous note.  ALLERGIES:  is allergic to penicillins.  MEDICATIONS:  Current Outpatient  Medications  Medication Sig Dispense Refill   cholecalciferol (VITAMIN D3) 25 MCG (1000 UNIT) tablet Take 2,000 Units by mouth daily.     ELIQUIS 5 MG TABS tablet TAKE 1 TABLET BY MOUTH TWICE A DAY 60 tablet 0   megestrol (MEGACE) 40 MG tablet Take 1 tablet (40 mg total) by mouth 2 (two) times daily. 60 tablet 3   mirtazapine (REMERON) 15 MG tablet TAKE 2 TABLETS BY MOUTH AT BEDTIME 60 tablet 0   morphine (MS CONTIN) 15 MG 12 hr tablet Take 1 tablet (15 mg total) by mouth every 12 (twelve) hours. 60 tablet 0   ondansetron (ZOFRAN ODT) 8 MG disintegrating tablet Take 1 tablet (8 mg total) by mouth every 8 (eight) hours as needed for nausea or vomiting. (Patient taking differently: Take 8 mg by mouth every 8 (eight) hours as needed for nausea or vomiting (dissolve orally).) 30 tablet 3   Oxycodone HCl 10 MG TABS Take 1 tablet (10 mg total) by mouth every 4 (four) hours as needed for severe pain. 90 tablet 0   polyethylene glycol (MIRALAX / GLYCOLAX) 17 g packet Take 17 g by mouth daily.     prochlorperazine (COMPAZINE) 10 MG tablet Take 10 mg by mouth every 6 (six) hours as needed for nausea or vomiting.     senna (SENOKOT) 8.6 MG TABS tablet Take 1 tablet (8.6 mg total) by mouth 2 (two) times daily. 120 tablet 0   No current facility-administered medications for this visit.    PHYSICAL EXAMINATION: ECOG PERFORMANCE STATUS: 2 - Symptomatic, <50% confined to bed  LABORATORY DATA:  I have reviewed the data as listed CMP Latest  Ref Rng & Units 12/28/2020 12/07/2020 11/30/2020  Glucose 70 - 99 mg/dL 65(L) 202(H) 138(H)  BUN 6 - 20 mg/dL 10 6 7   Creatinine 0.61 - 1.24 mg/dL 0.62 0.72 0.71  Sodium 135 - 145 mmol/L 144 137 138  Potassium 3.5 - 5.1 mmol/L 3.6 3.4(L) 3.8  Chloride 98 - 111 mmol/L 109 105 105  CO2 22 - 32 mmol/L 24 22 25   Calcium 8.9 - 10.3 mg/dL 10.3 9.3 9.3  Total Protein 6.5 - 8.1 g/dL 8.1 6.7 6.3(L)  Total Bilirubin 0.3 - 1.2 mg/dL 0.2(L) 0.2(L) 0.3  Alkaline Phos 38 - 126 U/L  93 116 125  AST 15 - 41 U/L 23 17 19   ALT 0 - 44 U/L 14 8 11     Lab Results  Component Value Date   WBC 6.5 12/28/2020   HGB 10.2 (L) 12/28/2020   HCT 30.8 (L) 12/28/2020   MCV 97.2 12/28/2020   PLT 265 12/28/2020   NEUTROABS 4.1 12/28/2020     RADIOGRAPHIC STUDIES: I have personally reviewed the radiological images as listed and agreed with the findings in the report. NM PET Image Restag (PS) Skull Base To Thigh  Result Date: 12/23/2020 CLINICAL DATA:  Subsequent treatment strategy for head neck carcinoma. EXAM: NUCLEAR MEDICINE PET SKULL BASE TO THIGH TECHNIQUE: 6.8 mCi F-18 FDG was injected intravenously. Full-ring PET imaging was performed from the skull base to thigh after the radiotracer. CT data was obtained and used for attenuation correction and anatomic localization. Fasting blood glucose: 85 mg/dl COMPARISON:  PET-CT 09/15/2020 FINDINGS: Mediastinal blood pool activity: SUV max 1.5 Liver activity: SUV max NA NECK: No hypermetabolic lymph nodes in the neck. Incidental CT findings: Port in the anterior chest wall with tip in distal SVC. CHEST: Dominant nodule in the RIGHT middle lobe measures 21 mm increased from 11 mm (image 93/4) has intense metabolic activity with SUV max equal 17.6 increased from 5.9. Hypermetabolic nodule in the LEFT upper lobe measuring 8 mm (image 78) with SUV max equal 5.4 is new from prior. New LEFT hilar lesion measuring 17 mm (image 78/4) has intense metabolic activity SUV max equal 10.8. Incidental CT findings: none ABDOMEN/PELVIS: New small hypermetabolic lesion in the RIGHT hepatic lobe with SUV max equal 6.2 on image 108. There is a subtle hypodensity on the CT noncontrast exam measuring 10 mm. Second lesion along the inferior margin of the RIGHT hepatic lobe measuring 20 mm (126/4) has intense metabolic activity with SUV max equal 11.9. This is increased from a subtle lesion with SUV max equal 3.7 on comparison exam. Diffuse activity associated the colon  is favored benign. Incidental CT findings: none SKELETON: There multiple foci of hypermetabolic skeletal metastasis. Interval soft tissue expansion of metastatic lesion anterior RIGHT rib SUV max equal 12.6 increased from SUV max equal 4.8. This expansile soft tissue component measures 20 mm in depth compared to 10 mm on prior. Increase in activity of a RIGHT humeral head lesion with SUV max equal 12.0 increased from SUV max equal 4.4. There is increased in bone sclerosis at this level. New lesion in the RIGHT iliac wing with SUV max equal 12.6. This is a broad lesion with sclerosis and cortical disruption. Similar lesion in the LEFT iliac wing. Incidental CT findings: none IMPRESSION: 1. Considerable progression multi organ hypermetabolic metastasis. 2. Interval increase in size and new RIGHT lung pulmonary metastasis and LEFT hilar metastasis. 3. New and increased RIGHT hepatic lobe metastasis. 4. New and increased skeletal metastasis with soft  tissue expansion of several lesions. Electronically Signed   By: Suzy Bouchard M.D.   On: 12/23/2020 10:08    I discussed the assessment and treatment plan with the patient. The patient was provided an opportunity to ask questions and all were answered. The patient agreed with the plan and demonstrated an understanding of the instructions. The patient was advised to call back or seek an in-person evaluation if the symptoms worsen or if the condition fails to improve as anticipated.    I spent 20 minutes for the appointment reviewing test results, discuss management and coordination of care.  Heath Lark, MD 01/17/2021 12:59 PM

## 2021-01-17 NOTE — Assessment & Plan Note (Signed)
He continues to struggle with oral intake I recommend increasing Megace to 80 mg twice daily and to take additional liquid supplement after each meal

## 2021-01-18 ENCOUNTER — Inpatient Hospital Stay: Payer: Managed Care, Other (non HMO)

## 2021-01-18 ENCOUNTER — Inpatient Hospital Stay: Payer: Managed Care, Other (non HMO) | Admitting: Nurse Practitioner

## 2021-01-20 ENCOUNTER — Encounter: Payer: Self-pay | Admitting: Hematology and Oncology

## 2021-01-23 ENCOUNTER — Telehealth: Payer: Self-pay

## 2021-01-23 ENCOUNTER — Other Ambulatory Visit: Payer: Self-pay | Admitting: Hematology and Oncology

## 2021-01-23 DIAGNOSIS — C01 Malignant neoplasm of base of tongue: Secondary | ICD-10-CM

## 2021-01-23 DIAGNOSIS — C7951 Secondary malignant neoplasm of bone: Secondary | ICD-10-CM

## 2021-01-23 NOTE — Telephone Encounter (Signed)
I am away on 11/17 and 11/18 I can see him on Monday at 1 pm on 11/21. What is the point of biopsy if he is not enrolled in clinical trial?

## 2021-01-23 NOTE — Telephone Encounter (Signed)
Called back and scheduled appt on 11/21 at 1 pm. They are aware of appt date/time.  Biopsy is to check for genetic markers to see if [facility] Flowers would qualify for a future trail, per wife.

## 2021-01-23 NOTE — Telephone Encounter (Signed)
Called regarding mychart message to clarify. Hgb needs to be 9 for trial at [facility]. Told Dr. Alvy Bimler is a Hematologist. Wife verbalized understanding and would like appt with Dr. Alvy Bimler.  Albert Flowers is scheduled for biopsy on Wednesday and they will come back to [facility] on Tuesday 11/15. They would like appt after they come back to [facility].

## 2021-01-23 NOTE — Telephone Encounter (Signed)
-----   Message from Heath Lark, MD sent at 01/23/2021  8:15 AM EST ----- Pls call his wife to clarify her questions she sent on mychart

## 2021-01-25 ENCOUNTER — Telehealth: Payer: Self-pay | Admitting: *Deleted

## 2021-01-25 ENCOUNTER — Encounter: Payer: Self-pay | Admitting: Hematology and Oncology

## 2021-01-25 NOTE — Telephone Encounter (Signed)
01/24/2021: [place] requests office notes, outline of treatment plan with additional request if Oswell Say able to return to work without limitations.  No form included for provider.    This nurse faxed 01/17/2021, 01/11/2021 note with response patient unable to return to work.

## 2021-01-26 ENCOUNTER — Other Ambulatory Visit: Payer: Self-pay | Admitting: Hematology and Oncology

## 2021-01-26 ENCOUNTER — Encounter: Payer: Self-pay | Admitting: Hematology and Oncology

## 2021-01-26 DIAGNOSIS — C01 Malignant neoplasm of base of tongue: Secondary | ICD-10-CM

## 2021-01-26 NOTE — Telephone Encounter (Signed)
Contacted patient's wife by phone. Per Dr. Alvy Bimler, she can meet with patient next week on Wednesday when they are back from [place] to discuss treatment and plan. Ms. Pfalzgraf in agreement. Per Dr. Alvy Bimler, if agreeable to patient/wife, they can come at [place] Wednesday morning. They are in agreement. Appointment scheduled.

## 2021-01-26 NOTE — Progress Notes (Signed)
DISCONTINUE ON PATHWAY REGIMEN - Head and Neck     A cycle is every 21 days:     Carboplatin      Fluorouracil      Pembrolizumab   **Always confirm dose/schedule in your pharmacy ordering system**  REASON: Disease Progression PRIOR TREATMENT: [id number]: Pembrolizumab 200 mg D1 + Carboplatin AUC=5 D1 + Fluorouracil 1,000 mg/m2/day CIV D1-4 q21 Days x 6 Cycles, then Pembrolizumab 200 mg q21 Days for up to a total of 24 Months TREATMENT RESPONSE: Progressive Disease (PD)  START ON PATHWAY REGIMEN - Head and Neck     A cycle is every 14 days:     Cetuximab   **Always confirm dose/schedule in your pharmacy ordering system**  Patient Characteristics: Oropharynx, HPV Positive, Metastatic, 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

## 2021-02-01 ENCOUNTER — Inpatient Hospital Stay: Payer: Managed Care, Other (non HMO)

## 2021-02-01 ENCOUNTER — Other Ambulatory Visit: Payer: Self-pay

## 2021-02-01 ENCOUNTER — Other Ambulatory Visit: Payer: Self-pay | Admitting: Hematology and Oncology

## 2021-02-01 ENCOUNTER — Other Ambulatory Visit (HOSPITAL_COMMUNITY): Payer: Self-pay

## 2021-02-01 ENCOUNTER — Inpatient Hospital Stay (HOSPITAL_BASED_OUTPATIENT_CLINIC_OR_DEPARTMENT_OTHER): Payer: Managed Care, Other (non HMO) | Admitting: Hematology and Oncology

## 2021-02-01 VITALS — BP 99/72 | HR 112 | Temp 99.2°F | Resp 18 | Ht 69.0 in | Wt 133.6 lb

## 2021-02-01 DIAGNOSIS — Z66 Do not resuscitate: Secondary | ICD-10-CM | POA: Diagnosis not present

## 2021-02-01 DIAGNOSIS — R634 Abnormal weight loss: Secondary | ICD-10-CM | POA: Diagnosis not present

## 2021-02-01 DIAGNOSIS — Z79899 Other long term (current) drug therapy: Secondary | ICD-10-CM | POA: Diagnosis not present

## 2021-02-01 DIAGNOSIS — R918 Other nonspecific abnormal finding of lung field: Secondary | ICD-10-CM | POA: Diagnosis not present

## 2021-02-01 DIAGNOSIS — K5909 Other constipation: Secondary | ICD-10-CM | POA: Diagnosis not present

## 2021-02-01 DIAGNOSIS — I2510 Atherosclerotic heart disease of native coronary artery without angina pectoris: Secondary | ICD-10-CM | POA: Diagnosis not present

## 2021-02-01 DIAGNOSIS — D6481 Anemia due to antineoplastic chemotherapy: Secondary | ICD-10-CM | POA: Diagnosis not present

## 2021-02-01 DIAGNOSIS — D649 Anemia, unspecified: Secondary | ICD-10-CM | POA: Diagnosis not present

## 2021-02-01 DIAGNOSIS — I2699 Other pulmonary embolism without acute cor pulmonale: Secondary | ICD-10-CM | POA: Diagnosis not present

## 2021-02-01 DIAGNOSIS — T451X5A Adverse effect of antineoplastic and immunosuppressive drugs, initial encounter: Secondary | ICD-10-CM

## 2021-02-01 DIAGNOSIS — G893 Neoplasm related pain (acute) (chronic): Secondary | ICD-10-CM

## 2021-02-01 DIAGNOSIS — C7951 Secondary malignant neoplasm of bone: Secondary | ICD-10-CM

## 2021-02-01 DIAGNOSIS — R630 Anorexia: Secondary | ICD-10-CM | POA: Diagnosis not present

## 2021-02-01 DIAGNOSIS — I700 Atherosclerosis of aorta: Secondary | ICD-10-CM | POA: Diagnosis not present

## 2021-02-01 DIAGNOSIS — C01 Malignant neoplasm of base of tongue: Secondary | ICD-10-CM

## 2021-02-01 DIAGNOSIS — Z923 Personal history of irradiation: Secondary | ICD-10-CM | POA: Diagnosis not present

## 2021-02-01 DIAGNOSIS — D509 Iron deficiency anemia, unspecified: Secondary | ICD-10-CM | POA: Insufficient documentation

## 2021-02-01 DIAGNOSIS — E039 Hypothyroidism, unspecified: Secondary | ICD-10-CM

## 2021-02-01 DIAGNOSIS — Z7901 Long term (current) use of anticoagulants: Secondary | ICD-10-CM | POA: Diagnosis not present

## 2021-02-01 LAB — COMPREHENSIVE METABOLIC PANEL
ALT: 12 U/L (ref 0–44)
AST: 38 U/L (ref 15–41)
Albumin: 3 g/dL — ABNORMAL LOW (ref 3.5–5.0)
Alkaline Phosphatase: 158 U/L — ABNORMAL HIGH (ref 38–126)
Anion gap: 10 (ref 5–15)
BUN: 10 mg/dL (ref 6–20)
CO2: 26 mmol/L (ref 22–32)
Calcium: 9.5 mg/dL (ref 8.9–10.3)
Chloride: 95 mmol/L — ABNORMAL LOW (ref 98–111)
Creatinine, Ser: 0.77 mg/dL (ref 0.61–1.24)
GFR, Estimated: >60 mL/min (ref >60)
Glucose, Bld: 178 mg/dL — ABNORMAL HIGH (ref 70–99)
Potassium: 4.1 mmol/L (ref 3.5–5.1)
Sodium: 131 mmol/L — ABNORMAL LOW (ref 135–145)
Total Bilirubin: 0.3 mg/dL (ref 0.3–1.2)
Total Protein: 7.5 g/dL (ref 6.5–8.1)

## 2021-02-01 LAB — RETICULOCYTES
Immature Retic Fract: 34.8 % — ABNORMAL HIGH (ref 2.3–15.9)
RBC.: 2.33 MIL/uL — ABNORMAL LOW (ref 4.22–5.81)
Retic Count, Absolute: 61 10*3/uL (ref 19.0–186.0)
Retic Ct Pct: 2.6 % (ref 0.4–3.1)

## 2021-02-01 LAB — MAGNESIUM: Magnesium: 1.8 mg/dL (ref 1.7–2.4)

## 2021-02-01 LAB — CBC WITH DIFFERENTIAL/PLATELET
Abs Immature Granulocytes: 0.31 10*3/uL — ABNORMAL HIGH (ref 0.00–0.07)
Basophils Absolute: 0 10*3/uL (ref 0.0–0.1)
Basophils Relative: 0 %
Eosinophils Absolute: 0.1 10*3/uL (ref 0.0–0.5)
Eosinophils Relative: 1 %
HCT: 23.9 % — ABNORMAL LOW (ref 39.0–52.0)
Hemoglobin: 7.5 g/dL — ABNORMAL LOW (ref 13.0–17.0)
Immature Granulocytes: 5 %
Lymphocytes Relative: 4 %
Lymphs Abs: 0.3 10*3/uL — ABNORMAL LOW (ref 0.7–4.0)
MCH: 31.1 pg (ref 26.0–34.0)
MCHC: 31.4 g/dL (ref 30.0–36.0)
MCV: 99.2 fL (ref 80.0–100.0)
Monocytes Absolute: 0.8 10*3/uL (ref 0.1–1.0)
Monocytes Relative: 12 %
Neutro Abs: 5.4 10*3/uL (ref 1.7–7.7)
Neutrophils Relative %: 78 %
Platelets: 232 10*3/uL (ref 150–400)
RBC: 2.41 MIL/uL — ABNORMAL LOW (ref 4.22–5.81)
RDW: 15.7 % — ABNORMAL HIGH (ref 11.5–15.5)
WBC: 6.9 10*3/uL (ref 4.0–10.5)
nRBC: 0 % (ref 0.0–0.2)

## 2021-02-01 LAB — IRON AND TIBC
Iron: 30 ug/dL — ABNORMAL LOW (ref 42–163)
Saturation Ratios: 18 % — ABNORMAL LOW (ref 20–55)
TIBC: 170 ug/dL — ABNORMAL LOW (ref 202–409)
UIBC: 140 ug/dL (ref 117–376)

## 2021-02-01 LAB — VITAMIN B12: Vitamin B-12: 2901 pg/mL — ABNORMAL HIGH (ref 180–914)

## 2021-02-01 LAB — SAMPLE TO BLOOD BANK

## 2021-02-01 LAB — FERRITIN: Ferritin: 2450 ng/mL — ABNORMAL HIGH (ref 24–336)

## 2021-02-01 LAB — TSH: TSH: 3.781 u[IU]/mL (ref 0.320–4.118)

## 2021-02-01 MED ORDER — DEXAMETHASONE 4 MG PO TABS
4.0000 mg | ORAL_TABLET | Freq: Every day | ORAL | 1 refills | Status: DC
  Filled 2021-02-01: qty 30, 30d supply, fill #0

## 2021-02-01 MED ORDER — OXYCODONE HCL 30 MG PO TABS
30.0000 mg | ORAL_TABLET | ORAL | 0 refills | Status: AC | PRN
  Filled 2021-02-01: qty 60, 10d supply, fill #0

## 2021-02-01 NOTE — Assessment & Plan Note (Signed)
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 The patient is symptomatic from anemia and the hemoglobin level is critically low.  Some of the side-effects to be expected including risks of transfusion reactions, chills, infection, syndrome of volume overload and risk of hospitalization from various reasons and the patient is willing to proceed and went ahead to sign consent today. I will also get insurance prior authorization for IV iron next week

## 2021-02-01 NOTE — Assessment & Plan Note (Signed)
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

## 2021-02-01 NOTE — Assessment & Plan Note (Addendum)
He continues to have poorly controlled pain I recommend the patient to take MS Contin twice daily along with oxycodone at increased dose as needed We will reassess pain control next week I warned him about risk of constipation I recommend addition of daily dexamethasone to control pain and to improve appetite

## 2021-02-01 NOTE — Assessment & Plan Note (Signed)
We discussed the importance of aggressive laxative therapy 

## 2021-02-01 NOTE — Progress Notes (Signed)
[facility] OFFICE PROGRESS NOTE  Patient Care Team: Benay Pike, MD as PCP - General (Hematology and Oncology) [name], MD as Consulting Physician (Otolaryngology) [name], MD as Attending Physician (Radiation Oncology) Leota Sauers, RN (Inactive) as Registered Nurse (Oncology) Wynelle Beckmann, Melodie Bouillon, PT as Physical Therapist (Physical Therapy) Sharen Counter, CCC-SLP as Speech Language Pathologist (Speech Pathology) Karie Mainland, RD as Dietitian (Nutrition) Malmfelt, Stephani Police, RN as Oncology Nurse Navigator (Oncology)  ASSESSMENT & PLAN:  Malignant neoplasm of base of tongue ([place]) I have reviewed multiple documentation from [name] The patient was excluded from clinical trial due to severe anemia Repeat blood count today show progressive anemia which I suspect is due to bone marrow replacement We had a long discussion about next step, treatment options and prognosis After much agreement, I have discontinue treatment plan for cetuximab due to poor response rate expected We discussed the risk, benefits, side effects of single agent cisplatin, single agent paclitaxel or docetaxel That has to be balanced with quality of life and risk of side effects The patient is undecided We will keep appointment as scheduled tomorrow for blood transfusion support and appointment next week for further discussion about next step  Cancer related pain He continues to have poorly controlled pain I recommend the patient to take MS Contin twice daily along with oxycodone at increased dose as needed We will reassess pain control next week I warned him about risk of constipation I recommend addition of daily dexamethasone to control pain and to improve appetite   Iron deficiency anemia The cause of anemia is multifactorial There is component of anemia secondary to bone marrow replacement and iron deficiency We will proceed with blood transfusion tomorrow We  discussed some of the risks, benefits, and alternatives of blood transfusions. The patient is symptomatic from anemia and the hemoglobin level is critically low.  Some of the side-effects to be expected including risks of transfusion reactions, chills, infection, syndrome of volume overload and risk of hospitalization from various reasons and the patient is willing to proceed and went ahead to sign consent today. I will also get insurance prior authorization for IV iron next week  Other constipation We discussed the importance of aggressive laxative therapy  Orders Placed This Encounter  Procedures   Iron and TIBC    Standing Status:   Future    Number of Occurrences:   1    Standing Expiration Date:   02/01/2022   Ferritin    Standing Status:   Future    Number of Occurrences:   1    Standing Expiration Date:   02/01/2022   Erythropoietin    Standing Status:   Future    Number of Occurrences:   1    Standing Expiration Date:   02/01/2022   Reticulocytes    Standing Status:   Future    Number of Occurrences:   1    Standing Expiration Date:   02/01/2022   Vitamin B12    Standing Status:   Future    Number of Occurrences:   1    Standing Expiration Date:   02/01/2022   Care order/instruction    Transfuse Parameters    Standing Status:   Future    Standing Expiration Date:   02/01/2022   Informed Consent Details: Physician/Practitioner Attestation; Transcribe to consent form and obtain patient signature    Standing Status:   Future    Standing Expiration Date:   02/01/2022  Order Specific Question:   Physician/Practitioner attestation of informed consent for blood and or blood product transfusion    Answer:   I, the physician/practitioner, attest that I have discussed with the patient the benefits, risks, side effects, alternatives, likelihood of achieving goals and potential problems during recovery for the procedure that I have provided informed consent.    Order Specific  Question:   Product(s)    Answer:   All Product(s)   Sample to Blood Bank    Standing Status:   Standing    Number of Occurrences:   33    Standing Expiration Date:   02/01/2022   Type and screen         Standing Status:   Future    Standing Expiration Date:   02/01/2022    All questions were answered. The patient knows to call the clinic with any problems, questions or concerns. The total time spent in the appointment was 80 minutes encounter with patients including review of chart and various tests results, discussions about plan of care and coordination of care plan   Heath Lark, MD 02/01/2021 12:50 PM  INTERVAL HISTORY: Please see below for problem oriented charting. he returns for further discussion for plan of care after his return visit from [facility] The patient was excluded from clinical trial due to severe, progressive anemia He has slight worsening point in the left groin He had recent changes in bowel habits when he started to take oral iron supplement, originally with diarrhea and then now with constipation He has fatigue He has poor appetite We have extensive discussion about tests performed at [name] and next step We also discussed prognosis today  REVIEW OF SYSTEMS:   Constitutional: Denies fevers, chills or abnormal weight loss Eyes: Denies blurriness of vision Ears, nose, mouth, throat, and face: Denies mucositis or sore throat Respiratory: Denies cough, dyspnea or wheezes Cardiovascular: Denies palpitation, chest discomfort or lower extremity swelling Skin: Denies abnormal skin rashes Lymphatics: Denies new lymphadenopathy or easy bruising Neurological:Denies numbness, tingling or new weaknesses Behavioral/Psych: Mood is stable, no new changes  All other systems were reviewed with the patient and are negative.  I have reviewed the past medical history, past surgical history, social history and family history with the patient and they are  unchanged from previous note.  ALLERGIES:  is allergic to penicillins.  MEDICATIONS:  Current Outpatient Medications  Medication Sig Dispense Refill   dexamethasone (DECADRON) 4 MG tablet Take 1 tablet (4 mg total) by mouth daily. 30 tablet 1   oxycodone (ROXICODONE) 30 MG immediate release tablet Take 1 tablet (30 mg total) by mouth every 4 (four) hours as needed for severe pain. 60 tablet 0   apixaban (ELIQUIS) 5 MG TABS tablet Take 1 tablet (5 mg total) by mouth 2 (two) times daily. 60 tablet 11   cholecalciferol (VITAMIN D3) 25 MCG (1000 UNIT) tablet Take 2,000 Units by mouth daily.     megestrol (MEGACE) 40 MG tablet Take 1 tablet (40 mg total) by mouth 2 (two) times daily. 60 tablet 3   mirtazapine (REMERON) 15 MG tablet TAKE 2 TABLETS BY MOUTH AT BEDTIME 60 tablet 0   morphine (MS CONTIN) 15 MG 12 hr tablet Take 1 tablet (15 mg total) by mouth every 12 (twelve) hours. 60 tablet 0   ondansetron (ZOFRAN ODT) 8 MG disintegrating tablet Take 1 tablet (8 mg total) by mouth every 8 (eight) hours as needed for nausea or vomiting. (Patient taking differently:  Take 8 mg by mouth every 8 (eight) hours as needed for nausea or vomiting (dissolve orally).) 30 tablet 3   polyethylene glycol (MIRALAX / GLYCOLAX) 17 g packet Take 17 g by mouth daily.     prochlorperazine (COMPAZINE) 10 MG tablet Take 10 mg by mouth every 6 (six) hours as needed for nausea or vomiting.     senna (SENOKOT) 8.6 MG TABS tablet Take 1 tablet (8.6 mg total) by mouth 2 (two) times daily. 120 tablet 0   No current facility-administered medications for this visit.    SUMMARY OF ONCOLOGIC HISTORY: Oncology History  Malignant neoplasm of base of tongue ([place])  04/24/2018 Imaging   CT neck w/ contrast: 1. Right level 2 necrotic nodal mass and level 3 necrotic lymph node are highly concerning for nodal metastasis from head and neck primary neoplasm likely oropharynx or the oral cavity. However, evaluation for primary neoplasm in  these regions is markedly limited due to streak artifacts from dental amalgam and closed airway possibly secondary to patient's holding breath/swallowing. Recommend direct visual inspection and possibly PET CT scan for further evaluation.  2. Mild asymmetry and fullness of the right nasopharyngeal region, nonspecific.  3. 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   05/07/2018 Procedure   FNA of the right cervical LN   05/07/2018 Pathology Results   ACCESSION NUMBER: [id number]  Right IJ chain lymph node, FNA Squamous cell carcinoma, metastatic; p16+    05/28/2018 Imaging   PET: IMPRESSION: 1. Asymmetric hypermetabolic activity in the RIGHT lingular tonsil/base of tongue region favored primary carcinoma. 2. Two hypermetabolic RIGHT level II metastatic lymph nodes. 3. No LEFT cervical lymphadenopathy.  No distant metastatic disease.   06/02/2018 Initial Diagnosis   In summary, patient presents with Dr. Constance Holster of ENT in late 03/2018 for evaluation of an enlarging right neck mass.  CT neck showed necrotic right Level II and III necrotic LN's, possibly from oropharyngeal oral cavity primary, but the study was limited due to streak artifacts.  He underwent FNA of the R Level II LN, which showed squamous cell carcinoma, p16+.  PET in 05/2018 showed asymmetric FDG uptake in the right tonsil/base of the tongue, likely the primary malignancy.  In addition, there were two FDG-avid R Level II LN's without evidence of contralateral cervical LN or metastatic disease.  Given the relatively low volume disease, Dr Maylon Peppers recommended upfront surgery, followed by adjuvant therapy based on the final pathology. He had TORS on 06/16/2018.  Pathology from the procedure showed: invasive p16-positive (HPV-related) squamous cell carcinoma, 1.4 cm, negative margins. Out of 24 biopsied lymph nodes, only one was positive for metastatic carcinoma (1/24).  This node was 4.5cm but with no extracapsular extension.   No LVSI and no PNI.  He underwent right neck dissection but not left neck dissection.  At postop follow up, they agreed to proceed with observation and reserve radiation therapy for any possible future recurrence.   At follow up on 04/07/2019, Dr. [name] noted a new suspicious left upper neck lymph node. Neck CT performed that day revealed: new heterogeneous 2.2 cm contralateral left level 2a lymph node suspicious for contralateral nodal metastasis; indeterminate mild interval increase in size of several left level 2a and 2b lymph nodes.   PET scan performed on 04/16/2019 showed: single hypermetabolic left cervical lymph node; mild asymmetric FDG uptake in left glossotonsillar sulcus; focal FDG uptake in L1 vertebral body without CT correlate; otherwise, no malignant-range FDG uptake elsewhere.  He underwent  left tonsillectomy and left neck dissection on 05/04/2019 with pathology revealing: benign left tonsil. Out of a total of 25 biopsied lymph nodes, only one was positive for metastatic carcinoma but negative for extracapsular extension and positive for p16.  He was advised adjuvant radiation at this time. He completed radiation on 07/24/2019.   05/29/2019 Cancer Staging   Staging form: Pharynx - HPV-Mediated Oropharynx, AJCC 8th Edition - Pathologic stage from 05/29/2019: Stage I (rpT0, pN1, cM0, p16+) - Signed by [name], MD on 05/29/2019    11/07/2019 Imaging   1. Patchy ground-glass predominantly at the LEFT lung base, mild bronchial wall thickening in areas subtending affected areas of lung with ground-glass nodule in the RIGHT chest without bronchial wall thickening. Findings may be indicative of pneumonitis, perhaps from aspiration in this patient with head and neck cancer. Suggest short interval follow-up to ensure resolution, within 8-12 weeks. 2. Stranding in the low neck better demonstrated on the neck CT acquired on the same date compatible with post treatment changes, incompletely  imaged. 3. Calcified granulomatous changes in the RIGHT hilum and RIGHT middle lobe. 4. Aortic atherosclerosis.   Aortic Atherosclerosis (ICD10-I70.0).   02/17/2020 Imaging   1. Near complete resolution of ground-glass nodule in the lower lobes consistent resolving infectious or inflammatory process. 2. Single small new pulmonary nodule in the LEFT lower lobe. Recommend follow-up CT in 3 to 6 months.   05/04/2020 PET scan   1. Widespread hypermetabolic bone metastases. 2. 3.5 cm necrotic hypermetabolic metastatic lesion in the right liver. 3. Hypermetabolic uptake in the right hilum associated with 2 hypermetabolic left lung nodules. Imaging appearance is concerning for metastatic disease. 4. Patchy/nodular ground-glass opacity in the right lung having a tree-in-bud configuration. This would be an atypical appearance for metastatic disease and infectious etiology is favored, potentially atypical. 5.  Aortic Atherosclerois (ICD10-170.0)   05/11/2020 - 05/30/2020 Radiation Therapy   Radiation Treatment Dates: 05/11/2020 through 05/30/2020 Site Technique Total Dose (Gy) Dose per Fx (Gy) Completed Fx Beam Energies  T_L_S Spine_+L_hip 3D 35 2.5 14 10X, 15X     05/12/2020 Pathology Results   A. 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  - See comment.   COMMENT:  The carcinoma with immunohistochemistry is positive with cytokeratin 5/6, p63 and p40 and shows diffuse strong positivity with p16.  The carcinoma is negative with CD56, chromogranin and synaptophysin.  The  immunophenotype is consistent with squamous cell carcinoma   06/27/2020 - 12/07/2020 Chemotherapy   Patient is on Treatment Plan : HEAD/NECK Pembrolizumab + Carboplatin + 5FU q21d x 6 cycles / Pembrolizumab q21d      08/28/2020 - 08/30/2020 Hospital Admission   He was hospitalized for COVID-19 infection   09/15/2020 PET scan   1. Mixed response to therapy. 2. Marked improvement  in skeletal metastasis with near resolution of metabolic activity of previous multifocal skeletal metastasis. Sclerotic lesions remain in the underlying bone. 3. Marked improvement in solitary hypermetabolic hepatic metastasis reduced in metabolic activity and size. 4. Two new lesions in the RIGHT lung which are hypermetabolic. One RIGHT upper lobe nodule and a new focus of hypermetabolic nodular consolidation in the RIGHT infrahilar lower lobe. 5. Improvement in ground-glass nodularity in the RIGHT upper lobe suggest resolved pulmonary infection.     09/17/2020 Imaging   1. No evidence of pulmonary emboli. 2. Rapid increase in large RIGHT LOWER lobe masslike consolidation since 01/74/9449 almost certainly related to infection/pneumonia given significant change over  2 days. Component of underlying malignancy is not excluded however. New small RIGHT pleural effusion. 3. Unchanged 12 mm RIGHT middle lobe nodule and sclerotic lesions within bilateral ribs, proximal RIGHT humerus, sternum and thoracic spine compatible with metastatic disease.   09/17/2020 - 09/23/2020 Hospital Admission   He was hospitalized for community-acquired pneumonia   10/02/2020 - 10/07/2020 Hospital Admission   -Patient presents to ED with complaints of chest pain, findings significant for worsening pneumonia despite being on appropriate antibiotics, as well as significant for pulmonary nodule for which she was seen by pulmonary.  He completed course of IV antibiotics.  Hospitalization complicated by uncontrolled type 2 diabetes with hyperglycemia.  Was switched to metformin and glipizide with improvement of his hyperglycemia.   10/03/2020 Imaging   No evidence of pulmonary embolism.   Patchy right lower lobe opacity remains suspicious for pneumonia. Additional mild right upper lobe infection/pneumonia. Small bilateral pleural effusions, progressive on the left.   Two right lung nodules are mildly progressive from recent CT,  suspicious for metastatic disease.   Mildly progressive soft tissue component associated with left anterior 4th rib metastasis. Additional multifocal osseous metastases are unchanged.   10/26/2020 Imaging   1. Mildly motion degraded exam. 2. Large volume left-sided pulmonary embolism with evidence of right heart strain. (RV/LV Ratio = 1.2) consistent with at least submassive (intermediate risk) PE. The presence of right heart strain has been associated with an increased risk of morbidity and mortality. Please refer to the "PE Focused" order set in EPIC. 3. Shifting right-sided pulmonary opacities, most consistent with infection or aspiration. 4. Pulmonary nodules, felt to be slightly progressive and indicative of metastatic disease. 5. Widespread osseous metastasis, mildly progressive as above. 6. Coronary artery atherosclerosis. Aortic Atherosclerosis (ICD10-I70.0).     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  Patient was started on IV heparin.  Pulmonology and IR consulted and did not feel thrombectomy/thrombolysis was indicated.  Hematology recommended discharge on subcu Lovenox with a plan to transition to [facility] outpatient.   12/23/2020 PET scan   IMPRESSION: 1. Considerable progression multi organ hypermetabolic metastasis. 2. Interval increase in size and new RIGHT lung pulmonary metastasis and LEFT hilar metastasis. 3. New and increased RIGHT hepatic lobe metastasis. 4. New and increased skeletal  metastasis with soft tissue expansion of several lesions.   01/11/2021 Cancer Staging   Staging form: Pharynx - HPV-Mediated Oropharynx, AJCC 8th Edition - Clinical stage from 01/11/2021: Stage IV (rcT2, cN1, pM1, p16+) - Signed by Heath Lark, MD on 01/11/2021 Stage prefix: Recurrence Laterality: Right    01/19/2021 Imaging   1. Progression of metastatic disease involving the lungs, liver, thoracic lymph nodes, and bones, compared to 12/21/2020.  2. Increased consolidative opacities in the infrahilar right lower lobe and medial right middle lobe compatible with airspace disease/pneumonia.  3. Large amount of stool throughout the colon.    01/25/2021 Procedure   Title of Procedure:  Percutaneous Ultrasound-Guided Biopsy   Operative Findings:    Percutaneous image-guided biopsy of 2 cm right segment 6 liver mass.  01/25/2021 Pathology Results   METASTATIC SQUAMOUS CELL CARCINOMA INVOLVING LIVER PARENCHYMA   02/03/2021 - 02/03/2021 Chemotherapy   Patient is on Treatment Plan : HEAD/NECK Cetuximab q14d       PHYSICAL EXAMINATION: ECOG PERFORMANCE STATUS: 1 - Symptomatic but completely ambulatory  Vitals:   02/01/21 1013  BP: 99/72  Pulse: (!) 112  Resp: 18  Temp: 99.2 F (37.3 C)  SpO2: 100%   Filed Weights   02/01/21 1013  Weight: 133 lb 9.6 oz (60.6 kg)    GENERAL:alert, no distress and comfortable NEURO: alert & oriented x 3 with fluent speech, no focal motor/sensory deficits  LABORATORY DATA:  I have reviewed the data as listed    Component Value Date/Time   NA 131 (L) 02/01/2021 1120   K 4.1 02/01/2021 1120   CL 95 (L) 02/01/2021 1120   CO2 26 02/01/2021 1120   GLUCOSE 178 (H) 02/01/2021 1120   BUN 10 02/01/2021 1120   CREATININE 0.77 02/01/2021 1120   CREATININE 0.62 12/28/2020 1107   CALCIUM 9.5 02/01/2021 1120   PROT 7.5 02/01/2021 1120   ALBUMIN 3.0 (L) 02/01/2021 1120   AST 38 02/01/2021 1120   AST 23 12/28/2020 1107   ALT 12 02/01/2021 1120    ALT 14 12/28/2020 1107   ALKPHOS 158 (H) 02/01/2021 1120   BILITOT 0.3 02/01/2021 1120   BILITOT 0.2 (L) 12/28/2020 1107   GFRNONAA >60 02/01/2021 1120   GFRNONAA >60 12/28/2020 1107   GFRAA >60 11/12/2019 1144    No results found for: SPEP, UPEP  Lab Results  Component Value Date   WBC 6.9 02/01/2021   NEUTROABS 5.4 02/01/2021   HGB 7.5 (L) 02/01/2021   HCT 23.9 (L) 02/01/2021   MCV 99.2 02/01/2021   PLT 232 02/01/2021      Chemistry      Component Value Date/Time   NA 131 (L) 02/01/2021 1120   K 4.1 02/01/2021 1120   CL 95 (L) 02/01/2021 1120   CO2 26 02/01/2021 1120   BUN 10 02/01/2021 1120   CREATININE 0.77 02/01/2021 1120   CREATININE 0.62 12/28/2020 1107      Component Value Date/Time   CALCIUM 9.5 02/01/2021 1120   ALKPHOS 158 (H) 02/01/2021 1120   AST 38 02/01/2021 1120   AST 23 12/28/2020 1107   ALT 12 02/01/2021 1120   ALT 14 12/28/2020 1107   BILITOT 0.3 02/01/2021 1120   BILITOT 0.2 (L) 12/28/2020 1107

## 2021-02-02 ENCOUNTER — Other Ambulatory Visit: Payer: Self-pay

## 2021-02-02 ENCOUNTER — Inpatient Hospital Stay: Payer: Managed Care, Other (non HMO)

## 2021-02-02 DIAGNOSIS — C7951 Secondary malignant neoplasm of bone: Secondary | ICD-10-CM

## 2021-02-02 DIAGNOSIS — C01 Malignant neoplasm of base of tongue: Secondary | ICD-10-CM

## 2021-02-02 DIAGNOSIS — T451X5A Adverse effect of antineoplastic and immunosuppressive drugs, initial encounter: Secondary | ICD-10-CM

## 2021-02-02 LAB — PREPARE RBC (CROSSMATCH)

## 2021-02-02 MED ORDER — HEPARIN SOD (PORK) LOCK FLUSH 100 UNIT/ML IV SOLN
500.0000 [IU] | Freq: Every day | INTRAVENOUS | Status: AC | PRN
  Administered 2021-02-02: 15:00:00 500 [IU]

## 2021-02-02 MED ORDER — SODIUM CHLORIDE 0.9% FLUSH
10.0000 mL | INTRAVENOUS | Status: AC | PRN
  Administered 2021-02-02: 15:00:00 10 mL

## 2021-02-02 MED ORDER — SODIUM CHLORIDE 0.9% IV SOLUTION
250.0000 mL | Freq: Once | INTRAVENOUS | Status: AC
  Administered 2021-02-02: 12:00:00 250 mL via INTRAVENOUS

## 2021-02-02 MED ORDER — ACETAMINOPHEN 325 MG PO TABS
650.0000 mg | ORAL_TABLET | Freq: Once | ORAL | Status: AC
  Administered 2021-02-02: 12:00:00 650 mg via ORAL
  Filled 2021-02-02: qty 2

## 2021-02-02 MED ORDER — DIPHENHYDRAMINE HCL 25 MG PO CAPS
25.0000 mg | ORAL_CAPSULE | Freq: Once | ORAL | Status: AC
  Administered 2021-02-02: 12:00:00 25 mg via ORAL
  Filled 2021-02-02: qty 1

## 2021-02-02 NOTE — Patient Instructions (Signed)
Blood Transfusion, Adult A blood transfusion is a procedure in which you receive blood or a type of blood cell (blood component) through an IV. You may need a blood transfusion when your blood level is low. This may result from a bleeding disorder, illness, injury, or surgery. The blood may come from a donor. You may also be able to donate blood for yourself (autologous blood donation) before a planned surgery. The blood given in a transfusion is made up of different blood components. You may receive: Red blood cells. These carry oxygen to the cells in the body. Platelets. These help your blood to clot. Plasma. This is the liquid part of your blood. It carries proteins and other substances throughout the body. White blood cells. These help you fight infections. If you have hemophilia or another clotting disorder, you may also receive other types of blood products. Tell a health care provider about: Any blood disorders you have. Any previous reactions you have had during a blood transfusion. Any allergies you have. All medicines you are taking, including vitamins, herbs, eye drops, creams, and over-the-counter medicines. Any surgeries you have had. Any medical conditions you have, including any recent fever or cold symptoms. Whether you are pregnant or may be pregnant. What are the risks? Generally, this is a safe procedure. However, problems may occur. The most common problems include: A mild allergic reaction, such as red, swollen areas of skin (hives) and itching. Fever or chills. This may be the body's response to new blood cells received. This may occur during or up to 4 hours after the transfusion. More serious problems may include: Transfusion-associated circulatory overload (TACO), or too much fluid in the lungs. This may cause breathing problems. A serious allergic reaction, such as difficulty breathing or swelling around the face and lips. Transfusion-related acute lung injury  (TRALI), which causes breathing difficulty and low oxygen in the blood. This can occur within hours of the transfusion or several days later. Iron overload. This can happen after receiving many blood transfusions over a period of time. Infection or virus being transmitted. This is rare because donated blood is carefully tested before it is given. Hemolytic transfusion reaction. This is rare. It happens when your body's defense system (immune system)tries to attack the new blood cells. Symptoms may include fever, chills, nausea, low blood pressure, and low back or chest pain. Transfusion-associated graft-versus-host disease (TAGVHD). This is rare. It happens when donated cells attack your body's healthy tissues. What happens before the procedure? Medicines Ask your health care provider about: Changing or stopping your regular medicines. This is especially important if you are taking diabetes medicines or blood thinners. Taking medicines such as aspirin and ibuprofen. These medicines can thin your blood. Do not take these medicines unless your health care provider tells you to take them. Taking over-the-counter medicines, vitamins, herbs, and supplements. General instructions Follow instructions from your health care provider about eating and drinking restrictions. You will have a blood test to determine your blood type. This is necessary to know what kind of blood your body will accept and to match it to the donor blood. If you are going to have a planned surgery, you may be able to do an autologous blood donation. This may be done in case you need to have a transfusion. You will have your temperature, blood pressure, and pulse monitored before the transfusion. If you have had an allergic reaction to a transfusion in the past, you may be given medicine to help prevent   a reaction. This medicine may be given to you by mouth (orally) or through an IV. Set aside time for the blood transfusion. This  procedure generally takes 1-4 hours to complete. What happens during the procedure?  An IV will be inserted into one of your veins. The bag of donated blood will be attached to your IV. The blood will then enter through your vein. Your temperature, blood pressure, and pulse will be monitored regularly during the transfusion. This monitoring is done to detect early signs of a transfusion reaction. Tell your nurse right away if you have any of these symptoms during the transfusion: Shortness of breath or trouble breathing. Chest or back pain. Fever or chills. Hives or itching. If you have any signs or symptoms of a reaction, your transfusion will be stopped and you may be given medicine. When the transfusion is complete, your IV will be removed. Pressure may be applied to the IV site for a few minutes. A bandage (dressing)will be applied. The procedure may vary among health care providers and hospitals. What happens after the procedure? Your temperature, blood pressure, pulse, breathing rate, and blood oxygen level will be monitored until you leave the hospital or clinic. Your blood may be tested to see how you are responding to the transfusion. You may be warmed with fluids or blankets to maintain a normal body temperature. If you receive your blood transfusion in an outpatient setting, you will be told whom to contact to report any reactions. Where to find more information For more information on blood transfusions, visit the [employer]: redcross.org Summary A blood transfusion is a procedure in which you receive blood or a type of blood cell (blood component) through an IV. The blood you receive may come from a donor or be donated by yourself (autologous blood donation) before a planned surgery. The blood given in a transfusion is made up of different blood components. You may receive red blood cells, platelets, plasma, or white blood cells depending on the condition treated. Your  temperature, blood pressure, and pulse will be monitored before, during, and after the transfusion. After the transfusion, your blood may be tested to see how your body has responded. This information is not intended to replace advice given to you by your health care provider. Make sure you discuss any questions you have with your health care provider. Document Revised: 01/08/2019 Document Reviewed: 08/28/2018 [employer] Patient Education  2022 [employer]  

## 2021-02-03 LAB — BPAM RBC
Blood Product Expiration Date: 202212162359
ISSUE DATE / TIME: 202211171238
Unit Type and Rh: 5100

## 2021-02-03 LAB — TYPE AND SCREEN
ABO/RH(D): O POS
Antibody Screen: NEGATIVE
Unit division: 0

## 2021-02-03 LAB — ERYTHROPOIETIN: Erythropoietin: 94.4 m[IU]/mL — ABNORMAL HIGH (ref 2.6–18.5)

## 2021-02-06 ENCOUNTER — Encounter: Payer: Self-pay | Admitting: Hematology and Oncology

## 2021-02-06 ENCOUNTER — Telehealth: Payer: Self-pay

## 2021-02-06 ENCOUNTER — Other Ambulatory Visit (HOSPITAL_COMMUNITY): Payer: Self-pay

## 2021-02-06 ENCOUNTER — Inpatient Hospital Stay (HOSPITAL_BASED_OUTPATIENT_CLINIC_OR_DEPARTMENT_OTHER): Payer: Managed Care, Other (non HMO) | Admitting: Hematology and Oncology

## 2021-02-06 ENCOUNTER — Other Ambulatory Visit: Payer: Self-pay

## 2021-02-06 VITALS — BP 143/90 | HR 112 | Temp 97.5°F | Resp 18 | Ht 69.0 in | Wt 132.0 lb

## 2021-02-06 DIAGNOSIS — G893 Neoplasm related pain (acute) (chronic): Secondary | ICD-10-CM

## 2021-02-06 DIAGNOSIS — C01 Malignant neoplasm of base of tongue: Secondary | ICD-10-CM | POA: Diagnosis not present

## 2021-02-06 DIAGNOSIS — I2609 Other pulmonary embolism with acute cor pulmonale: Secondary | ICD-10-CM

## 2021-02-06 DIAGNOSIS — C7951 Secondary malignant neoplasm of bone: Secondary | ICD-10-CM | POA: Diagnosis not present

## 2021-02-06 DIAGNOSIS — R634 Abnormal weight loss: Secondary | ICD-10-CM

## 2021-02-06 DIAGNOSIS — K5909 Other constipation: Secondary | ICD-10-CM

## 2021-02-06 DIAGNOSIS — Z7189 Other specified counseling: Secondary | ICD-10-CM

## 2021-02-06 MED ORDER — LACTULOSE 10 GM/15ML PO SOLN
20.0000 g | Freq: Three times a day (TID) | ORAL | 3 refills | Status: AC
  Filled 2021-02-06: qty 473, 5d supply, fill #0

## 2021-02-06 MED ORDER — DRONABINOL 5 MG PO CAPS
5.0000 mg | ORAL_CAPSULE | Freq: Two times a day (BID) | ORAL | 0 refills | Status: DC
Start: 2021-02-06 — End: 2021-02-08
  Filled 2021-02-06 – 2021-02-08 (×2): qty 60, 30d supply, fill #0

## 2021-02-06 NOTE — Assessment & Plan Note (Addendum)
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

## 2021-02-06 NOTE — Assessment & Plan Note (Signed)
He continues to have severe constipation with no bowel movement for 2 weeks He will continue Senokot daily but I recommend he increase the dose to 2 pills 3 times a day He will increase MiraLAX to twice daily I will add lactulose 3 times a day as well

## 2021-02-06 NOTE — Assessment & Plan Note (Signed)
His appetite is poor He has no benefit so far from Megace, Remeron or dexamethasone He would like to try Marinol and I think is reasonable

## 2021-02-06 NOTE — Progress Notes (Signed)
[place] OFFICE PROGRESS NOTE  Patient Care Team: Benay Pike, MD as PCP - General (Hematology and Oncology) [name], MD as Consulting Physician (Otolaryngology) [name], MD as Attending Physician (Radiation Oncology) Leota Sauers, RN (Inactive) as Registered Nurse (Oncology) Wynelle Beckmann, Melodie Bouillon, PT as Physical Therapist (Physical Therapy) Sharen Counter, CCC-SLP as Speech Language Pathologist (Speech Pathology) Karie Mainland, RD as Dietitian (Nutrition) Malmfelt, Stephani Police, RN as Oncology Nurse Navigator (Oncology)  ASSESSMENT & PLAN:  Malignant neoplasm of base of tongue ([place]) I have a very long discussion with the patient and his wife The patient has excepted the terminal nature of his disease He has made informed decision not to proceed with palliative chemotherapy and would like to only focus on supportive care He is interested for hospice referral and we will set that up We discussed the risk and benefits of blood transfusion support and he is undecided At this point in time, I have not made return appointment for him to come back unless he wants to resume blood transfusion support  Metastasis to bone [facility]) His bone marrow disease is causing severe anemia He had received blood transfusion support recently His pain is reasonably controlled I would not recommend we proceed with Zometa at this point  Pulmonary embolism [facility]) He will continue anticoagulation therapy but would be at risk of future bleeding due to progressive liver disease For now, he will continue Eliquis  Cancer related pain I suspect his pain medicine might have caused some of the confusion and hallucination His pain is well controlled right now The addition of dexamethasone was not helpful and I recommend discontinuation of dexamethasone  Other constipation He continues to have severe constipation with no bowel movement for 2 weeks He will continue Senokot daily  but I recommend he increase the dose to 2 pills 3 times a day He will increase MiraLAX to twice daily I will add lactulose 3 times a day as well  Weight loss, unintentional His appetite is poor He has no benefit so far from Megace, Remeron or dexamethasone He would like to try Marinol and I think is reasonable  Goals of care, counseling/discussion We had extensive discussions about goals of care The patient has made informed decision to pursue palliative care with hospice service We discussed prognosis, estimated to be less than 6 months We discussed the MOST form for him to consider other types of support including transfusion support I gave him a DNR order I will make referral for hospice service  Orders Placed This Encounter  Procedures   Ambulatory referral to Hospice    Referral Priority:   Routine    Referral Type:   Consultation    Referral Reason:   Specialty Services Required    Requested Specialty:   Hospice Services    Number of Visits Requested:   1    All questions were answered. The patient knows to call the clinic with any problems, questions or concerns. The total time spent in the appointment was 55 minutes encounter with patients including review of chart and various tests results, discussions about plan of care and coordination of care plan   Heath Lark, MD 02/06/2021 1:59 PM  INTERVAL HISTORY: Please see below for problem oriented charting. he returns for follow-up on discussion from last week His wife provided a lot of additional information today regarding recent confusion and hallucination He is having hallucination during daytime He has increased frequency of hiccups He did not  benefit from blood transfusion and continues to complain of fatigue Denies recent bleeding He has no bowel movement for 2 weeks despite taking MiraLAX daily and Senokot twice daily He continues to have very poor appetite; recent addition of dexamethasone was not helpful His  pain is reasonably controlled  REVIEW OF SYSTEMS:   Constitutional: Denies fevers, chills  Eyes: Denies blurriness of vision Ears, nose, mouth, throat, and face: Denies mucositis or sore throat Respiratory: Denies cough, dyspnea or wheezes Cardiovascular: Denies palpitation, chest discomfort or lower extremity swelling Skin: Denies abnormal skin rashes Lymphatics: Denies new lymphadenopathy or easy bruising All other systems were reviewed with the patient and are negative.  I have reviewed the past medical history, past surgical history, social history and family history with the patient and they are unchanged from previous note.  ALLERGIES:  is allergic to penicillins.  MEDICATIONS:  Current Outpatient Medications  Medication Sig Dispense Refill   dronabinol (MARINOL) 5 MG capsule Take 1 capsule (5 mg total) by mouth 2 (two) times daily before a meal. 60 capsule 0   lactulose (CHRONULAC) 10 GM/15ML solution Take 30 mLs (20 g total) by mouth 3 (three) times daily. 473 mL 3   apixaban (ELIQUIS) 5 MG TABS tablet Take 1 tablet (5 mg total) by mouth 2 (two) times daily. 60 tablet 11   cholecalciferol (VITAMIN D3) 25 MCG (1000 UNIT) tablet Take 2,000 Units by mouth daily.     morphine (MS CONTIN) 15 MG 12 hr tablet Take 1 tablet (15 mg total) by mouth every 12 (twelve) hours. 60 tablet 0   ondansetron (ZOFRAN ODT) 8 MG disintegrating tablet Take 1 tablet (8 mg total) by mouth every 8 (eight) hours as needed for nausea or vomiting. (Patient taking differently: Take 8 mg by mouth every 8 (eight) hours as needed for nausea or vomiting (dissolve orally).) 30 tablet 3   oxycodone (ROXICODONE) 30 MG immediate release tablet Take 1 tablet (30 mg total) by mouth every 4 (four) hours as needed for severe pain. 60 tablet 0   polyethylene glycol (MIRALAX / GLYCOLAX) 17 g packet Take 17 g by mouth daily.     prochlorperazine (COMPAZINE) 10 MG tablet Take 10 mg by mouth every 6 (six) hours as needed for  nausea or vomiting.     senna (SENOKOT) 8.6 MG TABS tablet Take 1 tablet (8.6 mg total) by mouth 2 (two) times daily. 120 tablet 0   No current facility-administered medications for this visit.    SUMMARY OF ONCOLOGIC HISTORY: Oncology History  Malignant neoplasm of base of tongue ([place])  04/24/2018 Imaging   CT neck w/ contrast: 1. Right level 2 necrotic nodal mass and level 3 necrotic lymph node are highly concerning for nodal metastasis from head and neck primary neoplasm likely oropharynx or the oral cavity. However, evaluation for primary neoplasm in these regions is markedly limited due to streak artifacts from dental amalgam and closed airway possibly secondary to patient's holding breath/swallowing. Recommend direct visual inspection and possibly PET CT scan for further evaluation.  2. Mild asymmetry and fullness of the right nasopharyngeal region, nonspecific.  3. 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   05/07/2018 Procedure   FNA of the right cervical LN   05/07/2018 Pathology Results   ACCESSION NUMBER: [id number]  Right IJ chain lymph node, FNA Squamous cell carcinoma, metastatic; p16+    05/28/2018 Imaging   PET: IMPRESSION: 1. Asymmetric hypermetabolic activity in the RIGHT lingular tonsil/base of tongue region  favored primary carcinoma. 2. Two hypermetabolic RIGHT level II metastatic lymph nodes. 3. No LEFT cervical lymphadenopathy.  No distant metastatic disease.   06/02/2018 Initial Diagnosis   In summary, patient presents with Dr. Constance Holster of ENT in late 03/2018 for evaluation of an enlarging right neck mass.  CT neck showed necrotic right Level II and III necrotic LN's, possibly from oropharyngeal oral cavity primary, but the study was limited due to streak artifacts.  He underwent FNA of the R Level II LN, which showed squamous cell carcinoma, p16+.  PET in 05/2018 showed asymmetric FDG uptake in the right tonsil/base of the tongue, likely the primary  malignancy.  In addition, there were two FDG-avid R Level II LN's without evidence of contralateral cervical LN or metastatic disease.  Given the relatively low volume disease, Dr Maylon Peppers recommended upfront surgery, followed by adjuvant therapy based on the final pathology. He had TORS on 06/16/2018.  Pathology from the procedure showed: invasive p16-positive (HPV-related) squamous cell carcinoma, 1.4 cm, negative margins. Out of 24 biopsied lymph nodes, only one was positive for metastatic carcinoma (1/24).  This node was 4.5cm but with no extracapsular extension.  No LVSI and no PNI.  He underwent right neck dissection but not left neck dissection.  At postop follow up, they agreed to proceed with observation and reserve radiation therapy for any possible future recurrence.   At follow up on 04/07/2019, Dr. [name] noted a new suspicious left upper neck lymph node. Neck CT performed that day revealed: new heterogeneous 2.2 cm contralateral left level 2a lymph node suspicious for contralateral nodal metastasis; indeterminate mild interval increase in size of several left level 2a and 2b lymph nodes.   PET scan performed on 04/16/2019 showed: single hypermetabolic left cervical lymph node; mild asymmetric FDG uptake in left glossotonsillar sulcus; focal FDG uptake in L1 vertebral body without CT correlate; otherwise, no malignant-range FDG uptake elsewhere.  He underwent left tonsillectomy and left neck dissection on 05/04/2019 with pathology revealing: benign left tonsil. Out of a total of 25 biopsied lymph nodes, only one was positive for metastatic carcinoma but negative for extracapsular extension and positive for p16.  He was advised adjuvant radiation at this time. He completed radiation on 07/24/2019.   05/29/2019 Cancer Staging   Staging form: Pharynx - HPV-Mediated Oropharynx, AJCC 8th Edition - Pathologic stage from 05/29/2019: Stage I (rpT0, pN1, cM0, p16+) - Signed by [name], MD on  05/29/2019    11/07/2019 Imaging   1. Patchy ground-glass predominantly at the LEFT lung base, mild bronchial wall thickening in areas subtending affected areas of lung with ground-glass nodule in the RIGHT chest without bronchial wall thickening. Findings may be indicative of pneumonitis, perhaps from aspiration in this patient with head and neck cancer. Suggest short interval follow-up to ensure resolution, within 8-12 weeks. 2. Stranding in the low neck better demonstrated on the neck CT acquired on the same date compatible with post treatment changes, incompletely imaged. 3. Calcified granulomatous changes in the RIGHT hilum and RIGHT middle lobe. 4. Aortic atherosclerosis.   Aortic Atherosclerosis (ICD10-I70.0).   02/17/2020 Imaging   1. Near complete resolution of ground-glass nodule in the lower lobes consistent resolving infectious or inflammatory process. 2. Single small new pulmonary nodule in the LEFT lower lobe. Recommend follow-up CT in 3 to 6 months.   05/04/2020 PET scan   1. Widespread hypermetabolic bone metastases. 2. 3.5 cm necrotic hypermetabolic metastatic lesion in the right liver. 3. Hypermetabolic uptake in the right hilum  associated with 2 hypermetabolic left lung nodules. Imaging appearance is concerning for metastatic disease. 4. Patchy/nodular ground-glass opacity in the right lung having a tree-in-bud configuration. This would be an atypical appearance for metastatic disease and infectious etiology is favored, potentially atypical. 5.  Aortic Atherosclerois (ICD10-170.0)   05/11/2020 - 05/30/2020 Radiation Therapy   Radiation Treatment Dates: 05/11/2020 through 05/30/2020 Site Technique Total Dose (Gy) Dose per Fx (Gy) Completed Fx Beam Energies  T_L_S Spine_+L_hip 3D 35 2.5 14 10X, 15X     05/12/2020 Pathology Results   A. 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  - See comment.   COMMENT:  The carcinoma  with immunohistochemistry is positive with cytokeratin 5/6, p63 and p40 and shows diffuse strong positivity with p16.  The carcinoma is negative with CD56, chromogranin and synaptophysin.  The  immunophenotype is consistent with squamous cell carcinoma   06/27/2020 - 12/07/2020 Chemotherapy   Patient is on Treatment Plan : HEAD/NECK Pembrolizumab + Carboplatin + 5FU q21d x 6 cycles / Pembrolizumab q21d      08/28/2020 - 08/30/2020 Hospital Admission   He was hospitalized for COVID-19 infection   09/15/2020 PET scan   1. Mixed response to therapy. 2. Marked improvement in skeletal metastasis with near resolution of metabolic activity of previous multifocal skeletal metastasis. Sclerotic lesions remain in the underlying bone. 3. Marked improvement in solitary hypermetabolic hepatic metastasis reduced in metabolic activity and size. 4. Two new lesions in the RIGHT lung which are hypermetabolic. One RIGHT upper lobe nodule and a new focus of hypermetabolic nodular consolidation in the RIGHT infrahilar lower lobe. 5. Improvement in ground-glass nodularity in the RIGHT upper lobe suggest resolved pulmonary infection.     09/17/2020 Imaging   1. No evidence of pulmonary emboli. 2. Rapid increase in large RIGHT LOWER lobe masslike consolidation since 43/15/4008 almost certainly related to infection/pneumonia given significant change over 2 days. Component of underlying malignancy is not excluded however. New small RIGHT pleural effusion. 3. Unchanged 12 mm RIGHT middle lobe nodule and sclerotic lesions within bilateral ribs, proximal RIGHT humerus, sternum and thoracic spine compatible with metastatic disease.   09/17/2020 - 09/23/2020 Hospital Admission   He was hospitalized for community-acquired pneumonia   10/02/2020 - 10/07/2020 Hospital Admission   -Patient presents to ED with complaints of chest pain, findings significant for worsening pneumonia despite being on appropriate antibiotics, as well as  significant for pulmonary nodule for which she was seen by pulmonary.  He completed course of IV antibiotics.  Hospitalization complicated by uncontrolled type 2 diabetes with hyperglycemia.  Was switched to metformin and glipizide with improvement of his hyperglycemia.   10/03/2020 Imaging   No evidence of pulmonary embolism.   Patchy right lower lobe opacity remains suspicious for pneumonia. Additional mild right upper lobe infection/pneumonia. Small bilateral pleural effusions, progressive on the left.   Two right lung nodules are mildly progressive from recent CT, suspicious for metastatic disease.   Mildly progressive soft tissue component associated with left anterior 4th rib metastasis. Additional multifocal osseous metastases are unchanged.   10/26/2020 Imaging   1. Mildly motion degraded exam. 2. Large volume left-sided pulmonary embolism with evidence of right heart strain. (RV/LV Ratio = 1.2) consistent with at least submassive (intermediate risk) PE. The presence of right heart strain has been associated with an increased risk of morbidity and mortality. Please refer to the "PE Focused" order set in EPIC. 3. Shifting right-sided pulmonary opacities, most consistent with infection  or aspiration. 4. Pulmonary nodules, felt to be slightly progressive and indicative of metastatic disease. 5. Widespread osseous metastasis, mildly progressive as above. 6. Coronary artery atherosclerosis. Aortic Atherosclerosis (ICD10-I70.0).     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  Patient was started on IV heparin.  Pulmonology and IR consulted and did not feel thrombectomy/thrombolysis was indicated.  Hematology recommended discharge on subcu Lovenox with a plan to transition to [facility] outpatient.   12/23/2020 PET scan   IMPRESSION: 1. Considerable progression multi organ hypermetabolic metastasis. 2. Interval increase in size and new RIGHT lung pulmonary metastasis and LEFT hilar metastasis. 3. New and increased RIGHT hepatic lobe metastasis. 4. New and increased skeletal metastasis with soft tissue expansion of several lesions.   01/11/2021 Cancer Staging   Staging form: Pharynx - HPV-Mediated Oropharynx, AJCC 8th Edition - Clinical stage from 01/11/2021: Stage IV (rcT2, cN1, pM1, p16+) - Signed by Heath Lark, MD on 01/11/2021 Stage prefix: Recurrence Laterality: Right    01/19/2021 Imaging   1. Progression of metastatic disease involving the lungs, liver, thoracic lymph nodes, and bones, compared to 12/21/2020.  2. Increased consolidative opacities in the infrahilar right lower lobe and medial right middle lobe compatible with airspace disease/pneumonia.  3. Large amount of stool throughout the colon.    01/25/2021 Procedure   Title of Procedure:  Percutaneous Ultrasound-Guided Biopsy   Operative Findings:    Percutaneous image-guided biopsy of 2 cm right segment 6 liver mass.    01/25/2021 Pathology Results   METASTATIC SQUAMOUS CELL CARCINOMA INVOLVING LIVER PARENCHYMA   02/03/2021 - 02/03/2021 Chemotherapy   Patient is on Treatment Plan : HEAD/NECK Cetuximab q14d       PHYSICAL EXAMINATION: ECOG PERFORMANCE STATUS: 2 - Symptomatic, <50% confined to bed  Vitals:   02/06/21 1315  BP: (!) 143/90  Pulse: (!) 112  Resp: 18  Temp: (!) 97.5 F (36.4 C)  SpO2: 100%   Filed Weights   02/06/21 1315  Weight: 132 lb (59.9 kg)    GENERAL:alert, no distress and comfortable NEURO: alert &  oriented x 3 with fluent speech, no focal motor/sensory deficits  LABORATORY DATA:  I have reviewed the data as listed    Component Value Date/Time   NA 131 (L) 02/01/2021 1120   K 4.1 02/01/2021 1120   CL 95 (L) 02/01/2021 1120   CO2 26 02/01/2021 1120   GLUCOSE 178 (H) 02/01/2021 1120   BUN 10 02/01/2021 1120   CREATININE 0.77 02/01/2021 1120   CREATININE 0.62 12/28/2020 1107   CALCIUM 9.5 02/01/2021 1120   PROT 7.5 02/01/2021 1120   ALBUMIN 3.0 (L) 02/01/2021 1120   AST 38 02/01/2021 1120   AST 23 12/28/2020 1107   ALT 12 02/01/2021 1120   ALT 14 12/28/2020 1107   ALKPHOS 158 (H) 02/01/2021 1120   BILITOT 0.3 02/01/2021 1120   BILITOT 0.2 (L) 12/28/2020 1107   GFRNONAA >60 02/01/2021 1120   GFRNONAA >60 12/28/2020 1107   GFRAA >60 11/12/2019 1144    No results found for: SPEP,  UPEP  Lab Results  Component Value Date   WBC 6.9 02/01/2021   NEUTROABS 5.4 02/01/2021   HGB 7.5 (L) 02/01/2021   HCT 23.9 (L) 02/01/2021   MCV 99.2 02/01/2021   PLT 232 02/01/2021      Chemistry      Component Value Date/Time   NA 131 (L) 02/01/2021 1120   K 4.1 02/01/2021 1120   CL 95 (L) 02/01/2021 1120   CO2 26 02/01/2021 1120   BUN 10 02/01/2021 1120   CREATININE 0.77 02/01/2021 1120   CREATININE 0.62 12/28/2020 1107      Component Value Date/Time   CALCIUM 9.5 02/01/2021 1120   ALKPHOS 158 (H) 02/01/2021 1120   AST 38 02/01/2021 1120   AST 23 12/28/2020 1107   ALT 12 02/01/2021 1120   ALT 14 12/28/2020 1107   BILITOT 0.3 02/01/2021 1120   BILITOT 0.2 (L) 12/28/2020 1107

## 2021-02-06 NOTE — Assessment & Plan Note (Signed)
He will continue anticoagulation therapy but would be at risk of future bleeding due to progressive liver disease For now, he will continue Eliquis

## 2021-02-06 NOTE — Assessment & Plan Note (Signed)
We had extensive discussions about goals of care The patient has made informed decision to pursue palliative care with hospice service We discussed prognosis, estimated to be less than 6 months We discussed the MOST form for him to consider other types of support including transfusion support I gave him a DNR order I will make referral for hospice service

## 2021-02-06 NOTE — Assessment & Plan Note (Signed)
I have a very long discussion with the patient and his wife The patient has excepted the terminal nature of his disease He has made informed decision not to proceed with palliative chemotherapy and would like to only focus on supportive care He is interested for hospice referral and we will set that up We discussed the risk and benefits of blood transfusion support and he is undecided At this point in time, I have not made return appointment for him to come back unless he wants to resume blood transfusion support

## 2021-02-06 NOTE — Assessment & Plan Note (Signed)
I suspect his pain medicine might have caused some of the confusion and hallucination His pain is well controlled right now The addition of dexamethasone was not helpful and I recommend discontinuation of dexamethasone

## 2021-02-06 NOTE — Telephone Encounter (Signed)
Spoke with patient's wife concerning recent myChart message. Patient's wife provided further details and information regarding the situation. Reports that patient appears more stable and alert today. She feels that they will be able to keep their afternoon appointment with Dr. Alvy Bimler.  Dr. Alvy Bimler made aware of the situation and additional concerns of the family.

## 2021-02-07 ENCOUNTER — Inpatient Hospital Stay: Payer: Managed Care, Other (non HMO) | Admitting: Hematology and Oncology

## 2021-02-07 ENCOUNTER — Other Ambulatory Visit (HOSPITAL_COMMUNITY): Payer: Self-pay

## 2021-02-08 ENCOUNTER — Other Ambulatory Visit: Payer: Managed Care, Other (non HMO)

## 2021-02-08 ENCOUNTER — Other Ambulatory Visit: Payer: Self-pay | Admitting: Hematology and Oncology

## 2021-02-08 ENCOUNTER — Ambulatory Visit: Payer: Managed Care, Other (non HMO)

## 2021-02-08 ENCOUNTER — Encounter: Payer: Self-pay | Admitting: Hematology and Oncology

## 2021-02-08 ENCOUNTER — Ambulatory Visit: Payer: Managed Care, Other (non HMO) | Admitting: Hematology and Oncology

## 2021-02-08 ENCOUNTER — Other Ambulatory Visit (HOSPITAL_COMMUNITY): Payer: Self-pay

## 2021-02-08 ENCOUNTER — Telehealth: Payer: Self-pay | Admitting: *Deleted

## 2021-02-08 ENCOUNTER — Ambulatory Visit: Payer: Managed Care, Other (non HMO) | Admitting: Physician Assistant

## 2021-02-08 MED ORDER — DRONABINOL 5 MG PO CAPS: 5.0000 mg | ORAL_CAPSULE | Freq: Two times a day (BID) | ORAL | 0 refills | Status: AC

## 2021-02-08 NOTE — Telephone Encounter (Signed)
Received message from patient's wife via MyChart regarding insurance denial for Dronabinol. Per Starla Link, LPN, [place], appeal was submitted to [employer] [facility] via CoverMy Meds 02/07/21. Contacted [facility] [facility] [phone], r/t [id number] for information OF:[id number]. Appeals department # provided [phone]. Fax(es) received in office - one addressed to Dr. Alvy Bimler, and one marked 'copy' of letter sent to patient stating medication denied. Faxed second appeal, marked "EXPEDITE", to fax# [phone] as provided by [facility] staff. Fax confirmation received.  Notified Ms. Bancroft of preceding information. Ms. Budney acknowledged information and asked taht prescription be sent to [facility] - she states she will use a GoodRx coupon there.  Per Dr.Gorsuch, inquired if patient had bowel movement since appt here - Dr. Alvy Bimler sent lactulose prescription. Ms. Pickering states patient had BM on Monday afternoon.  Dr. Alvy Bimler informed of BM, med denial, and Ms. Hardin Negus request  - Dr. Alvy Bimler sent prescription for Dronabinol to [facility]. Ms. Celona informed that prescription sent to [facility] - verbalized understanding.

## 2021-02-15 ENCOUNTER — Other Ambulatory Visit (HOSPITAL_COMMUNITY): Payer: Self-pay

## 2021-04-19 DEATH — deceased
